# Patient Record
Sex: Female | Born: 1935
Health system: Southern US, Community
[De-identification: ages and names within clinical notes are randomized; demographics above are authoritative.]

## PROBLEM LIST (undated history)

## (undated) DIAGNOSIS — E876 Hypokalemia: Secondary | ICD-10-CM

## (undated) DIAGNOSIS — I2510 Atherosclerotic heart disease of native coronary artery without angina pectoris: Secondary | ICD-10-CM

## (undated) DIAGNOSIS — S2249XA Multiple fractures of ribs, unspecified side, initial encounter for closed fracture: Secondary | ICD-10-CM

## (undated) DIAGNOSIS — B029 Zoster without complications: Secondary | ICD-10-CM

## (undated) DIAGNOSIS — I259 Chronic ischemic heart disease, unspecified: Secondary | ICD-10-CM

## (undated) DIAGNOSIS — I071 Rheumatic tricuspid insufficiency: Secondary | ICD-10-CM

## (undated) DIAGNOSIS — E785 Hyperlipidemia, unspecified: Secondary | ICD-10-CM

## (undated) DIAGNOSIS — I6529 Occlusion and stenosis of unspecified carotid artery: Secondary | ICD-10-CM

## (undated) DIAGNOSIS — J439 Emphysema, unspecified: Secondary | ICD-10-CM

## (undated) DIAGNOSIS — F32A Depression, unspecified: Secondary | ICD-10-CM

## (undated) DIAGNOSIS — I4891 Unspecified atrial fibrillation: Secondary | ICD-10-CM

## (undated) DIAGNOSIS — R609 Edema, unspecified: Secondary | ICD-10-CM

## (undated) DIAGNOSIS — M2550 Pain in unspecified joint: Secondary | ICD-10-CM

## (undated) DIAGNOSIS — F419 Anxiety disorder, unspecified: Secondary | ICD-10-CM

## (undated) DIAGNOSIS — I872 Venous insufficiency (chronic) (peripheral): Secondary | ICD-10-CM

## (undated) DIAGNOSIS — D3910 Neoplasm of uncertain behavior of unspecified ovary: Secondary | ICD-10-CM

## (undated) DIAGNOSIS — R002 Palpitations: Secondary | ICD-10-CM

## (undated) DIAGNOSIS — I509 Heart failure, unspecified: Secondary | ICD-10-CM

## (undated) DIAGNOSIS — K8020 Calculus of gallbladder without cholecystitis without obstruction: Secondary | ICD-10-CM

## (undated) DIAGNOSIS — I998 Other disorder of circulatory system: Secondary | ICD-10-CM

## (undated) DIAGNOSIS — S2220XA Unspecified fracture of sternum, initial encounter for closed fracture: Secondary | ICD-10-CM

## (undated) DIAGNOSIS — M48061 Spinal stenosis, lumbar region without neurogenic claudication: Secondary | ICD-10-CM

## (undated) DIAGNOSIS — N183 Chronic kidney disease, stage 3 (moderate): Secondary | ICD-10-CM

## (undated) DIAGNOSIS — I4510 Unspecified right bundle-branch block: Secondary | ICD-10-CM

## (undated) DIAGNOSIS — K210 Gastro-esophageal reflux disease with esophagitis: Secondary | ICD-10-CM

## (undated) DIAGNOSIS — I4892 Unspecified atrial flutter: Secondary | ICD-10-CM

## (undated) DIAGNOSIS — Z85038 Personal history of other malignant neoplasm of large intestine: Secondary | ICD-10-CM

## (undated) DIAGNOSIS — IMO0002 Reserved for concepts with insufficient information to code with codable children: Secondary | ICD-10-CM

## (undated) DIAGNOSIS — F329 Major depressive disorder, single episode, unspecified: Secondary | ICD-10-CM

## (undated) DIAGNOSIS — I10 Essential (primary) hypertension: Secondary | ICD-10-CM

## (undated) DIAGNOSIS — H43813 Vitreous degeneration, bilateral: Secondary | ICD-10-CM

## (undated) DIAGNOSIS — Z9889 Other specified postprocedural states: Secondary | ICD-10-CM

## (undated) DIAGNOSIS — R0600 Dyspnea, unspecified: Secondary | ICD-10-CM

## (undated) DIAGNOSIS — J189 Pneumonia, unspecified organism: Secondary | ICD-10-CM

## (undated) DIAGNOSIS — K5900 Constipation, unspecified: Secondary | ICD-10-CM

## (undated) DIAGNOSIS — D6869 Other thrombophilia: Secondary | ICD-10-CM

## (undated) DIAGNOSIS — I5042 Chronic combined systolic (congestive) and diastolic (congestive) heart failure: Secondary | ICD-10-CM

## (undated) DIAGNOSIS — E039 Hypothyroidism, unspecified: Secondary | ICD-10-CM

## (undated) DIAGNOSIS — D509 Iron deficiency anemia, unspecified: Secondary | ICD-10-CM

## (undated) DIAGNOSIS — I209 Angina pectoris, unspecified: Secondary | ICD-10-CM

## (undated) DIAGNOSIS — M810 Age-related osteoporosis without current pathological fracture: Secondary | ICD-10-CM

## (undated) DIAGNOSIS — R269 Unspecified abnormalities of gait and mobility: Secondary | ICD-10-CM

## (undated) DIAGNOSIS — C50919 Malignant neoplasm of unspecified site of unspecified female breast: Secondary | ICD-10-CM

## (undated) DIAGNOSIS — Z8679 Personal history of other diseases of the circulatory system: Secondary | ICD-10-CM

## (undated) DIAGNOSIS — M1990 Unspecified osteoarthritis, unspecified site: Secondary | ICD-10-CM

## (undated) DIAGNOSIS — K559 Vascular disorder of intestine, unspecified: Secondary | ICD-10-CM

## (undated) DIAGNOSIS — J209 Acute bronchitis, unspecified: Secondary | ICD-10-CM

## (undated) DIAGNOSIS — I700 Atherosclerosis of aorta: Secondary | ICD-10-CM

## (undated) DIAGNOSIS — R5383 Other fatigue: Secondary | ICD-10-CM

## (undated) DIAGNOSIS — R739 Hyperglycemia, unspecified: Secondary | ICD-10-CM

## (undated) DIAGNOSIS — M1710 Unilateral primary osteoarthritis, unspecified knee: Secondary | ICD-10-CM

## (undated) HISTORY — DX: Other disorder of circulatory system: I99.8

## (undated) HISTORY — DX: Unilateral primary osteoarthritis, unspecified knee: M17.10

## (undated) HISTORY — DX: Spinal stenosis, lumbar region without neurogenic claudication: M48.061

## (undated) HISTORY — PX: COLECTOMY: SHX59

## (undated) HISTORY — DX: Personal history of other diseases of the circulatory system: Z86.79

## (undated) HISTORY — DX: Neoplasm of uncertain behavior of unspecified ovary: D39.10

## (undated) HISTORY — DX: Malignant neoplasm of unspecified site of unspecified female breast: C50.919

## (undated) HISTORY — DX: Personal history of other malignant neoplasm of large intestine: Z85.038

## (undated) HISTORY — DX: Chronic combined systolic (congestive) and diastolic (congestive) heart failure: I50.42

## (undated) HISTORY — DX: Depression, unspecified: F32.A

## (undated) HISTORY — DX: Other fatigue: R53.83

## (undated) HISTORY — DX: Anxiety disorder, unspecified: F41.9

## (undated) HISTORY — DX: Vitreous degeneration, bilateral: H43.813

## (undated) HISTORY — DX: Unspecified abnormalities of gait and mobility: R26.9

## (undated) HISTORY — DX: Essential (primary) hypertension: I10

## (undated) HISTORY — DX: Major depressive disorder, single episode, unspecified: F32.9

## (undated) HISTORY — DX: Venous insufficiency (chronic) (peripheral): I87.2

## (undated) HISTORY — DX: Unspecified right bundle-branch block: I45.10

## (undated) HISTORY — PX: BLADDER SURGERY: SHX569

## (undated) HISTORY — DX: Hyperglycemia, unspecified: R73.9

## (undated) HISTORY — DX: Heart failure, unspecified: I50.9

## (undated) HISTORY — DX: Atherosclerotic heart disease of native coronary artery without angina pectoris: I25.10

## (undated) HISTORY — DX: Pneumonia, unspecified organism: J18.9

## (undated) HISTORY — DX: Edema, unspecified: R60.9

## (undated) HISTORY — DX: Age-related osteoporosis without current pathological fracture: M81.0

## (undated) HISTORY — DX: Rheumatic tricuspid insufficiency: I07.1

## (undated) HISTORY — DX: Unspecified atrial fibrillation: I48.91

## (undated) HISTORY — PX: APPENDECTOMY: SHX54

## (undated) HISTORY — DX: Atherosclerosis of aorta: I70.0

## (undated) HISTORY — DX: Angina pectoris, unspecified: I20.9

## (undated) HISTORY — DX: Unspecified osteoarthritis, unspecified site: M19.90

## (undated) HISTORY — DX: Other specified postprocedural states: Z98.890

## (undated) HISTORY — DX: Other thrombophilia: D68.69

## (undated) HISTORY — DX: Dyspnea, unspecified: R06.00

## (undated) HISTORY — DX: Hypokalemia: E87.6

## (undated) HISTORY — DX: Unspecified fracture of sternum, initial encounter for closed fracture: S22.20XA

## (undated) HISTORY — DX: Hyperlipidemia, unspecified: E78.5

## (undated) HISTORY — DX: Emphysema, unspecified: J43.9

## (undated) HISTORY — DX: Zoster without complications: B02.9

## (undated) HISTORY — PX: TONSILLECTOMY: SUR1361

## (undated) HISTORY — PX: BREAST SURGERY: SHX581

## (undated) HISTORY — DX: Multiple fractures of ribs, unspecified side, initial encounter for closed fracture: S22.49XA

## (undated) HISTORY — DX: Hypothyroidism, unspecified: E03.9

## (undated) HISTORY — DX: Palpitations: R00.2

## (undated) HISTORY — DX: Iron deficiency anemia, unspecified: D50.9

## (undated) HISTORY — DX: Vascular disorder of intestine, unspecified: K55.9

## (undated) HISTORY — DX: Gastro-esophageal reflux disease with esophagitis: K21.0

## (undated) HISTORY — DX: Unspecified atrial flutter: I48.92

## (undated) HISTORY — DX: Occlusion and stenosis of unspecified carotid artery: I65.29

## (undated) HISTORY — DX: Reserved for concepts with insufficient information to code with codable children: IMO0002

## (undated) HISTORY — DX: Chronic ischemic heart disease, unspecified: I25.9

## (undated) HISTORY — DX: Acute bronchitis, unspecified: J20.9

## (undated) HISTORY — DX: Chronic kidney disease, stage 3 (moderate): N18.3

## (undated) HISTORY — DX: Calculus of gallbladder without cholecystitis without obstruction: K80.20

## (undated) HISTORY — DX: Constipation, unspecified: K59.00

## (undated) HISTORY — DX: Pain in unspecified joint: M25.50

---

## 1990-10-29 DIAGNOSIS — Z85038 Personal history of other malignant neoplasm of large intestine: Secondary | ICD-10-CM

## 1990-10-29 HISTORY — DX: Personal history of other malignant neoplasm of large intestine: Z85.038

## 1996-10-29 DIAGNOSIS — B029 Zoster without complications: Secondary | ICD-10-CM

## 1996-10-29 HISTORY — DX: Zoster without complications: B02.9

## 1998-07-12 ENCOUNTER — Other Ambulatory Visit: Admission: RE | Admit: 1998-07-12 | Discharge: 1998-07-12 | Payer: Self-pay | Admitting: Obstetrics and Gynecology

## 1998-08-16 ENCOUNTER — Ambulatory Visit (HOSPITAL_COMMUNITY): Admission: RE | Admit: 1998-08-16 | Discharge: 1998-08-16 | Payer: Self-pay | Admitting: Internal Medicine

## 1998-12-03 ENCOUNTER — Encounter: Payer: Self-pay | Admitting: Emergency Medicine

## 1998-12-04 ENCOUNTER — Inpatient Hospital Stay (HOSPITAL_COMMUNITY): Admission: EM | Admit: 1998-12-04 | Discharge: 1998-12-09 | Payer: Self-pay | Admitting: Emergency Medicine

## 1998-12-04 ENCOUNTER — Encounter: Payer: Self-pay | Admitting: Surgery

## 1998-12-04 ENCOUNTER — Encounter: Payer: Self-pay | Admitting: Urology

## 1998-12-07 ENCOUNTER — Encounter: Payer: Self-pay | Admitting: Surgery

## 1998-12-09 ENCOUNTER — Inpatient Hospital Stay (HOSPITAL_COMMUNITY)
Admission: RE | Admit: 1998-12-09 | Discharge: 1998-12-23 | Payer: Self-pay | Admitting: Physical Medicine and Rehabilitation

## 1998-12-11 ENCOUNTER — Encounter: Payer: Self-pay | Admitting: Physical Medicine and Rehabilitation

## 1998-12-14 ENCOUNTER — Encounter: Payer: Self-pay | Admitting: Physical Medicine and Rehabilitation

## 1999-02-27 ENCOUNTER — Encounter
Admission: RE | Admit: 1999-02-27 | Discharge: 1999-05-29 | Payer: Self-pay | Admitting: Physical Medicine and Rehabilitation

## 1999-04-26 ENCOUNTER — Encounter: Admission: RE | Admit: 1999-04-26 | Discharge: 1999-05-29 | Payer: Self-pay | Admitting: Orthopedic Surgery

## 1999-05-31 ENCOUNTER — Encounter: Admission: RE | Admit: 1999-05-31 | Discharge: 1999-06-16 | Payer: Self-pay | Admitting: Orthopedic Surgery

## 1999-07-14 ENCOUNTER — Other Ambulatory Visit: Admission: RE | Admit: 1999-07-14 | Discharge: 1999-07-14 | Payer: Self-pay | Admitting: Obstetrics and Gynecology

## 1999-12-26 ENCOUNTER — Ambulatory Visit (HOSPITAL_COMMUNITY): Admission: RE | Admit: 1999-12-26 | Discharge: 1999-12-26 | Payer: Self-pay | Admitting: *Deleted

## 2000-03-22 ENCOUNTER — Ambulatory Visit (HOSPITAL_COMMUNITY): Admission: RE | Admit: 2000-03-22 | Discharge: 2000-03-22 | Payer: Self-pay | Admitting: Gastroenterology

## 2000-10-10 ENCOUNTER — Other Ambulatory Visit: Admission: RE | Admit: 2000-10-10 | Discharge: 2000-10-10 | Payer: Self-pay | Admitting: Internal Medicine

## 2001-08-18 ENCOUNTER — Other Ambulatory Visit: Admission: RE | Admit: 2001-08-18 | Discharge: 2001-08-18 | Payer: Self-pay | Admitting: Obstetrics and Gynecology

## 2002-10-14 ENCOUNTER — Other Ambulatory Visit: Admission: RE | Admit: 2002-10-14 | Discharge: 2002-10-14 | Payer: Self-pay | Admitting: Gynecology

## 2002-10-29 DIAGNOSIS — C50919 Malignant neoplasm of unspecified site of unspecified female breast: Secondary | ICD-10-CM

## 2002-10-29 HISTORY — DX: Malignant neoplasm of unspecified site of unspecified female breast: C50.919

## 2002-11-26 ENCOUNTER — Encounter (INDEPENDENT_AMBULATORY_CARE_PROVIDER_SITE_OTHER): Payer: Self-pay | Admitting: Specialist

## 2002-11-26 ENCOUNTER — Encounter: Payer: Self-pay | Admitting: Gynecology

## 2002-11-26 ENCOUNTER — Encounter: Admission: RE | Admit: 2002-11-26 | Discharge: 2002-11-26 | Payer: Self-pay | Admitting: Gynecology

## 2002-12-01 ENCOUNTER — Encounter: Payer: Self-pay | Admitting: Surgery

## 2002-12-01 ENCOUNTER — Ambulatory Visit (HOSPITAL_COMMUNITY): Admission: RE | Admit: 2002-12-01 | Discharge: 2002-12-01 | Payer: Self-pay | Admitting: Surgery

## 2002-12-04 ENCOUNTER — Ambulatory Visit (HOSPITAL_COMMUNITY): Admission: RE | Admit: 2002-12-04 | Discharge: 2002-12-04 | Payer: Self-pay | Admitting: Surgery

## 2002-12-04 ENCOUNTER — Encounter: Payer: Self-pay | Admitting: Surgery

## 2002-12-07 ENCOUNTER — Encounter: Payer: Self-pay | Admitting: Surgery

## 2002-12-07 ENCOUNTER — Encounter: Admission: RE | Admit: 2002-12-07 | Discharge: 2002-12-07 | Payer: Self-pay | Admitting: Surgery

## 2002-12-07 ENCOUNTER — Ambulatory Visit (HOSPITAL_BASED_OUTPATIENT_CLINIC_OR_DEPARTMENT_OTHER): Admission: RE | Admit: 2002-12-07 | Discharge: 2002-12-08 | Payer: Self-pay | Admitting: Surgery

## 2002-12-07 ENCOUNTER — Encounter (INDEPENDENT_AMBULATORY_CARE_PROVIDER_SITE_OTHER): Payer: Self-pay | Admitting: *Deleted

## 2002-12-15 ENCOUNTER — Ambulatory Visit: Admission: RE | Admit: 2002-12-15 | Discharge: 2003-03-15 | Payer: Self-pay | Admitting: Radiation Oncology

## 2003-01-18 ENCOUNTER — Encounter: Admission: RE | Admit: 2003-01-18 | Discharge: 2003-01-18 | Payer: Self-pay | Admitting: Radiation Oncology

## 2003-06-15 ENCOUNTER — Ambulatory Visit (HOSPITAL_COMMUNITY): Admission: RE | Admit: 2003-06-15 | Discharge: 2003-06-15 | Payer: Self-pay | Admitting: Otolaryngology

## 2003-07-28 ENCOUNTER — Encounter: Payer: Self-pay | Admitting: Surgery

## 2003-07-28 ENCOUNTER — Encounter: Admission: RE | Admit: 2003-07-28 | Discharge: 2003-07-28 | Payer: Self-pay | Admitting: Surgery

## 2003-11-29 ENCOUNTER — Encounter: Admission: RE | Admit: 2003-11-29 | Discharge: 2003-11-29 | Payer: Self-pay | Admitting: Oncology

## 2004-08-17 ENCOUNTER — Encounter: Admission: RE | Admit: 2004-08-17 | Discharge: 2004-08-17 | Payer: Self-pay | Admitting: Surgery

## 2004-08-21 ENCOUNTER — Ambulatory Visit (HOSPITAL_BASED_OUTPATIENT_CLINIC_OR_DEPARTMENT_OTHER): Admission: RE | Admit: 2004-08-21 | Discharge: 2004-08-21 | Payer: Self-pay | Admitting: Surgery

## 2004-08-21 ENCOUNTER — Ambulatory Visit (HOSPITAL_COMMUNITY): Admission: RE | Admit: 2004-08-21 | Discharge: 2004-08-21 | Payer: Self-pay | Admitting: Surgery

## 2004-08-21 ENCOUNTER — Encounter (INDEPENDENT_AMBULATORY_CARE_PROVIDER_SITE_OTHER): Payer: Self-pay | Admitting: *Deleted

## 2004-08-30 ENCOUNTER — Inpatient Hospital Stay (HOSPITAL_COMMUNITY): Admission: AD | Admit: 2004-08-30 | Discharge: 2004-09-03 | Payer: Self-pay | Admitting: Cardiology

## 2004-08-31 ENCOUNTER — Encounter: Payer: Self-pay | Admitting: Cardiology

## 2004-10-26 ENCOUNTER — Other Ambulatory Visit: Admission: RE | Admit: 2004-10-26 | Discharge: 2004-10-26 | Payer: Self-pay | Admitting: Gynecology

## 2004-11-30 ENCOUNTER — Encounter: Admission: RE | Admit: 2004-11-30 | Discharge: 2004-11-30 | Payer: Self-pay | Admitting: Oncology

## 2005-01-17 ENCOUNTER — Ambulatory Visit: Payer: Self-pay | Admitting: Oncology

## 2005-03-20 ENCOUNTER — Ambulatory Visit (HOSPITAL_COMMUNITY): Admission: RE | Admit: 2005-03-20 | Discharge: 2005-03-20 | Payer: Self-pay | Admitting: Cardiology

## 2005-07-16 ENCOUNTER — Ambulatory Visit: Payer: Self-pay | Admitting: Oncology

## 2005-10-05 ENCOUNTER — Encounter: Admission: RE | Admit: 2005-10-05 | Discharge: 2006-01-03 | Payer: Self-pay | Admitting: Psychology

## 2005-10-05 ENCOUNTER — Ambulatory Visit: Payer: Self-pay | Admitting: Psychology

## 2005-12-03 ENCOUNTER — Encounter: Admission: RE | Admit: 2005-12-03 | Discharge: 2005-12-03 | Payer: Self-pay | Admitting: Oncology

## 2005-12-07 ENCOUNTER — Ambulatory Visit: Payer: Self-pay | Admitting: Oncology

## 2006-03-13 ENCOUNTER — Ambulatory Visit: Payer: Self-pay | Admitting: Psychology

## 2006-03-14 ENCOUNTER — Encounter: Admission: RE | Admit: 2006-03-14 | Discharge: 2006-06-12 | Payer: Self-pay | Admitting: Psychology

## 2006-03-14 ENCOUNTER — Encounter: Admission: RE | Admit: 2006-03-14 | Discharge: 2006-03-14 | Payer: Self-pay | Admitting: Cardiology

## 2006-03-27 ENCOUNTER — Ambulatory Visit (HOSPITAL_COMMUNITY): Admission: RE | Admit: 2006-03-27 | Discharge: 2006-03-27 | Payer: Self-pay | Admitting: Cardiology

## 2006-07-03 ENCOUNTER — Ambulatory Visit (HOSPITAL_COMMUNITY): Admission: RE | Admit: 2006-07-03 | Discharge: 2006-07-03 | Payer: Self-pay | Admitting: Cardiology

## 2006-07-31 ENCOUNTER — Encounter: Admission: RE | Admit: 2006-07-31 | Discharge: 2006-10-29 | Payer: Self-pay | Admitting: Psychology

## 2006-07-31 ENCOUNTER — Ambulatory Visit: Payer: Self-pay | Admitting: Psychology

## 2006-08-07 ENCOUNTER — Ambulatory Visit: Payer: Self-pay | Admitting: Internal Medicine

## 2006-08-08 ENCOUNTER — Ambulatory Visit: Payer: Self-pay | Admitting: Internal Medicine

## 2006-08-08 ENCOUNTER — Inpatient Hospital Stay (HOSPITAL_COMMUNITY): Admission: RE | Admit: 2006-08-08 | Discharge: 2006-08-15 | Payer: Self-pay | Admitting: Internal Medicine

## 2006-08-09 ENCOUNTER — Encounter (INDEPENDENT_AMBULATORY_CARE_PROVIDER_SITE_OTHER): Payer: Self-pay | Admitting: *Deleted

## 2006-08-27 ENCOUNTER — Ambulatory Visit: Payer: Self-pay | Admitting: Internal Medicine

## 2006-09-24 ENCOUNTER — Ambulatory Visit: Payer: Self-pay | Admitting: Internal Medicine

## 2006-10-29 DIAGNOSIS — M48061 Spinal stenosis, lumbar region without neurogenic claudication: Secondary | ICD-10-CM

## 2006-10-29 DIAGNOSIS — M1990 Unspecified osteoarthritis, unspecified site: Secondary | ICD-10-CM

## 2006-10-29 DIAGNOSIS — F32A Depression, unspecified: Secondary | ICD-10-CM

## 2006-10-29 DIAGNOSIS — E039 Hypothyroidism, unspecified: Secondary | ICD-10-CM

## 2006-10-29 HISTORY — DX: Spinal stenosis, lumbar region without neurogenic claudication: M48.061

## 2006-10-29 HISTORY — DX: Depression, unspecified: F32.A

## 2006-10-29 HISTORY — DX: Unspecified osteoarthritis, unspecified site: M19.90

## 2006-10-29 HISTORY — PX: FEMORAL ARTERY REPAIR: SHX1582

## 2006-10-29 HISTORY — DX: Hypothyroidism, unspecified: E03.9

## 2006-11-12 ENCOUNTER — Other Ambulatory Visit: Admission: RE | Admit: 2006-11-12 | Discharge: 2006-11-12 | Payer: Self-pay | Admitting: Gynecology

## 2006-12-05 ENCOUNTER — Ambulatory Visit: Payer: Self-pay | Admitting: Oncology

## 2006-12-05 ENCOUNTER — Encounter: Admission: RE | Admit: 2006-12-05 | Discharge: 2006-12-05 | Payer: Self-pay | Admitting: Oncology

## 2006-12-10 LAB — CBC WITH DIFFERENTIAL/PLATELET
Basophils Absolute: 0.1 10*3/uL (ref 0.0–0.1)
EOS%: 4.9 % (ref 0.0–7.0)
Eosinophils Absolute: 0.4 10*3/uL (ref 0.0–0.5)
HCT: 36.5 % (ref 34.8–46.6)
HGB: 13 g/dL (ref 11.6–15.9)
MCH: 32.9 pg (ref 26.0–34.0)
MCV: 92.2 fL (ref 81.0–101.0)
MONO%: 7.5 % (ref 0.0–13.0)
NEUT#: 4.9 10*3/uL (ref 1.5–6.5)
NEUT%: 64.8 % (ref 39.6–76.8)
lymph#: 1.6 10*3/uL (ref 0.9–3.3)

## 2006-12-10 LAB — COMPREHENSIVE METABOLIC PANEL
AST: 15 U/L (ref 0–37)
Albumin: 4.2 g/dL (ref 3.5–5.2)
BUN: 23 mg/dL (ref 6–23)
Calcium: 10.2 mg/dL (ref 8.4–10.5)
Chloride: 102 mEq/L (ref 96–112)
Creatinine, Ser: 1.08 mg/dL (ref 0.40–1.20)
Glucose, Bld: 89 mg/dL (ref 70–99)

## 2006-12-10 LAB — CEA: CEA: 1.1 ng/mL (ref 0.0–5.0)

## 2007-03-18 ENCOUNTER — Ambulatory Visit: Payer: Self-pay | Admitting: Psychology

## 2007-05-16 ENCOUNTER — Ambulatory Visit: Payer: Self-pay | Admitting: Vascular Surgery

## 2007-08-21 ENCOUNTER — Encounter: Admission: RE | Admit: 2007-08-21 | Discharge: 2007-08-21 | Payer: Self-pay | Admitting: Sports Medicine

## 2007-12-05 ENCOUNTER — Ambulatory Visit: Payer: Self-pay | Admitting: Oncology

## 2007-12-09 LAB — COMPREHENSIVE METABOLIC PANEL
ALT: 19 U/L (ref 0–35)
AST: 18 U/L (ref 0–37)
BUN: 33 mg/dL — ABNORMAL HIGH (ref 6–23)
Creatinine, Ser: 1.08 mg/dL (ref 0.40–1.20)
Total Bilirubin: 0.8 mg/dL (ref 0.3–1.2)

## 2007-12-09 LAB — CBC WITH DIFFERENTIAL/PLATELET
BASO%: 0.2 % (ref 0.0–2.0)
EOS%: 1.7 % (ref 0.0–7.0)
HCT: 35.1 % (ref 34.8–46.6)
LYMPH%: 11.6 % — ABNORMAL LOW (ref 14.0–48.0)
MCH: 33.8 pg (ref 26.0–34.0)
MCHC: 34.8 g/dL (ref 32.0–36.0)
MCV: 97.1 fL (ref 81.0–101.0)
MONO%: 4.4 % (ref 0.0–13.0)
NEUT%: 82.1 % — ABNORMAL HIGH (ref 39.6–76.8)
Platelets: 220 10*3/uL (ref 145–400)

## 2007-12-10 ENCOUNTER — Encounter: Admission: RE | Admit: 2007-12-10 | Discharge: 2007-12-10 | Payer: Self-pay | Admitting: Oncology

## 2007-12-16 ENCOUNTER — Encounter: Payer: Self-pay | Admitting: Cardiology

## 2007-12-16 ENCOUNTER — Ambulatory Visit: Payer: Self-pay | Admitting: *Deleted

## 2008-01-12 LAB — CBC WITH DIFFERENTIAL/PLATELET
BASO%: 1.1 % (ref 0.0–2.0)
LYMPH%: 15.5 % (ref 14.0–48.0)
MCHC: 34.3 g/dL (ref 32.0–36.0)
MONO#: 0.4 10*3/uL (ref 0.1–0.9)
Platelets: 263 10*3/uL (ref 145–400)
RBC: 3.68 10*6/uL — ABNORMAL LOW (ref 3.70–5.32)
WBC: 7.8 10*3/uL (ref 3.9–10.0)
lymph#: 1.2 10*3/uL (ref 0.9–3.3)

## 2008-01-12 LAB — MORPHOLOGY: PLT EST: ADEQUATE

## 2008-01-14 LAB — FERRITIN: Ferritin: 42 ng/mL (ref 10–291)

## 2008-01-14 LAB — IRON AND TIBC: TIBC: 354 ug/dL (ref 250–470)

## 2008-01-14 LAB — IMMUNOFIXATION ELECTROPHORESIS
IgG (Immunoglobin G), Serum: 514 mg/dL — ABNORMAL LOW (ref 694–1618)
Total Protein, Serum Electrophoresis: 6.4 g/dL (ref 6.0–8.3)

## 2008-02-27 ENCOUNTER — Encounter: Admission: RE | Admit: 2008-02-27 | Discharge: 2008-02-27 | Payer: Self-pay | Admitting: Neurology

## 2008-06-16 ENCOUNTER — Encounter: Admission: RE | Admit: 2008-06-16 | Discharge: 2008-09-14 | Payer: Self-pay | Admitting: Psychology

## 2008-06-16 ENCOUNTER — Ambulatory Visit: Payer: Self-pay | Admitting: Psychology

## 2008-07-27 ENCOUNTER — Ambulatory Visit: Payer: Self-pay | Admitting: Psychology

## 2008-09-15 ENCOUNTER — Ambulatory Visit: Payer: Self-pay | Admitting: Sports Medicine

## 2008-09-15 DIAGNOSIS — G8929 Other chronic pain: Secondary | ICD-10-CM | POA: Insufficient documentation

## 2008-09-15 DIAGNOSIS — M25569 Pain in unspecified knee: Secondary | ICD-10-CM

## 2008-10-06 ENCOUNTER — Ambulatory Visit: Payer: Self-pay | Admitting: Sports Medicine

## 2008-10-13 ENCOUNTER — Ambulatory Visit: Payer: Self-pay | Admitting: Psychology

## 2008-10-27 ENCOUNTER — Encounter: Payer: Self-pay | Admitting: Sports Medicine

## 2008-10-29 DIAGNOSIS — R609 Edema, unspecified: Secondary | ICD-10-CM

## 2008-10-29 DIAGNOSIS — I872 Venous insufficiency (chronic) (peripheral): Secondary | ICD-10-CM

## 2008-10-29 DIAGNOSIS — I6529 Occlusion and stenosis of unspecified carotid artery: Secondary | ICD-10-CM

## 2008-10-29 DIAGNOSIS — I4510 Unspecified right bundle-branch block: Secondary | ICD-10-CM

## 2008-10-29 HISTORY — DX: Venous insufficiency (chronic) (peripheral): I87.2

## 2008-10-29 HISTORY — DX: Occlusion and stenosis of unspecified carotid artery: I65.29

## 2008-10-29 HISTORY — DX: Unspecified right bundle-branch block: I45.10

## 2008-10-29 HISTORY — PX: CARDIAC CATHETERIZATION: SHX172

## 2008-10-29 HISTORY — DX: Edema, unspecified: R60.9

## 2008-11-18 ENCOUNTER — Telehealth: Payer: Self-pay | Admitting: Sports Medicine

## 2008-11-18 ENCOUNTER — Encounter: Admission: RE | Admit: 2008-11-18 | Discharge: 2008-11-18 | Payer: Self-pay | Admitting: Cardiology

## 2008-12-01 ENCOUNTER — Encounter: Admission: RE | Admit: 2008-12-01 | Discharge: 2009-02-23 | Payer: Self-pay | Admitting: Sports Medicine

## 2008-12-03 ENCOUNTER — Ambulatory Visit: Payer: Self-pay | Admitting: Oncology

## 2008-12-07 LAB — COMPREHENSIVE METABOLIC PANEL
Albumin: 3.7 g/dL (ref 3.5–5.2)
BUN: 18 mg/dL (ref 6–23)
CO2: 28 mEq/L (ref 19–32)
Calcium: 9.7 mg/dL (ref 8.4–10.5)
Chloride: 103 mEq/L (ref 96–112)
Glucose, Bld: 120 mg/dL — ABNORMAL HIGH (ref 70–99)
Potassium: 3.8 mEq/L (ref 3.5–5.3)

## 2008-12-07 LAB — CEA: CEA: 1.1 ng/mL (ref 0.0–5.0)

## 2008-12-07 LAB — CBC WITH DIFFERENTIAL/PLATELET
Basophils Absolute: 0 10*3/uL (ref 0.0–0.1)
Eosinophils Absolute: 0.3 10*3/uL (ref 0.0–0.5)
HGB: 12.2 g/dL (ref 11.6–15.9)
NEUT#: 3.9 10*3/uL (ref 1.5–6.5)
RDW: 14.2 % (ref 11.3–14.5)
lymph#: 1.4 10*3/uL (ref 0.9–3.3)

## 2008-12-10 ENCOUNTER — Encounter: Admission: RE | Admit: 2008-12-10 | Discharge: 2008-12-10 | Payer: Self-pay | Admitting: Oncology

## 2008-12-15 ENCOUNTER — Ambulatory Visit: Payer: Self-pay | Admitting: Sports Medicine

## 2008-12-15 DIAGNOSIS — R269 Unspecified abnormalities of gait and mobility: Secondary | ICD-10-CM

## 2008-12-15 DIAGNOSIS — M1710 Unilateral primary osteoarthritis, unspecified knee: Secondary | ICD-10-CM

## 2008-12-15 DIAGNOSIS — M2140 Flat foot [pes planus] (acquired), unspecified foot: Secondary | ICD-10-CM | POA: Insufficient documentation

## 2009-02-02 ENCOUNTER — Encounter: Payer: Self-pay | Admitting: Sports Medicine

## 2009-02-09 ENCOUNTER — Encounter: Payer: Self-pay | Admitting: Sports Medicine

## 2009-04-14 ENCOUNTER — Encounter: Payer: Self-pay | Admitting: Sports Medicine

## 2009-04-17 ENCOUNTER — Ambulatory Visit: Payer: Self-pay | Admitting: *Deleted

## 2009-04-17 ENCOUNTER — Inpatient Hospital Stay (HOSPITAL_COMMUNITY): Admission: EM | Admit: 2009-04-17 | Discharge: 2009-04-20 | Payer: Self-pay | Admitting: Emergency Medicine

## 2009-06-22 ENCOUNTER — Ambulatory Visit: Payer: Self-pay | Admitting: Sports Medicine

## 2009-07-13 ENCOUNTER — Encounter: Payer: Self-pay | Admitting: Sports Medicine

## 2009-08-15 ENCOUNTER — Ambulatory Visit: Payer: Self-pay | Admitting: Sports Medicine

## 2009-08-15 DIAGNOSIS — M461 Sacroiliitis, not elsewhere classified: Secondary | ICD-10-CM | POA: Insufficient documentation

## 2009-08-17 ENCOUNTER — Encounter: Payer: Self-pay | Admitting: Sports Medicine

## 2009-10-29 DIAGNOSIS — F419 Anxiety disorder, unspecified: Secondary | ICD-10-CM

## 2009-10-29 HISTORY — DX: Anxiety disorder, unspecified: F41.9

## 2009-12-05 ENCOUNTER — Encounter: Payer: Self-pay | Admitting: Sports Medicine

## 2009-12-05 ENCOUNTER — Ambulatory Visit: Payer: Self-pay | Admitting: Sports Medicine

## 2009-12-05 DIAGNOSIS — M5430 Sciatica, unspecified side: Secondary | ICD-10-CM | POA: Insufficient documentation

## 2009-12-08 ENCOUNTER — Ambulatory Visit: Payer: Self-pay | Admitting: Oncology

## 2009-12-12 ENCOUNTER — Encounter: Admission: RE | Admit: 2009-12-12 | Discharge: 2009-12-12 | Payer: Self-pay | Admitting: Oncology

## 2009-12-12 LAB — CBC WITH DIFFERENTIAL/PLATELET
BASO%: 0.4 % (ref 0.0–2.0)
Basophils Absolute: 0 10*3/uL (ref 0.0–0.1)
EOS%: 2.3 % (ref 0.0–7.0)
HCT: 41.4 % (ref 34.8–46.6)
HGB: 13.8 g/dL (ref 11.6–15.9)
MCH: 31.9 pg (ref 25.1–34.0)
MCHC: 33.3 g/dL (ref 31.5–36.0)
MCV: 95.8 fL (ref 79.5–101.0)
MONO%: 7.3 % (ref 0.0–14.0)
NEUT%: 73.9 % (ref 38.4–76.8)
lymph#: 1.7 10*3/uL (ref 0.9–3.3)

## 2009-12-12 LAB — COMPREHENSIVE METABOLIC PANEL
ALT: 20 U/L (ref 0–35)
AST: 17 U/L (ref 0–37)
Alkaline Phosphatase: 55 U/L (ref 39–117)
BUN: 23 mg/dL (ref 6–23)
Creatinine, Ser: 1.17 mg/dL (ref 0.40–1.20)
Total Bilirubin: 1.1 mg/dL (ref 0.3–1.2)

## 2010-03-23 ENCOUNTER — Ambulatory Visit: Payer: Self-pay | Admitting: Sports Medicine

## 2010-06-29 ENCOUNTER — Ambulatory Visit: Payer: Self-pay | Admitting: Sports Medicine

## 2010-06-29 DIAGNOSIS — M76899 Other specified enthesopathies of unspecified lower limb, excluding foot: Secondary | ICD-10-CM | POA: Insufficient documentation

## 2010-06-29 DIAGNOSIS — M25559 Pain in unspecified hip: Secondary | ICD-10-CM | POA: Insufficient documentation

## 2010-08-11 ENCOUNTER — Emergency Department (HOSPITAL_COMMUNITY): Admission: EM | Admit: 2010-08-11 | Discharge: 2010-08-11 | Payer: Self-pay | Admitting: Family Medicine

## 2010-09-05 ENCOUNTER — Ambulatory Visit: Payer: Self-pay | Admitting: Cardiology

## 2010-11-18 ENCOUNTER — Other Ambulatory Visit: Payer: Self-pay | Admitting: Oncology

## 2010-11-18 DIAGNOSIS — Z1231 Encounter for screening mammogram for malignant neoplasm of breast: Secondary | ICD-10-CM

## 2010-11-28 NOTE — Assessment & Plan Note (Signed)
Summary: FU KNEE AND HIP PAIN   Vital Signs:  Patient profile:   77 year old female BP sitting:   90 / 56  CC:  f/u R knee and L hip pain.  History of Present Illness: 77 y/o female with chronic bilateral OA (right with more pain than left), chronic LE edema, genu valgum, medial arch collapse presents for follow up of R knee pain and left hip pain. She has completed 2 terms of PT, and "has graduated from balance school." She now uses a single point cane most of the time, still uses her walker for long distances. received steroid injection to R knee at last visit and noted great improvement. pain and swelling just recently returned and worsened.   has h/o lumbar stenosis and sciatica. previously received lumbar injections at [facility]. On lyrica and vicodin as needed for OA pain.  Current Medications (verified): 1)  Synthroid 75 Mcg Tabs (Levothyroxine Sodium) 2)  Wellbutrin Xl 300 Mg Xr24h-Tab (Bupropion Hcl) 3)  Lasix 20 Mg Tabs (Furosemide) 4)  Lanoxin 0.25 Mg  Tabs (Digoxin) .Marland Kitchen.. 1 By Mouth Daily 5)  Alprazolam 0.25 Mg Tbdp (Alprazolam) 6)  Hydrocodone-Acetaminophen 10-660 Mg Tabs (Hydrocodone-Acetaminophen) .... 1/2 To 1 Full Tab By Mouth Q6-8 Hours As Needed For Pain. 7)  Coreg 12.5 Mg Tabs (Carvedilol) 8)  Lyrica 50 Mg Caps (Pregabalin) 9)  Fosamax 70 Mg Tabs (Alendronate Sodium)  Allergies (verified): No Known Drug Allergies  Physical Exam  General:  Well-developed, well-nourished,i n no acute distress; alert, appropriate and cooperative throughout examination. Vital signs reviewed. Msk:  Hips - pain on palpation of left SI joint.  + FABER on left.  + trendelenburg with gait. Tender to palp over piriformis and over upper hs  Knees - + Genu valgus with med compt collapse on RT > LT.  significant effusion noted on RT Ankles chronic swelling but stable to standing.      Impression & Recommendations:  Problem # 1:  KNEE PAIN, RIGHT (ICD-719.46)  Her  updated medication list for this problem includes:    Hydrocodone-acetaminophen 10-660 Mg Tabs (Hydrocodone-acetaminophen) .Marland Kitchen... 1/2 to 1 full tab by mouth q6-8 hours as needed for pain.  This has recurred got 4 mos relief with injection  Orders: Joint Aspirate / Injection, Large (20610) Kenalog 10 mg inj (K7425)  Problem # 2:  DEGENERATIVE JOINT DISEASE, KNEES, BILATERAL (ICD-715.96)  Her updated medication list for this problem includes:    Hydrocodone-acetaminophen 10-660 Mg Tabs (Hydrocodone-acetaminophen) .Marland Kitchen... 1/2 to 1 full tab by mouth q6-8 hours as needed for pain.  RT knee  cleanse with alcohol Topical analgesic spray : Ethyl choride Joint RT knee  Approached in typical fashion with US guidance which shows a large suprapatellar pouch effusion;  the injection guided into pouch and Completed without difficulty Meds:kenalog 40 1 cc and 4 ccs xylocaine Needle:25 gauge 1.5 inch Aftercare instructions and Red flags advised  Orders: Joint Aspirate / Injection, Large (20610) Kenalog 10 mg inj (J3301)  Problem # 3:  SCIATICA, LEFT (ICD-724.3)  Her updated medication list for this problem includes:    Hydrocodone-acetaminophen 10-660 Mg Tabs (Hydrocodone-acetaminophen) .Marland Kitchen... 1/2 to 1 full tab by mouth q6-8 hours as needed for pain.   cleanse with alcohol Topical analgesic spray : Ethyl choride Injection to sciatic notch area Approached in typical fashion with:post palpation Completed without difficulty Meds: 1cc kenalog 40 + 4 ccs xlylocaine Needle:25 g and 1.5 in Aftercare instructions and Red flags advised  If this  strategy works for pain relief complete Q 3 os or so.  Otherwise may have to return to transforaminal injections at PMR  Orders: Trigger Point Injection (1 or 2 muscles) (29562) Kenalog 10 mg inj (J3301)  Complete Medication List: 1)  Synthroid 75 Mcg Tabs (Levothyroxine sodium) 2)  Wellbutrin Xl 300 Mg Xr24h-tab (Bupropion hcl) 3)  Lasix 20 Mg Tabs  (Furosemide) 4)  Lanoxin 0.25 Mg Tabs (Digoxin) .Marland Kitchen.. 1 by mouth daily 5)  Alprazolam 0.25 Mg Tbdp (Alprazolam) 6)  Hydrocodone-acetaminophen 10-660 Mg Tabs (Hydrocodone-acetaminophen) .... 1/2 to 1 full tab by mouth q6-8 hours as needed for pain. 7)  Coreg 12.5 Mg Tabs (Carvedilol) 8)  Lyrica 50 Mg Caps (Pregabalin) 9)  Fosamax 70 Mg Tabs (Alendronate sodium) Prescriptions: HYDROCODONE-ACETAMINOPHEN 10-660 MG TABS (HYDROCODONE-ACETAMINOPHEN) 1/2 to 1 full tab by mouth q6-8 hours as needed for pain.  #90 x 1   Entered and Authorized by:   Lequita Asal  MD   Signed by:   Lequita Asal  MD on 12/05/2009   Method used:   Telephoned to ...       [facility]* (retail)       [address]       [place], [place]  [id number]       Ph: [phone]       Fax: [phone]   RxID:   [id number]

## 2010-11-28 NOTE — Assessment & Plan Note (Signed)
Summary: F/U [facility]   History of Present Illness: Pt presents for right knee pain and effusion which started on 03/23/2010. She was walking more than usual and felt as if her right knee tried to give out on her yesterday. She has had multiple steroid injections in the past and is requesting another one for pain control. She typically takes 1/2 tablet of hydrocodone when she is having severe pain. She has noticed that her right knee has become much more swollen than usual. She usually has to wear above the knee compression stockings for edema.   Allergies: No Known Drug Allergies  Physical Exam  General:  alert and well-developed.   Head:  normocephalic and atraumatic.   Mouth:  MMM Neck:  supple.   Lungs:  normal respiratory effort.   Msk:  Right Knee: Edema of lower extremitiy but also has a moderate knee effusion No bruising or bony abnormalities Flexion to 100 degrees but painful + flexion pinch Lacks 5 degrees of flexion Normal ACL, PCL, MCL and LCL with special testing + McMurray's, + extended leg bounce test Mild pain with patellar compression Neg patellar aprehension test  Left Knee: Lower extremity edema Mild effusion, no bony abnormalities or bruising Full flexion and extension without pain Normal MCL, LCL, ACL, and PCL Neg McMurray's, Neg extended leg bounce test Mild pain with patellar compression Neg patellar aprehension test Additional Exam:  U/S of her right knee shows a suprapatellar effusion. The ultrasound was used to aspirate 10 cc of synovial fluid without any crystals or blood. Kenalog and lidocaine was also injected successfully under U/S guidance. Images saved for documenation.    Impression & Recommendations:  Problem # 1:  KNEE PAIN, RIGHT (ICD-719.46)  1. Injected the patient's right knee under ultrasound guidance  Consent obtained and verified. Sterile betadine prep. Furthur cleansed with alcohol. Topical analgesic spray: Ethyl chloride. Joint:  Right knee Approached in typical fashion with: ultrasound guidance, lateral anterior approach. Removed 10 cc of synovial fluid initially and then injected medication.  Completed without difficulty Meds: 40mg  of kenalog, 6 cc of 1% lidocaine Needle: 18 gauge Aftercare instructions and Red flags advised.  2. Continue compression stockings and hydrocodone as needed 3. Continue cane use 4. Return in 1 month as needed or return sooner if problems   Her updated medication list for this problem includes:    Hydrocodone-acetaminophen 10-660 Mg Tabs (Hydrocodone-acetaminophen) .Marland Kitchen... 1/2 to 1 full tab by mouth q6-8 hours as needed for pain.  Orders: [place] LIMITED (14782) Joint Aspirate / Injection, Large (95621) Kenalog 10 mg inj (H0865)  Problem # 2:  UNSTEADY GAIT (ICD-781.2) Continue cane Consider PT for knee pain if not improving  Complete Medication List: 1)  Synthroid 75 Mcg Tabs (Levothyroxine sodium) 2)  Wellbutrin Xl 300 Mg Xr24h-tab (Bupropion hcl) 3)  Lasix 20 Mg Tabs (Furosemide) 4)  Lanoxin 0.25 Mg Tabs (Digoxin) .Marland Kitchen.. 1 by mouth daily 5)  Alprazolam 0.25 Mg Tbdp (Alprazolam) 6)  Hydrocodone-acetaminophen 10-660 Mg Tabs (Hydrocodone-acetaminophen) .... 1/2 to 1 full tab by mouth q6-8 hours as needed for pain. 7)  Coreg 12.5 Mg Tabs (Carvedilol) 8)  Lyrica 50 Mg Caps (Pregabalin) 9)  Fosamax 70 Mg Tabs (Alendronate sodium)

## 2010-11-28 NOTE — Assessment & Plan Note (Signed)
Summary: HIP AND KNEE PAIN/[facility]JD   Vital Signs:  Patient profile:   77 year old female BP sitting:   111 / 57  Vitals Entered By: Lillia Pauls CMA (June 29, 2010 10:07 AM)  History of Present Illness: 77yo female to ofice for f/u of R knee & L hip pain. R knee injected 03/23/10 & got 60% improvement in symptoms.  Mult. steroid injections in past - thinks about 3 in the last year. Knee overall improved & has minimal pain as long as taking hydrocodone - using 1/2 tab q 6hrs. (+)Swelling of R knee.  Wears support hose which helps with swelling. Using cane to help with ambulation.  Also c/o L hip pain.  Has hx of lumbar stenosis & sciatica.  Has undergone steroid injections of hip which sound to be intraarticular.  Does not remember when her last injection was.  Today pain is mainly on lateral aspect of the hip.  Denies groin pain.  Denies any injury or trauma to the hip. Denies any numbness/tingling.   Allergies: No Known Drug Allergies  Review of Systems      See HPI  Physical Exam  General:  Well-developed,well-nourished, in no acute distress; alert,appropriate and cooperative throughout examination Msk:  KNEES:  (+) Genu valgus with med compartment collapse on RT > LT.  - Right Knee: Moderate amount of knee swelling, with possible small effusion.  Also has +1 edema of RLE. No bruising or bony abnormalities ROM 0-120 with 2+ crepitation & pain at terminal flexion. (+)TTP along medial & lateral joint lines.  Mild TTP along patella.  Mild pain with patellar compression Normal ACL, PCL, MCL and LCL with special testing (+) McMurray's  - Left Knee: No significant swelling or effusion of knee.  LLE with 1+ edema. No bony abnormalities or bruising Full ROM without pain, but does have 2+ crepitation.  Mild medial & lateral joint line tenderness.  Mild pain with patellar compression Normal MCL, LCL, ACL, and PCL Neg McMurray's  HIPS: full ROM b/l without pain.  (+)TTP over left  greater trochanter.  No inguinal pain, no pain along ITB.  Neg FABER, no SI-joint pain, no piriformis tenderness.  neg log roll b/l.  Neurovascularly intact distally.   Neurologic:  sensation intact to light touch.     Impression & Recommendations:  Problem # 1:  KNEE PAIN, RIGHT (ICD-719.46)  - Secondary to long-standing DJD - Elected to undergo steroid injection today: PROCEDURE NOTE Consent obtained and verified. Area cleansed with alcohol. Topical analgesic spray: Ethyl chloride. Joint: R knee Approached in typical fashion with: Superior-lateral approach Completed without difficulty Meds: Kenalog 40mg  1 cc + 1% Lidocaine 4cc (total vol = 5cc) Needle: 25g 1.5 in Tolerated injection well & felt improvement in office before leaving. Aftercare instructions and Red flags advised.  - Cont. using Hydrocodone as directed.  Should cont. Miralax to prevent constipation. - Cont. to wear compression stockings to help with swelling.  Try to keep feet elevated when at rest. - Cont. to use cane to assist with ambulation. - Cont. water exercises - f/u 2-3 months as needed, encouraged to call with any questions or concerns.    Her updated medication list for this problem includes:    Hydrocodone-acetaminophen 10-660 Mg Tabs (Hydrocodone-acetaminophen) .Marland Kitchen... 1/2 to 1 full tab by mouth q6-8 hours as needed for pain.  Orders: Joint Aspirate / Injection, Large (20610) Kenalog 10 mg inj ([id number])  Problem # 2:  DEGENERATIVE JOINT DISEASE, KNEES, BILATERAL (ICD-715.96)  -  see above  Problem # 3:  HIP PAIN, LEFT (ICD-719.45)  - From Greater Troch. bursitis - Pt underwent steroid injection in office today: PROCEDURE NOTE Consent obtained and verified. Area cleansed with alcohol. Topical analgesic spray: Ethyl chloride. Joint: Lt greater trochanteric bursa Approached in typical fashion with: posterior lateral approach Completed without difficulty Meds: Kenalog 40mg  1cc + 1% Lidocaine  4 cc (Total vol = 5cc) Needle: 25g 1.5 inch. Tolerated injection well & felt improvement in office before leaving. Aftercare instructions and Red flags advised. - f/u 2-3 months as needed   Her updated medication list for this problem includes:    Hydrocodone-acetaminophen 10-660 Mg Tabs (Hydrocodone-acetaminophen) .Marland Kitchen... 1/2 to 1 full tab by mouth q6-8 hours as needed for pain.  Orders: Joint Aspirate / Injection, Large (20610) Kenalog 10 mg inj (E4540)  Orders: Joint Aspirate / Injection, Large (20610) Kenalog 10 mg inj (J3301)  Problem # 4:  GREATER TROCHANTERIC BURSITIS (ICD-726.5) - Steroid injection given today.  See above  Complete Medication List: 1)  Synthroid 75 Mcg Tabs (Levothyroxine sodium) 2)  Wellbutrin Xl 300 Mg Xr24h-tab (Bupropion hcl) 3)  Lasix 20 Mg Tabs (Furosemide) 4)  Lanoxin 0.25 Mg Tabs (Digoxin) .Marland Kitchen.. 1 by mouth daily 5)  Alprazolam 0.25 Mg Tbdp (Alprazolam) 6)  Hydrocodone-acetaminophen 10-660 Mg Tabs (Hydrocodone-acetaminophen) .... 1/2 to 1 full tab by mouth q6-8 hours as needed for pain. 7)  Coreg 12.5 Mg Tabs (Carvedilol) 8)  Lyrica 50 Mg Caps (Pregabalin) 9)  Fosamax 70 Mg Tabs (Alendronate sodium)

## 2010-11-28 NOTE — Consult Note (Signed)
Summary: [facility]  [facility]   Imported By: Marily Memos 12/05/2009 10:48:49  _____________________________________________________________________  External Attachment:    Type:   Image     Comment:   External Document

## 2010-12-13 ENCOUNTER — Ambulatory Visit
Admission: RE | Admit: 2010-12-13 | Discharge: 2010-12-13 | Disposition: A | Discharge status: Home / Self Care | Payer: Medicare Other | Source: Ambulatory Visit | Attending: Oncology | Admitting: Oncology

## 2010-12-13 DIAGNOSIS — Z1231 Encounter for screening mammogram for malignant neoplasm of breast: Secondary | ICD-10-CM

## 2010-12-14 ENCOUNTER — Other Ambulatory Visit: Payer: Self-pay | Admitting: Gynecology

## 2010-12-19 ENCOUNTER — Encounter (HOSPITAL_BASED_OUTPATIENT_CLINIC_OR_DEPARTMENT_OTHER): Payer: Medicare Other | Admitting: Oncology

## 2010-12-19 DIAGNOSIS — C187 Malignant neoplasm of sigmoid colon: Secondary | ICD-10-CM

## 2010-12-19 DIAGNOSIS — C50919 Malignant neoplasm of unspecified site of unspecified female breast: Secondary | ICD-10-CM

## 2010-12-20 ENCOUNTER — Ambulatory Visit (INDEPENDENT_AMBULATORY_CARE_PROVIDER_SITE_OTHER): Payer: Medicare Other | Admitting: Cardiology

## 2010-12-20 DIAGNOSIS — I509 Heart failure, unspecified: Secondary | ICD-10-CM

## 2011-02-05 LAB — DIFFERENTIAL
Basophils Absolute: 0 10*3/uL (ref 0.0–0.1)
Basophils Relative: 0 % (ref 0–1)
Eosinophils Absolute: 0 K/uL (ref 0.0–0.7)
Eosinophils Relative: 0 % (ref 0–5)
Lymphocytes Relative: 11 % — ABNORMAL LOW (ref 12–46)
Lymphs Abs: 1.1 K/uL (ref 0.7–4.0)
Monocytes Absolute: 0.6 K/uL (ref 0.1–1.0)
Monocytes Relative: 6 % (ref 3–12)
Neutro Abs: 7.9 10*3/uL — ABNORMAL HIGH (ref 1.7–7.7)
Neutrophils Relative %: 83 % — ABNORMAL HIGH (ref 43–77)

## 2011-02-05 LAB — BASIC METABOLIC PANEL
BUN: 22 mg/dL (ref 6–23)
BUN: 28 mg/dL — ABNORMAL HIGH (ref 6–23)
CO2: 29 mEq/L (ref 19–32)
CO2: 29 mEq/L (ref 19–32)
Calcium: 10.1 mg/dL (ref 8.4–10.5)
Chloride: 100 mEq/L (ref 96–112)
Creatinine, Ser: 0.84 mg/dL (ref 0.4–1.2)
GFR calc non Af Amer: 49 mL/min — ABNORMAL LOW (ref >60)
Glucose, Bld: 111 mg/dL — ABNORMAL HIGH (ref 70–99)
Glucose, Bld: 144 mg/dL — ABNORMAL HIGH (ref 70–99)
Potassium: 3.6 mEq/L (ref 3.5–5.1)
Sodium: 136 mEq/L (ref 135–145)
Sodium: 140 mEq/L (ref 135–145)

## 2011-02-05 LAB — CK TOTAL AND CKMB (NOT AT ARMC)
CK, MB: 2.1 ng/mL (ref 0.3–4.0)
Total CK: 52 U/L (ref 7–177)

## 2011-02-05 LAB — APTT: aPTT: 26 seconds (ref 24–37)

## 2011-02-05 LAB — CBC
HCT: 42.8 % (ref 36.0–46.0)
Hemoglobin: 13.9 g/dL (ref 12.0–15.0)
Hemoglobin: 14.5 g/dL (ref 12.0–15.0)
MCHC: 32.9 g/dL (ref 30.0–36.0)
MCHC: 33.9 g/dL (ref 30.0–36.0)
MCV: 94.5 fL (ref 78.0–100.0)
Platelets: 226 10*3/uL (ref 150–400)
Platelets: 246 10*3/uL (ref 150–400)
RBC: 4.53 MIL/uL (ref 3.87–5.11)
RDW: 15.1 % (ref 11.5–15.5)
RDW: 15.1 % (ref 11.5–15.5)
WBC: 9.6 K/uL (ref 4.0–10.5)

## 2011-02-05 LAB — LIPID PANEL
LDL Cholesterol: 139 mg/dL — ABNORMAL HIGH (ref 0–99)
VLDL: 19 mg/dL (ref 0–40)

## 2011-02-05 LAB — BASIC METABOLIC PANEL WITH GFR
Calcium: 10 mg/dL (ref 8.4–10.5)
Creatinine, Ser: 1.07 mg/dL (ref 0.4–1.2)
GFR calc Af Amer: 59 mL/min — ABNORMAL LOW (ref >60)

## 2011-02-05 LAB — CARDIAC PANEL(CRET KIN+CKTOT+MB+TROPI)
CK, MB: 2 ng/mL (ref 0.3–4.0)
Relative Index: INVALID (ref 0.0–2.5)
Total CK: 22 U/L (ref 7–177)
Total CK: 39 U/L (ref 7–177)
Troponin I: 0.02 ng/mL (ref 0.00–0.06)
Troponin I: 0.03 ng/mL (ref 0.00–0.06)

## 2011-02-05 LAB — PROTIME-INR
INR: 1 (ref 0.00–1.49)
Prothrombin Time: 13 seconds (ref 11.6–15.2)

## 2011-02-05 LAB — TROPONIN I: Troponin I: 0.02 ng/mL (ref 0.00–0.06)

## 2011-02-05 LAB — DIGOXIN LEVEL: Digoxin Level: 0.6 ng/mL — ABNORMAL LOW (ref 0.8–2.0)

## 2011-02-27 DIAGNOSIS — D3910 Neoplasm of uncertain behavior of unspecified ovary: Secondary | ICD-10-CM

## 2011-02-27 HISTORY — DX: Neoplasm of uncertain behavior of unspecified ovary: D39.10

## 2011-03-13 NOTE — Assessment & Plan Note (Signed)
OFFICE VISIT   Debra Brooks, Debra Brooks  DOB:  07/04/1925                                       05/16/2007  JYNWG#:[id number]   Debra Brooks presents today for evaluation of lower extremity venous  pathology. She is well known to me from her prior emergent repair of a  large hematoma following cardiac catheterization in October 2007. She  reports today with concern regarding increasing difficulty with her  veins. 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 She does not  have any history of deep venous thrombosis or bleeding. She does report  a generalized aching sensation and some swelling bilaterally. She has  been extremely compliant with her graduated compression stockings over  the course of the years wearing thigh high when she first gets up in the  morning. Her past history is unchanged. She does have an irregular  heartbeat.   PHYSICAL EXAMINATION:  VITAL SIGNS:  Blood pressure 120/70, pulse 76,  respirations 16.  EXTREMITIES:  She does have marked telangiectasia bilaterally. 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 She underwent a handheld venous duplex by me in the office and  this reveals competence in her right great saphenous vein. 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   We discussed options for Ms. Hamada. Actually, this certainly is not  life threatening or limb threatening and its really more of a nuisance.  I explained that she could have treatment for her telangiectasia, but  would recommend this only for cosmetic concerns and she is comfortable  with the spider veins that she has. I have recommended against any  treatment of her reflux and her left great saphenous vein since this  does not appear to be causing any symptoms currently. She is  comfortable with this discussion and will continue with her compression  garments. She will see [place] on a p.r.n. basis.   Larina Earthly, M.D.  Electronically Signed   TFE/MEDQ  D:  05/16/2007  T:  05/17/2007  Job:  [id number]   cc:   Gretta Cool, M.D.  Colleen Can. Deborah Chalk, M.D.  Bryan Lemma. Manus Gunning, M.D.

## 2011-03-13 NOTE — H&P (Signed)
Debra Brooks, Debra [name]:  [id number]   MEDICAL RECORD NO.:  [id number]          PATIENT TYPE:  EMS   LOCATION:  [facility]                         FACILITY:  [facility]   PHYSICIAN:  Unice Cobble, MD     DATE OF BIRTH:  05/25/1925   DATE OF ADMISSION:  04/17/2009  DATE OF DISCHARGE:                              HISTORY & PHYSICAL   Cardiologist, Dr. Deborah Chalk from [facility]   CHIEF COMPLAINT:  Chest pain.   HISTORY OF PRESENT ILLNESS:  This is an 77 year old white female with a  history of atrial flutter status post ablation, hypertension,  dyslipidemia who presents with chest pain.  The patient had onset of  sudden chest tightness while walking to her car after lunch at 1:15 p.m.  The pain was associated with shortness of breath but no diaphoresis or  presyncope.  No palpitations, although she did not experience  palpitations with her atrial flutter.  She rested for a little bit and  it got a little better but continued to persist, so she came to the  emergency department.  In the emergency department she was given  sublingual nitroglycerin with a little bit of more relief.  She does not  complain of orthopnea or PND.  She has stable lower extremity edema.  She has never felt chest tightness like this before.   PAST MEDICAL HISTORY:  1. Atrial flutter status post ablation.  She is amiodarone intolerant.      Last echo showed an ejection fraction of 55-60% with moderate TR.  2. She has had extracranial cerebrovascular arterial occlusive      disease.  3. Hypertension.  4. Chronic lower extremity edema.  5. Depression.  6. Dyslipidemia.  7. Hypothyroidism.  8. Status post appendectomy.  9. Status post tonsillectomy.  10.Status post bladder tack.  11.Status post partial colectomy.   ALLERGIES:  VALIUM and CONTRAST MEDIA which causes hives.   MEDICATIONS:  1. Bupropion 300 mg daily.  2. Lyrica 50 mg b.i.d.  3. Lanoxin 250 mcg daily.  4. Synthroid  75 mcg daily.  5. Protonix 40 mg daily.  6. Carvedilol 12.5 mg b.i.d.  7. Lasix 40 mg daily.  8. Aspirin 81 mg daily.   SOCIAL HISTORY:  She lives in [place] with her husband.  No smoking,  alcohol or drugs.   FAMILY HISTORY:  Her father had an MI and died of it at the age of 58.   REVIEW OF SYSTEMS:  Complete review of systems was done and found to be  otherwise negative except as stated in the HPI.   PHYSICAL EXAM:  VITAL SIGNS:  Temperature is 96.4 with a pulse of 67,  respiratory rate 12, blood pressure 153/63, O2 sats 96 on 2 liters.  GENERAL: She is thin, in no acute distress.  HEENT: PERRLA, EOMI, MMM, oropharynx without erythema or exudates.  NECK: Supple without lymphadenopathy, thyromegaly, bruits or jugular  venous distention.  HEART:  Regular rate and rhythm with a normal S1 and S2.  No murmurs,  gallops or rubs.  Pulses 2+ and equal bilaterally without  bruits.  She  has frequent PVCs.  LUNGS:  Clear to auscultation bilaterally.  ABDOMEN: Soft, nontender with normal bowel sounds.  No rebound or  guarding.  EXTREMITIES:  No cyanosis or clubbing.  She has 2+ nonpitting edema  bilaterally.  MUSCULOSKELETAL:  No joint deformity or effusions or spine  or CVA tenderness.  NEUROLOGICAL:  She is alert and oriented x3 with cranial nerves II-XII  grossly intact.  Strength 5/5 in all extremities and axial groups with  normal sensation throughout.   LABORATORY REVIEW:  Chest x-ray shows no acute cardiopulmonary disease.  EKG shows a rate of 64 and normal sinus rhythm with PVCs.  She has a Q-  wave in AVL and V2.  She has right bundle branch block and left  ventricular hypertrophy.  Labs show a white count of 9.6 with a  hemoglobin of 14.5.  Creatinine is 1.1.  Troponin and CK-MB are  undetectable at this time.  Digoxin level 0.6.   ASSESSMENT/PLAN:  This is an 77 year old white female with a history of  hypertension, hyperlipidemia, atrial flutter status post ablation  who  presents with chest pain.  Her chest pain is concerning for new onset  angina.  Will admit for rule out overnight.  Her beta blocker and  aspirin will be continued.  Statin will be added and a lipid panel will  be checked in the morning.  She will be heparinized if her troponin  becomes positive.  She will receive a stress versus a catheterization in  the morning.  If she does go to catheterization she will need IV dye  prophylaxis.      Unice Cobble, MD  Electronically Signed     ACJ/MEDQ  D:  04/17/2009  T:  04/17/2009  Job:  [id number]

## 2011-03-13 NOTE — Cardiovascular Report (Signed)
Debra Brooks, Debra Brooks                ACCOUNT NO.:  [id number]   MEDICAL RECORD NO.:  [id number]           PATIENT TYPE:   LOCATION:                                 FACILITY:   PHYSICIAN:  Colleen Can. Deborah Chalk, M.D.DATE OF BIRTH:  February 13, 1925   DATE OF PROCEDURE:  04/19/2009  DATE OF DISCHARGE:                            CARDIAC CATHETERIZATION   PROCEDURE:  Left heart catheterization with selective coronary  angiography, left ventricular angiography.   TYPE AND SITE OF ENTRY:  Percutaneous right femoral artery with Angio-  Seal.   CATHETERS:  1. A 6-French 4 curved Judkins right and left coronary catheter.  2. A 6-French pigtail ventriculographic catheter.   CONTRAST MATERIAL:  Omnipaque.   MEDICATIONS GIVEN PRIOR TO PROCEDURE:  Benadryl, Pepcid, prednisone.   MEDICATIONS GIVEN DURING THE PROCEDURE:  Versed.   COMMENTS:  The patient tolerated the procedure well.   HEMODYNAMIC DATA:  The aortic pressure was 189/74, LV was 190/1-16.  There is no aortic valve gradient noted on pullback.   ANGIOGRAPHIC DATA:  1. The left main coronary artery was normal.  2. Left circumflex:  The left circumflex had mild irregularities but      was essentially normal otherwise.  It was a small vessel.  3. Left anterior descending:  Left anterior descending had 2 diagonals      with the second diagonal essentially being a bifurcation of the      left anterior descending.  There were mild irregularities on that      left anterior descending with perhaps 30-50% narrowing in the      midportion of the left anterior descending but no significant      obstructive disease.  4. Right coronary artery.  The right coronary artery was a large-      dominant vessel.  It had a 50-60% ostial stenosis.  There is      calcification in the proximal portion of the vessel.  There were 3      large posterior descending and posterior lateral branches without      significant obstructive disease.   The left  ventricular angiogram was performed in the RAO position.  Overall, cardiac size and silhouette were normal.  The global ejection  fraction estimated to be 50-55%.   Abdominal aortogram showed excellent flow with only mild narrowing in  the ostium of the right renal artery.  This will be less than 20-30%  narrowing.  There is no significant obstructive disease noted otherwise  and only mild irregularities in the distal aorta.   OVERALL IMPRESSION:  1. Normal left ventricular function.  2. Moderate 60% stenosis in the ostium of the large dominant right      coronary artery with mild irregularities otherwise.  3. Mild right renal artery stenosis with no significant focal      obstructive disease.   DISCUSSION:  It is felt that Ms. Pacey can be managed medically.  Angio-  Seal was performed.  Labetalol was given for management of hypertension  with satisfactory control in the postcatheterization phase.      Colleen Can.  Deborah Chalk, M.D.  Electronically Signed     SNT/MEDQ  D:  04/19/2009  T:  04/20/2009  Job:  [id number]

## 2011-03-13 NOTE — Discharge Summary (Signed)
[name], [name] NO.:  [id number]   MEDICAL RECORD NO.:  [id number]          PATIENT TYPE:  INP   LOCATION:  2034                         FACILITY:  [facility]   PHYSICIAN:  Colleen Can. Deborah Chalk, M.D.DATE OF BIRTH:  04/03/1925   DATE OF ADMISSION:  04/17/2009  DATE OF DISCHARGE:  04/20/2009                               DISCHARGE SUMMARY   DISCHARGE DIAGNOSES:  1. Chest pain, negative cardiac enzymes, with subsequent cardiac      catheterization showing a 60% ostial right coronary lesion, she      will she will be managed medically.  2. Hypertension, Cozaar has been added.  3. History of atrial flutter ablation, she is amiodarone-intolerant.  4. Moderate tricuspid regurgitation with an ejection fraction of 55-      60%.  5. Extracranial cerebrovascular arterial occlusive disease.  6. Chronic lower extremity edema.  7. Anxiety and depression.  8. Dyslipidemia, she has been started back on statin therapy.  9. Hypothyroidism, on replacement.   HISTORY OF PRESENT ILLNESS:  Debra Brooks is a very pleasant 77 year old white  female who has several medical problems.  She presents to the hospital  with complaints of chest discomfort.  She notes that her blood pressure  was elevated as well.  She did not have any palpitations.  She was able  to rest for a little bit and her symptoms improved but then seemed to  return.  She presented to the emergency room department, where she was  given nitroglycerin with some relief.  She was subsequently admitted for  further evaluation.   Please see the dictated history and physical for further patient  presentation and profile.   LABORATORY DATA:  On admission, cardiac enzymes were negative.  CBC  showed a hemoglobin of 14, white count was 9.6.  Chemistry showed a BUN  of 28, creatinine 1.1, glucose was 111.  Digoxin level was 0.6.  Chest x-  ray showed no acute pulmonary disease.  Her EKG showed sinus rhythm with  PVCs with a right  bundle branch block and LVH.   HOSPITAL COURSE:  The patient was admitted electively.  She ruled out  negative for myocardial infarction.  She did have a brief episode of  discomfort the first night of her admission.  She has had a negative  Cardiolite study 6 months, but in light of recurrent symptoms she was  felt to need cardiac catheterization.  That procedure was performed on  the afternoon of June 22 and was tolerated without any known  complications.  LV function was normal.  The right coronary artery is  dominant and has a 60% ostial narrowing, the left main was normal, the  LAD had irregularities, and the left circumflex was normal.  Her blood  pressure was elevated during the case and she required IV labetalol.  She was subsequently transferred back to the floor and today on April 20, 2009, she is doing well without complaints.  Overall physical exam is  unremarkable, her groin is unremarkable, and she is felt to be a  satisfactory candidate for discharge today.  DISCHARGE CONDITION:  Stable.   DISCHARGE DIET:  Heart-healthy.   WOUND CARE:  She is to use an ice pack if needed to the groin.  She is  to keep the groin clean and may wash with soap and water here and use a  Band-Aid over the puncture site for the next 1-2 days.  She is not to do  any heavy lifting for 1 week.  Her activity is to be increased as  tolerated.   DISCHARGE MEDICINES:  1. Bupropion 300 mg a day.  2. Lyrica 50 mg b.i.d.  3. Lanoxin 0.25 daily.  4. Synthroid 75 mcg a day.  5. Protonix 40 mg a day.  6. Carvedilol 12.5 b.i.d.  7. Lasix 40 mg a day.  8. Baby aspirin daily.  9. Hydrocodone 1 tablet q.8 h. p.r.n. pain.  10.MiraLax 17 grams p.o. p.r.n. constipation.  11.Xanax 0.25 t.i.d. p.r.n. anxiety.  12.We will be adding Zocor 40 mg a day as well as Cozaar 50 mg a day.   We will plan on seeing her back in the office in approximately 2 weeks,  certainly sooner if problems arise in the interim.  She will be spending  the next few days in rehab at [facility] if a bed is available.   Greater than 30 minutes spent for discharge.      Sharlee Blew, N.P.      Colleen Can. Deborah Chalk, M.D.  Electronically Signed    LC/MEDQ  D:  04/20/2009  T:  04/20/2009  Job:  [id number]

## 2011-03-13 NOTE — Procedures (Signed)
DUPLEX DEEP VENOUS EXAM - LOWER EXTREMITY   INDICATION:  Bilateral lower extremity edema.   HISTORY:  Edema:  Chronic bilateral lower extremity edema.  Trauma/Surgery:  Patient had a surgical repair of a right femoral  artery.  Pain:  Bilaterally.  PE:  No.  Previous DVT:  No.  Anticoagulants:  Patient was on Coumadin for atrial fibrillation prior  to ablation and cardioversion.  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  Patient currently uses compression  stockings in her lower extremities bilaterally.   DUPLEX EXAM:                CFV   SFV   PopV  PTV    GSV                R  L  R  L  R  L  R   L  R  L  Thrombosis    O  o  o  o  o  o  o   o  o  o  Spontaneous   +  +  +  +  +  +  +   +  +  +  Phasic        +  +  +  +  +  +  +   +  +  +  Augmentation  +  +  +  +  +  +  +   +  +  +  Compressible  +  +  +  +  +  +  +   +  +  +  Competent     0  0  0  +  +  +  +   +  0  0   Legend:  + - yes  o - no  p - partial  D - decreased   IMPRESSION:  1. No evidence of deep or superficial vein thrombosis in the lower      extremities bilaterally.  2. No evidence of baker's cysts bilaterally.  3. Greater saphenous veins are incompetent from the saphenofemoral      junctions distally bilaterally.  4. Right common femoral vein and right superficial femoral vein are      incompetent.  5. Left common femoral vein is incompetent.    _____________________________  Larina Earthly, M.D.   MC/MEDQ  D:  12/16/2007  T:  12/17/2007  Job:  [id number]

## 2011-03-14 ENCOUNTER — Telehealth: Payer: Self-pay | Admitting: Cardiology

## 2011-03-14 NOTE — Telephone Encounter (Signed)
Pt called She said that she thinks she has appointment with Dr Deborah Chalk. Please call her back

## 2011-03-14 NOTE — Telephone Encounter (Signed)
Pt called requesting an appointment with Dr. Deborah Chalk.  RN scheduled pt for 03/30/11 at 2 pm.  Pt notified of appointment.

## 2011-03-16 NOTE — Discharge Summary (Signed)
Debra Brooks, [name]:  [id number]   MEDICAL RECORD NO.:  [id number]          PATIENT TYPE:  INP   LOCATION:  2040                         FACILITY:  [facility]   PHYSICIAN:  Maple Mirza, PA   DATE OF BIRTH:  1924/11/04   DATE OF ADMISSION:  08/08/2006  DATE OF DISCHARGE:  08/15/2006                                 DISCHARGE SUMMARY   She has allergies to VALIUM and CONTRAST MEDIA.   PRINCIPAL DIAGNOSES:  1. Admitted with symptomatic atrial flutter.      a.     Fatigue, dyspnea on exertion, palpitation.      b.     Amiodarone intolerance secondary to washed-out feeling and flu-       like symptoms.      c.     Direct current cardioversion x2 (2005, September 2007).  2. Discharging day #7, status post electrophysiology/right flank catheter      ablation of atrial flutter.  3. Discharging day #6, status post repair of right common femoral artery      tear/evacuation of hematoma.  4. Acute blood loss anemia.      a.     Two units of packed red blood cells administered October 13 for       a hemoglobin of 7.9.      b.     Rebound to hemoglobin 10.4 by October 17.      c.     Iron replenishment here at this hospital.  5. Abdominal ultrasound.  The aorta measures 2.5 cm in the AP and      transverse diameters.  There is no aneurysm seen.  There is diffuse      atheromatous plaque throughout the lumen of the abdominal aorta.   SECONDARY DIAGNOSES:  1. Hypertension.  2. Echocardiogram May 2007, ejection fraction 55-60%, moderate tricuspid      regurgitation, mild pulmonary hypertension.  3. History of breast cancer, on Arimidex.  4. Chronic lower extremity edema exacerbated this admission from      inactivity and lack of compression hose.  5. Dyslipidemia.  6. Treated hypothyroidism.  7. Status post appendectomy, tonsillectomy, bladder tacking, partial      colectomy.   PROCEDURES:  1. July 29, 2006, electrophysiology study and radiofrequency ablation  of      a cavotricuspid isthmus-dependent atrial flutter and radiofrequency      catheter ablation of atrioventricular nodal reentry tachycardia with      slow P wave modification.  Note that the patient had an antecedent      history of paroxysmal atrial tachycardia some years ago and likely      atrioventricular nodal dependent.  2. August 09, 2006, exploration of right groin with evacuation of      hematoma and repair of right common femoral artery tear by Debra Brooks, M.D.  The patient had a blood loss from a hemoglobin of 12.9 to      7.9.  The right thigh is ecchymotic, indurated, swollen and tender.      The tenderness persists  after evacuation of hematoma as well as the      ecchymosis.  The patient's drain was removed on postprocedure drain #3      of the repair of the common femoral artery, and drainage from the drain      site has subsided greatly in the last 48 hours.   BRIEF HISTORY:  Debra Brooks is a 77 year old female with a 5-6 year history  of symptomatic atrial tachyarrhythmias.  She gets supreme fatigue, dyspnea  on exertion, palpitation.  She has been taken off amiodarone in the past  secondary to a general feeling of being washed out with malaise and flu-like  symptoms.   She has had cardioversion x2.  The first was in 2005, the second in  September 2007.  The latter cardioversion lasted about 10 days before return  of dysrhythmia and symptoms.  She now presents for electrophysiology study  and radiofrequency catheter ablation of an atrial flutter.   HOSPITAL COURSE:  The patient presented electively for electrophysiology  study and radiofrequency catheter ablation.  The study showed cavotricuspid  isthmus-dependent atrial flutter, which was successfully terminated with  creation of a bidirectional block.  She also demonstrated AV nodal reentry  tachycardia, which was terminated with slow P wave modification.  No  accessory pathways were illustrated during  the electrophysiology study.  In  the 6-8 hour period after the procedure, there was noted to be some minor  induration and swelling in the right groin.  This had become quite  pronounced by postprocedure day #1, October 12.  Dr. Graciela Husbands immediately  called a consult with the cardiovascular and thoracic surgeons and she was  seen by Dr. Gretta Began.  He diagnosed a large, expanding hematoma on the  right groin most probably secondary to arterial injury.  The patient was  taken to the operating room the same day, October 12.  A large right groin  hematoma was evacuated and the right common femoral artery, which had a  tear, was repaired.  The patient did experience acute blood loss anemia with  a nadir hemoglobin of 7.9, down from 12.9 on admission.  She was transfused  on October with two units of packed red blood cells with rebound to 9.7.  She did have some minor decline in her hemoglobin the day after transfusion,  but this in the last 48 hours here at [facility] has rebounded  nicely.  The patient has been put on iron and her hemoglobin went from 9.0  to 9.4 on October 16 to 10.4 on October 17.  Another complete blood count  will be taken prior to discharge on October 18.  The patient had some  constipation, which was remediated with suppository and stool softeners.  During physical examination, Dr. Graciela Husbands felt palpitations in the abdominal  area.  The patient did go for an abdominal ultrasound.  The study showed  that the abdominal aorta was 2.4 cm in both AP and transverse dimension.  There was no aneurysm found, but there was diffuse atheromatous plaque in  the lumen.  The patient throughout the hospitalization after radiofrequency  catheter ablation demonstrated sinus rhythm without significant ectopy.  The  patient had been maintained on Coumadin dosing beginning with October 13,  and INR became therapeutic on the day of discharge, October 18, allowing the patient to discharge  home to [place].  It is felt that the blood  transfusions have in some way affected the rapidity of the patient's INR to  reestablish itself in the postoperative period here.   Of note, the patient's lower extremities even prior to admission do  experience dependent chronic edema.  This seems to have been somewhat  exacerbated from inactivity and also due to the fact that the patient has  not been wearing her customary compression hose.  She will be given extra IV  Lasix the evening prior to her discharge with potassium supplementation.  In  addition, the patient's entry potassium was 3.4.  She is on Demadex 20 mg  daily.  Her potassium was replenished throughout this hospitalization, and  it was thought that a supplementation of 20 mEq daily against her Demadex  diuresis would be acceptable at discharge.   DISCHARGE MEDICATIONS:  1. Levothyroxine 75 mcg daily.  2. Coumadin 5 mg alternating with 2.5 mg daily.  3. Ambien as needed.  4. Fosamax dosed weekly.  5. Xanax 0.5 mg as needed.  6. Wellbutrin 300 mg daily.  7. Arimidex 1 mg daily.  8. Lipitor 10 mg daily at bedtime.  9. Demadex 20 mg daily.  10.Potassium chloride 20 mEq daily.  This is a new medication.  11.Lanoxin 0.5 mg tablets one-half tablet daily.  12.Coreg 12.5 mg b.i.d.  13.Multivitamin daily.  14.New medication, ferrous sulfate 325 mg daily.  15.Senokot S two tablets daily at bedtime.  16.The patient will also be given a medication for pain control at the      time of discharge.   LABORATORY STUDIES PRIOR TO DISCHARGE:  The complete blood count on October  17:  White cells 12.1, hemoglobin 10.4, hematocrit 30.5, platelets 294.  The  serum electrolytes on October 17:  Sodium 141, potassium 3.9, chloride 106,  carbonate 28, BUN is 13, creatinine is 0.9, glucose is 103.  The PT on  October 17 is 21.6, INR 1.8.   A sheet of the Coumadin dosing will be faxed to [facility].  The  patient will follow up  with [facility] for Coumadin clinic.  She  will also see Dr. Graciela Husbands in 4-6 weeks in follow-up.  The patient is able to  shower and will put a 4 x 4 gauze dressing at the drain site just to protect  the patient's clothing at discharge.  We will get a urinalysis as well for  her elevated white cell count of 12.1.           ______________________________  Maple Mirza, PA     GM/MEDQ  D:  08/14/2006  T:  08/15/2006  Job:  [id number]   cc:   Colleen Can. Deborah Chalk, M.D.  Bryan Lemma. Manus Gunning, M.D.  [facility]

## 2011-03-16 NOTE — Op Note (Signed)
Debra Brooks, Debra Brooks                ACCOUNT NO.:  [id number]   MEDICAL RECORD NO.:  [id number]          PATIENT TYPE:  AMB   LOCATION:  DSC                          FACILITY:  [facility]   PHYSICIAN:  Thornton Park. Daphine Deutscher, MD  DATE OF BIRTH:  [birth year]   DATE OF PROCEDURE:  08/21/2004  DATE OF DISCHARGE:                                 OPERATIVE REPORT   PREOPERATIVE DIAGNOSIS:  Left axillary mass.   POSTOPERATIVE DIAGNOSIS:  Left axillary lipoma.   OPERATION PERFORMED:  Excision of mass.   SURGEON:  Thornton Park. Daphine Deutscher, M.D.   ANESTHESIA:  MAC.   INDICATIONS FOR PROCEDURE:  [name] is a 77 year old lady with history  of breast cancer who had this palpable sore area in the left axillary  posterior axillary fold.  She has had it for some time.  We discussed  management options including observation.  Because it continued to be a  worry, she wanted to go ahead and have this area excised.   DESCRIPTION OF PROCEDURE:  She was taken back to room 8 of [facility] on August 21, 2004 and placed with her arm above her head  where she and I both could lateralize the lumpy area along the posterior  axillary line, really on the latissimus dorsi muscle.  I marked an incision  along the skin wrinkle there and then prepped with Betadine and injected  with 1% lidocaine.  I made an incision and carried it down and excised the  fat overlying this and found a little encapsulated angiolipomatous mass.  I  removed that along with the surrounding fat and continued to palpate the  area and found nothing that resembled any kind of cluster of lymph nodes or  any kind of evidence of metastatic disease.  I took this down to the  latissimus dorsi and just examined the anterior portion completely but saw  no evidence of any other masses.  I then used electrocautery to tidy up and  there was no bleeding noted.  The wound was closed with 4-0 Vicryl  subcutaneously with benzoin and  Steri-Strips.  The patient seemed to  tolerate the procedure well.  She will be given Vicodin for pain and be  followed up in the office in three to four weeks.      Matt  MBM/MEDQ  D:  08/21/2004  T:  08/21/2004  Job:  [id number]   cc:   Genene Churn. Cyndie Chime, M.D.  [address] [facility]  [place]  [place] [address]  Fax: [phone]

## 2011-03-16 NOTE — H&P (Signed)
[name], [name]:  [id number]   MEDICAL RECORD NO.:  [id number]          PATIENT TYPE:  INP   LOCATION:  2807                         FACILITY:  [facility]   PHYSICIAN:  Duke Salvia, MD, FACCDATE OF BIRTH:  [birth year]   DATE OF ADMISSION:  08/08/2006  DATE OF DISCHARGE:                                HISTORY & PHYSICAL   ALLERGIES:  VALIUM, WHICH CAUSES CRYING, AND TO CONTRAST MEDIA.   CARDIOLOGIST:  Colleen Can. Deborah Chalk, M.D.   PRIMARY PHYSICIANS:  1. Bryan Lemma. Manus Gunning, M.D.  2. Duke Salvia, electrophysiology.   PRESENTING CIRCUMSTANCE:  I am here to get fixed.   HISTORY OF PRESENT ILLNESS:  Debra Brooks is a 77 year old female with a  history of five to six years of symptomatic atrial tachyarrhythmia.  She  gets extreme fatigue, dyspnea on exertion, palpitations and general malaise.  She has been taken off amiodarone in the past secondary to a general  washout, myelitis, flu-like symptoms.   She has had DC cardioversion x2, the first was in 2005, the second in  September 2007.  The latter cardioversion lasted about ten days before  return of dysrhythmia and her symptoms.  She presents for electrophysiology  study, radiofrequency catheter ablation of atrial flutter.  INR this morning  is 3.1.  Patient has never had other antiarrhythmics except for the  amiodarone trial.   PAST MEDICAL HISTORY:  1. Atrial flutter, amiodarone intolerance.  2. Echocardiogram August of 2007, ejection fraction 55% to 60%, moderate      tricuspid regurgitation, mild pulmonary hypertension.  3. History of breast cancer on Arimidex.  4. Extracranial cerebrovascular arterial occlusive disease.  5. Hypertension.  6. Chronic lower extremity edema.  7. Depression.  8. Dyslipidemia.  9. Treated hypothyroidism.   PAST SURGICAL HISTORY:  1. Status post appendectomy.  2. Tonsillectomy.  3. Bladder tach.  4. Partial colectomy.   SOCIAL HISTORY:  The patient is married.   Does not smoke.  Does not partake  of alcoholic beverages.   FAMILY HISTORY:  Not investigated.   LABORATORY STUDIES:  PT is 34.1, INR 3.1.  Complete blood count:  White  cells 7.5, hemoglobin 12.9, hematocrit 38.3 and platelets 248.  Serum  electrolytes:  Sodium 140, potassium 3.4, chloride 104, carbonate 29, BUN is  18, creatinine 1, glucose 101.   MEDICATIONS:  1. Coumadin 5 mg alternate with 2.5 mg on a daily basis.  2. Synthroid 100 mcg daily.  3. Ambien as needed.  4. Fosamax weekly dose 70 mg.  5. Xanax 0.5 mg as needed.  6. Wellbutrin XL 300 mg daily.  7. Arimidex 1 mg daily.  8. Lipitor 10 mg daily at bedtime.  9. Demodex 20 mg daily.  10.Lanoxin 0.25 mg tablets 1/2 tablet daily.  11.Coreg 6.25 mg twice daily.   IMPRESSION:  Recurrent symptomatic atrial flutter.   PLAN:  Electrophysiology study, radiofrequency catheter ablation today,  October 11.   PHYSICAL EXAMINATION:  VITAL SIGNS:  Are not with me at the present time.  GENERAL:  Patient is alert and oriented x3 in no  acute distress.  HEENT:  Eyes:  Pupils equal, round and reactive to light.  Extraocular  movements are intact.  Sclerae are anicteric.  Nares without discharge, and  patent.  NECK:  Supple.  No carotid bruits auscultated.  No jugular venous  distension.  No cervical lymphadenopathy.  LUNGS:  Clear to auscultation and percussion bilaterally.  HEART:  Irregular rate and rhythm without murmur.  ABDOMEN:  Soft, nondistended.  Bowel sounds are present.  Nontender.  No  rebound or guarding.  No hepatosplenomegaly.  The abdominal aorta is  nonpulsatile and nonexpansile.  LOWER EXTREMITIES:  Show evidence of 3/4 edema bilaterally.  There are  spider telangiectasias.  Dorsalis pedis pulses are 4/4 bilaterally, radial  pulses 4/4 bilaterally.  NEUROLOGIC EXAM:  No deficits noted.   Once again, the plan is for electrophysiology study, radiofrequency catheter  ablation of atrial flutter today by Dr.  Hurman Horn.  Patient had been  seen in the office on October 10 by Dr. Graciela Husbands and the plan of action  described by him at that time.     ______________________________  Maple Mirza, PA    ______________________________  Duke Salvia, MD, [facility]   GM/MEDQ  D:  08/08/2006  T:  08/08/2006  Job:  [id number]

## 2011-03-16 NOTE — H&P (Signed)
[name], [name]                ACCOUNT NO.:  [id number]   MEDICAL RECORD NO.:  [id number]          PATIENT TYPE:  INP   LOCATION:  4739                         FACILITY:  [facility]   PHYSICIAN:  Colleen Can. Deborah Chalk, M.D.DATE OF BIRTH:  September 19, 1925   DATE OF ADMISSION:  08/30/2004  DATE OF DISCHARGE:                                HISTORY & PHYSICAL   CHIEF COMPLAINT:  Tachycardia.   HISTORY OF PRESENT ILLNESS:  Debra Brooks is a very pleasant 77 year old white  female who has a known history of cardiomyopathy with a past history of  atrial fibrillation.  She has basically done well for quite sometime.  She  has remained in sinus rhythm.  Her and her husband have recently moved to  [facility].  She has been quite involved with that  transition.  She has basically become exhausted from moving.  This morning  at approximately 6 a.m., she awoke with tachycardia.  She checked her blood  pressure with an automatic cuff and had a heart rate of approximately 130.  The nurse at [facility] checked her later on that morning and noted a heart  rate of 120 that was regular.  Her medicines were given to her.  She called  the office a couple of hours later and was instructed to take an extra dose  of Coreg.  She called back with an update around 3 o'clock and was noted to  still have tachycardia.  She was brought into the office where she was noted  to have atrial flutter (2 to 1).  She does have an awareness of palpitations  but no other associated symptoms.  She denies light headedness, dizziness or  syncope.  She has had no chest pain, no shortness of breath.  She is quite  fatigued, however.  She was subsequently seen in the office and admitted for  further evaluation.   PAST MEDICAL HISTORY:  1.  Paroxysmal atrial fibrillation.  2.  History of cardiomyopathy, her last 2D echocardiogram was in December      2001 which  was essentially normal.  3.  History of breast cancer.  4.  Carotid artery disease.  5.  Hypertension.  6.  Chronic lower extremity edema.  7.  History of depression.  8.  Remote history of holiday heart syndrome approximately 30-40 years ago      that was associated with alcohol use.   PAST SURGICAL HISTORY:  Appendectomy at age 13, tonsillectomy, bladder  tacking in 1950, and partial colectomy in 1992.   ALLERGIES:  Valium and x-ray dye.   CURRENT MEDICATIONS:  Multi-vitamin daily, vitamin C, B6, B12, and A daily,  Gingko daily, Ambien 10 mg daily, Fosamax weekly, Xanax p.r.n., Wellbutrin  XL 300 mg daily, Arimidex 1 mg daily, Lipitor 10 daily, Demadex 20 daily,  Lanoxin 0.25 daily, and Coreg 6.25 1/2 tablet b.i.d.   SOCIAL HISTORY:  She is married.  She has no current tobacco or alcohol use.   REVIEW OF SYMPTOMS:  As noted above, otherwise, unremarkable.   PHYSICAL EXAMINATION:  GENERAL:  Very pleasant  elderly white female  currently in no acute distress, she is quite anxious.  VITAL SIGNS:  Weight 149 pounds, blood pressure 150/90 sitting, 110/80  standing, heart rate 124, respirations 20, she is afebrile.  SKIN: Warm and dry, color is unremarkable.  LUNGS:  Basically clear.  HEART:  Tachycardic rhythm.  ABDOMEN:  Soft, positive bowel sounds, nontender.  EXTREMITIES:  Somewhat full with no significant edema.   LABORATORY DATA:  Pertinent labs are pending.  12 lead electrocardiogram  performed in the office shows atrial flutter 2 to 1 at a rate of 125.   IMPRESSION:  1.  Recurrent atrial flutter with rapid ventricular response.  2.  History of cardiomyopathy.  3.  Past history of atrial fibrillation.  4.  History of breast cancer.  5.  Hypertension.  6.  Coronary artery disease.   PLAN:  She is admitted to the hospital, we will place her on IV heparin,  will also start Amiodarone 400 mg b.i.d.  Hopefully, she will convert  spontaneously back to sinus rhythm.  Her other home medicines will be  continued.  Labs will be  checked, as well.       LC/MEDQ  D:  08/30/2004  T:  08/30/2004  Job:  [id number]

## 2011-03-16 NOTE — Op Note (Signed)
[name], [name]                ACCOUNT NO.:  [id number]   MEDICAL RECORD NO.:  [id number]          PATIENT TYPE:  INP   LOCATION:  2917                         FACILITY:  [facility]   PHYSICIAN:  Larina Earthly, M.D.    DATE OF BIRTH:  1924-12-11   DATE OF PROCEDURE:  08/09/2006  DATE OF DISCHARGE:                                 OPERATIVE REPORT   PREOPERATIVE DIAGNOSIS:  Large expanding hematoma, right groin.   POSTOPERATIVE DIAGNOSIS:  Large expanding hematoma, right groin.   PROCEDURES:  Evacuation of large hematoma; repair of right femoral artery;  drain placement.   SURGEON:  Larina Earthly, M.D.   ASSISTANT:  Coral Ceo, P.A.-C.   ANESTHESIA:  General endotracheal.   COMPLICATIONS:  None.   DISPOSITION:  To recovery room, stable.   PROCEDURE IN DETAIL:  The patient was taken to the operating room and placed  in supine position.  The areas of the right groin and thigh were prepped and  draped in the usual sterile fashion.  An incision was made over the hematoma  just below the puncture site in the right femoral artery.  The hematoma was  entered, and there was a very large, extensive hematoma extending down into  the medial thigh, and even posteriorly.  This was all evacuated.  There was  some bright red blood from the proximal portion of the wound, and this was  explored and there was bleeding from the puncture site in the common femoral  artery.  This was a single-wall puncture site centrally in the common  femoral artery, with no evidence of other arterial injury.  This was  controlled with a single 6-0 figure-of-eight suture.  The wound was again  irrigated, hemostasis obtained with electrocautery, and the remaining  hematoma was evacuated.  A 19 round Blake drain was brought through a  separate stab incision in the thigh and was positioned through the wound up  to the level of the femoral artery.  A 2-0 Vicryl suture was used for  closure of the subcutaneous tissue  in several layers, and the skin was  closed with a 3-0 subcuticular Vicryl stitch.  A sterile dressing was  applied and the patient was taken to the recovery room in stable condition.      Larina Earthly, M.D.  Electronically Signed     TFE/MEDQ  D:  08/09/2006  T:  08/09/2006  Job:  [id number]   cc:   Duke Salvia, MD, [facility]

## 2011-03-16 NOTE — Discharge Summary (Signed)
Debra Brooks, Debra Brooks                ACCOUNT NO.:  [id number]   MEDICAL RECORD NO.:  [id number]          PATIENT TYPE:  INP   LOCATION:  4739                         FACILITY:  [facility]   PHYSICIAN:  Colleen Can. Deborah Chalk, M.D.DATE OF BIRTH:  [birth year]   DATE OF ADMISSION:  08/30/2004  DATE OF DISCHARGE:  09/03/2004                                 DISCHARGE SUMMARY   PRIMARY DISCHARGE DIAGNOSES:  Tachycardia/atrial flutter with 2:1 block with  successful cardioversion as well as initiation of amiodarone.   SECONDARY DISCHARGE DIAGNOSES:  1.  Previous history of cardiomyopathy.  2.  History of paroxysmal atrial fibrillation.  3.  Situational stress.  4.  History of breast cancer.  5.  Known carotid artery disease.  6.  Hypertension.  7.  History of depression.  8.  Remote history of holiday heart syndrome approximately 30 to 40 years      ago associated with heavy alcohol use.   HISTORY OF PRESENT ILLNESS:  [name] is a very  pleasant 77 year old  female who has known history of cardiomyopathy as well as a history of  paroxysmal atrial fibrillation.  She and her husband just moved to  [place].  She has been considerably busy with moving boxes and unpacking  and has had considerable stress associated with that move as well as leaving  her home.  She was noted to have tachycardia on the day of admission at  approximately 6 a.m.  Her heart rate was approximately 130.  She was given  an extra dose of Coreg with really no response.  She was seen in our office  and was noted to have 2:1 atrial flutter with a rapid ventricular response.  She was subsequently brought to the hospital for further medical management.   Please see the dictated History and Physical for patient presentation and  profile.   LABORATORY DATA:  On admission, CBC was normal.  Chemistries were normal  except for a glucose of 163.  Digoxin level was less than 0.2.  CK and  troponin were negative.  TSH was  normal.   Chest x-ray showed no active disease.   HOSPITAL COURSE:  The patient was admitted. She was started on amiodarone as  well as IV heparin.  Unfortunately, she did not covert and subsequently  proceeded on with TEE cardioversion.  That was performed by Dr. Kristeen Miss on September 01, 2004.  This was successful after being cardioverted  with 20 joules.  Postprocedure she was transferred back to telemetry.  She  has been started on Coumadin.  Her INR is now therapeutic at 2.0, and she is  felt to be a stable candidate for discharge today on September 03, 2004.   CONDITION ON DISCHARGE:  Stable.   DISCHARGE MEDICATIONS:  She will resume all of her previous home medications  which include:  1.  Multivitamins daily.  2.  Vitamin C, B6, B12, and A daily.  3.  Gingko daily.  4.  Ambien 10 mg a day p.r.n.  5.  Fosamax weekly.  6.  Xanax p.r.n.  7.  Wellbutrin  300 a day.  8.  Amodex 1 mg daily.  9.  Lipitor 10 daily.  10. Demadex 20 daily.  11. Lanoxin 0.25 daily.  12. Coreg 3.125 b.i.d.  13. We will be adding amiodarone 400 mg b.i.d. x 1 week and then will cut      that to 200 mg b.i.d.  14. Coumadin will be at 5 mg a day.   ACTIVITY:  Her activity is to be light.   FOLLOW UP:  1.  She will have followup pro time in our office on Tuesday, November 8.  2.  We will see her in the office this coming Friday, November 11, at 10      a.m., certainly sooner if problems arise in the interim.       LC/MEDQ  D:  09/03/2004  T:  09/03/2004  Job:  [id number]

## 2011-03-16 NOTE — Letter (Signed)
August 07, 2006     Debra Can. Debra Brooks, M.D.  [address]  [place], [place] [address]   RE:  Debra Brooks, Debra Brooks  MRN:  [id number]  /  DOB:  1925/05/10   Dear Weyman Croon:   It was a pleasure to see Debra Brooks today regarding her atrial flutter.  As  you know, she is a 77 year old recently remarried mother of five,  grandmother of not enough, and great grandmother of not yet, who has a  history of arrhythmias that dates back 30 years.  At that point, she had  recurrent episodes of what she was told was PAT, they lasted about a year or  two, and then seemed to resolve.  A couple of years ago, [place] office for routine evaluation and was  found to be in tachycardia, this was called atrial fibrillation.  She was  hospitalized up in [place] for ten days, told she had congestive heart  failure.  Apparently, she saw you afterwards and you were not so sure that  she had congestive heart failure.  In any case, she converted spontaneously.  At some point, she got put on Amiodarone for control of her arrhythmias.  This was stopped in the spring of 2007 because of a number of complaints  that improved, thereafter.  She was recently cardioverted.  She held sinus  rhythm for just a week or two, and then reverted to atrial flutter.  She is  clearly quite symptomatic, with this she describes having less energy, more  shortness of breath, occasional chest pain associated with the arrhythmia.   Her cardiac evaluation has included an echocardiogram and a nuclear stress  test, both of which have been unrevealing and particularly worthy of note is  that the ejection fraction is normal, not withstanding the rapid 2 to 1  flutter that has been repeatedly documented.   Thromboembolic risk factors are notable for age, hypertension, question  heart failure, and whatever contribution there is with her gender.   Her past medical history is notable for:  1. Colon cancer.  2. Breast cancer.  3. Depression and anxiety.  4. Fatigue.  5. Orthostatic intolerance which has been getting worse.  6. Significant peripheral edema for which she wears thigh high panty hose.  7. Scarlet fever.   Her past surgical history is notable for her breast surgery, colon cancer,  tonsillectomy, and appendectomy.   Social history is mostly as noted above.  She does not use cigarettes,  alcohol, or recreational drugs.  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  She is here with one of her four step children.   On examination, she is an elderly Caucasian female appearing her stated age  of 54.  Her blood pressure is 152/90, her pulse is 100 and irregular.  HEENT:  Exam demonstrated no xanthoma.  The neck veins were 7-8 cm.  The  carotids were brisk and full bilaterally without bruits.  BACK:  Without kyphosis or scoliosis.  LUNGS:  Clear.  HEART:  Irregular and rapid.  No murmurs were appreciated.  ABDOMEN:  Soft with active bowel sounds and broad abdominal pulsation.  EXTREMITIES:  Femora pulses were 2+, distal pulses were intact, there is 2+  peripheral edema.  NEUROLOGICAL: Grossly normal.  SKIN:  Warm and dry.   Electrocardiogram from your office dated October 4 demonstrated atrial  flutter with 2 to  1 AV conduction.  There is right bundle branch block and  left axis deviation.  Electrocardiogram dated today here demonstrated atrial  flutter with variable antegrade conduction, otherwise, it was quite similar.   IMPRESSION:  1. Atrial flutter - typical with 2 to 1 and 3 to 1 block (rapid      ventricular response).  2. Exercise intolerance, fatigue, and shortness of breath related to      number 1.  3. Failure of cardioversion and intolerance of Amiodarone.  4. Thromboembolic risk factors notable for:      a.     Hypertension.      b.     Age.      c.     Question heart failure.      d.     Question contribution of  gender.  5. Abdominal mass - pulsating.   Weyman Croon, Debra Brooks has typical atrial flutter electrocardiographically.  This  has been quite symptomatic and has persisted despite repeat cardioversion  and she has not tolerated Amiodarone.  There is a remote history of atrial  fibrillation which may have some bearing on long term decisions regarding  anti-coagulation, but as the family and I discussed treatment options, the  question was would she be a good candidate and potentially benefit from  radiofrequency catheter ablation of her atrial flutter circuit.  I think the  answer to that is yes from a symptomatic point of view, and maybe also with  a risk reduction related to stroke and/or its medications.  We discussed the  potential benefits as well as the potential risks of the procedure including  but not limited to death, perforation, and vascular injury.  She understands  these risks and she would like to proceed.   Given the serendipity, we had a cancellation in the schedule for tomorrow  morning and so, she would like to proceed forthwith. She is scheduled to  come in tomorrow.  We will try and arrange to have an abdominal ultrasound  done while she is in the hospital, otherwise, I will defer that to your care  after she leaves.  We did check with your office and her INRs have all been  therapeutic.   Weyman Croon, thanks very much for asking [place] to see her.    Sincerely,     ______________________________  Duke Salvia, MD, [facility]    SCK/MedQ  /  Job #:  [id number]  DD:  08/07/2006 / DT:  08/08/2006   CC:    Bryan Lemma. Manus Gunning, M.D.

## 2011-03-16 NOTE — Op Note (Signed)
Debra Brooks, TIEDEMAN NO.:  [id number]   MEDICAL RECORD NO.:  [id number]          PATIENT TYPE:  INP   LOCATION:  2917                         FACILITY:  [facility]   PHYSICIAN:  Duke Salvia, MD, FACCDATE OF BIRTH:  [birth year]   DATE OF PROCEDURE:  08/08/2006  DATE OF DISCHARGE:                                 OPERATIVE REPORT   ELECTROPHYSIOLOGIST:  Duke Salvia, MD, [facility].   PREOPERATIVE DIAGNOSES:  Atrial flutter and a previous history of paroxysmal  atrial tachycardia (PAT).   POSTOPERATIVE DIAGNOSES:  Cavotricuspid isthmus-dependent atrial flutter and  AV nodal reentry of a slow-fast and slow-intermediate variant.   PROCEDURES:  Invasive electrophysiological study, arrhythmia mapping times  two, isoproterenol infusion, and radiofrequency catheter ablation times two.   DESCRIPTION OF PROCEDURES:  Following the obtaining of informed consent, the  patient was brought to the electrophysiology laboratory and placed on the  fluoroscopic table in the supine position. After routine prep and drape,  cardiac catheterization was performed under local anesthesia and conscious  sedation.  Noninvasive blood pressure monitoring and transcutaneous oxygen  saturation monitoring were performed continuously throughout the procedure.  Following the procedure, the catheters were removed, hemostasis was  obtained, and the patient was transferred to the floor in stable condition.   CATHETERS:  A 5-French quadripolar catheter was inserted via the left  femoral vein to the AV junction.   A 6-French octapolar catheter was inserted via the right femoral vein to the  coronary sinus.   A 7-French duodecapolar catheter was inserted via the left femoral vein to  the tricuspid annulus and the right atrium.   An 8-French x 8-mm deflectable-tip ablation catheter was inserted via the  right femoral vein to mapping sites in the posterior septal space, and then  switched out for a  4-mm deflectable-tip catheter for mapping sites in the  area of the slow pathway.   Surface leads 1, aVF and v1 were monitored continuously throughout the  procedure.  Following insertion of the catheters, the stimulation protocol  included incremental atrial pacing, incremental ventricular pacing, single  atrial  extrastimuli to a paced cycle length of 600 msec.   RESULTS:  Surface electrocardiogram of basic intervals:  Initial: rhythm: atrial flutter; R interval 532 msec; AA interval 246  msec;  PR interval n/a; QRS duration about 140 msec; QT interval 350  msec; P wave  duration: n/a; AH interval n/a; AH interval 47 msec; bundle branch block  present, right; preexcitation absent.   Final: rhythm sinus:  RR interval 841 msec; PR interval 190 msec; QRS  duration about 140 msec; QT interval 394 msec; P wave duration 134 msec;  preexcitation absent; bundle branch block present;  AH interval 114 msec; HP  interval 39 msec.   AV nodal function:  AV Wenckebach 600 msec (+/-).  VA Wenckebach was 600  msec.   The AV interval was hyperrefractory. At a paced cycle length of 600 msec, it  was 410 msec. And  AV nodal conduction was discontinuous with echo beats.   Accessory pathway function:  No evidence of  an accessory pathway was  identified.   Arrhythmias, mapped and induced:  The patient presented to the lab in atrial flutter.  Electrogram mapping  using a catheter in the cavotricuspid isthmus demonstrated the  counterclockwise rotation around the tricuspid annulus.  Radiofrequency  energy terminated this tachycardia, confirming it s cavotricuspid isthmus  dependence.   A slow tachycardia with a cycle length of about 690 msec was reproducibly  induced with programmed atrial stimulation dependent on significant AH  prolongation.  This tachycardia would last minutes.  Atrial activation was  distinct from sinus.  VA linking did not occur.  We did not have it  sustained long enough to do  ventricular pacing during tachycardia.  As the  patient had an antecedent history of PAT, it was my impression that this  represented a slow-fast and a slow-intermediate variant of AV nodal reentry  tachycardia.   Radiofrequency energy was applied for a total of 5 minutes 46 seconds at 2  targets.  Initially, it was targeted at the atrial flutter.  The flutter was  terminated on the wound application of RF, as we drew the  catheter across  the cavotricuspid isthmus.  There was persistent of cavotricuspid isthmus  conduction that was initially eliminated very close to the tricuspid  annulus, and then, with a subsequent episode of recurrence, there was  elimination in a small pocket just posterior to that same region.  These  areas were clearly distinct, based on the impedances associated with RF  energy applied at them.   Following these applications of RF energy, further cavotricuspid isthmus  conduction could not be demonstrated.   Because of the patient's antecedent history of PAT some years ago, and the  recurrently inducible, albeit relatively slow arrhythmia that appeared to be  slow pathway dependent, RF energy was applied at regions P1 in the Ames  scale.  Junctional rhythm ensued.  Because she also had significant  prolonged prolongation in the sinus rhythm at variable and unpredictable  times, it was elected not to apply further RF energy here.   Fluoroscopy time: A total of 6 minutes and 46 seconds of fluoroscopy time  was utilized at 7 1/2 frames per second.  Unfortunately, the time is  occluded by the sticker.   IMPRESSION:  1. Normal sinus function.  2. Abnormal atrial function manifested by sustained atrial flutter.  This      was cavotricuspid isthmus dependent, and radiofrequency energy      successfully terminated flutter and eliminated the substrate of this      arrhythmia. 3. Dual antegrade AV nodal physiology, with inducible, probable slow      variants of  slow-fast and slow-intermediate.  AV nodal dependent      tachyarrhythmias.  Slow pathway modification was pursued with only mild      vigor.  4. Normal His-Purkinje system function; however, the patient had a      bifascicular block with right bundle branch block, left axis deviation.  5. No accessory pathway.  6. Normal ventricular response to programmed stimulation.   SUMMARY:  The results of the electrophysiological testing confirm  cavotricuspid isthmus-dependent flutter as the patient's primary arrhythmia  mechanism.  The patient had an antecedent history of PAT some years ago, and  this is likely AV nodal dependent, based on what we found.  As we were able  to induce these unusual variants of AV nodal tachycardia, a slow pathway  modification was undertaken with, as described above, only moderate  vigor.   We will plan to watch the patient overnight, continue on her Coumadin.  This  will potentially be discontinued by Dr. Deborah Chalk down the road.           ______________________________  Duke Salvia, MD, [facility]     SCK/MEDQ  D:  08/08/2006  T:  08/08/2006  Job:  [id number]   cc:   Colleen Can. Deborah Chalk, M.D.  Electrophysiology Laboratory

## 2011-03-16 NOTE — Letter (Signed)
August 27, 2006    Larina Earthly, M.D.  [address]  [place], [place] [address]   RE:  SAREEN, RANDON  MRN:  [id number]  /  DOB:  15-Oct-1925   Dear Tawanna Cooler,   Thanks again for your help with Ms. Caslin.  She comes in today, having seen  J.D. Hart Rochester and Fabienne Bruns in the last week or so.  There was some  aspiration of some serosanguineous fluid a week ago, and then she has  continued fluid pocket accumulation in her right medial proximal thigh.  She  is supposed to see you on Monday, and I thought at that point we would just  wait and see what your thoughts were.  She remains on Coumadin with an  adequate INR.  This may be important, as she has a hematoma and swelling in  the thigh.  I want to make sure she does not develop a DVT.   I will plan to also stop her Lanoxin today.  I will leave her Coreg  discontinuation to when she follows up with Dr. Deborah Chalk.   We will plan to see her again in about four weeks to make sure this thing  has fully resolved.   Todd, thanks again for your help with her.    Sincerely,     ______________________________  Duke Salvia, MD, [facility]    SCK/MedQ  DD: 08/27/2006  DT: 08/27/2006  Job #: [id number]

## 2011-03-16 NOTE — Letter (Signed)
September 24, 2006    Colleen Can. Deborah Chalk, M.D.  [address], [address]  [place]  [place] [address]   RE:  Debra Brooks, Debra Brooks  MRN:  [id number]  /  DOB:  [birth year]   Dear Weyman Croon,   [name] comes in today following her atrial flutter ablation that was  complicated by right femoral venous hematoma that required surgical closure.  She has finally fully recovered from this.  She is now back in independent  living and has been released from rehab.   She has had no recurrent tachy-palpitations.  As you recall, she has a  history of atrial flutter as well as remote history of SVT and, at the  ablation, we undertook cavotricuspid isthmus ablation as well as slow  pathway modification.   The question I think to address is whether the tachycardia that initiated  this new set of symptoms about 10 years ago at [facility] are representing  atrial fibrillation or atrial flutter.  In the event that it is the latter,  I think it would be reasonable at this juncture to think about  discontinuing the Coumadin.  In the event that it is the former with her  thromboembolic risk factors, I think it would be reasonable to maintain the  Coumadin.   If I can be of any further assistance in her care, please do not hesitate to  contact me.    Sincerely,      Duke Salvia, MD, [facility]  Electronically Signed    SCK/MedQ  DD: 09/24/2006  DT: 09/24/2006  Job #: [id number]   CC:    Fax without doctor's review

## 2011-03-16 NOTE — Procedures (Signed)
[place]  Patient:    Debra Brooks, Debra Brooks                       MRN: [id number] Proc. Date: 03/22/00 Adm. Date:  [id number] Disc. Date: [id number] Attending:  Rich Brave CC:         Titus Dubin. Alwyn Ren, M.D. [facility]             Dolan Amen, M.D.                           Procedure Report  PROCEDURE:  Colonoscopy.  INDICATION:  A 77 year old female now almost 10 years status post low anterior resection of a C1 rectal cancer, treated with surgery and chemotherapy, with her most recent colonoscopy, about five years ago, having been negative.  FINDINGS:  Normal exam to the cecum, status post low anterior resection.  DESCRIPTION OF PROCEDURE:  The nature, purpose, and risks of the procedure were familiar to the patient, who provided written consent.  Sedation was fentanyl 50 mcg and Versed 5 mg IV without arrhythmias or desaturation.  The Olympus PCF-160L adjustable-tension pediatric video colonoscope was easily advanced to the proximal colon, whereupon I had to get around a sharp angulation to advance the remainder of the way to the cecum, which was identified by transillumination of the right lower quadrant, visualization of the appendiceal orifice, and visualization of the ileocecal valve.  Pullback was then performed.  The quality of the prep was very good, and it was felt that all areas were adequately seen.  I did not see any polyps, cancer, diverticular disease, vascular malformations, or evidence of colitis during this exam.  Retroflexion of the rectum was unremarkable.  The colorectal anastomosis, located at about 8 cm from the external anal opening, was widely patent and without evidence of local recurrence of cancer or any inflammatory changes.  No biopsies were obtained during this exam.  The patient tolerated it well, and there were no apparent complications.  IMPRESSION:  Essentially normal colonoscopy.  A few diverticula  may have been present.  PLAN:  Consider follow-up examination in five years. DD:  03/22/00 TD:  03/26/00 Job: [id number]

## 2011-03-16 NOTE — Op Note (Signed)
NAME:  Debra Brooks, Debra Brooks                          ACCOUNT NO.:  [id number]   MEDICAL RECORD NO.:  [id number]                   PATIENT TYPE:  AMB   LOCATION:  DSC                                  FACILITY:  [facility]   PHYSICIAN:  Thornton Park. Daphine Deutscher, M.D.             DATE OF BIRTH:  August 06, 1925   DATE OF PROCEDURE:  12/07/2002  DATE OF DISCHARGE:                                 OPERATIVE REPORT   CCS# [id number]   PREOPERATIVE DIAGNOSIS:  Left breast cancer by percutaneous biopsy.   POSTOPERATIVE DIAGNOSIS:  Left breast cancer, pathology pending.   OPERATION PERFORMED:  Injection of blue dye, left axillary mapping and left  sentinel node biopsy, needle localized left breast lumpectomy.   SURGEON:  Thornton Park. Daphine Deutscher, M.D.   ANESTHESIA:  General by LMA.   INDICATIONS FOR PROCEDURE:  The patient is a 77 year old lady who recently  underwent an ultrasound guided biopsy of the left breast which was  demonstrated to be invasive carcinoma.  She was seen in the office and  informed consent was obtained regarding needle localization, left breast  biopsy and sentinel lymph node biopsy.   DESCRIPTION OF PROCEDURE:  The patient was taken to room 2 at [facility] and given general by LMA and the left breast was injected  with 2 cc of blue dye around the nipple and around the biopsy site which was  marked with an X overlying with a laterally placed wire.  Her left chest was  then prepped with Betadine widely and draped sterilely.  I then mapped the  axilla and then marked it and there was one spot with counts in the 100  range with background counts of 0.  In that area I made a curvilinear  incision from posterior to the pectoralis muscle margin and then entered the  axilla and used gentle dissection using hemostat and the Neoprobe to track  down the hot area.  Deep to the pectoralis major muscle I found a blue and  hot nodal cluster and excised that from the surrounding tissue  placing clips  on the lymphatics going into this and also onto the blood vessels.  I was  able to excise this in toto and it had highest counts in the 300 to 400  range again with background counts in the axilla now of 0.  This was sent as  a sentinel node for Touch Preps although it actually appeared to have at  least two nodes in this cluster.  Touch Preps were done which subsequently  came back by Dr. Clelia Croft as showing no evidence of tumor.  In the meantime, I  made a transverse incision overlying the area that had been marked.  I  raised superior and inferior flaps and then went medially and laterally wide  to this as well as inferiorly and superiorly and came down to the chest wall  and removed it from the chest wall.  I did this dissection with the Bovie.  As soon as I did this and had the wire coming out as well and seemed to get  the whole mass in toto.  I put markers to mark all aspects of the tumor and  wire and mass.  These were sent for specimen mammography which confirmed  that the specimen looked good and appeared to have good margins  radiographically.   I had not encountered anything in the dissection to indicate that I had  transected tumor.  Bleeding was controlled with the electrocautery.  I  irrigated with saline and studied this carefully and no bleeding was noted.  I did create somewhat of a defect there as I expected to do.  The area was  injected with some Marcaine and some skin was closed with 4-0 Vicryl  subcutaneously, subcuticular, with benzoin and Steri-Strips on the skin.  The axilla was closed in a similar fashion, again with 4-0 Vicryl previously  and also with Steri-Strips and benzoin.  Sterile dressing was applied.  The  patient seemed to tolerate the procedure well and was taken to the recovery  room in satisfactory condition.  She will be kept for overnight observation  here at [facility].  Final pathology report is pending.                                                 Thornton Park Daphine Deutscher, M.D.    MBM/MEDQ  D:  12/07/2002  T:  12/07/2002  Job:  [id number]

## 2011-03-16 NOTE — Cardiovascular Report (Signed)
NAMEYANETT, CONKRIGHT NO.:  [id number]   MEDICAL RECORD NO.:  [id number]          PATIENT TYPE:  OIB   LOCATION:  2899                         FACILITY:  [facility]   PHYSICIAN:  Colleen Can. Deborah Chalk, M.D.DATE OF BIRTH:  September 12, 1925   DATE OF PROCEDURE:  07/03/2006  DATE OF DISCHARGE:  07/03/2006                              CARDIAC CATHETERIZATION   PROCEDURE:  Cardioversion.   ANESTHESIA:  250 mg of IV Pentothal by Dr. Sharee Holster.   PROCEDURE:  Using biphasic energy, 150 joules with anterior and posterior  pads, a single shock converted Ms. Renville to normal sinus rhythm.  She  tolerated the procedure well.      Colleen Can. Deborah Chalk, M.D.  Electronically Signed     SNT/MEDQ  D:  07/03/2006  T:  07/03/2006  Job:  [id number]

## 2011-03-16 NOTE — H&P (Signed)
[name], [name]                ACCOUNT NO.:  [id number]   MEDICAL RECORD NO.:  [id number]           PATIENT TYPE:   LOCATION:                               FACILITY:  [facility]   PHYSICIAN:  Colleen Can. Deborah Chalk, M.D.DATE OF BIRTH:  [birth year]   DATE OF ADMISSION:  07/03/2006  DATE OF DISCHARGE:                                HISTORY & PHYSICAL   CHIEF COMPLAINT:  Fatigue and palpitations.   HISTORY OF PRESENT ILLNESS:  Debra Brooks is a very pleasant 77 year old white  female who has had recurrence of atrial flutter/fibrillation.  She is felt  to be intolerant to amiodarone.  She has had a history of past cardioversion  that dates back to 2005 and had been maintained on amiodarone up until May  of this year.  She had had a multitude of somatic complaints which improved  with the discontinuation of amiodarone.  Unfortunately, shortly after  discontinuation of the antiarrhythmic, she had recurrence of atrial flutter  within approximately 10 days.  She has been placed back on Coumadin  anticoagulation.  She has maintained therapeutic INRs over the course of the  past month and now presents for elective cardioversion.   ALLERGIES:  1. VALIUM.  2. X-RAY DYE.   CURRENT MEDICATIONS:  1. Coumadin currently taking 5 mg four days a week, 2.5 mg x3.  2. Synthroid 100 mcg daily.  3. Ambien p.r.n.  4. Fosamax weekly.  5. Xanax p.r.n.  6. Wellbutrin XL 300 mg a day.  7. Arimidex 1 mg a day.  8. Lipitor 10 daily.  9. Demadex 20 a day.  10.Lanoxin 0.25 half tablet daily.  11.Coreg 6.25 b.i.d.   PAST MEDICAL HISTORY:  1. History of paroxysmal atrial fibrillation/flutter.  She had successful      cardioversion in November 2005.  2. History of antiarrhythmic therapy with amiodarone, subsequently      discontinued in May 2007.  3. History of cardiomyopathy.  Her most recent 2-D echocardiogram      performed in May 2007, shows an ejection fraction of 55-60% with      borderline LVH and normal  LV systolic function.  There is moderate      tricuspid regurgitation with mild pulmonary hypertension and mild      mitral regurgitation.  4. History of breast cancer.  5. Carotid artery disease.  6. Hypertension.  7. Chronic lower extremity edema.  8. History of depression.  9. History of remote holiday heart syndrome approximately 30-40 years      ago that was associated with alcohol use.   PAST SURGICAL HISTORY:  1. Appendectomy.  2. Tonsillectomy.  3. Bladder tacking.  4. Partial colectomy.   FAMILY HISTORY:  Noncontributory.   SOCIAL HISTORY:  She is married.  She has no current alcohol or tobacco use.  She lives at [facility].   REVIEW OF SYSTEMS:  Basically as noted above.  She has been under a  significant amount of stress and has been very teary-eyed in regards to her  husband's current illness.  She has had some complaints of palpitations.  She has been maintained on Coumadin without problems.   PHYSICAL EXAMINATION:  GENERAL APPEARANCE:  She is a very pleasant, elderly  white female who appears younger than her stated age.  VITAL SIGNS:  Weight is 147 pounds, blood pressure 126/70 sitting, 120/70  standing, heart rate is 88 and irregular, respirations 18, she is afebrile.  SKIN:  Warm and dry.  Color is unremarkable.  LUNGS:  Basically clear.  CARDIOVASCULAR:  Irregular irregular rhythm.  ABDOMEN:  Soft, positive bowel sounds, nontender.  EXTREMITIES:  Without edema.  NEUROLOGIC:  No gross focal deficit.   PERTINENT LABORATORY DATA:  Pending.   OVERALL IMPRESSION:  1. Recurrent atrial fibrillation.  2. Past history of cardioversion.  3. History of cardiomyopathy.  4. Anxiety and depression.  5. History of breast cancer.  6. Hyperlipidemia.  7. History of hypertension.  8. Intolerance to amiodarone.   PLAN:  Will proceed on with attempts at outpatient cardioversion.  Procedure  has been reviewed in full detail and she is willing to proceed on  Wednesday,  July 03, 2006.  The case number is [id number].      Debra Brooks, N.P.      Colleen Can. Deborah Chalk, M.D.  Electronically Signed    LC/MEDQ  D:  07/02/2006  T:  07/02/2006  Job:  [id number]

## 2011-03-30 ENCOUNTER — Encounter: Payer: Self-pay | Admitting: Cardiology

## 2011-03-30 ENCOUNTER — Ambulatory Visit (INDEPENDENT_AMBULATORY_CARE_PROVIDER_SITE_OTHER): Payer: Medicare Other | Admitting: Cardiology

## 2011-03-30 DIAGNOSIS — E785 Hyperlipidemia, unspecified: Secondary | ICD-10-CM

## 2011-03-30 DIAGNOSIS — I10 Essential (primary) hypertension: Secondary | ICD-10-CM

## 2011-03-30 DIAGNOSIS — Z8679 Personal history of other diseases of the circulatory system: Secondary | ICD-10-CM

## 2011-03-30 DIAGNOSIS — I2510 Atherosclerotic heart disease of native coronary artery without angina pectoris: Secondary | ICD-10-CM

## 2011-03-30 DIAGNOSIS — Z9889 Other specified postprocedural states: Secondary | ICD-10-CM

## 2011-03-30 HISTORY — DX: Hyperlipidemia, unspecified: E78.5

## 2011-03-30 HISTORY — DX: Other specified postprocedural states: Z98.890

## 2011-03-30 HISTORY — DX: Atherosclerotic heart disease of native coronary artery without angina pectoris: I25.10

## 2011-03-30 HISTORY — DX: Personal history of other diseases of the circulatory system: Z86.79

## 2011-03-30 NOTE — Assessment & Plan Note (Signed)
Overall, I think she's recently stable. She is doing good job risk factor modification. Her last stress test was in may of 2011 showed normal myocardial perfusion but with frequent PVCs. She's not had any recurrence of her atrial flutter. She's not had any recurrence of her symptomatic heart failure that was felt to be more related to her atrial flutter. Overall, she's doing reasonably well. I will have her see Lawson Fiscal in 2 months and thereafter will have follow with Dr. Marca Ancona

## 2011-03-30 NOTE — Progress Notes (Signed)
Subjective:   Debra Brooks seen today for followup visit.  Her husband died on Christmas Day and in general she's doing reasonably well he was relatively peaceful death.  In general, she's been doing reasonably well but she's had some vague left arm discomfort. She's had some indigestion but is clear most of her symptoms are GI in origin. She's not having any diaphoresis or significant shortness of breath. She has chronic lower extremity edema treated with support hose.  She had a history of atrial flutter with ablation in 2007. She is amiodarone intolerance. She has known moderate tricuspid regurgitation with overall global LV ejection fraction of 55-60%. She had cardiac catheterization which showed 60% ostial right coronary artery lesion.  She has cerebrovascular disease, lower extremity edema, anxiety and depression, dyslipidemia, and hypothyroidism. She's had a history of breast cancer as well as partial colectomy.  Current Outpatient Prescriptions  Medication Sig Dispense Refill  . ALPRAZolam (XANAX) 0.5 MG tablet Take 0.25 mg by mouth 2 (two) times daily.       Marland Kitchen aspirin 81 MG tablet Take 81 mg by mouth daily.        Marland Kitchen atorvastatin (LIPITOR) 10 MG tablet Take 5 mg by mouth daily.       Marland Kitchen buPROPion (WELLBUTRIN XL) 300 MG 24 hr tablet Take 300 mg by mouth daily.        . Calcium Carbonate-Vitamin D (CALCIUM + D PO) Take 2,000 Units by mouth daily.        . carvedilol (COREG) 12.5 MG tablet Take 12.5 mg by mouth 2 (two) times daily with a meal.        . Cholecalciferol (VITAMIN D3) 1000 UNITS CAPS Take by mouth daily.        . digoxin (LANOXIN) 0.25 MG tablet Take 250 mcg by mouth daily. 1/2 daily       . fish oil-omega-3 fatty acids 1000 MG capsule Take by mouth daily.        . furosemide (LASIX) 40 MG tablet Take 40 mg by mouth 2 (two) times daily.        Marland Kitchen HYDROcodone-acetaminophen (LORCET) 10-650 MG per tablet Take 1 tablet by mouth 2 (two) times daily. 1/2 bid       . levothyroxine (SYNTHROID,  LEVOTHROID) 75 MCG tablet Take 75 mcg by mouth daily.        Marland Kitchen losartan (COZAAR) 50 MG tablet Take 50 mg by mouth daily.        . Multiple Vitamin (MULTIVITAMIN) tablet Take 1 tablet by mouth daily.        . nitroGLYCERIN (NITROSTAT) 0.4 MG SL tablet Place 0.4 mg under the tongue every 5 (five) minutes as needed.        . Polyethylene Glycol 3350 (MIRALAX PO) Take by mouth daily.        . pregabalin (LYRICA) 50 MG capsule Take 50 mg by mouth 2 (two) times daily.          Allergies  Allergen Reactions  . Iodinated Diagnostic Agents     Patient Active Problem List  Diagnoses  . DEGENERATIVE JOINT DISEASE, KNEES, BILATERAL  . HIP PAIN, LEFT  . KNEE PAIN, RIGHT  . SACROILIITIS, LEFT  . SCIATICA, LEFT  . GREATER TROCHANTERIC BURSITIS  . PES PLANUS  . UNSTEADY GAIT    History  Smoking status  . Former Smoker  . Quit date: 10/29/1985  Smokeless tobacco  . Not on file    History  Alcohol Use No  Family History  Problem Relation Age of Onset  . Cancer Mother   . Heart attack Father     Review of Systems:   The patient denies any heat or cold intolerance.  No weight gain or weight loss.  The patient denies headaches or blurry vision.  There is no cough or sputum production.  The patient denies dizziness.  There is no hematuria or hematochezia.  The patient denies any muscle aches or arthritis.  The patient denies any rash.  The patient denies frequent falling or instability.  There is no history of depression or anxiety.  All other systems were reviewed and are negative.   Physical Exam:   Vital signs are reviewed. Her weights 159. Blood pressure is 112/70. Heart rate 56 with frequent ectopics but essentially a regular rhythm.The head is normocephalic and atraumatic.  Pupils are equally round and reactive to light.  Sclerae nonicteric.  Conjunctiva is clear.  Oropharynx is unremarkable.  There's adequate oral airway.  Neck is supple there are no masses.  Thyroid is not  enlarged.  There is no lymphadenopathy.  Lungs are clear.  Chest is symmetric.  Heart shows a regular rate and rhythm.  S1 and S2 are normal.  There is no murmur click or gallop.  Abdomen is soft normal bowel sounds.  There is no organomegaly.  Genital and rectal deferred.  Extremities are without edema.support stockings are in place. Lower extremities are somewhat fleshy.  Peripheral pulses are adequate.  Neurologically intact.  Full range of motion.  The patient is not depressed.  Skin is warm and dry.  Assessment / Plan:

## 2011-04-30 ENCOUNTER — Other Ambulatory Visit: Payer: Self-pay | Admitting: Gynecology

## 2011-04-30 DIAGNOSIS — R1900 Intra-abdominal and pelvic swelling, mass and lump, unspecified site: Secondary | ICD-10-CM

## 2011-05-03 ENCOUNTER — Other Ambulatory Visit: Payer: Self-pay | Admitting: Gynecology

## 2011-05-03 DIAGNOSIS — N9489 Other specified conditions associated with female genital organs and menstrual cycle: Secondary | ICD-10-CM

## 2011-05-15 ENCOUNTER — Ambulatory Visit
Admission: RE | Admit: 2011-05-15 | Discharge: 2011-05-15 | Disposition: A | Discharge status: Home / Self Care | Payer: Medicare Other | Source: Ambulatory Visit | Attending: Gynecology | Admitting: Gynecology

## 2011-05-15 ENCOUNTER — Other Ambulatory Visit: Payer: Medicare Other

## 2011-05-15 DIAGNOSIS — N9489 Other specified conditions associated with female genital organs and menstrual cycle: Secondary | ICD-10-CM

## 2011-05-15 MED ORDER — GADOBENATE DIMEGLUMINE 529 MG/ML IV SOLN: 14.0000 mL | Freq: Once | INTRAVENOUS | Status: AC | PRN

## 2011-05-28 ENCOUNTER — Telehealth: Payer: Self-pay | Admitting: Nurse Practitioner

## 2011-05-28 NOTE — Telephone Encounter (Signed)
Pt would like a call back regarding having possible surgery, pt's son is in town from [place] to assist with appt later this afternoon at 3pm with another doctor about surgery and would like [place] to call back today

## 2011-05-30 ENCOUNTER — Encounter: Payer: Self-pay | Admitting: Nurse Practitioner

## 2011-05-30 ENCOUNTER — Ambulatory Visit (INDEPENDENT_AMBULATORY_CARE_PROVIDER_SITE_OTHER): Payer: Medicare Other | Admitting: Nurse Practitioner

## 2011-05-30 VITALS — BP 112/68 | HR 62 | Ht 64.5 in | Wt 158.0 lb

## 2011-05-30 DIAGNOSIS — E785 Hyperlipidemia, unspecified: Secondary | ICD-10-CM

## 2011-05-30 DIAGNOSIS — N838 Other noninflammatory disorders of ovary, fallopian tube and broad ligament: Secondary | ICD-10-CM

## 2011-05-30 DIAGNOSIS — N839 Noninflammatory disorder of ovary, fallopian tube and broad ligament, unspecified: Secondary | ICD-10-CM

## 2011-05-30 DIAGNOSIS — I2510 Atherosclerotic heart disease of native coronary artery without angina pectoris: Secondary | ICD-10-CM

## 2011-05-30 NOTE — Assessment & Plan Note (Signed)
She will check with Claudette at [facility] about recent labs.

## 2011-05-30 NOTE — Progress Notes (Signed)
Debra Brooks Date of Birth: [birth year]   History of Present Illness: Debra Brooks is seen today for a 2 month check. She is seen for Dr. Shirlee Latch. She is a former patient of Dr. Ronnald Nian. Her 3rd husband died on Christmas day. She has had 2 husbands die on Christmas Day. She seems to be doing ok. She is here with her son Debra Brooks. She is not having chest pain. She is not short of breath. She is not having palpitations. She is not sure if she has had recent labs but sees Claudette at [facility] regularly. She has recently been noted to have an ovarian mass, possibly malignant. Her Ca 125 marker was slightly elevated. She has opted to not have any surgery. She has had prior breast and colon surgery. She is not interested in chemo at all. She says she is happy with her life and at 86 years she is not interested in further treatments.   Current Outpatient Prescriptions on File Prior to Visit  Medication Sig Dispense Refill  . ALPRAZolam (XANAX) 0.5 MG tablet Take 0.25 mg by mouth 2 (two) times daily.       Marland Kitchen aspirin 81 MG tablet Take 81 mg by mouth daily.        Marland Kitchen atorvastatin (LIPITOR) 10 MG tablet Take 5 mg by mouth daily.       Marland Kitchen buPROPion (WELLBUTRIN XL) 300 MG 24 hr tablet Take 300 mg by mouth daily.        . Calcium Carbonate-Vitamin D (CALCIUM + D PO) Take 2,000 Units by mouth daily.        . carvedilol (COREG) 12.5 MG tablet Take 12.5 mg by mouth 2 (two) times daily with a meal.        . Cholecalciferol (VITAMIN D3) 1000 UNITS CAPS Take by mouth daily.        . digoxin (LANOXIN) 0.25 MG tablet Take 250 mcg by mouth daily. 1/2 daily       . fish oil-omega-3 fatty acids 1000 MG capsule Take by mouth daily.        . furosemide (LASIX) 40 MG tablet Take 40 mg by mouth 2 (two) times daily.        Marland Kitchen HYDROcodone-acetaminophen (LORCET) 10-650 MG per tablet Take 1 tablet by mouth 2 (two) times daily. 1/2 bid       . levothyroxine (SYNTHROID, LEVOTHROID) 75 MCG tablet Take 75 mcg by mouth daily.        Marland Kitchen  losartan (COZAAR) 50 MG tablet Take 50 mg by mouth daily.        . Multiple Vitamin (MULTIVITAMIN) tablet Take 1 tablet by mouth daily.        . nitroGLYCERIN (NITROSTAT) 0.4 MG SL tablet Place 0.4 mg under the tongue every 5 (five) minutes as needed.        . Polyethylene Glycol 3350 (MIRALAX PO) Take by mouth daily.        . pregabalin (LYRICA) 50 MG capsule Take 50 mg by mouth 2 (two) times daily.          Allergies  Allergen Reactions  . Iodinated Diagnostic Agents     Past Medical History  Diagnosis Date  . Hyperlipidemia   . Hypertension   . IHD (ischemic heart disease)     Cath in 2010 showed 60% ostial RCA lesion. She is managed medically  . Fatigue   . Palpitations   . Joint pain   . Anxiety and depression   .  Breast cancer   . History of colon cancer   . Tricuspid regurgitation     ef 55-60%  . Atrial flutter     s/p ablation in 2007; She is intolerant to amiodarone  . Chronic edema     Past Surgical History  Procedure Date  . Cardiac catheterization 2010    60% ostial RCA lesion. Managed medically  . Tonsillectomy   . Appendectomy   . Colectomy   . Breast surgery   . Bladder tacking     History  Smoking status  . Former Smoker  . Quit date: 10/29/1985  Smokeless tobacco  . Not on file    History  Alcohol Use No    Family History  Problem Relation Age of Onset  . Cancer Mother   . Heart attack Father     Review of Systems: The review of systems is positive for chronic anxiety. She seems to be sleeping ok. No chest pain.  All other systems were reviewed and are negative.  Physical Exam: BP 112/68  Pulse 62  Ht 5' 4.5" (1.638 m)  Wt 158 lb (71.668 kg)  BMI 26.70 kg/m2 Patient is very pleasant and in no acute distress. Skin is warm and dry. Color is normal.  HEENT is unremarkable. Normocephalic/atraumatic. PERRL. Sclera are nonicteric. Neck is supple. No masses. No JVD. Lungs are clear. Cardiac exam shows a regular rate and rhythm. Abdomen  is soft. Extremities are full with chronic edema. Support stockings are in place. Gait and ROM are intact. No gross neurologic deficits noted.  LABORATORY DATA:   Assessment / Plan:

## 2011-05-30 NOTE — Assessment & Plan Note (Signed)
This is suspicious for neoplasm. She is not interested in surgical intervention. She is at peace with her decision.

## 2011-05-30 NOTE — Patient Instructions (Addendum)
Stay on your current medicines. Check with Claudette about recent labs I will have you see Dr. Marca Ancona in 4 months Call for any problems

## 2011-05-30 NOTE — Assessment & Plan Note (Signed)
She has had cath back in 2010. She has a 60% ostial RCA lesion. She is managed medically. She is not having chest pain. We will keep her on her current medicines. We will have her see Dr. Shirlee Latch in about 4 months.

## 2011-06-04 NOTE — Progress Notes (Signed)
Agree with note.  Everlee Quakenbush  

## 2011-06-19 ENCOUNTER — Other Ambulatory Visit: Payer: Self-pay | Admitting: *Deleted

## 2011-06-19 MED ORDER — LOSARTAN POTASSIUM 50 MG PO TABS: 50.0000 mg | ORAL_TABLET | Freq: Every day | ORAL | Status: DC

## 2011-06-19 NOTE — Telephone Encounter (Signed)
Fax received from pharmacy. Refill completed. Jodette Tamra Koos RN  

## 2011-06-20 ENCOUNTER — Other Ambulatory Visit: Payer: Self-pay | Admitting: *Deleted

## 2011-09-18 ENCOUNTER — Encounter: Payer: Self-pay | Admitting: Cardiology

## 2011-10-01 ENCOUNTER — Ambulatory Visit (INDEPENDENT_AMBULATORY_CARE_PROVIDER_SITE_OTHER): Payer: Medicare Other | Admitting: Cardiology

## 2011-10-01 ENCOUNTER — Encounter: Payer: Self-pay | Admitting: Cardiology

## 2011-10-01 VITALS — BP 126/60 | HR 53 | Ht 66.0 in | Wt 155.0 lb

## 2011-10-01 DIAGNOSIS — I2510 Atherosclerotic heart disease of native coronary artery without angina pectoris: Secondary | ICD-10-CM

## 2011-10-01 DIAGNOSIS — Z8679 Personal history of other diseases of the circulatory system: Secondary | ICD-10-CM

## 2011-10-01 DIAGNOSIS — E785 Hyperlipidemia, unspecified: Secondary | ICD-10-CM

## 2011-10-01 DIAGNOSIS — Z9889 Other specified postprocedural states: Secondary | ICD-10-CM

## 2011-10-01 DIAGNOSIS — R609 Edema, unspecified: Secondary | ICD-10-CM | POA: Insufficient documentation

## 2011-10-01 DIAGNOSIS — R0989 Other specified symptoms and signs involving the circulatory and respiratory systems: Secondary | ICD-10-CM

## 2011-10-01 NOTE — Patient Instructions (Addendum)
Stop digoxin(lanoxin).  You have the prescription for compression stockings--waist  high 20-36mmHg  Your physician has requested that you have a carotid duplex. This test is an ultrasound of the carotid arteries in your neck. It looks at blood flow through these arteries that supply the brain with blood. Allow one hour for this exam. There are no restrictions or special instructions.  Your physician wants you to follow-up in: 6 months with Dr Shirlee Latch.(June 2013). You will receive a reminder letter in the mail two months in advance. If you don't receive a letter, please call our office to schedule the follow-up appointment.

## 2011-10-01 NOTE — Assessment & Plan Note (Signed)
LDL at goal when checked in 11/12 (< 70 given CAD).

## 2011-10-01 NOTE — Progress Notes (Signed)
PCP: Dr. Murray Brooks  77 yo with history of atrial flutter s/p ablation and CAD presents for cardiology followup.  She has seen Dr. Deborah Brooks in the past and is seeing me for the first time today.  She has been doing fine from a cardiopulmonary standpoint.  She does yoga 3 times a week.  She walks with a cane but denies exertional dyspnea.  No chest pain.  No tachypalpitations.  She is planning to go to [place] to visit her daughter for Christmas.  Unfortunately, she had a step-son pass away recently.  Her husband died last year at Christmas.    She had an ovarian mass noted this year.  CA-125 was mildly elevated.  She has opted for conservative management of this (no surgery, no chemotherapy).  She has had no abdominal pain.   ECG: NSR, LAFB, RBBB  Labs (11/12): K 4.1, creatinine 0.95, LDL 37, HDL 43, digoxin 0.6  PMH: 1. Atrial flutter: s/p ablation in 2007.  Intolerant to amiodarone.   2. CAD: LHC (2010) with 60% ostial RCA stenosis.  Myoview in 5/11 with normal perfusion.    3. Depression 4. Anxiety 5. Hyperlipidemia 6. Hypothyroidism 7. Chronic lower extremity edema.  8. Breast cancer.  9. Partial colectomy 10. Diastolic CHF in the setting of atrial flutter. Echo with EF 55-60%, moderate TR. 11. Ovarian mass of uncertain etiology.   SH: Lives at [facility].  Widowed.  5 children.  Quit smoking in 1987, no ETOH.   FH: Father with MI  ROS: All systems reviewed and negative except as per HPI.   Current Outpatient Prescriptions  Medication Sig Dispense Refill  . ALPRAZolam (XANAX) 0.5 MG tablet Take 0.25 mg by mouth 2 (two) times daily.       Marland Kitchen aspirin 81 MG tablet Take 81 mg by mouth daily.        Marland Kitchen atorvastatin (LIPITOR) 10 MG tablet Take 10 mg by mouth daily.       Marland Kitchen buPROPion (WELLBUTRIN XL) 300 MG 24 hr tablet Take 300 mg by mouth daily.        . Calcium Carbonate-Vitamin D (CALCIUM + D PO) Take 2,000 Units by mouth daily.        . carvedilol (COREG) 12.5 MG tablet Take  12.5 mg by mouth 2 (two) times daily with a meal.        . Cholecalciferol (VITAMIN D3) 1000 UNITS CAPS Take by mouth daily.        . fish oil-omega-3 fatty acids 1000 MG capsule Take by mouth daily.        . furosemide (LASIX) 40 MG tablet Take 40 mg by mouth 2 (two) times daily.        Marland Kitchen HYDROcodone-acetaminophen (LORCET) 10-650 MG per tablet Take 1 tablet by mouth 2 (two) times daily. 1/2 bid       . levothyroxine (SYNTHROID, LEVOTHROID) 75 MCG tablet Take 75 mcg by mouth daily.        Marland Kitchen losartan (COZAAR) 50 MG tablet Take 1 tablet (50 mg total) by mouth daily.  30 tablet  5  . Multiple Vitamin (MULTIVITAMIN) tablet Take 1 tablet by mouth daily.        . nitroGLYCERIN (NITROSTAT) 0.4 MG SL tablet Place 0.4 mg under the tongue every 5 (five) minutes as needed.        . Polyethylene Glycol 3350 (MIRALAX PO) Take by mouth daily.        . pregabalin (LYRICA) 50 MG capsule Take 50  mg by mouth 2 (two) times daily.          BP 126/60  Pulse 53  Ht 5\' 6"  (1.676 m)  Wt 70.308 kg (155 lb)  BMI 25.02 kg/m2 General: NAD Neck: No JVD, no thyromegaly or thyroid nodule.  Lungs: Clear to auscultation bilaterally with normal respiratory effort. CV: Nondisplaced PMI.  Heart regular S1/S2, no S3/S4, no murmur.  Bilateral lower leg nonpitting edema.  Righ carotid bruit.  Normal pedal pulses.  Abdomen: Soft, nontender, no hepatosplenomegaly, no distention.  Neurologic: Alert and oriented x 3.  Psych: Normal affect. Extremities: No clubbing or cyanosis.

## 2011-10-01 NOTE — Assessment & Plan Note (Signed)
Stable with no ischemic symptoms.  Myoview in 5/11 with no ischemia or infarction.  Continue ASA 81, atorvastatin, Coreg, ARB.

## 2011-10-01 NOTE — Assessment & Plan Note (Signed)
Bilateral non-pitting lower extremity edema seems like lymphedema.  Agree with compression stockings.  JVP is not elevated.  I do not think that she needs Lasix.

## 2011-10-01 NOTE — Assessment & Plan Note (Signed)
Patient is sinus rhythm today.  No tachypalpitations.  I do not think that she needs digoxin (normal EF, no recent documented atrial arrhythmia).  She can stop digoxin.

## 2011-10-02 DIAGNOSIS — R0989 Other specified symptoms and signs involving the circulatory and respiratory systems: Secondary | ICD-10-CM | POA: Insufficient documentation

## 2011-10-02 NOTE — Assessment & Plan Note (Signed)
Right carotid bruit.  Will assess with carotid dopplers.

## 2011-10-05 ENCOUNTER — Telehealth: Payer: Self-pay | Admitting: Cardiology

## 2011-10-05 NOTE — Telephone Encounter (Signed)
I spoke with Debra Brooks at [facility]. Pt says that she wears the 30-68mmHg compression hose. Pt has hose already and knows it will be Monday before they can be ordered. Will await call back on Monday from [facility] about compression Debra Red RN

## 2011-10-05 NOTE — Telephone Encounter (Signed)
New message:  The prescription is wrong on the compression level of stockings.  Please fax the 30-40 to [phone].

## 2011-10-08 NOTE — Telephone Encounter (Signed)
I will fax to [facility].

## 2011-10-08 NOTE — Telephone Encounter (Signed)
I talked with Debra Brooks at [facility] has been getting compression hose 30-40 mmHg. I will forward to Dr Shirlee Latch for [facility] for compression hose 30-75mmHg. I will fax order to  [facility] - [phone]- after getting order  from Dr Shirlee Latch.

## 2011-10-08 NOTE — Telephone Encounter (Signed)
She can have the correct stockings.  Will rewrite prescription.

## 2011-10-29 ENCOUNTER — Encounter: Payer: Medicare Other | Admitting: *Deleted

## 2011-10-30 DIAGNOSIS — K5900 Constipation, unspecified: Secondary | ICD-10-CM

## 2011-10-30 HISTORY — DX: Constipation, unspecified: K59.00

## 2011-11-02 ENCOUNTER — Encounter: Payer: Medicare Other | Admitting: *Deleted

## 2011-11-02 DIAGNOSIS — R1909 Other intra-abdominal and pelvic swelling, mass and lump: Secondary | ICD-10-CM | POA: Diagnosis not present

## 2011-11-06 ENCOUNTER — Other Ambulatory Visit: Payer: Self-pay | Admitting: Cardiology

## 2011-11-07 DIAGNOSIS — K591 Functional diarrhea: Secondary | ICD-10-CM | POA: Diagnosis not present

## 2011-11-07 DIAGNOSIS — R198 Other specified symptoms and signs involving the digestive system and abdomen: Secondary | ICD-10-CM | POA: Diagnosis not present

## 2011-11-07 DIAGNOSIS — R159 Full incontinence of feces: Secondary | ICD-10-CM | POA: Diagnosis not present

## 2011-11-07 NOTE — Telephone Encounter (Signed)
Debra Brooks, Can you look into this? Thanks.

## 2011-11-13 ENCOUNTER — Encounter: Payer: Medicare Other | Admitting: *Deleted

## 2011-11-13 ENCOUNTER — Encounter (INDEPENDENT_AMBULATORY_CARE_PROVIDER_SITE_OTHER): Payer: Medicare Other | Admitting: *Deleted

## 2011-11-13 DIAGNOSIS — R0989 Other specified symptoms and signs involving the circulatory and respiratory systems: Secondary | ICD-10-CM | POA: Diagnosis not present

## 2011-11-20 ENCOUNTER — Telehealth: Payer: Self-pay | Admitting: Cardiology

## 2011-11-30 ENCOUNTER — Other Ambulatory Visit: Payer: Self-pay | Admitting: Oncology

## 2011-11-30 DIAGNOSIS — Z1231 Encounter for screening mammogram for malignant neoplasm of breast: Secondary | ICD-10-CM

## 2011-12-01 ENCOUNTER — Telehealth: Payer: Self-pay | Admitting: Oncology

## 2011-12-01 NOTE — Telephone Encounter (Signed)
S/w pt re appts for 3/29 and mammo for 2/19.

## 2011-12-06 ENCOUNTER — Other Ambulatory Visit: Payer: Self-pay | Admitting: Cardiology

## 2011-12-18 ENCOUNTER — Ambulatory Visit
Admission: RE | Admit: 2011-12-18 | Discharge: 2011-12-18 | Disposition: A | Discharge status: Home / Self Care | Payer: Medicare Other | Source: Ambulatory Visit | Attending: Oncology | Admitting: Oncology

## 2011-12-18 DIAGNOSIS — Z1231 Encounter for screening mammogram for malignant neoplasm of breast: Secondary | ICD-10-CM | POA: Diagnosis not present

## 2011-12-21 ENCOUNTER — Telehealth: Payer: Self-pay | Admitting: Oncology

## 2011-12-21 NOTE — Telephone Encounter (Signed)
Talked to pt, gave her appt for march 2013 lab and MD

## 2012-01-07 ENCOUNTER — Other Ambulatory Visit: Payer: Self-pay | Admitting: *Deleted

## 2012-01-07 MED ORDER — LOSARTAN POTASSIUM 50 MG PO TABS: 50.0000 mg | ORAL_TABLET | Freq: Every day | ORAL | Status: DC

## 2012-01-09 DIAGNOSIS — K591 Functional diarrhea: Secondary | ICD-10-CM | POA: Diagnosis not present

## 2012-01-16 ENCOUNTER — Telehealth: Payer: Self-pay | Admitting: Oncology

## 2012-01-16 NOTE — Telephone Encounter (Signed)
Pt called today to r/s appt for 3/29. Pt given new appt for 4/12 @ 3pm. Pt will keep lb appt for 3/21.

## 2012-01-17 ENCOUNTER — Other Ambulatory Visit (HOSPITAL_BASED_OUTPATIENT_CLINIC_OR_DEPARTMENT_OTHER): Payer: Medicare Other

## 2012-01-17 DIAGNOSIS — C50919 Malignant neoplasm of unspecified site of unspecified female breast: Secondary | ICD-10-CM

## 2012-01-17 LAB — CBC WITH DIFFERENTIAL/PLATELET
Basophils Absolute: 0.1 10*3/uL (ref 0.0–0.1)
Eosinophils Absolute: 0.4 10*3/uL (ref 0.0–0.5)
HGB: 12.5 g/dL (ref 11.6–15.9)
LYMPH%: 16.2 % (ref 14.0–49.7)
MCV: 94.8 fL (ref 79.5–101.0)
MONO%: 4.9 % (ref 0.0–14.0)
NEUT#: 6.5 10*3/uL (ref 1.5–6.5)
Platelets: 219 10*3/uL (ref 145–400)
RDW: 14.4 % (ref 11.2–14.5)

## 2012-01-17 LAB — COMPREHENSIVE METABOLIC PANEL
Albumin: 3.9 g/dL (ref 3.5–5.2)
Alkaline Phosphatase: 64 U/L (ref 39–117)
BUN: 21 mg/dL (ref 6–23)
Glucose, Bld: 115 mg/dL — ABNORMAL HIGH (ref 70–99)
Potassium: 3.7 mEq/L (ref 3.5–5.3)

## 2012-01-17 LAB — LACTATE DEHYDROGENASE: LDH: 160 U/L (ref 94–250)

## 2012-01-23 DIAGNOSIS — R141 Gas pain: Secondary | ICD-10-CM | POA: Diagnosis not present

## 2012-01-23 DIAGNOSIS — I4891 Unspecified atrial fibrillation: Secondary | ICD-10-CM | POA: Diagnosis not present

## 2012-01-23 DIAGNOSIS — F329 Major depressive disorder, single episode, unspecified: Secondary | ICD-10-CM | POA: Diagnosis not present

## 2012-01-23 DIAGNOSIS — I10 Essential (primary) hypertension: Secondary | ICD-10-CM | POA: Diagnosis not present

## 2012-01-23 DIAGNOSIS — R269 Unspecified abnormalities of gait and mobility: Secondary | ICD-10-CM | POA: Diagnosis not present

## 2012-01-25 ENCOUNTER — Ambulatory Visit: Payer: Medicare Other | Admitting: Oncology

## 2012-01-25 ENCOUNTER — Other Ambulatory Visit: Payer: Medicare Other | Admitting: Lab

## 2012-01-28 DIAGNOSIS — S2249XA Multiple fractures of ribs, unspecified side, initial encounter for closed fracture: Secondary | ICD-10-CM

## 2012-01-28 DIAGNOSIS — S2220XA Unspecified fracture of sternum, initial encounter for closed fracture: Secondary | ICD-10-CM

## 2012-01-28 HISTORY — DX: Unspecified fracture of sternum, initial encounter for closed fracture: S22.20XA

## 2012-01-28 HISTORY — DX: Multiple fractures of ribs, unspecified side, initial encounter for closed fracture: S22.49XA

## 2012-02-02 ENCOUNTER — Emergency Department (HOSPITAL_COMMUNITY): Payer: Medicare Other

## 2012-02-02 ENCOUNTER — Encounter (HOSPITAL_COMMUNITY): Payer: Self-pay | Admitting: Emergency Medicine

## 2012-02-02 ENCOUNTER — Inpatient Hospital Stay (HOSPITAL_COMMUNITY)
Admission: EM | Admit: 2012-02-02 | Discharge: 2012-02-06 | DRG: 183 | Disposition: A | Discharge status: Skilled Nursing Facility | Payer: Medicare Other | Attending: Surgery | Admitting: Surgery

## 2012-02-02 DIAGNOSIS — D62 Acute posthemorrhagic anemia: Secondary | ICD-10-CM | POA: Diagnosis present

## 2012-02-02 DIAGNOSIS — I10 Essential (primary) hypertension: Secondary | ICD-10-CM | POA: Diagnosis present

## 2012-02-02 DIAGNOSIS — S0990XA Unspecified injury of head, initial encounter: Secondary | ICD-10-CM | POA: Diagnosis not present

## 2012-02-02 DIAGNOSIS — F411 Generalized anxiety disorder: Secondary | ICD-10-CM | POA: Diagnosis present

## 2012-02-02 DIAGNOSIS — E039 Hypothyroidism, unspecified: Secondary | ICD-10-CM | POA: Diagnosis present

## 2012-02-02 DIAGNOSIS — R918 Other nonspecific abnormal finding of lung field: Secondary | ICD-10-CM | POA: Diagnosis not present

## 2012-02-02 DIAGNOSIS — S2239XA Fracture of one rib, unspecified side, initial encounter for closed fracture: Secondary | ICD-10-CM | POA: Diagnosis not present

## 2012-02-02 DIAGNOSIS — I509 Heart failure, unspecified: Secondary | ICD-10-CM | POA: Diagnosis present

## 2012-02-02 DIAGNOSIS — S2249XA Multiple fractures of ribs, unspecified side, initial encounter for closed fracture: Principal | ICD-10-CM | POA: Diagnosis present

## 2012-02-02 DIAGNOSIS — S0993XA Unspecified injury of face, initial encounter: Secondary | ICD-10-CM | POA: Diagnosis not present

## 2012-02-02 DIAGNOSIS — IMO0002 Reserved for concepts with insufficient information to code with codable children: Secondary | ICD-10-CM | POA: Diagnosis not present

## 2012-02-02 DIAGNOSIS — Y998 Other external cause status: Secondary | ICD-10-CM

## 2012-02-02 DIAGNOSIS — E785 Hyperlipidemia, unspecified: Secondary | ICD-10-CM | POA: Diagnosis present

## 2012-02-02 DIAGNOSIS — S2220XA Unspecified fracture of sternum, initial encounter for closed fracture: Secondary | ICD-10-CM | POA: Diagnosis present

## 2012-02-02 DIAGNOSIS — I4892 Unspecified atrial flutter: Secondary | ICD-10-CM | POA: Diagnosis present

## 2012-02-02 DIAGNOSIS — N9489 Other specified conditions associated with female genital organs and menstrual cycle: Secondary | ICD-10-CM | POA: Diagnosis not present

## 2012-02-02 DIAGNOSIS — S199XXA Unspecified injury of neck, initial encounter: Secondary | ICD-10-CM | POA: Diagnosis not present

## 2012-02-02 DIAGNOSIS — J690 Pneumonitis due to inhalation of food and vomit: Secondary | ICD-10-CM | POA: Diagnosis present

## 2012-02-02 DIAGNOSIS — I700 Atherosclerosis of aorta: Secondary | ICD-10-CM | POA: Diagnosis not present

## 2012-02-02 DIAGNOSIS — Z85038 Personal history of other malignant neoplasm of large intestine: Secondary | ICD-10-CM

## 2012-02-02 DIAGNOSIS — Z853 Personal history of malignant neoplasm of breast: Secondary | ICD-10-CM

## 2012-02-02 LAB — COMPREHENSIVE METABOLIC PANEL
AST: 31 U/L (ref 0–37)
Albumin: 3.7 g/dL (ref 3.5–5.2)
BUN: 26 mg/dL — ABNORMAL HIGH (ref 6–23)
Calcium: 9.8 mg/dL (ref 8.4–10.5)
Creatinine, Ser: 1.09 mg/dL (ref 0.50–1.10)
Total Bilirubin: 0.4 mg/dL (ref 0.3–1.2)
Total Protein: 6.6 g/dL (ref 6.0–8.3)

## 2012-02-02 LAB — POCT I-STAT, CHEM 8
Chloride: 102 mEq/L (ref 96–112)
Creatinine, Ser: 1.1 mg/dL (ref 0.50–1.10)
Glucose, Bld: 107 mg/dL — ABNORMAL HIGH (ref 70–99)
Potassium: 3.6 mEq/L (ref 3.5–5.1)

## 2012-02-02 LAB — LACTIC ACID, PLASMA: Lactic Acid, Venous: 3 mmol/L — ABNORMAL HIGH (ref 0.5–2.2)

## 2012-02-02 LAB — CBC
HCT: 40.9 % (ref 36.0–46.0)
Hemoglobin: 13.5 g/dL (ref 12.0–15.0)
MCV: 95.1 fL (ref 78.0–100.0)
Platelets: 239 10*3/uL (ref 150–400)
RBC: 4.3 MIL/uL (ref 3.87–5.11)
WBC: 8.8 10*3/uL (ref 4.0–10.5)

## 2012-02-02 LAB — PROTIME-INR: INR: 1.03 (ref 0.00–1.49)

## 2012-02-02 MED ORDER — IOHEXOL 300 MG/ML  SOLN
100.0000 mL | Freq: Once | INTRAMUSCULAR | Status: AC | PRN
  Administered 2012-02-02: 100 mL via INTRAVENOUS

## 2012-02-02 MED ORDER — ONDANSETRON HCL 4 MG/2ML IJ SOLN
4.0000 mg | Freq: Once | INTRAMUSCULAR | Status: AC
  Administered 2012-02-02: 4 mg via INTRAVENOUS

## 2012-02-02 MED ORDER — DIPHENHYDRAMINE HCL 50 MG/ML IJ SOLN
50.0000 mg | Freq: Once | INTRAMUSCULAR | Status: AC
  Administered 2012-02-02: 50 mg via INTRAVENOUS
  Filled 2012-02-02: qty 1

## 2012-02-02 MED ORDER — MORPHINE SULFATE 2 MG/ML IJ SOLN
INTRAMUSCULAR | Status: AC
  Filled 2012-02-02: qty 1

## 2012-02-02 MED ORDER — METHYLPREDNISOLONE SODIUM SUCC 125 MG IJ SOLR
125.0000 mg | Freq: Once | INTRAMUSCULAR | Status: AC
  Administered 2012-02-02: 125 mg via INTRAVENOUS
  Filled 2012-02-02: qty 2

## 2012-02-02 MED ORDER — MORPHINE SULFATE 2 MG/ML IJ SOLN
2.0000 mg | Freq: Once | INTRAMUSCULAR | Status: AC
  Administered 2012-02-02: 2 mg via INTRAVENOUS

## 2012-02-02 MED ORDER — ONDANSETRON HCL 4 MG/2ML IJ SOLN
INTRAMUSCULAR | Status: AC
  Filled 2012-02-02: qty 2

## 2012-02-02 MED ORDER — MORPHINE SULFATE 4 MG/ML IJ SOLN
4.0000 mg | Freq: Once | INTRAMUSCULAR | Status: AC
  Administered 2012-02-02: 4 mg via INTRAVENOUS

## 2012-02-02 MED ORDER — MORPHINE SULFATE 4 MG/ML IJ SOLN
INTRAMUSCULAR | Status: AC
  Filled 2012-02-02: qty 1

## 2012-02-02 NOTE — ED Notes (Signed)
Patient complaining of 10/10 pain.  MD notified.  New orders per Dr Preston Fleeting.
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
## 2012-02-02 NOTE — ED Notes (Signed)
CT notified of patient ready for CT.

## 2012-02-03 ENCOUNTER — Encounter (HOSPITAL_COMMUNITY): Payer: Self-pay | Admitting: *Deleted

## 2012-02-03 DIAGNOSIS — S0993XA Unspecified injury of face, initial encounter: Secondary | ICD-10-CM | POA: Diagnosis not present

## 2012-02-03 DIAGNOSIS — J690 Pneumonitis due to inhalation of food and vomit: Secondary | ICD-10-CM | POA: Diagnosis not present

## 2012-02-03 DIAGNOSIS — I509 Heart failure, unspecified: Secondary | ICD-10-CM | POA: Diagnosis not present

## 2012-02-03 DIAGNOSIS — E039 Hypothyroidism, unspecified: Secondary | ICD-10-CM | POA: Diagnosis present

## 2012-02-03 DIAGNOSIS — IMO0002 Reserved for concepts with insufficient information to code with codable children: Secondary | ICD-10-CM | POA: Diagnosis not present

## 2012-02-03 DIAGNOSIS — Z4789 Encounter for other orthopedic aftercare: Secondary | ICD-10-CM | POA: Diagnosis not present

## 2012-02-03 DIAGNOSIS — J984 Other disorders of lung: Secondary | ICD-10-CM | POA: Diagnosis not present

## 2012-02-03 DIAGNOSIS — R609 Edema, unspecified: Secondary | ICD-10-CM | POA: Diagnosis not present

## 2012-02-03 DIAGNOSIS — R5381 Other malaise: Secondary | ICD-10-CM | POA: Diagnosis not present

## 2012-02-03 DIAGNOSIS — I10 Essential (primary) hypertension: Secondary | ICD-10-CM | POA: Diagnosis not present

## 2012-02-03 DIAGNOSIS — Z853 Personal history of malignant neoplasm of breast: Secondary | ICD-10-CM | POA: Diagnosis not present

## 2012-02-03 DIAGNOSIS — I700 Atherosclerosis of aorta: Secondary | ICD-10-CM | POA: Diagnosis not present

## 2012-02-03 DIAGNOSIS — I369 Nonrheumatic tricuspid valve disorder, unspecified: Secondary | ICD-10-CM | POA: Diagnosis not present

## 2012-02-03 DIAGNOSIS — R0902 Hypoxemia: Secondary | ICD-10-CM | POA: Diagnosis not present

## 2012-02-03 DIAGNOSIS — S0990XA Unspecified injury of head, initial encounter: Secondary | ICD-10-CM | POA: Diagnosis not present

## 2012-02-03 DIAGNOSIS — D62 Acute posthemorrhagic anemia: Secondary | ICD-10-CM | POA: Diagnosis not present

## 2012-02-03 DIAGNOSIS — J90 Pleural effusion, not elsewhere classified: Secondary | ICD-10-CM | POA: Diagnosis not present

## 2012-02-03 DIAGNOSIS — I4892 Unspecified atrial flutter: Secondary | ICD-10-CM | POA: Diagnosis not present

## 2012-02-03 DIAGNOSIS — S2220XA Unspecified fracture of sternum, initial encounter for closed fracture: Secondary | ICD-10-CM | POA: Diagnosis not present

## 2012-02-03 DIAGNOSIS — R918 Other nonspecific abnormal finding of lung field: Secondary | ICD-10-CM | POA: Diagnosis not present

## 2012-02-03 DIAGNOSIS — S2239XA Fracture of one rib, unspecified side, initial encounter for closed fracture: Secondary | ICD-10-CM | POA: Diagnosis not present

## 2012-02-03 DIAGNOSIS — S2249XA Multiple fractures of ribs, unspecified side, initial encounter for closed fracture: Secondary | ICD-10-CM | POA: Diagnosis not present

## 2012-02-03 DIAGNOSIS — N9489 Other specified conditions associated with female genital organs and menstrual cycle: Secondary | ICD-10-CM | POA: Diagnosis not present

## 2012-02-03 DIAGNOSIS — Z85038 Personal history of other malignant neoplasm of large intestine: Secondary | ICD-10-CM | POA: Diagnosis not present

## 2012-02-03 DIAGNOSIS — F411 Generalized anxiety disorder: Secondary | ICD-10-CM | POA: Diagnosis present

## 2012-02-03 DIAGNOSIS — K5900 Constipation, unspecified: Secondary | ICD-10-CM | POA: Diagnosis not present

## 2012-02-03 DIAGNOSIS — E785 Hyperlipidemia, unspecified: Secondary | ICD-10-CM | POA: Diagnosis present

## 2012-02-03 LAB — CBC
HCT: 42.3 % (ref 36.0–46.0)
Hemoglobin: 13.7 g/dL (ref 12.0–15.0)
MCHC: 32.4 g/dL (ref 30.0–36.0)
RBC: 4.4 MIL/uL (ref 3.87–5.11)
WBC: 13.8 10*3/uL — ABNORMAL HIGH (ref 4.0–10.5)

## 2012-02-03 LAB — URINALYSIS, MICROSCOPIC ONLY
Bilirubin Urine: NEGATIVE
Glucose, UA: NEGATIVE mg/dL
Nitrite: NEGATIVE
Specific Gravity, Urine: 1.027 (ref 1.005–1.030)
pH: 7 (ref 5.0–8.0)

## 2012-02-03 LAB — MRSA PCR SCREENING: MRSA by PCR: NEGATIVE

## 2012-02-03 LAB — CREATININE, SERUM
Creatinine, Ser: 0.96 mg/dL (ref 0.50–1.10)
GFR calc Af Amer: 61 mL/min — ABNORMAL LOW (ref >90)
GFR calc non Af Amer: 53 mL/min — ABNORMAL LOW (ref >90)

## 2012-02-03 MED ORDER — ONDANSETRON HCL 4 MG/2ML IJ SOLN: 4.0000 mg | Freq: Four times a day (QID) | INTRAMUSCULAR | Status: DC | PRN

## 2012-02-03 MED ORDER — ONDANSETRON HCL 4 MG PO TABS: 4.0000 mg | ORAL_TABLET | Freq: Four times a day (QID) | ORAL | Status: DC | PRN

## 2012-02-03 MED ORDER — DIGOXIN 125 MCG PO TABS
125.0000 ug | ORAL_TABLET | Freq: Every day | ORAL | Status: DC
  Administered 2012-02-03 – 2012-02-06 (×4): 125 ug via ORAL
  Filled 2012-02-03 (×4): qty 1

## 2012-02-03 MED ORDER — MORPHINE SULFATE (PF) 1 MG/ML IV SOLN
INTRAVENOUS | Status: AC
  Administered 2012-02-03: 06:00:00
  Filled 2012-02-03: qty 25

## 2012-02-03 MED ORDER — MORPHINE SULFATE 2 MG/ML IJ SOLN: 1.0000 mg | INTRAMUSCULAR | Status: DC | PRN

## 2012-02-03 MED ORDER — PREGABALIN 25 MG PO CAPS
50.0000 mg | ORAL_CAPSULE | Freq: Two times a day (BID) | ORAL | Status: DC
  Administered 2012-02-03 – 2012-02-06 (×7): 50 mg via ORAL
  Filled 2012-02-03 (×4): qty 1
  Filled 2012-02-03: qty 2
  Filled 2012-02-03: qty 1
  Filled 2012-02-03: qty 2

## 2012-02-03 MED ORDER — HYDROCODONE-ACETAMINOPHEN 10-325 MG PO TABS
1.0000 | ORAL_TABLET | ORAL | Status: DC | PRN
  Administered 2012-02-03: 1 via ORAL
  Filled 2012-02-03: qty 1

## 2012-02-03 MED ORDER — HYDROCODONE-ACETAMINOPHEN 10-325 MG PO TABS
1.0000 | ORAL_TABLET | ORAL | Status: AC
  Administered 2012-02-03: 1 via ORAL
  Filled 2012-02-03: qty 1

## 2012-02-03 MED ORDER — LOSARTAN POTASSIUM 50 MG PO TABS
50.0000 mg | ORAL_TABLET | Freq: Every day | ORAL | Status: DC
  Administered 2012-02-03 – 2012-02-04 (×2): 50 mg via ORAL
  Filled 2012-02-03 (×2): qty 1

## 2012-02-03 MED ORDER — BISACODYL 10 MG RE SUPP: 10.0000 mg | Freq: Every day | RECTAL | Status: DC | PRN

## 2012-02-03 MED ORDER — ALBUTEROL SULFATE (5 MG/ML) 0.5% IN NEBU: 2.5000 mg | INHALATION_SOLUTION | RESPIRATORY_TRACT | Status: DC | PRN

## 2012-02-03 MED ORDER — ALPRAZOLAM 0.25 MG PO TABS
0.2500 mg | ORAL_TABLET | Freq: Two times a day (BID) | ORAL | Status: DC
  Administered 2012-02-03 – 2012-02-06 (×7): 0.25 mg via ORAL
  Filled 2012-02-03 (×8): qty 1

## 2012-02-03 MED ORDER — ATORVASTATIN CALCIUM 10 MG PO TABS
10.0000 mg | ORAL_TABLET | Freq: Every day | ORAL | Status: DC
  Administered 2012-02-03 – 2012-02-05 (×3): 10 mg via ORAL
  Filled 2012-02-03 (×4): qty 1

## 2012-02-03 MED ORDER — MORPHINE SULFATE (PF) 1 MG/ML IV SOLN
INTRAVENOUS | Status: AC
  Administered 2012-02-03: 11:00:00
  Filled 2012-02-03: qty 25

## 2012-02-03 MED ORDER — NITROGLYCERIN 0.4 MG SL SUBL: 0.4000 mg | SUBLINGUAL_TABLET | SUBLINGUAL | Status: DC | PRN

## 2012-02-03 MED ORDER — FUROSEMIDE 40 MG PO TABS
40.0000 mg | ORAL_TABLET | ORAL | Status: DC
  Administered 2012-02-04 – 2012-02-06 (×5): 40 mg via ORAL
  Filled 2012-02-03 (×7): qty 1

## 2012-02-03 MED ORDER — KETOROLAC TROMETHAMINE 15 MG/ML IJ SOLN
15.0000 mg | Freq: Once | INTRAMUSCULAR | Status: AC
  Administered 2012-02-03: 15 mg via INTRAVENOUS
  Filled 2012-02-03: qty 1

## 2012-02-03 MED ORDER — PANTOPRAZOLE SODIUM 40 MG PO TBEC
40.0000 mg | DELAYED_RELEASE_TABLET | Freq: Every day | ORAL | Status: DC
  Administered 2012-02-03: 40 mg via ORAL
  Filled 2012-02-03: qty 11

## 2012-02-03 MED ORDER — DOCUSATE SODIUM 100 MG PO CAPS
100.0000 mg | ORAL_CAPSULE | Freq: Two times a day (BID) | ORAL | Status: DC
  Administered 2012-02-03 – 2012-02-06 (×7): 100 mg via ORAL
  Filled 2012-02-03 (×9): qty 1

## 2012-02-03 MED ORDER — FUROSEMIDE 40 MG PO TABS
40.0000 mg | ORAL_TABLET | Freq: Once | ORAL | Status: AC
  Administered 2012-02-03: 40 mg via ORAL
  Filled 2012-02-03: qty 1

## 2012-02-03 MED ORDER — CARVEDILOL 12.5 MG PO TABS
12.5000 mg | ORAL_TABLET | Freq: Two times a day (BID) | ORAL | Status: DC
  Administered 2012-02-03 – 2012-02-06 (×6): 12.5 mg via ORAL
  Filled 2012-02-03 (×9): qty 1

## 2012-02-03 MED ORDER — FUROSEMIDE 40 MG PO TABS
40.0000 mg | ORAL_TABLET | Freq: Two times a day (BID) | ORAL | Status: DC
  Administered 2012-02-03: 40 mg via ORAL
  Filled 2012-02-03 (×2): qty 1

## 2012-02-03 MED ORDER — MORPHINE SULFATE 2 MG/ML IJ SOLN
2.0000 mg | Freq: Once | INTRAMUSCULAR | Status: AC
  Administered 2012-02-03: 2 mg via INTRAVENOUS
  Filled 2012-02-03: qty 1

## 2012-02-03 MED ORDER — NALOXONE HCL 0.4 MG/ML IJ SOLN: 0.4000 mg | INTRAMUSCULAR | Status: DC | PRN

## 2012-02-03 MED ORDER — MORPHINE SULFATE (PF) 1 MG/ML IV SOLN
INTRAVENOUS | Status: DC
  Administered 2012-02-03: 21 mg via INTRAVENOUS
  Administered 2012-02-03: 13.5 mg via INTRAVENOUS
  Administered 2012-02-03: 3 mg via INTRAVENOUS
  Administered 2012-02-03: 12.88 mg via INTRAVENOUS

## 2012-02-03 MED ORDER — LEVOTHYROXINE SODIUM 75 MCG PO TABS
75.0000 ug | ORAL_TABLET | Freq: Every day | ORAL | Status: DC
  Administered 2012-02-03 – 2012-02-06 (×4): 75 ug via ORAL
  Filled 2012-02-03 (×5): qty 1

## 2012-02-03 MED ORDER — KCL IN DEXTROSE-NACL 20-5-0.45 MEQ/L-%-% IV SOLN
INTRAVENOUS | Status: DC
  Administered 2012-02-03 – 2012-02-04 (×2): via INTRAVENOUS
  Filled 2012-02-03 (×3): qty 1000

## 2012-02-03 MED ORDER — DIPHENHYDRAMINE HCL 12.5 MG/5ML PO ELIX
12.5000 mg | ORAL_SOLUTION | Freq: Four times a day (QID) | ORAL | Status: DC | PRN
  Filled 2012-02-03 (×3): qty 5

## 2012-02-03 MED ORDER — ALPRAZOLAM 0.25 MG PO TABS
0.2500 mg | ORAL_TABLET | Freq: Once | ORAL | Status: AC
  Administered 2012-02-03: 0.25 mg via ORAL

## 2012-02-03 MED ORDER — DIPHENHYDRAMINE HCL 50 MG/ML IJ SOLN
12.5000 mg | Freq: Four times a day (QID) | INTRAMUSCULAR | Status: DC | PRN
  Administered 2012-02-03: 12.5 mg via INTRAVENOUS
  Filled 2012-02-03 (×2): qty 1

## 2012-02-03 MED ORDER — SODIUM CHLORIDE 0.9 % IJ SOLN: 9.0000 mL | INTRAMUSCULAR | Status: DC | PRN

## 2012-02-03 MED ORDER — BUPROPION HCL ER (XL) 300 MG PO TB24
300.0000 mg | ORAL_TABLET | Freq: Every day | ORAL | Status: DC
  Administered 2012-02-03 – 2012-02-06 (×4): 300 mg via ORAL
  Filled 2012-02-03 (×4): qty 1

## 2012-02-03 MED ORDER — CYCLOBENZAPRINE HCL 10 MG PO TABS
10.0000 mg | ORAL_TABLET | Freq: Three times a day (TID) | ORAL | Status: DC
  Administered 2012-02-03 – 2012-02-04 (×2): 10 mg via ORAL
  Filled 2012-02-03 (×4): qty 1

## 2012-02-03 MED ORDER — MORPHINE SULFATE (PF) 1 MG/ML IV SOLN
INTRAVENOUS | Status: AC
  Administered 2012-02-03: 16:00:00
  Filled 2012-02-03: qty 25

## 2012-02-03 MED ORDER — PANTOPRAZOLE SODIUM 40 MG IV SOLR
40.0000 mg | Freq: Every day | INTRAVENOUS | Status: DC
  Filled 2012-02-03 (×2): qty 40

## 2012-02-03 MED ORDER — ENOXAPARIN SODIUM 40 MG/0.4ML ~~LOC~~ SOLN
40.0000 mg | SUBCUTANEOUS | Status: DC
  Administered 2012-02-03 – 2012-02-06 (×4): 40 mg via SUBCUTANEOUS
  Filled 2012-02-03 (×4): qty 0.4
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
## 2012-02-03 NOTE — ED Notes (Signed)
Pt daughter's phone number Zenia Resides [phone] for updates.

## 2012-02-03 NOTE — Evaluation (Signed)
Physical Therapy Evaluation Patient Details Name: Debra Brooks MRN: [id number] DOB: 1924-12-25 Today's Date: 02/03/2012  Problem List:  Patient Active Problem List  Diagnoses  . DEGENERATIVE JOINT DISEASE, KNEES, BILATERAL  . HIP PAIN, LEFT  . KNEE PAIN, RIGHT  . SACROILIITIS, LEFT  . SCIATICA, LEFT  . GREATER TROCHANTERIC BURSITIS  . PES PLANUS  . UNSTEADY GAIT  . CAD (coronary artery disease)  . S/P ablation of atrial flutter  . Hypertension  . Hyperlipemia  . Ovarian mass  . Edema  . Carotid bruit  . Sternal fracture  . Multiple rib fractures    Past Medical History:  Past Medical History  Diagnosis Date  . Hyperlipidemia   . Hypertension   . IHD (ischemic heart disease)     Cath in 2010 showed 60% ostial RCA lesion. She is managed medically  . Fatigue   . Palpitations   . Joint pain   . Anxiety and depression   . Breast cancer   . History of colon cancer   . Tricuspid regurgitation     ef 55-60%  . Atrial flutter     s/p ablation in 2007; She is intolerant to amiodarone  . Chronic edema    Past Surgical History:  Past Surgical History  Procedure Date  . Cardiac catheterization 2010    60% ostial RCA lesion. Managed medically  . Tonsillectomy   . Appendectomy   . Colectomy   . Breast surgery   . Bladder tacking     PT Assessment/Plan/Recommendation PT Assessment Clinical Impression Statement: Pt presents with a medical diagnosis of sternal and multiple rib fractures. Pt limited by pain with all movements, pain eased with pillow and pressure. Pt will benefit from skilled PT in the acute care setting in order to maximize functional mobility for a safe d/c home. PT Recommendation/Assessment: Patient will need skilled PT in the acute care venue PT Problem List: Decreased activity tolerance;Decreased mobility;Decreased knowledge of use of DME;Decreased safety awareness;Decreased knowledge of precautions;Pain PT Therapy Diagnosis : Difficulty walking;Acute  pain PT Plan PT Frequency: Min 5X/week PT Treatment/Interventions: DME instruction;Gait training;Functional mobility training;Therapeutic activities;Therapeutic exercise;Patient/family education PT Recommendation Follow Up Recommendations: Home health PT;Supervision - Intermittent Equipment Recommended: None recommended by PT PT Goals  Acute Rehab PT Goals PT Goal Formulation: With patient Time For Goal Achievement: 2 weeks Pt will go Supine/Side to Sit: with modified independence PT Goal: Supine/Side to Sit - Progress: Goal set today Pt will go Sit to Supine/Side: with modified independence PT Goal: Sit to Supine/Side - Progress: Goal set today Pt will go Sit to Stand: with modified independence PT Goal: Sit to Stand - Progress: Goal set today Pt will go Stand to Sit: with modified independence PT Goal: Stand to Sit - Progress: Goal set today Pt will Transfer Bed to Chair/Chair to Bed: with supervision PT Transfer Goal: Bed to Chair/Chair to Bed - Progress: Goal set today Pt will Ambulate: 51 - 150 feet;with supervision;with least restrictive assistive device PT Goal: Ambulate - Progress: Goal set today Pt will Perform Home Exercise Program: Independently PT Goal: Perform Home Exercise Program - Progress: Goal set today  PT Evaluation Precautions/Restrictions  Precautions Precautions: Sternal;Fall Required Braces or Orthoses: No Prior Functioning  Home Living Lives With: Alone Receives Help From: Other (Comment) ([facility]) Type of Home: Apartment Home Layout: Other (Comment) (2nd) Home Access: Elevator Bathroom Shower/Tub: Door;Walk-in Stage manager: Handicapped height Bathroom Accessibility: Yes How Accessible: Accessible via walker Home Adaptive Equipment: Walker - rolling;Straight cane;Grab  bars around toilet;Grab bars in shower;Shower chair with back;Reacher Prior Function Level of Independence: Independent with basic ADLs;Independent with gait;Independent  with transfers (CNA for safety 2hrs/night) Able to Take Stairs?: Yes Driving: Yes Vocation: Retired Leisure: Hobbies-yes (Comment) Comments: yoga, scrabble Cognition Cognition Arousal/Alertness: Awake/alert Overall Cognitive Status: Appears within functional limits for tasks assessed Orientation Level: Oriented X4 Sensation/Coordination Sensation Light Touch: Appears Intact Coordination Gross Motor Movements are Fluid and Coordinated: Yes Extremity Assessment RLE Assessment RLE Assessment: Within Functional Limits LLE Assessment LLE Assessment: Within Functional Limits Mobility (including Balance) Bed Mobility Bed Mobility: Yes Rolling Right: 4: Min assist Rolling Right Details (indicate cue type and reason): VC for sequencing to maintain sternal precuations. Assist through shoulders Right Sidelying to Sit: 4: Min assist;HOB elevated (comment degrees) Right Sidelying to Sit Details (indicate cue type and reason): VC for sequencing. Min assist for stability secondary to pt hesitant to bear any weight through arms  Sitting - Scoot to Edge of Bed: 5: Supervision Sitting - Scoot to Edge of Bed Details (indicate cue type and reason): VC for weight shifting Transfers Transfers: Yes Sit to Stand: 4: Min assist;From bed Sit to Stand Details (indicate cue type and reason): Min assist for support as pt will not push hands from bed to stand. Stand to Sit: 4: Min assist;With upper extremity assist;To chair/3-in-1 Stand to Sit Details: VC for sequencing and hand placement. Pt able to control descent with min assist for comfort Stand Pivot Transfers: 3: Mod assist Stand Pivot Transfer Details (indicate cue type and reason): Mod assist for stability. Pt taking very small steps and is hesitant throughout transfer. VC for sequencing and hand placement throughout Ambulation/Gait Ambulation/Gait: No    Exercise    End of Session PT - End of Session Equipment Utilized During Treatment: Gait  belt Activity Tolerance: Patient limited by pain Patient left: in chair;with call bell in reach Nurse Communication: Mobility status for transfers General Behavior During Session: [facility] for tasks performed Cognition: [facility] for tasks performed  Milana Kidney 02/03/2012, 11:57 AM  02/03/2012 Milana Kidney DPT PAGER: [phone] OFFICE: [phone]
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
## 2012-02-03 NOTE — ED Notes (Signed)
Dr Donell Beers in to see patient.

## 2012-02-04 ENCOUNTER — Inpatient Hospital Stay (HOSPITAL_COMMUNITY): Payer: Medicare Other

## 2012-02-04 LAB — CBC
HCT: 34.3 % — ABNORMAL LOW (ref 36.0–46.0)
Hemoglobin: 11 g/dL — ABNORMAL LOW (ref 12.0–15.0)
MCH: 31 pg (ref 26.0–34.0)
MCHC: 32.1 g/dL (ref 30.0–36.0)
MCV: 96.6 fL (ref 78.0–100.0)
Platelets: 207 K/uL (ref 150–400)
RBC: 3.55 MIL/uL — ABNORMAL LOW (ref 3.87–5.11)
RDW: 14.4 % (ref 11.5–15.5)
WBC: 16.2 K/uL — ABNORMAL HIGH (ref 4.0–10.5)

## 2012-02-04 LAB — BASIC METABOLIC PANEL WITH GFR
BUN: 22 mg/dL (ref 6–23)
CO2: 27 meq/L (ref 19–32)
Calcium: 8.6 mg/dL (ref 8.4–10.5)
Chloride: 105 meq/L (ref 96–112)
Creatinine, Ser: 0.93 mg/dL (ref 0.50–1.10)
GFR calc Af Amer: 64 mL/min — ABNORMAL LOW
GFR calc non Af Amer: 55 mL/min — ABNORMAL LOW
Glucose, Bld: 109 mg/dL — ABNORMAL HIGH (ref 70–99)
Potassium: 4 meq/L (ref 3.5–5.1)
Sodium: 139 meq/L (ref 135–145)

## 2012-02-04 LAB — PRO B NATRIURETIC PEPTIDE: Pro B Natriuretic peptide (BNP): 2907 pg/mL — ABNORMAL HIGH (ref 0–450)

## 2012-02-04 MED ORDER — POLYETHYLENE GLYCOL 3350 17 G PO PACK
17.0000 g | PACK | Freq: Every day | ORAL | Status: DC
  Administered 2012-02-04 – 2012-02-06 (×3): 17 g via ORAL
  Filled 2012-02-04 (×3): qty 1

## 2012-02-04 MED ORDER — TRAMADOL HCL 50 MG PO TABS
100.0000 mg | ORAL_TABLET | Freq: Four times a day (QID) | ORAL | Status: DC
  Administered 2012-02-04 – 2012-02-06 (×8): 100 mg via ORAL
  Filled 2012-02-04 (×13): qty 2

## 2012-02-04 MED ORDER — METHOCARBAMOL 500 MG PO TABS
500.0000 mg | ORAL_TABLET | Freq: Four times a day (QID) | ORAL | Status: DC | PRN
  Filled 2012-02-04: qty 2

## 2012-02-04 MED ORDER — LOSARTAN POTASSIUM 50 MG PO TABS
50.0000 mg | ORAL_TABLET | Freq: Every day | ORAL | Status: DC
  Administered 2012-02-05 – 2012-02-06 (×2): 50 mg via ORAL
  Filled 2012-02-04 (×2): qty 1

## 2012-02-04 MED ORDER — MORPHINE SULFATE 2 MG/ML IJ SOLN
2.0000 mg | INTRAMUSCULAR | Status: DC | PRN
  Administered 2012-02-05: 2 mg via INTRAVENOUS
  Filled 2012-02-04: qty 1

## 2012-02-04 MED ORDER — HYDROCODONE-ACETAMINOPHEN 10-325 MG PO TABS
1.0000 | ORAL_TABLET | ORAL | Status: DC | PRN
  Administered 2012-02-04 (×3): 1 via ORAL
  Filled 2012-02-04: qty 2
  Filled 2012-02-04: qty 1

## 2012-02-04 NOTE — Progress Notes (Signed)
Agree with above. Stepdown status. Significantly improved over last 30 hours.
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
## 2012-02-04 NOTE — Clinical Documentation Improvement (Signed)
CHF DOCUMENTATION CLARIFICATION QUERY  THIS DOCUMENT IS NOT A PERMANENT PART OF THE MEDICAL RECORD  TO RESPOND TO THE THIS QUERY, FOLLOW THE INSTRUCTIONS BELOW:  1. If needed, update documentation for the patient's encounter via the notes activity.  2. Access this query again and click edit on the In [employer].  3. After updating, or not, click F2 to complete all highlighted (required) fields concerning your review. Select "additional documentation in the medical record" OR "no additional documentation provided".  4. Click Sign note button.  5. The deficiency will fall out of your In Basket *Please let us know if you are not able to complete this workflow by phone or e-mail (listed below).  Please update your documentation within the medical record to reflect your response to this query.                                                                                    02/04/12  Dear Dr.Byerly / Associates,  In a better effort to capture your patient's severity of illness, reflect appropriate length of stay and utilization of resources, a review of the patient medical record has revealed the following indicators the diagnosis of Heart Failure.   Based on your clinical judgment, please clarify and document in a progress note and/or discharge summary the clinical condition associated with the following supporting information: In responding to this query please exercise your independent judgment.  The fact that a query is asked, does not imply that any particular answer is desired or expected.   "CHF - on Carvedilol and lasix" was documented in the H&P. Please consider providing greater specificity for this patient if known, document in the progress note(s) and discharge summary. THANK YOU!  [facility]: The acuity and type of CHF should be documented when known [acute, chronic, acute on chronic, systolic, diastolic, systolic and diastolic].  Possible Clinical Conditions?  - Chronic  Systolic Congestive Heart Failure  - Chronic Diastolic Congestive Heart Failure  - Other condition (please document in the progress notes and/or discharge summary)  - Cannot Clinically determine at this time    Supporting Information:  - "Diastolic CHF in the setting of atrial flutter. Echo with EF 55-60%, moderate TR" office visit note Dr.McClean 10/01/11    No additional documentation in chart upon review. SM    Thank You,  Saul Fordyce  Clinical Documentation Specialist: [phone] Pager  Health Information Management [place]

## 2012-02-04 NOTE — Progress Notes (Signed)
UR completed 

## 2012-02-05 ENCOUNTER — Inpatient Hospital Stay (HOSPITAL_COMMUNITY): Payer: Medicare Other

## 2012-02-05 DIAGNOSIS — I369 Nonrheumatic tricuspid valve disorder, unspecified: Secondary | ICD-10-CM

## 2012-02-05 LAB — CBC
MCHC: 31.6 g/dL (ref 30.0–36.0)
MCV: 99 fL (ref 78.0–100.0)
Platelets: 191 10*3/uL (ref 150–400)
RDW: 14.5 % (ref 11.5–15.5)
WBC: 12.7 10*3/uL — ABNORMAL HIGH (ref 4.0–10.5)

## 2012-02-05 LAB — BASIC METABOLIC PANEL
BUN: 21 mg/dL (ref 6–23)
Chloride: 102 mEq/L (ref 96–112)
Creatinine, Ser: 0.88 mg/dL (ref 0.50–1.10)
GFR calc Af Amer: 68 mL/min — ABNORMAL LOW (ref >90)
GFR calc non Af Amer: 59 mL/min — ABNORMAL LOW (ref >90)
Potassium: 4.1 mEq/L (ref 3.5–5.1)

## 2012-02-05 MED ORDER — HYDROCODONE-ACETAMINOPHEN 5-325 MG PO TABS
0.5000 | ORAL_TABLET | ORAL | Status: DC | PRN
  Administered 2012-02-05: 1 via ORAL
  Filled 2012-02-05: qty 1

## 2012-02-05 NOTE — Progress Notes (Signed)
Pt TX to [id number], VSS, called report.

## 2012-02-05 NOTE — Progress Notes (Signed)
Patient seen and examined.  Agree with PA's note.   Issues discussed with family.

## 2012-02-05 NOTE — Progress Notes (Signed)
OT Cancellation Note  Treatment cancelled today due to patient's refusal to participate.  Pt stated she is exhausted. Daughter present. Explained role of OT to pt and daughter.    Alba Cory 02/05/2012, 2:05 PM

## 2012-02-05 NOTE — Progress Notes (Signed)
Physical Therapy Treatment Patient Details Name: Debra Brooks MRN: [id number] DOB: 17-Jul-1925 Today's Date: 02/05/2012  PT Assessment/Plan  PT - Assessment/Plan Comments on Treatment Session: Pt tolerated ambulating 40 feet today.  During ambulation patent demonstrated posterior lean and several incidences of lost balance.  Pt using pillow to brace left side, hugging pillow with both hands and unwilling to use either UE to assist with transfers. PT Plan: Discharge plan remains appropriate;Frequency remains appropriate PT Frequency: Min 5X/week Follow Up Recommendations: Skilled nursing facility (at [facility]) Equipment Recommended: Defer to next venue PT Goals  Acute Rehab PT Goals PT Goal Formulation: With patient Time For Goal Achievement: 2 weeks Pt will go Sit to Stand: with modified independence PT Goal: Sit to Stand - Progress: Progressing toward goal Pt will go Stand to Sit: with modified independence PT Goal: Stand to Sit - Progress: Progressing toward goal Pt will Ambulate: 51 - 150 feet;with supervision;with least restrictive assistive device PT Goal: Ambulate - Progress: Progressing toward goal  PT Treatment Precautions/Restrictions  Precautions Precautions: Sternal;Fall Required Braces or Orthoses: No Restrictions Weight Bearing Restrictions: No Mobility (including Balance) Bed Mobility Bed Mobility: No Transfers Transfers: Yes Sit to Stand: 3: Mod assist;From chair/3-in-1 Sit to Stand Details (indicate cue type and reason): Mod assist as patient will not push with arms to aid with transfer.  Pt demonstrates posterior lean upon standing. Stand to Sit: 1: +2 Total assist;Patient percentage (comment);To chair/3-in-1 (Pt = 50%) Stand to Sit Details: Pt required assist to control descent as she refuses to use her arms to assist with descent control. Ambulation/Gait Ambulation/Gait: Yes Ambulation/Gait Assistance: 1: +2 Total assist;Patient percentage (comment) (pt =  50%) Ambulation/Gait Assistance Details (indicate cue type and reason): Pt ambulated demonstrating posterior lean and several incidences of lost balance. Ambulation Distance (Feet): 40 Feet Assistive device: 1 person hand held assist Gait Pattern: Step-to pattern;Decreased stride length;Shuffle Gait velocity: Decreased Stairs: No Wheelchair Mobility Wheelchair Mobility: No  Posture/Postural Control Posture/Postural Control: No significant limitations Balance Balance Assessed: Yes Static Standing Balance Static Standing - Balance Support: No upper extremity supported Static Standing - Level of Assistance: 4: Min assist Static Standing - Comment/# of Minutes: Pt demonstrates posterior lean with static standing. Exercise    End of Session PT - End of Session Activity Tolerance: Patient limited by pain Patient left: in chair;with call bell in reach;with family/visitor present General Behavior During Session: Agitated Cognition: Impaired  Ezzard Standing SPT 02/05/2012, 11:55 AM
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
## 2012-02-05 NOTE — Progress Notes (Signed)
Pt having unifocal and bigemny PVCs on the monitor, Dr. Corliss Skains notified, no new orders given, pt asymptomatic, no distress voiced or noted
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
## 2012-02-05 NOTE — Progress Notes (Signed)
*  PRELIMINARY RESULTS* Echocardiogram 2D Echocardiogram has been performed.  Glean Salen [facility] 02/05/2012, 4:04 PM
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
## 2012-02-05 NOTE — Progress Notes (Signed)
Agree with student PT treatment note. Rinaldo Macqueen, PT DPT [phone]  

## 2012-02-06 DIAGNOSIS — K5900 Constipation, unspecified: Secondary | ICD-10-CM | POA: Diagnosis not present

## 2012-02-06 DIAGNOSIS — R5381 Other malaise: Secondary | ICD-10-CM | POA: Diagnosis not present

## 2012-02-06 DIAGNOSIS — R0902 Hypoxemia: Secondary | ICD-10-CM | POA: Diagnosis not present

## 2012-02-06 DIAGNOSIS — I509 Heart failure, unspecified: Secondary | ICD-10-CM | POA: Diagnosis not present

## 2012-02-06 DIAGNOSIS — S2249XA Multiple fractures of ribs, unspecified side, initial encounter for closed fracture: Secondary | ICD-10-CM | POA: Diagnosis not present

## 2012-02-06 DIAGNOSIS — S2220XA Unspecified fracture of sternum, initial encounter for closed fracture: Secondary | ICD-10-CM | POA: Diagnosis not present

## 2012-02-06 MED ORDER — HYDROCODONE-ACETAMINOPHEN 10-650 MG PO TABS: 0.5000 | ORAL_TABLET | ORAL | Status: DC | PRN

## 2012-02-06 MED ORDER — POTASSIUM CHLORIDE 20 MEQ/15ML (10%) PO LIQD
10.0000 meq | Freq: Every day | ORAL | Status: DC
  Filled 2012-02-06: qty 7.5

## 2012-02-06 MED ORDER — POTASSIUM CHLORIDE CRYS ER 10 MEQ PO TBCR
10.0000 meq | EXTENDED_RELEASE_TABLET | Freq: Every day | ORAL | Status: DC
  Administered 2012-02-06: 10 meq via ORAL
  Filled 2012-02-06: qty 1

## 2012-02-06 MED ORDER — TRAMADOL HCL 50 MG PO TABS: 100.0000 mg | ORAL_TABLET | Freq: Four times a day (QID) | ORAL | Status: DC

## 2012-02-06 NOTE — Discharge Summary (Signed)
Okay to go home.  This patient has been seen and I agree with the findings and treatment plan.  Tyshia Fenter O. Shivam Mestas, III, MD, FACS [phone] (pager) [phone] (direct pager) Trauma Surgeon 

## 2012-02-06 NOTE — Progress Notes (Signed)
OT Cancellation Note  Treatment cancelled today due to pt discharging to [facility] SNF.  Will defer OT eval to next venue. Thanks!   02/06/2012 Cipriano Mile OTR/L Pager [phone] Office [phone]
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
## 2012-02-06 NOTE — Progress Notes (Signed)
DC IV, DC Tele, DC to SNF. Discharge instructions and paper worked handled by Child psychotherapist. Patient's family denied any questions or concerns at this time. Patient leaving unit via wheelchair and appears in no acute distress.

## 2012-02-06 NOTE — Progress Notes (Signed)
Okay to go home.  This patient has been seen and I agree with the findings and treatment plan.  Shelah Heatley O. Araf Clugston, III, MD, FACS [phone] (pager) [phone] (direct pager) Trauma Surgeon 

## 2012-02-06 NOTE — Progress Notes (Signed)
Physical Therapy Treatment Patient Details Name: Debra Brooks MRN: [id number] DOB: [birth year] Today's Date: 02/06/2012  PT Assessment/Plan  PT - Assessment/Plan Comments on Treatment Session: Pt continues to improve with therapy.  PT Plan: Discharge plan remains appropriate;Frequency remains appropriate PT Frequency: Min 5X/week Follow Up Recommendations: Skilled nursing facility Equipment Recommended: Defer to next venue PT Goals  Acute Rehab PT Goals PT Goal Formulation: With patient Time For Goal Achievement: 2 weeks Pt will go Supine/Side to Sit: with modified independence PT Goal: Supine/Side to Sit - Progress: Progressing toward goal Pt will go Sit to Supine/Side: with modified independence PT Goal: Sit to Supine/Side - Progress: Not met Pt will go Sit to Stand: with modified independence PT Goal: Sit to Stand - Progress: Progressing toward goal Pt will go Stand to Sit: with modified independence PT Goal: Stand to Sit - Progress: Progressing toward goal Pt will Transfer Bed to Chair/Chair to Bed: with supervision PT Transfer Goal: Bed to Chair/Chair to Bed - Progress: Not met Pt will Ambulate: 51 - 150 feet;with supervision;with least restrictive assistive device PT Goal: Ambulate - Progress: Progressing toward goal Pt will Perform Home Exercise Program: Independently PT Goal: Perform Home Exercise Program - Progress: Not met  PT Treatment Precautions/Restrictions  Precautions Precautions: Sternal;Fall Required Braces or Orthoses: No Restrictions Weight Bearing Restrictions: No Mobility (including Balance) Bed Mobility Bed Mobility: Yes Rolling Right: 5: Supervision Rolling Right Details (indicate cue type and reason): cues for technique Rolling Left: Not tested (comment) Right Sidelying to Sit: 4: Min assist;HOB elevated (comment degrees) (pt 85%) Right Sidelying to Sit Details (indicate cue type and reason): cues for technique, min assist to raise shoulders due to  sternal pain.  Left Sidelying to Sit: Not tested (comment) Sitting - Scoot to Edge of Bed: 6: Modified independent (Device/Increase time) Sit to Supine: Not Tested (comment) Transfers Transfers: Yes Sit to Stand: 4: Min assist;From bed;With upper extremity assist Sit to Stand Details (indicate cue type and reason): Min assist to stabilize pt.  Pt able to push from bed with right UE.   Stand to Sit: 4: Min assist;To chair/3-in-1 Stand to Sit Details: Cues for hand placement and assist for controlled descent to chair.   Stand Pivot Transfers: Not tested (comment) Ambulation/Gait Ambulation/Gait: Yes Ambulation/Gait Assistance: 3: Mod assist;4: Min assist Ambulation/Gait Assistance Details (indicate cue type and reason): Assist to stabilize pt.  Initially attempted with RW but pt unable to use Left UE. Then attempted with cane, pt had difficulty advancing LEs due to fear of falling. HHA supporting pt under her right armpit was effective in allowing pt to take full steps without fear.  Pt also ambulated with 1 hand on rail in hallway and manual facilitation at pt's bilateral hips to promote stability.   Ambulation Distance (Feet): 40 Feet Assistive device: 1 person hand held assist Gait Pattern: Step-through pattern;Decreased stride length;Shuffle Gait velocity: Decreased Stairs: No Wheelchair Mobility Wheelchair Mobility: No  Posture/Postural Control Posture/Postural Control: No significant limitations Balance Balance Assessed: No Exercise    End of Session PT - End of Session Equipment Utilized During Treatment: Other (comment) (No gait belt due to rib fractures. ) Activity Tolerance: Patient tolerated treatment well Patient left: in chair;with call bell in reach;with family/visitor present Nurse Communication: Mobility status for transfers;Mobility status for ambulation General Behavior During Session: [facility] for tasks performed Cognition: [facility] for tasks  performed  Johnathan Tortorelli 02/06/2012, 4:03 PM Theoplis Garciagarcia L. Susannah Carbin DPT [phone]
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
## 2012-02-06 NOTE — Progress Notes (Signed)
Patient is having Bigeminy and is asymptomatic. No signs or symptoms of discomfort. MD notified. No new orders given. Will continue to monitor patient for any further changes in condition.
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
## 2012-02-06 NOTE — Progress Notes (Signed)
Clinical Social Worker following patient for discharge to [facility].  CSW contacted facilitated and arranged patient discharge plans.  CSW arranged patient discharge to [facility] this afternoon through [place] transportation.  CSW spoke with patient and patient daughter at bedside, who are agreeable with discharge plan and transportation.  Clinical Social Worker will sign off for now as social work intervention is no longer needed. Please consult [place] again if new need arises.  [address] [place], [place] [phone]

## 2012-02-07 ENCOUNTER — Telehealth: Payer: Self-pay | Admitting: Cardiology

## 2012-02-07 ENCOUNTER — Telehealth: Payer: Self-pay | Admitting: Oncology

## 2012-02-07 DIAGNOSIS — M2550 Pain in unspecified joint: Secondary | ICD-10-CM | POA: Diagnosis not present

## 2012-02-07 DIAGNOSIS — Z4789 Encounter for other orthopedic aftercare: Secondary | ICD-10-CM | POA: Diagnosis not present

## 2012-02-07 DIAGNOSIS — S2239XA Fracture of one rib, unspecified side, initial encounter for closed fracture: Secondary | ICD-10-CM | POA: Diagnosis not present

## 2012-02-07 DIAGNOSIS — S2249XA Multiple fractures of ribs, unspecified side, initial encounter for closed fracture: Secondary | ICD-10-CM | POA: Diagnosis not present

## 2012-02-07 DIAGNOSIS — M6281 Muscle weakness (generalized): Secondary | ICD-10-CM | POA: Diagnosis not present

## 2012-02-07 DIAGNOSIS — R269 Unspecified abnormalities of gait and mobility: Secondary | ICD-10-CM | POA: Diagnosis not present

## 2012-02-07 DIAGNOSIS — R279 Unspecified lack of coordination: Secondary | ICD-10-CM | POA: Diagnosis not present
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
## 2012-02-07 NOTE — Telephone Encounter (Signed)
Please call her back and see if she can come for appointment.

## 2012-02-08 ENCOUNTER — Ambulatory Visit: Payer: Medicare Other | Admitting: Oncology

## 2012-02-08 ENCOUNTER — Ambulatory Visit: Payer: Medicare Other | Admitting: Cardiology

## 2012-02-08 DIAGNOSIS — S2249XA Multiple fractures of ribs, unspecified side, initial encounter for closed fracture: Secondary | ICD-10-CM | POA: Diagnosis not present

## 2012-02-08 DIAGNOSIS — M6281 Muscle weakness (generalized): Secondary | ICD-10-CM | POA: Diagnosis not present

## 2012-02-08 DIAGNOSIS — Z4789 Encounter for other orthopedic aftercare: Secondary | ICD-10-CM | POA: Diagnosis not present

## 2012-02-08 DIAGNOSIS — R269 Unspecified abnormalities of gait and mobility: Secondary | ICD-10-CM | POA: Diagnosis not present

## 2012-02-08 DIAGNOSIS — S2239XA Fracture of one rib, unspecified side, initial encounter for closed fracture: Secondary | ICD-10-CM | POA: Diagnosis not present

## 2012-02-08 DIAGNOSIS — R279 Unspecified lack of coordination: Secondary | ICD-10-CM | POA: Diagnosis not present

## 2012-02-08 NOTE — Telephone Encounter (Signed)
Caryn Bee talked with pt.

## 2012-02-08 NOTE — Telephone Encounter (Signed)
Caryn Bee spoke with pt. Pt given appt with Dr Shirlee Latch 02/12/12

## 2012-02-11 DIAGNOSIS — M6281 Muscle weakness (generalized): Secondary | ICD-10-CM | POA: Diagnosis not present

## 2012-02-11 DIAGNOSIS — R269 Unspecified abnormalities of gait and mobility: Secondary | ICD-10-CM | POA: Diagnosis not present

## 2012-02-11 DIAGNOSIS — S2249XA Multiple fractures of ribs, unspecified side, initial encounter for closed fracture: Secondary | ICD-10-CM | POA: Diagnosis not present

## 2012-02-11 DIAGNOSIS — S2239XA Fracture of one rib, unspecified side, initial encounter for closed fracture: Secondary | ICD-10-CM | POA: Diagnosis not present

## 2012-02-11 DIAGNOSIS — Z4789 Encounter for other orthopedic aftercare: Secondary | ICD-10-CM | POA: Diagnosis not present

## 2012-02-11 DIAGNOSIS — R279 Unspecified lack of coordination: Secondary | ICD-10-CM | POA: Diagnosis not present

## 2012-02-12 ENCOUNTER — Ambulatory Visit (INDEPENDENT_AMBULATORY_CARE_PROVIDER_SITE_OTHER): Payer: Medicare Other | Admitting: Cardiology

## 2012-02-12 ENCOUNTER — Encounter: Payer: Self-pay | Admitting: Cardiology

## 2012-02-12 VITALS — BP 120/70 | HR 60 | Ht 66.0 in | Wt 141.8 lb

## 2012-02-12 DIAGNOSIS — R269 Unspecified abnormalities of gait and mobility: Secondary | ICD-10-CM | POA: Diagnosis not present

## 2012-02-12 DIAGNOSIS — R279 Unspecified lack of coordination: Secondary | ICD-10-CM | POA: Diagnosis not present

## 2012-02-12 DIAGNOSIS — R0989 Other specified symptoms and signs involving the circulatory and respiratory systems: Secondary | ICD-10-CM

## 2012-02-12 DIAGNOSIS — I2510 Atherosclerotic heart disease of native coronary artery without angina pectoris: Secondary | ICD-10-CM | POA: Diagnosis not present

## 2012-02-12 DIAGNOSIS — S2239XA Fracture of one rib, unspecified side, initial encounter for closed fracture: Secondary | ICD-10-CM | POA: Diagnosis not present

## 2012-02-12 DIAGNOSIS — E785 Hyperlipidemia, unspecified: Secondary | ICD-10-CM | POA: Diagnosis not present

## 2012-02-12 DIAGNOSIS — Z4789 Encounter for other orthopedic aftercare: Secondary | ICD-10-CM | POA: Diagnosis not present

## 2012-02-12 DIAGNOSIS — S2249XA Multiple fractures of ribs, unspecified side, initial encounter for closed fracture: Secondary | ICD-10-CM | POA: Diagnosis not present

## 2012-02-12 DIAGNOSIS — I509 Heart failure, unspecified: Secondary | ICD-10-CM

## 2012-02-12 DIAGNOSIS — M6281 Muscle weakness (generalized): Secondary | ICD-10-CM | POA: Diagnosis not present
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
## 2012-02-12 NOTE — Assessment & Plan Note (Signed)
LDL at goal when checked in 11/12 (< 70 given CAD).  

## 2012-02-12 NOTE — Patient Instructions (Signed)
Your physician recommends that you have  lab work in: 1 week. You have the order. Please fax the results to Dr Shirlee Latch [phone].  Your physician recommends that you schedule a follow-up appointment in: 1 month with Dr Shirlee Latch.

## 2012-02-12 NOTE — Assessment & Plan Note (Signed)
Only minimal disease on recent carotid dopplers.  

## 2012-02-12 NOTE — Assessment & Plan Note (Addendum)
Chronic primarily diastolic CHF with EF 45-50%.  She diuresed well recently in the hospital with 14 lb weight loss since I last her last.  Continue current po Lasix dosing.  I will check  BMET and BNP.  When I last saw her, I told her she could stop the digoxin but she kept taking it.  With mildly decreased EF, will continue digoxin for now.  She can stop oxygen at rest as saturation was 96%.  She will need to have her ambulatory oxygen saturation checked at some point at [facility].
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
## 2012-02-13 DIAGNOSIS — R279 Unspecified lack of coordination: Secondary | ICD-10-CM | POA: Diagnosis not present

## 2012-02-13 DIAGNOSIS — S2249XA Multiple fractures of ribs, unspecified side, initial encounter for closed fracture: Secondary | ICD-10-CM | POA: Diagnosis not present

## 2012-02-13 DIAGNOSIS — S2239XA Fracture of one rib, unspecified side, initial encounter for closed fracture: Secondary | ICD-10-CM | POA: Diagnosis not present

## 2012-02-13 DIAGNOSIS — R269 Unspecified abnormalities of gait and mobility: Secondary | ICD-10-CM | POA: Diagnosis not present

## 2012-02-13 DIAGNOSIS — M6281 Muscle weakness (generalized): Secondary | ICD-10-CM | POA: Diagnosis not present

## 2012-02-13 DIAGNOSIS — Z4789 Encounter for other orthopedic aftercare: Secondary | ICD-10-CM | POA: Diagnosis not present

## 2012-02-14 DIAGNOSIS — Z4789 Encounter for other orthopedic aftercare: Secondary | ICD-10-CM | POA: Diagnosis not present

## 2012-02-14 DIAGNOSIS — M6281 Muscle weakness (generalized): Secondary | ICD-10-CM | POA: Diagnosis not present

## 2012-02-14 DIAGNOSIS — R269 Unspecified abnormalities of gait and mobility: Secondary | ICD-10-CM | POA: Diagnosis not present

## 2012-02-14 DIAGNOSIS — S2249XA Multiple fractures of ribs, unspecified side, initial encounter for closed fracture: Secondary | ICD-10-CM | POA: Diagnosis not present

## 2012-02-14 DIAGNOSIS — S2239XA Fracture of one rib, unspecified side, initial encounter for closed fracture: Secondary | ICD-10-CM | POA: Diagnosis not present

## 2012-02-14 DIAGNOSIS — R279 Unspecified lack of coordination: Secondary | ICD-10-CM | POA: Diagnosis not present

## 2012-02-15 DIAGNOSIS — Z4789 Encounter for other orthopedic aftercare: Secondary | ICD-10-CM | POA: Diagnosis not present

## 2012-02-15 DIAGNOSIS — S2220XA Unspecified fracture of sternum, initial encounter for closed fracture: Secondary | ICD-10-CM | POA: Diagnosis not present

## 2012-02-15 DIAGNOSIS — M6281 Muscle weakness (generalized): Secondary | ICD-10-CM | POA: Diagnosis not present

## 2012-02-15 DIAGNOSIS — R5381 Other malaise: Secondary | ICD-10-CM | POA: Diagnosis not present

## 2012-02-15 DIAGNOSIS — R269 Unspecified abnormalities of gait and mobility: Secondary | ICD-10-CM | POA: Diagnosis not present

## 2012-02-15 DIAGNOSIS — I509 Heart failure, unspecified: Secondary | ICD-10-CM | POA: Diagnosis not present

## 2012-02-15 DIAGNOSIS — R279 Unspecified lack of coordination: Secondary | ICD-10-CM | POA: Diagnosis not present

## 2012-02-15 DIAGNOSIS — S2249XA Multiple fractures of ribs, unspecified side, initial encounter for closed fracture: Secondary | ICD-10-CM | POA: Diagnosis not present

## 2012-02-15 DIAGNOSIS — R0902 Hypoxemia: Secondary | ICD-10-CM | POA: Diagnosis not present

## 2012-02-15 DIAGNOSIS — S2239XA Fracture of one rib, unspecified side, initial encounter for closed fracture: Secondary | ICD-10-CM | POA: Diagnosis not present

## 2012-02-18 DIAGNOSIS — R279 Unspecified lack of coordination: Secondary | ICD-10-CM | POA: Diagnosis not present

## 2012-02-18 DIAGNOSIS — M6281 Muscle weakness (generalized): Secondary | ICD-10-CM | POA: Diagnosis not present

## 2012-02-18 DIAGNOSIS — R269 Unspecified abnormalities of gait and mobility: Secondary | ICD-10-CM | POA: Diagnosis not present

## 2012-02-18 DIAGNOSIS — S2249XA Multiple fractures of ribs, unspecified side, initial encounter for closed fracture: Secondary | ICD-10-CM | POA: Diagnosis not present

## 2012-02-18 DIAGNOSIS — Z4789 Encounter for other orthopedic aftercare: Secondary | ICD-10-CM | POA: Diagnosis not present

## 2012-02-18 DIAGNOSIS — S2239XA Fracture of one rib, unspecified side, initial encounter for closed fracture: Secondary | ICD-10-CM | POA: Diagnosis not present

## 2012-02-19 ENCOUNTER — Encounter: Payer: Self-pay | Admitting: Cardiology

## 2012-02-19 DIAGNOSIS — S2239XA Fracture of one rib, unspecified side, initial encounter for closed fracture: Secondary | ICD-10-CM | POA: Diagnosis not present

## 2012-02-19 DIAGNOSIS — I2510 Atherosclerotic heart disease of native coronary artery without angina pectoris: Secondary | ICD-10-CM | POA: Diagnosis not present

## 2012-02-19 DIAGNOSIS — E785 Hyperlipidemia, unspecified: Secondary | ICD-10-CM | POA: Diagnosis not present

## 2012-02-19 DIAGNOSIS — R279 Unspecified lack of coordination: Secondary | ICD-10-CM | POA: Diagnosis not present

## 2012-02-19 DIAGNOSIS — H3530 Unspecified macular degeneration: Secondary | ICD-10-CM | POA: Insufficient documentation

## 2012-02-19 DIAGNOSIS — Z4789 Encounter for other orthopedic aftercare: Secondary | ICD-10-CM | POA: Diagnosis not present

## 2012-02-19 DIAGNOSIS — H353190 Nonexudative age-related macular degeneration, unspecified eye, stage unspecified: Secondary | ICD-10-CM | POA: Diagnosis not present

## 2012-02-19 DIAGNOSIS — I509 Heart failure, unspecified: Secondary | ICD-10-CM | POA: Diagnosis not present

## 2012-02-19 DIAGNOSIS — S2249XA Multiple fractures of ribs, unspecified side, initial encounter for closed fracture: Secondary | ICD-10-CM | POA: Diagnosis not present

## 2012-02-19 DIAGNOSIS — R269 Unspecified abnormalities of gait and mobility: Secondary | ICD-10-CM | POA: Diagnosis not present

## 2012-02-19 DIAGNOSIS — M6281 Muscle weakness (generalized): Secondary | ICD-10-CM | POA: Diagnosis not present

## 2012-02-19 DIAGNOSIS — H35369 Drusen (degenerative) of macula, unspecified eye: Secondary | ICD-10-CM | POA: Insufficient documentation

## 2012-02-19 DIAGNOSIS — I10 Essential (primary) hypertension: Secondary | ICD-10-CM | POA: Diagnosis not present

## 2012-02-19 DIAGNOSIS — I209 Angina pectoris, unspecified: Secondary | ICD-10-CM | POA: Diagnosis not present

## 2012-02-20 DIAGNOSIS — S2249XA Multiple fractures of ribs, unspecified side, initial encounter for closed fracture: Secondary | ICD-10-CM | POA: Diagnosis not present

## 2012-02-20 DIAGNOSIS — S2239XA Fracture of one rib, unspecified side, initial encounter for closed fracture: Secondary | ICD-10-CM | POA: Diagnosis not present

## 2012-02-20 DIAGNOSIS — R269 Unspecified abnormalities of gait and mobility: Secondary | ICD-10-CM | POA: Diagnosis not present

## 2012-02-20 DIAGNOSIS — Z4789 Encounter for other orthopedic aftercare: Secondary | ICD-10-CM | POA: Diagnosis not present

## 2012-02-20 DIAGNOSIS — R279 Unspecified lack of coordination: Secondary | ICD-10-CM | POA: Diagnosis not present

## 2012-02-20 DIAGNOSIS — M6281 Muscle weakness (generalized): Secondary | ICD-10-CM | POA: Diagnosis not present

## 2012-02-21 DIAGNOSIS — S2249XA Multiple fractures of ribs, unspecified side, initial encounter for closed fracture: Secondary | ICD-10-CM | POA: Diagnosis not present

## 2012-02-21 DIAGNOSIS — M6281 Muscle weakness (generalized): Secondary | ICD-10-CM | POA: Diagnosis not present

## 2012-02-21 DIAGNOSIS — R269 Unspecified abnormalities of gait and mobility: Secondary | ICD-10-CM | POA: Diagnosis not present

## 2012-02-21 DIAGNOSIS — Z4789 Encounter for other orthopedic aftercare: Secondary | ICD-10-CM | POA: Diagnosis not present

## 2012-02-21 DIAGNOSIS — R279 Unspecified lack of coordination: Secondary | ICD-10-CM | POA: Diagnosis not present

## 2012-02-21 DIAGNOSIS — S2239XA Fracture of one rib, unspecified side, initial encounter for closed fracture: Secondary | ICD-10-CM | POA: Diagnosis not present

## 2012-02-22 DIAGNOSIS — R5381 Other malaise: Secondary | ICD-10-CM | POA: Diagnosis not present

## 2012-02-22 DIAGNOSIS — Z4789 Encounter for other orthopedic aftercare: Secondary | ICD-10-CM | POA: Diagnosis not present

## 2012-02-22 DIAGNOSIS — R269 Unspecified abnormalities of gait and mobility: Secondary | ICD-10-CM | POA: Diagnosis not present

## 2012-02-22 DIAGNOSIS — S2249XA Multiple fractures of ribs, unspecified side, initial encounter for closed fracture: Secondary | ICD-10-CM | POA: Diagnosis not present

## 2012-02-22 DIAGNOSIS — F329 Major depressive disorder, single episode, unspecified: Secondary | ICD-10-CM | POA: Diagnosis not present

## 2012-02-22 DIAGNOSIS — I509 Heart failure, unspecified: Secondary | ICD-10-CM | POA: Diagnosis not present

## 2012-02-22 DIAGNOSIS — S2239XA Fracture of one rib, unspecified side, initial encounter for closed fracture: Secondary | ICD-10-CM | POA: Diagnosis not present

## 2012-02-22 DIAGNOSIS — M6281 Muscle weakness (generalized): Secondary | ICD-10-CM | POA: Diagnosis not present

## 2012-02-22 DIAGNOSIS — S2220XA Unspecified fracture of sternum, initial encounter for closed fracture: Secondary | ICD-10-CM | POA: Diagnosis not present

## 2012-02-22 DIAGNOSIS — R279 Unspecified lack of coordination: Secondary | ICD-10-CM | POA: Diagnosis not present

## 2012-02-24 DIAGNOSIS — R279 Unspecified lack of coordination: Secondary | ICD-10-CM | POA: Diagnosis not present

## 2012-02-24 DIAGNOSIS — M6281 Muscle weakness (generalized): Secondary | ICD-10-CM | POA: Diagnosis not present

## 2012-02-24 DIAGNOSIS — R269 Unspecified abnormalities of gait and mobility: Secondary | ICD-10-CM | POA: Diagnosis not present

## 2012-02-24 DIAGNOSIS — Z4789 Encounter for other orthopedic aftercare: Secondary | ICD-10-CM | POA: Diagnosis not present

## 2012-02-24 DIAGNOSIS — S2249XA Multiple fractures of ribs, unspecified side, initial encounter for closed fracture: Secondary | ICD-10-CM | POA: Diagnosis not present

## 2012-02-24 DIAGNOSIS — S2239XA Fracture of one rib, unspecified side, initial encounter for closed fracture: Secondary | ICD-10-CM | POA: Diagnosis not present

## 2012-02-25 DIAGNOSIS — R279 Unspecified lack of coordination: Secondary | ICD-10-CM | POA: Diagnosis not present

## 2012-02-25 DIAGNOSIS — S2239XA Fracture of one rib, unspecified side, initial encounter for closed fracture: Secondary | ICD-10-CM | POA: Diagnosis not present

## 2012-02-25 DIAGNOSIS — R269 Unspecified abnormalities of gait and mobility: Secondary | ICD-10-CM | POA: Diagnosis not present

## 2012-02-25 DIAGNOSIS — S2249XA Multiple fractures of ribs, unspecified side, initial encounter for closed fracture: Secondary | ICD-10-CM | POA: Diagnosis not present

## 2012-02-25 DIAGNOSIS — M6281 Muscle weakness (generalized): Secondary | ICD-10-CM | POA: Diagnosis not present

## 2012-02-25 DIAGNOSIS — Z4789 Encounter for other orthopedic aftercare: Secondary | ICD-10-CM | POA: Diagnosis not present

## 2012-02-26 DIAGNOSIS — S2239XA Fracture of one rib, unspecified side, initial encounter for closed fracture: Secondary | ICD-10-CM | POA: Diagnosis not present

## 2012-02-26 DIAGNOSIS — R269 Unspecified abnormalities of gait and mobility: Secondary | ICD-10-CM | POA: Diagnosis not present

## 2012-02-26 DIAGNOSIS — Z4789 Encounter for other orthopedic aftercare: Secondary | ICD-10-CM | POA: Diagnosis not present

## 2012-02-26 DIAGNOSIS — M6281 Muscle weakness (generalized): Secondary | ICD-10-CM | POA: Diagnosis not present

## 2012-02-26 DIAGNOSIS — S2249XA Multiple fractures of ribs, unspecified side, initial encounter for closed fracture: Secondary | ICD-10-CM | POA: Diagnosis not present

## 2012-02-26 DIAGNOSIS — R279 Unspecified lack of coordination: Secondary | ICD-10-CM | POA: Diagnosis not present

## 2012-02-27 DIAGNOSIS — M6281 Muscle weakness (generalized): Secondary | ICD-10-CM | POA: Diagnosis not present

## 2012-02-27 DIAGNOSIS — M2550 Pain in unspecified joint: Secondary | ICD-10-CM | POA: Diagnosis not present

## 2012-02-27 DIAGNOSIS — S2239XA Fracture of one rib, unspecified side, initial encounter for closed fracture: Secondary | ICD-10-CM | POA: Diagnosis not present

## 2012-02-27 DIAGNOSIS — R279 Unspecified lack of coordination: Secondary | ICD-10-CM | POA: Diagnosis not present

## 2012-02-27 DIAGNOSIS — R269 Unspecified abnormalities of gait and mobility: Secondary | ICD-10-CM | POA: Diagnosis not present

## 2012-02-27 DIAGNOSIS — S2249XA Multiple fractures of ribs, unspecified side, initial encounter for closed fracture: Secondary | ICD-10-CM | POA: Diagnosis not present

## 2012-02-27 DIAGNOSIS — Z4789 Encounter for other orthopedic aftercare: Secondary | ICD-10-CM | POA: Diagnosis not present

## 2012-02-28 DIAGNOSIS — R0902 Hypoxemia: Secondary | ICD-10-CM | POA: Diagnosis not present

## 2012-02-28 DIAGNOSIS — S2220XA Unspecified fracture of sternum, initial encounter for closed fracture: Secondary | ICD-10-CM | POA: Diagnosis not present

## 2012-02-28 DIAGNOSIS — R5381 Other malaise: Secondary | ICD-10-CM | POA: Diagnosis not present

## 2012-02-28 DIAGNOSIS — I509 Heart failure, unspecified: Secondary | ICD-10-CM | POA: Diagnosis not present

## 2012-02-28 DIAGNOSIS — S2249XA Multiple fractures of ribs, unspecified side, initial encounter for closed fracture: Secondary | ICD-10-CM | POA: Diagnosis not present

## 2012-03-03 ENCOUNTER — Telehealth: Payer: Self-pay | Admitting: Cardiology

## 2012-03-03 NOTE — Telephone Encounter (Signed)
New msg Pt has appt on 0516 at 0845 and wanted to know if it could be changed to another day but not so early in morning.

## 2012-03-03 NOTE — Telephone Encounter (Signed)
Spoke with pt. Appt rescheduled to 03/17/12 11:10am with Scott.

## 2012-03-05 DIAGNOSIS — R109 Unspecified abdominal pain: Secondary | ICD-10-CM | POA: Diagnosis not present

## 2012-03-13 ENCOUNTER — Ambulatory Visit: Payer: Medicare Other | Admitting: Cardiology

## 2012-03-17 ENCOUNTER — Encounter: Payer: Self-pay | Admitting: Physician Assistant

## 2012-03-17 ENCOUNTER — Ambulatory Visit (INDEPENDENT_AMBULATORY_CARE_PROVIDER_SITE_OTHER): Payer: Medicare Other | Admitting: Physician Assistant

## 2012-03-17 ENCOUNTER — Telehealth: Payer: Self-pay | Admitting: Physician Assistant

## 2012-03-17 VITALS — BP 126/58 | HR 62 | Ht 66.0 in | Wt 148.0 lb

## 2012-03-17 DIAGNOSIS — I2510 Atherosclerotic heart disease of native coronary artery without angina pectoris: Secondary | ICD-10-CM

## 2012-03-17 DIAGNOSIS — R079 Chest pain, unspecified: Secondary | ICD-10-CM

## 2012-03-17 DIAGNOSIS — I509 Heart failure, unspecified: Secondary | ICD-10-CM | POA: Diagnosis not present

## 2012-03-17 MED ORDER — FAMOTIDINE 20 MG PO TABS: 20.0000 mg | ORAL_TABLET | Freq: Two times a day (BID) | ORAL | Status: DC

## 2012-03-17 NOTE — Telephone Encounter (Signed)
Patient called to report OTC med she was taking in calcium carbonate/tums.   Please return call to patient to Debra Brooks which medication she should continue with, she can be reached at [phone]
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
## 2012-03-17 NOTE — Patient Instructions (Signed)
PLEASE KEEP APPOINTMENT WITH DR. Shirlee Latch ON 04/22/12 @ 11:30 AM  START PEPCID 20 MG 1 TABLET TWICE DAILY  PLEASE CALL [phone] TO LET us KNOW WHAT OTC MEDICATION YOU HAVE BEEN TAKING FOR THE HEARTBURN, REFLUX

## 2012-03-17 NOTE — Telephone Encounter (Signed)
Hi Scott,  Pt called back with OTC med for heartburn was TUMS, see phone message.   Charlynn Grimes 03/17/2012 2:10 PM Signed  Patient called to report OTC med she was taking in calcium carbonate/tums. Please return call to patient to Francee Piccolo which medication she should continue with, she can be reached at [phone]

## 2012-03-17 NOTE — Telephone Encounter (Signed)
Ok She can continue to take this as needed. Tereso Newcomer, PA-C  5:06 PM 03/17/2012

## 2012-03-18 NOTE — Telephone Encounter (Signed)
S/w nursing assistant and told her that it was ok for the pt to still take the tums if she needs to.

## 2012-03-29 DIAGNOSIS — K2100 Gastro-esophageal reflux disease with esophagitis, without bleeding: Secondary | ICD-10-CM

## 2012-03-29 HISTORY — DX: Gastro-esophageal reflux disease with esophagitis, without bleeding: K21.00

## 2012-03-31 DIAGNOSIS — M2550 Pain in unspecified joint: Secondary | ICD-10-CM | POA: Diagnosis not present

## 2012-03-31 DIAGNOSIS — Z4789 Encounter for other orthopedic aftercare: Secondary | ICD-10-CM | POA: Diagnosis not present

## 2012-03-31 DIAGNOSIS — R279 Unspecified lack of coordination: Secondary | ICD-10-CM | POA: Diagnosis not present

## 2012-03-31 DIAGNOSIS — M6281 Muscle weakness (generalized): Secondary | ICD-10-CM | POA: Diagnosis not present

## 2012-03-31 DIAGNOSIS — S2249XA Multiple fractures of ribs, unspecified side, initial encounter for closed fracture: Secondary | ICD-10-CM | POA: Diagnosis not present

## 2012-03-31 DIAGNOSIS — R269 Unspecified abnormalities of gait and mobility: Secondary | ICD-10-CM | POA: Diagnosis not present

## 2012-04-02 DIAGNOSIS — F411 Generalized anxiety disorder: Secondary | ICD-10-CM | POA: Diagnosis not present

## 2012-04-02 DIAGNOSIS — F329 Major depressive disorder, single episode, unspecified: Secondary | ICD-10-CM | POA: Diagnosis not present

## 2012-04-04 DIAGNOSIS — Z4789 Encounter for other orthopedic aftercare: Secondary | ICD-10-CM | POA: Diagnosis not present

## 2012-04-04 DIAGNOSIS — M2550 Pain in unspecified joint: Secondary | ICD-10-CM | POA: Diagnosis not present

## 2012-04-04 DIAGNOSIS — R279 Unspecified lack of coordination: Secondary | ICD-10-CM | POA: Diagnosis not present

## 2012-04-04 DIAGNOSIS — S2249XA Multiple fractures of ribs, unspecified side, initial encounter for closed fracture: Secondary | ICD-10-CM | POA: Diagnosis not present

## 2012-04-04 DIAGNOSIS — M6281 Muscle weakness (generalized): Secondary | ICD-10-CM | POA: Diagnosis not present

## 2012-04-04 DIAGNOSIS — R269 Unspecified abnormalities of gait and mobility: Secondary | ICD-10-CM | POA: Diagnosis not present

## 2012-04-22 ENCOUNTER — Encounter: Payer: Self-pay | Admitting: Cardiology

## 2012-04-22 ENCOUNTER — Ambulatory Visit (INDEPENDENT_AMBULATORY_CARE_PROVIDER_SITE_OTHER): Payer: Medicare Other | Admitting: Cardiology

## 2012-04-22 VITALS — BP 114/78 | HR 60 | Ht 66.0 in | Wt 147.0 lb

## 2012-04-22 DIAGNOSIS — R0602 Shortness of breath: Secondary | ICD-10-CM | POA: Diagnosis not present

## 2012-04-22 DIAGNOSIS — I509 Heart failure, unspecified: Secondary | ICD-10-CM

## 2012-04-22 DIAGNOSIS — E7800 Pure hypercholesterolemia, unspecified: Secondary | ICD-10-CM

## 2012-04-22 DIAGNOSIS — I2510 Atherosclerotic heart disease of native coronary artery without angina pectoris: Secondary | ICD-10-CM

## 2012-04-22 DIAGNOSIS — I4891 Unspecified atrial fibrillation: Secondary | ICD-10-CM

## 2012-04-22 DIAGNOSIS — E785 Hyperlipidemia, unspecified: Secondary | ICD-10-CM

## 2012-04-22 NOTE — Patient Instructions (Addendum)
Please return tomorrow for fasting lab work (lipid, Digoxin, BMP and BNP)  The current medical regimen is effective;  continue present plan and medications.  Your physician has requested that you have an echocardiogram in 4 months. Echocardiography is a painless test that uses sound waves to create images of your heart. It provides your doctor with information about the size and shape of your heart and how well your heart's chambers and valves are working. This procedure takes approximately one hour. There are no restrictions for this procedure.  Follow up in 4 months with Dr Shirlee Latch.  You will receive a letter in the mail 2 months before you are due.  Please call us when you receive this letter to schedule your follow up appointment.

## 2012-04-22 NOTE — Assessment & Plan Note (Signed)
Given lower EF and inferior basal wall motion abnormality, I have been concerned that the stenosis in her RCA could have progressed.  No recent chest pain.  I will continue on current plan for medical management with ASA 81, statin, Coreg, losartan.

## 2012-04-22 NOTE — Assessment & Plan Note (Signed)
Check lipids/LFTs with goal LDL < 70.  
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
## 2012-04-22 NOTE — Assessment & Plan Note (Signed)
EF mildly decreased on last echo. NYHA class II symptoms.  She is on digoxin, I will check a level.  Check BMET/BNP also.

## 2012-04-23 ENCOUNTER — Other Ambulatory Visit (INDEPENDENT_AMBULATORY_CARE_PROVIDER_SITE_OTHER): Payer: Medicare Other

## 2012-04-23 DIAGNOSIS — R0602 Shortness of breath: Secondary | ICD-10-CM

## 2012-04-23 DIAGNOSIS — I4891 Unspecified atrial fibrillation: Secondary | ICD-10-CM

## 2012-04-23 DIAGNOSIS — E7800 Pure hypercholesterolemia, unspecified: Secondary | ICD-10-CM

## 2012-04-23 LAB — LIPID PANEL
HDL: 52.8 mg/dL (ref >39.00)
Total CHOL/HDL Ratio: 2

## 2012-04-23 LAB — BASIC METABOLIC PANEL
CO2: 34 mEq/L — ABNORMAL HIGH (ref 19–32)
Calcium: 10 mg/dL (ref 8.4–10.5)
Chloride: 104 mEq/L (ref 96–112)
Glucose, Bld: 86 mg/dL (ref 70–99)
Sodium: 144 mEq/L (ref 135–145)

## 2012-04-24 LAB — DIGOXIN LEVEL: Digoxin Level: <0.1 ng/mL — ABNORMAL LOW (ref 0.8–2.0)

## 2012-06-17 DIAGNOSIS — I10 Essential (primary) hypertension: Secondary | ICD-10-CM | POA: Diagnosis not present

## 2012-06-17 DIAGNOSIS — E039 Hypothyroidism, unspecified: Secondary | ICD-10-CM | POA: Diagnosis not present

## 2012-06-17 DIAGNOSIS — E785 Hyperlipidemia, unspecified: Secondary | ICD-10-CM | POA: Diagnosis not present

## 2012-06-17 DIAGNOSIS — I509 Heart failure, unspecified: Secondary | ICD-10-CM | POA: Diagnosis not present

## 2012-06-25 DIAGNOSIS — E785 Hyperlipidemia, unspecified: Secondary | ICD-10-CM | POA: Diagnosis not present

## 2012-06-25 DIAGNOSIS — E039 Hypothyroidism, unspecified: Secondary | ICD-10-CM | POA: Diagnosis not present

## 2012-06-25 DIAGNOSIS — F411 Generalized anxiety disorder: Secondary | ICD-10-CM | POA: Diagnosis not present

## 2012-06-25 DIAGNOSIS — F329 Major depressive disorder, single episode, unspecified: Secondary | ICD-10-CM | POA: Diagnosis not present

## 2012-07-03 ENCOUNTER — Other Ambulatory Visit: Payer: Self-pay | Admitting: Cardiology

## 2012-08-12 DIAGNOSIS — Z23 Encounter for immunization: Secondary | ICD-10-CM | POA: Diagnosis not present

## 2012-08-16 ENCOUNTER — Other Ambulatory Visit: Payer: Self-pay | Admitting: Cardiology

## 2012-08-19 DIAGNOSIS — H35369 Drusen (degenerative) of macula, unspecified eye: Secondary | ICD-10-CM | POA: Diagnosis not present

## 2012-08-19 DIAGNOSIS — H353190 Nonexudative age-related macular degeneration, unspecified eye, stage unspecified: Secondary | ICD-10-CM | POA: Diagnosis not present

## 2012-08-29 DIAGNOSIS — J209 Acute bronchitis, unspecified: Secondary | ICD-10-CM

## 2012-08-29 DIAGNOSIS — N183 Chronic kidney disease, stage 3 unspecified: Secondary | ICD-10-CM

## 2012-08-29 HISTORY — DX: Acute bronchitis, unspecified: J20.9

## 2012-08-29 HISTORY — DX: Chronic kidney disease, stage 3 unspecified: N18.30

## 2012-09-02 DIAGNOSIS — R1900 Intra-abdominal and pelvic swelling, mass and lump, unspecified site: Secondary | ICD-10-CM | POA: Diagnosis not present

## 2012-09-08 DIAGNOSIS — J209 Acute bronchitis, unspecified: Secondary | ICD-10-CM | POA: Diagnosis not present

## 2012-09-16 DIAGNOSIS — I10 Essential (primary) hypertension: Secondary | ICD-10-CM | POA: Diagnosis not present

## 2012-09-16 DIAGNOSIS — E785 Hyperlipidemia, unspecified: Secondary | ICD-10-CM | POA: Diagnosis not present

## 2012-09-24 DIAGNOSIS — E785 Hyperlipidemia, unspecified: Secondary | ICD-10-CM | POA: Diagnosis not present

## 2012-09-24 DIAGNOSIS — M48061 Spinal stenosis, lumbar region without neurogenic claudication: Secondary | ICD-10-CM | POA: Diagnosis not present

## 2012-09-24 DIAGNOSIS — J209 Acute bronchitis, unspecified: Secondary | ICD-10-CM | POA: Diagnosis not present

## 2012-10-29 DIAGNOSIS — J189 Pneumonia, unspecified organism: Secondary | ICD-10-CM

## 2012-10-29 HISTORY — DX: Pneumonia, unspecified organism: J18.9

## 2012-11-01 ENCOUNTER — Other Ambulatory Visit: Payer: Self-pay | Admitting: Cardiology

## 2012-11-05 DIAGNOSIS — L0231 Cutaneous abscess of buttock: Secondary | ICD-10-CM | POA: Diagnosis not present

## 2012-11-05 DIAGNOSIS — R1033 Periumbilical pain: Secondary | ICD-10-CM | POA: Diagnosis not present

## 2012-11-05 DIAGNOSIS — L03317 Cellulitis of buttock: Secondary | ICD-10-CM | POA: Diagnosis not present

## 2012-11-05 DIAGNOSIS — R195 Other fecal abnormalities: Secondary | ICD-10-CM | POA: Diagnosis not present

## 2012-11-14 ENCOUNTER — Other Ambulatory Visit: Payer: Self-pay | Admitting: Physician Assistant

## 2012-11-17 DIAGNOSIS — F329 Major depressive disorder, single episode, unspecified: Secondary | ICD-10-CM | POA: Diagnosis not present

## 2012-11-17 DIAGNOSIS — J209 Acute bronchitis, unspecified: Secondary | ICD-10-CM | POA: Diagnosis not present

## 2012-11-17 DIAGNOSIS — L0591 Pilonidal cyst without abscess: Secondary | ICD-10-CM | POA: Diagnosis not present

## 2012-11-18 DIAGNOSIS — L0591 Pilonidal cyst without abscess: Secondary | ICD-10-CM | POA: Diagnosis not present

## 2012-11-18 DIAGNOSIS — R918 Other nonspecific abnormal finding of lung field: Secondary | ICD-10-CM | POA: Diagnosis not present

## 2012-11-18 DIAGNOSIS — J209 Acute bronchitis, unspecified: Secondary | ICD-10-CM | POA: Diagnosis not present

## 2012-11-18 DIAGNOSIS — J189 Pneumonia, unspecified organism: Secondary | ICD-10-CM | POA: Diagnosis not present

## 2012-11-20 DIAGNOSIS — R279 Unspecified lack of coordination: Secondary | ICD-10-CM | POA: Diagnosis not present

## 2012-11-20 DIAGNOSIS — R5381 Other malaise: Secondary | ICD-10-CM | POA: Diagnosis not present

## 2012-11-20 DIAGNOSIS — R269 Unspecified abnormalities of gait and mobility: Secondary | ICD-10-CM | POA: Diagnosis not present

## 2012-11-20 DIAGNOSIS — J189 Pneumonia, unspecified organism: Secondary | ICD-10-CM | POA: Diagnosis not present

## 2012-11-20 DIAGNOSIS — R5383 Other fatigue: Secondary | ICD-10-CM | POA: Diagnosis not present

## 2012-11-20 DIAGNOSIS — M6281 Muscle weakness (generalized): Secondary | ICD-10-CM | POA: Diagnosis not present

## 2012-11-21 DIAGNOSIS — R269 Unspecified abnormalities of gait and mobility: Secondary | ICD-10-CM | POA: Diagnosis not present

## 2012-11-21 DIAGNOSIS — J189 Pneumonia, unspecified organism: Secondary | ICD-10-CM | POA: Diagnosis not present

## 2012-11-21 DIAGNOSIS — M6281 Muscle weakness (generalized): Secondary | ICD-10-CM | POA: Diagnosis not present

## 2012-11-21 DIAGNOSIS — R279 Unspecified lack of coordination: Secondary | ICD-10-CM | POA: Diagnosis not present

## 2012-11-21 DIAGNOSIS — R5383 Other fatigue: Secondary | ICD-10-CM | POA: Diagnosis not present

## 2012-11-22 DIAGNOSIS — J189 Pneumonia, unspecified organism: Secondary | ICD-10-CM | POA: Diagnosis not present

## 2012-11-22 DIAGNOSIS — R5383 Other fatigue: Secondary | ICD-10-CM | POA: Diagnosis not present

## 2012-11-22 DIAGNOSIS — M6281 Muscle weakness (generalized): Secondary | ICD-10-CM | POA: Diagnosis not present

## 2012-11-22 DIAGNOSIS — R279 Unspecified lack of coordination: Secondary | ICD-10-CM | POA: Diagnosis not present

## 2012-11-22 DIAGNOSIS — R269 Unspecified abnormalities of gait and mobility: Secondary | ICD-10-CM | POA: Diagnosis not present

## 2012-11-24 DIAGNOSIS — R5383 Other fatigue: Secondary | ICD-10-CM | POA: Diagnosis not present

## 2012-11-24 DIAGNOSIS — R279 Unspecified lack of coordination: Secondary | ICD-10-CM | POA: Diagnosis not present

## 2012-11-24 DIAGNOSIS — R269 Unspecified abnormalities of gait and mobility: Secondary | ICD-10-CM | POA: Diagnosis not present

## 2012-11-24 DIAGNOSIS — J189 Pneumonia, unspecified organism: Secondary | ICD-10-CM | POA: Diagnosis not present

## 2012-11-24 DIAGNOSIS — M6281 Muscle weakness (generalized): Secondary | ICD-10-CM | POA: Diagnosis not present

## 2012-11-25 DIAGNOSIS — M6281 Muscle weakness (generalized): Secondary | ICD-10-CM | POA: Diagnosis not present

## 2012-11-25 DIAGNOSIS — R279 Unspecified lack of coordination: Secondary | ICD-10-CM | POA: Diagnosis not present

## 2012-11-25 DIAGNOSIS — J209 Acute bronchitis, unspecified: Secondary | ICD-10-CM | POA: Diagnosis not present

## 2012-11-25 DIAGNOSIS — R269 Unspecified abnormalities of gait and mobility: Secondary | ICD-10-CM | POA: Diagnosis not present

## 2012-11-25 DIAGNOSIS — R5381 Other malaise: Secondary | ICD-10-CM | POA: Diagnosis not present

## 2012-11-25 DIAGNOSIS — J189 Pneumonia, unspecified organism: Secondary | ICD-10-CM | POA: Diagnosis not present

## 2012-11-26 DIAGNOSIS — M6281 Muscle weakness (generalized): Secondary | ICD-10-CM | POA: Diagnosis not present

## 2012-11-26 DIAGNOSIS — J189 Pneumonia, unspecified organism: Secondary | ICD-10-CM | POA: Diagnosis not present

## 2012-11-26 DIAGNOSIS — R269 Unspecified abnormalities of gait and mobility: Secondary | ICD-10-CM | POA: Diagnosis not present

## 2012-11-26 DIAGNOSIS — R279 Unspecified lack of coordination: Secondary | ICD-10-CM | POA: Diagnosis not present

## 2012-11-26 DIAGNOSIS — R5381 Other malaise: Secondary | ICD-10-CM | POA: Diagnosis not present

## 2012-11-27 DIAGNOSIS — R269 Unspecified abnormalities of gait and mobility: Secondary | ICD-10-CM | POA: Diagnosis not present

## 2012-11-27 DIAGNOSIS — R279 Unspecified lack of coordination: Secondary | ICD-10-CM | POA: Diagnosis not present

## 2012-11-27 DIAGNOSIS — M6281 Muscle weakness (generalized): Secondary | ICD-10-CM | POA: Diagnosis not present

## 2012-11-27 DIAGNOSIS — J189 Pneumonia, unspecified organism: Secondary | ICD-10-CM | POA: Diagnosis not present

## 2012-11-27 DIAGNOSIS — R5383 Other fatigue: Secondary | ICD-10-CM | POA: Diagnosis not present

## 2012-11-28 DIAGNOSIS — R5383 Other fatigue: Secondary | ICD-10-CM | POA: Diagnosis not present

## 2012-11-28 DIAGNOSIS — M6281 Muscle weakness (generalized): Secondary | ICD-10-CM | POA: Diagnosis not present

## 2012-11-28 DIAGNOSIS — R279 Unspecified lack of coordination: Secondary | ICD-10-CM | POA: Diagnosis not present

## 2012-11-28 DIAGNOSIS — J189 Pneumonia, unspecified organism: Secondary | ICD-10-CM | POA: Diagnosis not present

## 2012-11-28 DIAGNOSIS — R269 Unspecified abnormalities of gait and mobility: Secondary | ICD-10-CM | POA: Diagnosis not present

## 2012-12-01 DIAGNOSIS — R279 Unspecified lack of coordination: Secondary | ICD-10-CM | POA: Diagnosis not present

## 2012-12-01 DIAGNOSIS — J189 Pneumonia, unspecified organism: Secondary | ICD-10-CM | POA: Diagnosis not present

## 2012-12-01 DIAGNOSIS — M6281 Muscle weakness (generalized): Secondary | ICD-10-CM | POA: Diagnosis not present

## 2012-12-01 DIAGNOSIS — R5381 Other malaise: Secondary | ICD-10-CM | POA: Diagnosis not present

## 2012-12-01 DIAGNOSIS — R269 Unspecified abnormalities of gait and mobility: Secondary | ICD-10-CM | POA: Diagnosis not present

## 2012-12-02 DIAGNOSIS — J209 Acute bronchitis, unspecified: Secondary | ICD-10-CM | POA: Diagnosis not present

## 2012-12-02 DIAGNOSIS — J189 Pneumonia, unspecified organism: Secondary | ICD-10-CM | POA: Diagnosis not present

## 2012-12-06 DIAGNOSIS — R197 Diarrhea, unspecified: Secondary | ICD-10-CM | POA: Diagnosis not present

## 2012-12-08 DIAGNOSIS — J189 Pneumonia, unspecified organism: Secondary | ICD-10-CM | POA: Diagnosis not present

## 2012-12-08 DIAGNOSIS — F411 Generalized anxiety disorder: Secondary | ICD-10-CM | POA: Diagnosis not present

## 2012-12-09 DIAGNOSIS — E039 Hypothyroidism, unspecified: Secondary | ICD-10-CM | POA: Diagnosis not present

## 2012-12-09 DIAGNOSIS — I4891 Unspecified atrial fibrillation: Secondary | ICD-10-CM | POA: Diagnosis not present

## 2012-12-09 DIAGNOSIS — Z79899 Other long term (current) drug therapy: Secondary | ICD-10-CM | POA: Diagnosis not present

## 2012-12-09 DIAGNOSIS — I10 Essential (primary) hypertension: Secondary | ICD-10-CM | POA: Diagnosis not present

## 2012-12-12 DIAGNOSIS — J189 Pneumonia, unspecified organism: Secondary | ICD-10-CM | POA: Diagnosis not present

## 2012-12-12 DIAGNOSIS — F411 Generalized anxiety disorder: Secondary | ICD-10-CM | POA: Diagnosis not present

## 2012-12-12 DIAGNOSIS — R197 Diarrhea, unspecified: Secondary | ICD-10-CM | POA: Diagnosis not present

## 2012-12-12 DIAGNOSIS — R5381 Other malaise: Secondary | ICD-10-CM | POA: Diagnosis not present

## 2012-12-12 DIAGNOSIS — R109 Unspecified abdominal pain: Secondary | ICD-10-CM | POA: Diagnosis not present

## 2012-12-16 DIAGNOSIS — F411 Generalized anxiety disorder: Secondary | ICD-10-CM | POA: Diagnosis not present

## 2012-12-16 DIAGNOSIS — R1013 Epigastric pain: Secondary | ICD-10-CM | POA: Diagnosis not present

## 2012-12-17 DIAGNOSIS — R0989 Other specified symptoms and signs involving the circulatory and respiratory systems: Secondary | ICD-10-CM | POA: Diagnosis not present

## 2012-12-18 ENCOUNTER — Other Ambulatory Visit: Payer: Self-pay | Admitting: Gastroenterology

## 2012-12-18 DIAGNOSIS — R1013 Epigastric pain: Secondary | ICD-10-CM | POA: Diagnosis not present

## 2012-12-18 DIAGNOSIS — R195 Other fecal abnormalities: Secondary | ICD-10-CM | POA: Diagnosis not present

## 2012-12-19 ENCOUNTER — Encounter (INDEPENDENT_AMBULATORY_CARE_PROVIDER_SITE_OTHER): Payer: Self-pay | Admitting: Surgery

## 2012-12-22 ENCOUNTER — Ambulatory Visit
Admission: RE | Admit: 2012-12-22 | Discharge: 2012-12-22 | Disposition: A | Discharge status: Home / Self Care | Payer: Medicare Other | Source: Ambulatory Visit | Attending: Gastroenterology | Admitting: Gastroenterology

## 2012-12-22 DIAGNOSIS — K8020 Calculus of gallbladder without cholecystitis without obstruction: Secondary | ICD-10-CM | POA: Diagnosis not present

## 2012-12-22 DIAGNOSIS — R1013 Epigastric pain: Secondary | ICD-10-CM

## 2012-12-23 ENCOUNTER — Ambulatory Visit (INDEPENDENT_AMBULATORY_CARE_PROVIDER_SITE_OTHER): Payer: Medicare Other | Admitting: Surgery

## 2012-12-24 ENCOUNTER — Other Ambulatory Visit: Payer: Self-pay | Admitting: Gastroenterology

## 2012-12-24 DIAGNOSIS — D1330 Benign neoplasm of unspecified part of small intestine: Secondary | ICD-10-CM | POA: Diagnosis not present

## 2012-12-24 DIAGNOSIS — K259 Gastric ulcer, unspecified as acute or chronic, without hemorrhage or perforation: Secondary | ICD-10-CM | POA: Diagnosis not present

## 2012-12-24 DIAGNOSIS — K319 Disease of stomach and duodenum, unspecified: Secondary | ICD-10-CM | POA: Diagnosis not present

## 2012-12-24 DIAGNOSIS — D131 Benign neoplasm of stomach: Secondary | ICD-10-CM | POA: Diagnosis not present

## 2012-12-24 DIAGNOSIS — R1013 Epigastric pain: Secondary | ICD-10-CM | POA: Diagnosis not present

## 2012-12-26 ENCOUNTER — Ambulatory Visit
Admission: RE | Admit: 2012-12-26 | Discharge: 2012-12-26 | Disposition: A | Discharge status: Home / Self Care | Payer: Medicare Other | Source: Ambulatory Visit | Attending: Gastroenterology | Admitting: Gastroenterology

## 2012-12-26 DIAGNOSIS — R1013 Epigastric pain: Secondary | ICD-10-CM | POA: Diagnosis not present

## 2012-12-26 DIAGNOSIS — N949 Unspecified condition associated with female genital organs and menstrual cycle: Secondary | ICD-10-CM | POA: Diagnosis not present

## 2012-12-26 DIAGNOSIS — K8020 Calculus of gallbladder without cholecystitis without obstruction: Secondary | ICD-10-CM | POA: Diagnosis not present

## 2012-12-26 DIAGNOSIS — R933 Abnormal findings on diagnostic imaging of other parts of digestive tract: Secondary | ICD-10-CM | POA: Diagnosis not present

## 2012-12-26 DIAGNOSIS — K551 Chronic vascular disorders of intestine: Secondary | ICD-10-CM | POA: Diagnosis not present

## 2012-12-26 MED ORDER — IOHEXOL 350 MG/ML SOLN
80.0000 mL | Freq: Once | INTRAVENOUS | Status: AC | PRN
  Administered 2012-12-26: 80 mL via INTRAVENOUS

## 2012-12-27 DIAGNOSIS — K8020 Calculus of gallbladder without cholecystitis without obstruction: Secondary | ICD-10-CM

## 2012-12-27 DIAGNOSIS — K559 Vascular disorder of intestine, unspecified: Secondary | ICD-10-CM

## 2012-12-27 HISTORY — DX: Vascular disorder of intestine, unspecified: K55.9

## 2012-12-27 HISTORY — DX: Calculus of gallbladder without cholecystitis without obstruction: K80.20

## 2012-12-29 ENCOUNTER — Other Ambulatory Visit: Payer: Self-pay | Admitting: Gastroenterology

## 2012-12-29 DIAGNOSIS — M6281 Muscle weakness (generalized): Secondary | ICD-10-CM | POA: Diagnosis not present

## 2012-12-29 DIAGNOSIS — R279 Unspecified lack of coordination: Secondary | ICD-10-CM | POA: Diagnosis not present

## 2012-12-29 DIAGNOSIS — R5381 Other malaise: Secondary | ICD-10-CM | POA: Diagnosis not present

## 2012-12-29 DIAGNOSIS — R269 Unspecified abnormalities of gait and mobility: Secondary | ICD-10-CM | POA: Diagnosis not present

## 2012-12-29 DIAGNOSIS — J189 Pneumonia, unspecified organism: Secondary | ICD-10-CM | POA: Diagnosis not present

## 2012-12-31 ENCOUNTER — Encounter (INDEPENDENT_AMBULATORY_CARE_PROVIDER_SITE_OTHER): Payer: Self-pay | Admitting: General Surgery

## 2012-12-31 ENCOUNTER — Ambulatory Visit
Admission: RE | Admit: 2012-12-31 | Discharge: 2012-12-31 | Disposition: A | Discharge status: Home / Self Care | Payer: Medicare Other | Source: Ambulatory Visit | Attending: Gastroenterology | Admitting: Gastroenterology

## 2012-12-31 ENCOUNTER — Ambulatory Visit (INDEPENDENT_AMBULATORY_CARE_PROVIDER_SITE_OTHER): Payer: Medicare Other | Admitting: General Surgery

## 2012-12-31 VITALS — BP 110/68 | HR 63 | Temp 97.4°F | Resp 18 | Ht 66.0 in | Wt 128.6 lb

## 2012-12-31 DIAGNOSIS — K551 Chronic vascular disorders of intestine: Secondary | ICD-10-CM

## 2012-12-31 DIAGNOSIS — K55059 Acute (reversible) ischemia of intestine, part and extent unspecified: Secondary | ICD-10-CM | POA: Diagnosis not present

## 2012-12-31 DIAGNOSIS — I748 Embolism and thrombosis of other arteries: Secondary | ICD-10-CM | POA: Diagnosis not present

## 2012-12-31 DIAGNOSIS — K8020 Calculus of gallbladder without cholecystitis without obstruction: Secondary | ICD-10-CM | POA: Diagnosis not present

## 2012-12-31 NOTE — Progress Notes (Signed)
Patient ID: Debra Brooks, female   DOB: Mar 03, 1925, 79 y.o.   MRN: [id number]  Chief Complaint  Patient presents with  . New Evaluation    eval abd pain    HPI Debra Brooks is a 79 y.o. female.  This patient was referred by Dr. Matthias Hughs for evaluation of six-week history of abdominal pain and weight loss. Prior to 6 weeks ago she was feeling well and living in an active lifestyle but over that time she has developed pain associated with hearing. She says that she has been able to keep food down without any significant vomiting but does complain of frequent nausea and upper abdominal and epigastric pain which spreads across her upper abdomen and lasts about 45 minutes to one hour. She says that this occurs while eating and shortly thereafter but will then reoccur and is also somewhat relieved by eating again. To try to compensate, she has been eating small and more frequent meals and tried to stay on a bland diet but this is a lady in any significant improvement for her. She recently had an EGD which was negative and she denies any melena in her stools. She does have a history of colon cancer and had surgery for this about 22 years ago but she had a normal colonoscopy 3 years ago per the patient report.  She also says that she has been on Prilosec for the last 2 weeks and Zantac prior to that without any improvement in her symptoms. She does have a history of atrial fibrillation which was treated with ablation and she is followed by Dr. Graciela Husbands for this. She also has a history of breast cancer which was treated by lumpectomy in 2001 and she states that she is due for a mammogram. She did have an ultrasound of the abdomen which demonstrated cholelithiasis and sludge. She also had a CT scan of the abdomen which demonstrated significant blockages in her celiac axis and she actually saw the interventional radiologist or vascular surgeon this morning and he recommended interventional treatment for  this. HPI  Past Medical History  Diagnosis Date  . Hyperlipidemia   . Hypertension   . IHD (ischemic heart disease)     Cath in 2010 showed 60% ostial RCA lesion. She is managed medically  . Fatigue   . Palpitations   . Joint pain   . Anxiety and depression   . Breast cancer   . History of colon cancer   . Tricuspid regurgitation     ef 55-60%  . Atrial flutter     s/p ablation in 2007; She is intolerant to amiodarone  . Chronic edema     Past Surgical History  Procedure Laterality Date  . Cardiac catheterization  2010    60% ostial RCA lesion. Managed medically  . Tonsillectomy    . Appendectomy    . Colectomy    . Breast surgery    . Bladder tacking      Family History  Problem Relation Age of Onset  . Cancer Mother   . Heart attack Father     Social History History  Substance Use Topics  . Smoking status: Former Smoker    Quit date: 10/29/1985  . Smokeless tobacco: Not on file  . Alcohol Use: No    Allergies  Allergen Reactions  . Iodinated Diagnostic Agents     Current Outpatient Prescriptions  Medication Sig Dispense Refill  . ALPRAZolam (XANAX) 0.5 MG tablet Take 0.25 mg by mouth 2 (  two) times daily. Morning and bedtime      . aspirin 81 MG tablet Take 81 mg by mouth daily.        Marland Kitchen atorvastatin (LIPITOR) 10 MG tablet Take 10 mg by mouth daily.       Marland Kitchen buPROPion (WELLBUTRIN XL) 300 MG 24 hr tablet Take 300 mg by mouth daily.        . Calcium Carbonate-Vitamin D (CALCIUM + D PO) Take 2,000 Units by mouth daily.        . carvedilol (COREG) 12.5 MG tablet TAKE 1 TABLET TWICE DAILY WITH A MEAL.  60 tablet  5  . Cholecalciferol (VITAMIN D3) 1000 UNITS CAPS Take by mouth daily.        Marland Kitchen COZAAR 50 MG tablet TAKE 1 TABLET EACH DAY.  30 tablet  6  . digoxin (LANOXIN) 0.25 MG tablet Take 125 mcg by mouth daily.      . famotidine (PEPCID) 20 MG tablet TAKE 1 TABLET TWICE DAILY.  60 tablet  2  . fish oil-omega-3 fatty acids 1000 MG capsule Take by mouth  daily.        . furosemide (LASIX) 40 MG tablet Take 40 mg by mouth 2 (two) times daily. Breakfast and lunch      . HYDROcodone-acetaminophen (LORCET) 10-650 MG per tablet Take 0.5 tablets by mouth every 4 (four) hours as needed for pain.  9 tablet  0  . ipratropium-albuterol (DUONEB) 0.5-2.5 (3) MG/3ML SOLN       . KLOR-CON M20 20 MEQ tablet       . levothyroxine (SYNTHROID, LEVOTHROID) 75 MCG tablet Take 75 mcg by mouth daily.        . Multiple Vitamins-Minerals (ICAPS) TABS Take 1 tablet by mouth 2 (two) times daily. Morning and bedtime      . nitroGLYCERIN (NITROSTAT) 0.4 MG SL tablet Place 0.4 mg under the tongue every 5 (five) minutes as needed.        Marland Kitchen omeprazole (PRILOSEC) 20 MG capsule       . Polyethylene Glycol 3350 (MIRALAX PO) Take 17 g by mouth at bedtime.       . pregabalin (LYRICA) 50 MG capsule Take 50 mg by mouth 2 (two) times daily.        . ursodiol (ACTIGALL) 300 MG capsule        No current facility-administered medications for this visit.    Review of Systems Review of Systems All other review of systems negative or noncontributory except as stated in the HPI  Blood pressure 110/68, pulse 63, temperature 97.4 F (36.3 C), temperature source Temporal, resp. rate 18, height 5\' 6"  (1.676 m), weight 128 lb 9.6 oz (58.333 kg).  Physical Exam Physical Exam Physical Exam  Nursing note and vitals reviewed. Constitutional: She is oriented to person, place, and time. She appears well-developed and well-nourished. No distress.  HENT:  Head: Normocephalic and atraumatic.  Mouth/Throat: No oropharyngeal exudate.  Eyes: Conjunctivae and EOM are normal. Pupils are equal, round, and reactive to light. Right eye exhibits no discharge. Left eye exhibits no discharge. No scleral icterus.  Neck: Normal range of motion. Neck supple. No tracheal deviation present.  Cardiovascular: Normal rate, regular rhythm, normal heart sounds and intact distal pulses.   Pulmonary/Chest: Effort  normal and breath sounds normal. No stridor. No respiratory distress. She has no wheezes.  Abdominal: Soft. Bowel sounds are normal. She exhibits no distension and no mass. There is mild upper abdominal tenderness. There is  no rebound and no guarding.  Musculoskeletal: Normal range of motion. She exhibits no edema and no tenderness.  Neurological: She is alert and oriented to person, place, and time.  Skin: Skin is warm and dry. No rash noted. She is not diaphoretic. No erythema. No pallor.  Psychiatric: She has a normal mood and affect. Her behavior is normal. Judgment and thought content normal.    Data Reviewed Outside records, CT, [place], path reports  Assessment    Abdominal pain and weight loss She has had a significant weight loss over the last 6 weeks associated with abdominal pain and eating. She does have cholelithiasis on ultrasound and this certainly could be due to her gallstones however she also has significant mesenteric vascular disease as well. Given her age and her symptoms think it is very reasonable to she undergo treatment for the more life-threatening issue first which would be treatment of her mesenteric vascular disease. This may or may not improve her symptoms and if her symptoms persist after treatment of her mesenteric vessels then we would certainly be happy to offer her cholecystectomy for possible relief of her symptoms. This is a somewhat difficult decision because either one of these problems could be causing her symptoms but I think that it would be reasonable to proceed with the more life-threatening problem first which is the vascular disease prior to elective cholecystectomy     Plan    She is already scheduled intervention for her vascular disease which I think is appropriate. She will call back after she has this treatment and we will reevaluate her at that time. If she remains symptomatic, and we will go ahead and proceed with cholecystectomy.        Lodema Pilot DAVID 12/31/2012, 2:21 PM

## 2013-01-02 NOTE — Progress Notes (Signed)
Patient ID: Debra Brooks, female   DOB: 1925/09/26, 79 y.o.   MRN: [id number]  NEW PATIENT OFFICE VISIT  December 31, 2012  Bernette Redbird, MD [facility] [address] [place], [place] [address]  Re: Debra Brooks (DOB: 05/06/1925)  Dear Nadine Counts:  Thank you for sending Mrs. Debra Brooks for evaluation regarding possible treatment options for symptomatic mesenteric arterial occlusive disease. The patient is an 79 year old female who has had approximately a two month course of significant epigastric abdominal pain which is predominantly post prandial in nature but also occurs at night and affects sleep. She has associated food aversion with poor appetite and has lost approximately 15 pounds in weight since January. Her symptoms seem to have been exacerbated by pneumonia which was treated on an outpatient basis in January. The patient has had some periodic diarrhea. She denies melena or hematochezia. Recent EGD on 12/24/12 demonstrated edema and erythema in the prepyloric region of the stomach and shallow linear gastric ulcers along the greater curvature. Gastric biopsy showed intravascular microthrombi with reactive/regenerative change suggestive of ischemic changes.  Past Medical History: 1. History of atrial flutter and status post cardiac ablation in 2007 by Dr. Graciela Husbands. The patient also has a history of coronary artery disease and is followed by Dr. Marca Ancona. She has not required percutaneous coronary intervention. 2. History of rectal carcinoma in approximately 1991, treated with low anterior resection and chemotherapy. There has been no evidence of tumor recurrence. 3. Cholelithiasis with ultrasound on 12/22/12 demonstrating multiple gallstones. The patient is being referred to Dr. Sherilyn Cooter. 4. History of hypertension. The patient's primary care physician is Dr. Frederik Pear. 5. History of hypothyroidism. 6. History of chronic lower extremity edema without  DVT. 7. History of anxiety and depression. 8. History of osteoarthritis. 9. Remote appendectomy. 10. History of ovarian mass which has not been treated. History of left breast carcinoma with lumpectomy performed by Dr. Wenda Low in 2004.  Medications: Digoxin 0.25 mg half a tablet daily, Pepcid 20 mg daily, MiraLax prn, Lasix 40 mg daily, Lyrica 50 mg daily, Vicodin, Florastor 250 mg daily, Wellbutrin XL 300 mg daily, Synthroid 75 mcg daily, nitroglycerin 0.4 mg sublingual as needed, Losartin, potassium 50 mEq daily, aspirin 81 mg daily, Coreg 12.5 mg twice daily, ursodiol 300 mg twice daily. Page 2 Re: Debra Brooks (DOB: 05/17/1925)  Allergies: Iodinated contrast material. The patient has had no contrast reactions with appropriate premedication.  Social History: The patient is widowed. She has five children. She lives at [facility] nursing home. She denies tobacco or alcohol use.  Family History: Family history of heart disease in multiple family members.  Review of Systems: Height 5'6", weight 136 pounds. Ears, nose, throat: Some chronic hearing loss. Genitourinary: No urinary symptoms or hematuria. Heart: No chest pain or palpitations. Musculoskeletal: No joint or muscle pain. Neurologic: Periodic confusion and mild memory loss. Psychiatric: History of anxiety and depression. Respiratory: No cough, mild shortness of breath.  Physical Exam: Vital signs: Blood pressure 154/60, pulse 62, respirations 14, temperature not recorded. General: No acute distress. Chest: Clear to auscultation bilaterally. Heart: Regular rate and rhythm. Abdomen: Soft. Nondistended. Mild epigastric tenderness to palpation. Extremities: 2+ bilateral lower extremity edema.  Labs: BUN 21, creatinine 1.0 and estimated GFR 56 ml/minute. Hemoglobin 12.3, hematocrit 38.9, platelet count 191.  Imaging: CTA on 12/26/12 was reviewed demonstrating evidence of significant focal stenosis of the celiac axis  origin of approximately 70-80% with heavily calcified plaque present. Significant stenosis of the  proximal superior mesenteric artery also noted with near subtotal occlusive appearance by CTA. The inferior mesenteric artery supplies collaterals to bowel. Cystic structure measuring 5 cm in maximal diameter is present in the right pelvis.  Assessment: I met with Debra Brooks and her daughter, Debra Brooks. We reviewed CTA findings that demonstrate significant occlusive disease involving the celiac axis and superior mesenteric artery origins. This certainly could be the cause of mesenteric arterial insufficiency to bowel with associated intestinal angina, weight loss and EGD findings suggestive of gastric ischemia.  Transcatheter treatment options were reviewed, including arteriography to confirm significant occlusive disease followed by possible angioplasty and/or stenting to treat the proximal celiac axis and superior mesenteric artery. The patient's symptoms are fairly convincing for mesenteric arterial insufficiency. Despite her age, the patient is quite functional and has been significantly impaired by pain and associated food aversion. I told the patient that I do believe that she could potentially have significant improvement in symptoms should we be able to successfully treat one or both significant occlusions. Complications were also reviewed with the patient, including bleeding, atheroembolic event or vessel occlusion. Based on severity of disease, it may also not be possible to successfully advance a guidewire across the origins of one or both vessels.  After detailed discussion, the patient would like to proceed with elective arteriography and possible transcatheter intervention at that time. I told her to remain on aspirin currently and not stop it for any reason prior to the procedure. We will begin the scheduling process. Based on current schedule and time off,  I anticipate that a procedure will be able to be Page 3 Re: Debra Brooks (DOB: 09/22/1925)  performed on Monday, March 24. The procedure would be performed at [facility]. She would go home the same day after an uncomplicated procedure.  Thank you again for referring Mrs. Westergaard for evaluation. I will keep you updated with plans for arteriography with possible intervention at that time.  Sincerely,  Irish Lack, MD

## 2013-01-05 DIAGNOSIS — K551 Chronic vascular disorders of intestine: Secondary | ICD-10-CM | POA: Diagnosis not present

## 2013-01-05 DIAGNOSIS — K8020 Calculus of gallbladder without cholecystitis without obstruction: Secondary | ICD-10-CM | POA: Diagnosis not present

## 2013-01-06 ENCOUNTER — Other Ambulatory Visit (HOSPITAL_COMMUNITY): Payer: Self-pay | Admitting: Interventional Radiology

## 2013-01-06 DIAGNOSIS — R109 Unspecified abdominal pain: Secondary | ICD-10-CM

## 2013-01-06 DIAGNOSIS — R634 Abnormal weight loss: Secondary | ICD-10-CM

## 2013-01-06 DIAGNOSIS — I749 Embolism and thrombosis of unspecified artery: Secondary | ICD-10-CM

## 2013-01-13 ENCOUNTER — Encounter (HOSPITAL_COMMUNITY): Payer: Self-pay | Admitting: Pharmacy Technician

## 2013-01-15 ENCOUNTER — Other Ambulatory Visit: Payer: Self-pay | Admitting: Radiology

## 2013-01-16 ENCOUNTER — Encounter: Payer: Self-pay | Admitting: Geriatric Medicine

## 2013-01-16 ENCOUNTER — Non-Acute Institutional Stay (SKILLED_NURSING_FACILITY): Payer: Medicare Other | Admitting: Geriatric Medicine

## 2013-01-16 DIAGNOSIS — F419 Anxiety disorder, unspecified: Secondary | ICD-10-CM | POA: Insufficient documentation

## 2013-01-16 DIAGNOSIS — E876 Hypokalemia: Secondary | ICD-10-CM | POA: Diagnosis not present

## 2013-01-16 DIAGNOSIS — K8020 Calculus of gallbladder without cholecystitis without obstruction: Secondary | ICD-10-CM | POA: Insufficient documentation

## 2013-01-16 DIAGNOSIS — D72829 Elevated white blood cell count, unspecified: Secondary | ICD-10-CM | POA: Diagnosis not present

## 2013-01-16 DIAGNOSIS — K559 Vascular disorder of intestine, unspecified: Secondary | ICD-10-CM | POA: Insufficient documentation

## 2013-01-16 DIAGNOSIS — R197 Diarrhea, unspecified: Secondary | ICD-10-CM | POA: Diagnosis not present

## 2013-01-16 DIAGNOSIS — R5382 Chronic fatigue, unspecified: Secondary | ICD-10-CM | POA: Diagnosis not present

## 2013-01-16 DIAGNOSIS — Z66 Do not resuscitate: Secondary | ICD-10-CM

## 2013-01-16 NOTE — Assessment & Plan Note (Addendum)
This patient was evaluated by Dr. Fredia Sorrow. Transcatheter procedure that will include mesenteric arteriography and possible angioplasty and/or stenting is planned for Monday, 01/19/2013. Patient continues to be symptomatic with epigastric pain, nausea, diarrhea. She has had very limited food/fluid intake, has lost weight, appears dehydrated. Patient called the clinic nurse today due to over overwhelming weakness. She has been admitted to the Rehabilitation section at [facility] for IVF and general supportive treatment.
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
## 2013-01-19 ENCOUNTER — Encounter (HOSPITAL_COMMUNITY): Payer: Self-pay

## 2013-01-19 ENCOUNTER — Other Ambulatory Visit (HOSPITAL_COMMUNITY): Payer: Self-pay | Admitting: Interventional Radiology

## 2013-01-19 ENCOUNTER — Ambulatory Visit (HOSPITAL_COMMUNITY)
Admission: RE | Admit: 2013-01-19 | Discharge: 2013-01-19 | Disposition: A | Discharge status: Home / Self Care | Payer: Medicare Other | Source: Ambulatory Visit | Attending: Interventional Radiology | Admitting: Interventional Radiology

## 2013-01-19 VITALS — BP 112/45 | HR 77 | Temp 96.0°F | Resp 18 | Ht 66.0 in | Wt 122.0 lb

## 2013-01-19 DIAGNOSIS — I774 Celiac artery compression syndrome: Secondary | ICD-10-CM | POA: Insufficient documentation

## 2013-01-19 DIAGNOSIS — R634 Abnormal weight loss: Secondary | ICD-10-CM

## 2013-01-19 DIAGNOSIS — I749 Embolism and thrombosis of unspecified artery: Secondary | ICD-10-CM

## 2013-01-19 DIAGNOSIS — I10 Essential (primary) hypertension: Secondary | ICD-10-CM

## 2013-01-19 DIAGNOSIS — I700 Atherosclerosis of aorta: Secondary | ICD-10-CM | POA: Diagnosis not present

## 2013-01-19 DIAGNOSIS — Z8679 Personal history of other diseases of the circulatory system: Secondary | ICD-10-CM

## 2013-01-19 DIAGNOSIS — K551 Chronic vascular disorders of intestine: Secondary | ICD-10-CM | POA: Diagnosis not present

## 2013-01-19 DIAGNOSIS — R109 Unspecified abdominal pain: Secondary | ICD-10-CM

## 2013-01-19 DIAGNOSIS — K559 Vascular disorder of intestine, unspecified: Secondary | ICD-10-CM

## 2013-01-19 DIAGNOSIS — E785 Hyperlipidemia, unspecified: Secondary | ICD-10-CM

## 2013-01-19 DIAGNOSIS — R269 Unspecified abnormalities of gait and mobility: Secondary | ICD-10-CM

## 2013-01-19 DIAGNOSIS — F419 Anxiety disorder, unspecified: Secondary | ICD-10-CM

## 2013-01-19 DIAGNOSIS — K8020 Calculus of gallbladder without cholecystitis without obstruction: Secondary | ICD-10-CM

## 2013-01-19 DIAGNOSIS — M1710 Unilateral primary osteoarthritis, unspecified knee: Secondary | ICD-10-CM

## 2013-01-19 DIAGNOSIS — R609 Edema, unspecified: Secondary | ICD-10-CM

## 2013-01-19 DIAGNOSIS — N838 Other noninflammatory disorders of ovary, fallopian tube and broad ligament: Secondary | ICD-10-CM

## 2013-01-19 DIAGNOSIS — I509 Heart failure, unspecified: Secondary | ICD-10-CM

## 2013-01-19 DIAGNOSIS — I2510 Atherosclerotic heart disease of native coronary artery without angina pectoris: Secondary | ICD-10-CM

## 2013-01-19 LAB — BASIC METABOLIC PANEL
BUN: 10 mg/dL (ref 6–23)
Chloride: 102 mEq/L (ref 96–112)
Creatinine, Ser: 0.73 mg/dL (ref 0.50–1.10)
GFR calc non Af Amer: 76 mL/min — ABNORMAL LOW (ref >90)
Glucose, Bld: 150 mg/dL — ABNORMAL HIGH (ref 70–99)
Potassium: 3.1 mEq/L — ABNORMAL LOW (ref 3.5–5.1)

## 2013-01-19 LAB — CBC
Hemoglobin: 11.5 g/dL — ABNORMAL LOW (ref 12.0–15.0)
MCHC: 33.7 g/dL (ref 30.0–36.0)
RBC: 3.9 MIL/uL (ref 3.87–5.11)
WBC: 16.5 10*3/uL — ABNORMAL HIGH (ref 4.0–10.5)

## 2013-01-19 LAB — COMPREHENSIVE METABOLIC PANEL
ALT: 18 U/L (ref 0–35)
Alkaline Phosphatase: 94 U/L (ref 39–117)
Chloride: 100 mEq/L (ref 96–112)
GFR calc Af Amer: >90 mL/min (ref >90)
Glucose, Bld: 150 mg/dL — ABNORMAL HIGH (ref 70–99)
Potassium: 2.6 mEq/L — CL (ref 3.5–5.1)
Sodium: 140 mEq/L (ref 135–145)
Total Bilirubin: 0.5 mg/dL (ref 0.3–1.2)
Total Protein: 6.2 g/dL (ref 6.0–8.3)

## 2013-01-19 LAB — APTT: aPTT: 26 seconds (ref 24–37)

## 2013-01-19 MED ORDER — POTASSIUM CHLORIDE 10 MEQ/100ML IV SOLN: 10.0000 meq | INTRAVENOUS | Status: DC

## 2013-01-19 MED ORDER — HYDROCODONE-ACETAMINOPHEN 5-325 MG PO TABS
1.0000 | ORAL_TABLET | ORAL | Status: DC | PRN
  Administered 2013-01-19: 1 via ORAL

## 2013-01-19 MED ORDER — SODIUM CHLORIDE 0.9 % IV SOLN: INTRAVENOUS | Status: DC

## 2013-01-19 MED ORDER — HEPARIN SODIUM (PORCINE) 1000 UNIT/ML IJ SOLN
INTRAMUSCULAR | Status: AC
  Administered 2013-01-19: 3000 [IU]
  Filled 2013-01-19: qty 1

## 2013-01-19 MED ORDER — IOHEXOL 300 MG/ML  SOLN
150.0000 mL | Freq: Once | INTRAMUSCULAR | Status: AC | PRN
  Administered 2013-01-19: 90 mL via INTRAVENOUS

## 2013-01-19 MED ORDER — MIDAZOLAM HCL 2 MG/2ML IJ SOLN
INTRAMUSCULAR | Status: DC | PRN
  Administered 2013-01-19: 1 mg via INTRAVENOUS

## 2013-01-19 MED ORDER — FENTANYL CITRATE 0.05 MG/ML IJ SOLN
INTRAMUSCULAR | Status: DC | PRN
  Administered 2013-01-19 (×2): 25 ug via INTRAVENOUS

## 2013-01-19 MED ORDER — CLOPIDOGREL BISULFATE 75 MG PO TABS: 75.0000 mg | ORAL_TABLET | Freq: Every day | ORAL | Status: DC

## 2013-01-19 MED ORDER — FENTANYL CITRATE 0.05 MG/ML IJ SOLN
INTRAMUSCULAR | Status: AC
  Filled 2013-01-19: qty 2

## 2013-01-19 MED ORDER — POTASSIUM CHLORIDE 10 MEQ/100ML IV SOLN
10.0000 meq | INTRAVENOUS | Status: AC
  Administered 2013-01-19 (×3): 10 meq via INTRAVENOUS
  Filled 2013-01-19 (×4): qty 100

## 2013-01-19 MED ORDER — MIDAZOLAM HCL 2 MG/2ML IJ SOLN
INTRAMUSCULAR | Status: AC
  Filled 2013-01-19: qty 2

## 2013-01-19 MED ORDER — HYDROCODONE-ACETAMINOPHEN 5-325 MG PO TABS
ORAL_TABLET | ORAL | Status: AC
  Filled 2013-01-19: qty 1
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
## 2013-01-19 NOTE — Procedures (Signed)
Mesenteric arteriogram Stent-assisted angioplasty SMA and Celiac axis No complication No blood loss. See complete dictation in [facility].

## 2013-01-20 ENCOUNTER — Other Ambulatory Visit: Payer: Self-pay | Admitting: Geriatric Medicine

## 2013-01-20 ENCOUNTER — Inpatient Hospital Stay
Admission: RE | Admit: 2013-01-20 | Discharge: 2013-01-20 | Disposition: A | Discharge status: Home / Self Care | Payer: Medicare Other | Source: Ambulatory Visit | Attending: Gastroenterology | Admitting: Gastroenterology

## 2013-01-20 ENCOUNTER — Encounter: Payer: Self-pay | Admitting: Geriatric Medicine

## 2013-01-20 ENCOUNTER — Non-Acute Institutional Stay (SKILLED_NURSING_FACILITY): Payer: Medicare Other | Admitting: Geriatric Medicine

## 2013-01-20 DIAGNOSIS — Z66 Do not resuscitate: Secondary | ICD-10-CM | POA: Diagnosis not present

## 2013-01-20 DIAGNOSIS — D72829 Elevated white blood cell count, unspecified: Secondary | ICD-10-CM | POA: Insufficient documentation

## 2013-01-20 DIAGNOSIS — E876 Hypokalemia: Secondary | ICD-10-CM | POA: Diagnosis not present

## 2013-01-20 DIAGNOSIS — K559 Vascular disorder of intestine, unspecified: Secondary | ICD-10-CM

## 2013-01-20 MED ORDER — PREGABALIN 50 MG PO CAPS: 50.0000 mg | ORAL_CAPSULE | Freq: Every day | ORAL | Status: DC

## 2013-01-20 MED ORDER — DIGOXIN 125 MCG PO TABS: 125.0000 ug | ORAL_TABLET | Freq: Every day | ORAL | Status: DC
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
## 2013-01-20 NOTE — Patient Instructions (Addendum)
  Medication List       These changes are accurate as of: 01/20/2013 11:49 AM. If you have any questions, ask your nurse or doctor.          STOP taking these medications       BANOPHEN 50 MG capsule  Generic drug:  diphenhydrAMINE     HYDROmorphone 2 MG tablet  Commonly known as:  DILAUDID     predniSONE 50 MG tablet  Commonly known as:  DELTASONE      TAKE these medications       ALPRAZolam 0.5 MG tablet  Commonly known as:  XANAX  Take 0.25 mg by mouth 2 (two) times daily. Morning and bedtime     aspirin 81 MG tablet  Take 81 mg by mouth daily.     buPROPion 300 MG 24 hr tablet  Commonly known as:  WELLBUTRIN XL  Take 300 mg by mouth daily.     carvedilol 12.5 MG tablet  Commonly known as:  COREG  Take 12.5 mg by mouth 2 (two) times daily with a meal.     digoxin 0.25 MG tablet  Commonly known as:  LANOXIN  Take 125 mcg by mouth daily.     furosemide 40 MG tablet  Commonly known as:  LASIX  Take 20 mg by mouth daily.     HYDROcodone-acetaminophen 10-650 MG per tablet  Commonly known as:  LORCET  Take 0.5 tablets by mouth 2 (two) times daily. For pain management     levothyroxine 75 MCG tablet  Commonly known as:  SYNTHROID, LEVOTHROID  Take 75 mcg by mouth daily.     losartan 50 MG tablet  Commonly known as:  COZAAR  Take 50 mg by mouth daily.     LYRICA 50 MG capsule  Generic drug:  pregabalin  Take 1 capsule by mouth daily. To reduce neuropathic pain     nitroGLYCERIN 0.4 MG SL tablet  Commonly known as:  NITROSTAT  Place 0.4 mg under the tongue every 5 (five) minutes as needed for chest pain.     polyethylene glycol powder powder  Commonly known as:  GLYCOLAX/MIRALAX  Take 17 g by mouth daily as needed. For constipation. Mix with 6oz. Beverage of choice     ursodiol 300 MG capsule  Commonly known as:  ACTIGALL  Take 300 mg by mouth 2 (two) times daily.

## 2013-01-20 NOTE — Assessment & Plan Note (Signed)
Elevated WBC pre-procedure yesterday. No obvious sign of infection, possibly related to stress of mesenteric ischemia? Repeat CBC today

## 2013-01-20 NOTE — Assessment & Plan Note (Addendum)
Pt underwent successful Mesenteric arteriogram and Stent-assisted angioplasty SMA and Celiac axis yesterday. Reportedly had immediate relief from symptoms; ate dinner last evening without abdominal pain, nausea or diarrhea. Rt.femoral cath site stable; no hematoma, non tender.  [place], follow d/c instructions form hospital re: femoral cath site. F/u with Dr.Buccini as scheduled later this week.

## 2013-01-20 NOTE — Assessment & Plan Note (Signed)
Hypokalemia improved post procedure. Will recheck BMP today. Reduce diuretic dose.

## 2013-01-21 ENCOUNTER — Other Ambulatory Visit: Payer: Self-pay

## 2013-01-21 ENCOUNTER — Telehealth: Payer: Self-pay

## 2013-01-21 DIAGNOSIS — E876 Hypokalemia: Secondary | ICD-10-CM

## 2013-01-21 MED ORDER — POTASSIUM CHLORIDE CRYS ER 20 MEQ PO TBCR: 20.0000 meq | EXTENDED_RELEASE_TABLET | Freq: Every day | ORAL | Status: DC

## 2013-01-21 NOTE — Telephone Encounter (Signed)
Left message for patient to call me back about lab results.

## 2013-01-22 ENCOUNTER — Telehealth: Payer: Self-pay | Admitting: Emergency Medicine

## 2013-01-22 ENCOUNTER — Telehealth: Payer: Self-pay

## 2013-01-22 DIAGNOSIS — K551 Chronic vascular disorders of intestine: Secondary | ICD-10-CM | POA: Diagnosis not present

## 2013-01-22 DIAGNOSIS — K591 Functional diarrhea: Secondary | ICD-10-CM | POA: Diagnosis not present

## 2013-01-22 DIAGNOSIS — E876 Hypokalemia: Secondary | ICD-10-CM

## 2013-01-22 DIAGNOSIS — R634 Abnormal weight loss: Secondary | ICD-10-CM | POA: Diagnosis not present

## 2013-01-22 DIAGNOSIS — K8020 Calculus of gallbladder without cholecystitis without obstruction: Secondary | ICD-10-CM | POA: Diagnosis not present

## 2013-01-22 HISTORY — DX: Hypokalemia: E87.6

## 2013-01-22 MED ORDER — POTASSIUM CHLORIDE CRYS ER 20 MEQ PO TBCR: 20.0000 meq | EXTENDED_RELEASE_TABLET | Freq: Every day | ORAL | Status: DC

## 2013-01-22 NOTE — Telephone Encounter (Signed)
Called patient per Debra Marion, NP,  today about her lab (Potassium level) 3.2. Wants her start [employer] one daily, repeat lab 01/28/12. Fax Rx to [facility]. Fax copy of lab to Dr. Matthias Hughs (has appt today).

## 2013-01-22 NOTE — Telephone Encounter (Signed)
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  Called Dr Deanne Coffer and relayed the message!

## 2013-01-27 ENCOUNTER — Other Ambulatory Visit: Payer: Self-pay | Admitting: Geriatric Medicine

## 2013-01-27 DIAGNOSIS — E876 Hypokalemia: Secondary | ICD-10-CM | POA: Diagnosis not present

## 2013-01-27 DIAGNOSIS — K219 Gastro-esophageal reflux disease without esophagitis: Secondary | ICD-10-CM

## 2013-01-27 DIAGNOSIS — R609 Edema, unspecified: Secondary | ICD-10-CM

## 2013-01-27 DIAGNOSIS — K551 Chronic vascular disorders of intestine: Secondary | ICD-10-CM

## 2013-01-27 MED ORDER — FUROSEMIDE 40 MG PO TABS: 40.0000 mg | ORAL_TABLET | Freq: Every day | ORAL | Status: DC

## 2013-01-27 MED ORDER — PANTOPRAZOLE SODIUM 40 MG PO TBEC: 40.0000 mg | DELAYED_RELEASE_TABLET | Freq: Every day | ORAL | Status: DC

## 2013-01-27 MED ORDER — CLOPIDOGREL BISULFATE 75 MG PO TABS: 75.0000 mg | ORAL_TABLET | Freq: Every day | ORAL | Status: DC

## 2013-01-28 ENCOUNTER — Encounter (INDEPENDENT_AMBULATORY_CARE_PROVIDER_SITE_OTHER): Payer: Self-pay

## 2013-02-06 ENCOUNTER — Other Ambulatory Visit: Payer: Self-pay | Admitting: *Deleted

## 2013-02-06 DIAGNOSIS — E876 Hypokalemia: Secondary | ICD-10-CM

## 2013-02-06 MED ORDER — POTASSIUM CHLORIDE CRYS ER 20 MEQ PO TBCR: EXTENDED_RELEASE_TABLET | ORAL | Status: DC

## 2013-02-09 ENCOUNTER — Non-Acute Institutional Stay: Payer: Medicare Other | Admitting: Internal Medicine

## 2013-02-09 ENCOUNTER — Encounter: Payer: Self-pay | Admitting: Internal Medicine

## 2013-02-09 VITALS — BP 118/60 | HR 92

## 2013-02-09 DIAGNOSIS — K559 Vascular disorder of intestine, unspecified: Secondary | ICD-10-CM

## 2013-02-09 DIAGNOSIS — I10 Essential (primary) hypertension: Secondary | ICD-10-CM

## 2013-02-09 DIAGNOSIS — R609 Edema, unspecified: Secondary | ICD-10-CM

## 2013-02-09 DIAGNOSIS — K219 Gastro-esophageal reflux disease without esophagitis: Secondary | ICD-10-CM

## 2013-02-10 ENCOUNTER — Encounter: Payer: Self-pay | Admitting: Internal Medicine

## 2013-02-10 NOTE — Progress Notes (Signed)
  Subjective:    Patient ID: Debra Brooks, female    DOB: 1924/12/19, 79 y.o.   MRN: [id number]  HPI Patient recently underwent a procedure to dilate the superior mesenteric artery for vascular insufficiency. Her chronic nausea and abdominal discomfort have improved dramatically since his procedure was signed. She says she began feeling better within an hour after the procedure.  Patient was seen for bronchitis 2 months ago. This has resolved.   Review of Systems    GENERAL APPEARANCE: No acute distress, appropriately groomed, normal body habitus. Alert, pleasant, conversant. HEAD: Normocephalic, atraumatic EYES: Conjunctiva/lids clear. Pupils round, reactive. EOMs intact.   EARS: External exam WNL. Hearing grossly normal. NOSE: No deformity or discharge. MOUTH/THROAT: Lips w/o lesions. Oral mucosa, tongue moist, w/o lesion. Oropharynx w/o redness or lesions.   NECK: Supple, full ROM. No thyroid tenderness, enlargement or nodule LYMPHATICS: No head, neck or supraclavicular adenopathy RESPIRATORY: Breathing is even, unlabored. Lung sounds are clear and full.   CARDIOVASCULAR: Heart RRR. No murmur or extra heart sounds                         VENOUS: Bilateral varicosities. No venous stasis skin changes             EDEMA: No LE edema. GASTROINTESTINAL: Abdomen is soft, nontender, not distended w/ normal bowel sounds.           GENITOURINARY: Bladder non tender, not distended. MUSCULOSKELETAL: Moves all extremities with full ROM, strength and tone. Back is without kyphosis, scoliosis or spinal process tenderness. Unstable gait. Using walker. NEUROLOGIC: Oriented to time, place, person. Speech clear, no tremor.  PSYCHIATRIC: Mood and affect appropriate to situation  Objective:   Physical Exam  DATA OBTAINED: from patient, medical record. GENERAL:Fells very well!Marland Kitchen No fevers.  Is hungry! SKIN: No itch, rash or open wounds EYES: No eye pain, dryness or itching. No change in vision EARS:  No earache, tinnitus, change in hearing NOSE: No congestion, drainage or bleeding MOUTH/THROAT: No mouth or tooth pain. No difficulty chewing or swallowing. RESPIRATORY: No cough, wheezing, SOB CARDIAC: No chest pain, palpitations. No edema. GI:  No abdominal pain, N/D. No heartburn or reflux.   GU: No dysuria, frequency or urgency.  No change in urine volume or character MUSCULOSKELETAL: No joint pain, swelling or stiffness. No back pain. No muscle ache, pain, weakness. Gait is unstable. No recent falls. Using walker.   NEUROLOGIC: No  fainting, headache, imbalance, numbness. No change in mental status.   PSYCHIATRIC: No anxiety. Sleeps well       Assessment & Plan:   Patient Active Problem List  Diagnosis  . DEGENERATIVE JOINT DISEASE, KNEES, BILATERAL    Contributes to gait instability   . UNSTEADY GAIT    Advised to continue using walker   . CAD (coronary artery disease)    No recent angina   . S/P ablation of atrial flutter  . Hypertension    Currently running on the low side   . CHF (congestive heart failure)    Stable. No change in medications   . Chronic kidney disease (CKD), stage III (moderate)    Continue to follow with lab. Currently stable.   . Mesenteric ischemia    Improved with recent stenting. Patient advised to continue with Plavix.

## 2013-02-10 NOTE — Patient Instructions (Signed)
Continue current medications. 

## 2013-02-19 ENCOUNTER — Other Ambulatory Visit: Payer: Self-pay | Admitting: *Deleted

## 2013-02-19 MED ORDER — PREGABALIN 50 MG PO CAPS: 50.0000 mg | ORAL_CAPSULE | Freq: Every day | ORAL | Status: DC

## 2013-02-24 ENCOUNTER — Telehealth: Payer: Self-pay | Admitting: Cardiology

## 2013-02-24 DIAGNOSIS — H3530 Unspecified macular degeneration: Secondary | ICD-10-CM | POA: Diagnosis not present

## 2013-02-24 DIAGNOSIS — H35369 Drusen (degenerative) of macula, unspecified eye: Secondary | ICD-10-CM | POA: Diagnosis not present

## 2013-02-24 NOTE — Telephone Encounter (Signed)
New problem   Pt have a question about her carotid she is needing to have because the recall stated January. Please call pt.

## 2013-02-24 NOTE — Telephone Encounter (Signed)
Spoke with patient. Pt states she is not having any problems at the present time. Pt does need to schedule follow up appt with Dr Shirlee Latch and carotid dopplers.  She would like to do these on the same day. She is aware that we will schedule first available right now but she can be placed on wait list if cancellation. She will call back if she develops symptoms and needs sooner appt. She is also willing to see Dawayne Patricia. Please call to arrange these appts for her.

## 2013-02-25 ENCOUNTER — Encounter: Payer: Self-pay | Admitting: Internal Medicine

## 2013-03-04 ENCOUNTER — Ambulatory Visit
Admission: RE | Admit: 2013-03-04 | Discharge: 2013-03-04 | Disposition: A | Discharge status: Home / Self Care | Payer: Medicare Other | Source: Ambulatory Visit | Attending: Radiology | Admitting: Radiology

## 2013-03-04 VITALS — BP 106/46 | HR 86 | Temp 97.6°F | Resp 12

## 2013-03-04 DIAGNOSIS — M1710 Unilateral primary osteoarthritis, unspecified knee: Secondary | ICD-10-CM

## 2013-03-04 DIAGNOSIS — R609 Edema, unspecified: Secondary | ICD-10-CM

## 2013-03-04 DIAGNOSIS — K559 Vascular disorder of intestine, unspecified: Secondary | ICD-10-CM

## 2013-03-04 DIAGNOSIS — N838 Other noninflammatory disorders of ovary, fallopian tube and broad ligament: Secondary | ICD-10-CM

## 2013-03-04 DIAGNOSIS — I749 Embolism and thrombosis of unspecified artery: Secondary | ICD-10-CM

## 2013-03-04 DIAGNOSIS — I509 Heart failure, unspecified: Secondary | ICD-10-CM

## 2013-03-04 DIAGNOSIS — R269 Unspecified abnormalities of gait and mobility: Secondary | ICD-10-CM

## 2013-03-04 DIAGNOSIS — R634 Abnormal weight loss: Secondary | ICD-10-CM

## 2013-03-04 DIAGNOSIS — E785 Hyperlipidemia, unspecified: Secondary | ICD-10-CM

## 2013-03-04 DIAGNOSIS — F419 Anxiety disorder, unspecified: Secondary | ICD-10-CM

## 2013-03-04 DIAGNOSIS — I10 Essential (primary) hypertension: Secondary | ICD-10-CM

## 2013-03-04 DIAGNOSIS — Z8679 Personal history of other diseases of the circulatory system: Secondary | ICD-10-CM

## 2013-03-04 DIAGNOSIS — K8020 Calculus of gallbladder without cholecystitis without obstruction: Secondary | ICD-10-CM

## 2013-03-04 DIAGNOSIS — I748 Embolism and thrombosis of other arteries: Secondary | ICD-10-CM | POA: Diagnosis not present

## 2013-03-04 DIAGNOSIS — I2510 Atherosclerotic heart disease of native coronary artery without angina pectoris: Secondary | ICD-10-CM

## 2013-03-04 DIAGNOSIS — R109 Unspecified abdominal pain: Secondary | ICD-10-CM

## 2013-03-04 DIAGNOSIS — Z09 Encounter for follow-up examination after completed treatment for conditions other than malignant neoplasm: Secondary | ICD-10-CM | POA: Diagnosis not present

## 2013-03-04 NOTE — Progress Notes (Signed)
APPETITE BETTER, NO COMPLAINTS POST  STENTING PROCEDURE.  DESCRIBES HAS BEEN GREAT POST PROCEDURE.

## 2013-03-05 ENCOUNTER — Other Ambulatory Visit: Payer: Medicare Other

## 2013-03-05 ENCOUNTER — Other Ambulatory Visit: Payer: Self-pay | Admitting: Geriatric Medicine

## 2013-03-05 MED ORDER — POTASSIUM CHLORIDE CRYS ER 20 MEQ PO TBCR: 20.0000 meq | EXTENDED_RELEASE_TABLET | Freq: Two times a day (BID) | ORAL | Status: DC

## 2013-03-10 DIAGNOSIS — L578 Other skin changes due to chronic exposure to nonionizing radiation: Secondary | ICD-10-CM | POA: Diagnosis not present

## 2013-03-10 DIAGNOSIS — L820 Inflamed seborrheic keratosis: Secondary | ICD-10-CM | POA: Diagnosis not present

## 2013-03-10 DIAGNOSIS — I509 Heart failure, unspecified: Secondary | ICD-10-CM | POA: Diagnosis not present

## 2013-03-10 DIAGNOSIS — L819 Disorder of pigmentation, unspecified: Secondary | ICD-10-CM | POA: Diagnosis not present

## 2013-03-13 ENCOUNTER — Other Ambulatory Visit: Payer: Self-pay | Admitting: *Deleted

## 2013-03-13 MED ORDER — PREGABALIN 50 MG PO CAPS: 50.0000 mg | ORAL_CAPSULE | Freq: Every day | ORAL | Status: DC

## 2013-03-17 ENCOUNTER — Other Ambulatory Visit: Payer: Self-pay | Admitting: Gastroenterology

## 2013-03-17 DIAGNOSIS — K8020 Calculus of gallbladder without cholecystitis without obstruction: Secondary | ICD-10-CM

## 2013-03-17 DIAGNOSIS — R634 Abnormal weight loss: Secondary | ICD-10-CM

## 2013-03-24 ENCOUNTER — Ambulatory Visit
Admission: RE | Admit: 2013-03-24 | Discharge: 2013-03-24 | Disposition: A | Discharge status: Home / Self Care | Payer: Medicare Other | Source: Ambulatory Visit | Attending: Gastroenterology | Admitting: Gastroenterology

## 2013-03-24 ENCOUNTER — Other Ambulatory Visit: Payer: Medicare Other

## 2013-03-24 DIAGNOSIS — R634 Abnormal weight loss: Secondary | ICD-10-CM

## 2013-03-24 DIAGNOSIS — K8020 Calculus of gallbladder without cholecystitis without obstruction: Secondary | ICD-10-CM

## 2013-03-25 ENCOUNTER — Encounter: Payer: Self-pay | Admitting: Geriatric Medicine

## 2013-03-25 ENCOUNTER — Non-Acute Institutional Stay: Payer: Medicare Other | Admitting: Geriatric Medicine

## 2013-03-25 VITALS — BP 100/58 | HR 60 | Ht 66.0 in | Wt 143.0 lb

## 2013-03-25 DIAGNOSIS — F5089 Other specified eating disorder: Secondary | ICD-10-CM | POA: Diagnosis not present

## 2013-03-25 DIAGNOSIS — E785 Hyperlipidemia, unspecified: Secondary | ICD-10-CM | POA: Diagnosis not present

## 2013-03-25 DIAGNOSIS — K559 Vascular disorder of intestine, unspecified: Secondary | ICD-10-CM | POA: Diagnosis not present

## 2013-03-25 DIAGNOSIS — R609 Edema, unspecified: Secondary | ICD-10-CM

## 2013-03-25 DIAGNOSIS — K8020 Calculus of gallbladder without cholecystitis without obstruction: Secondary | ICD-10-CM

## 2013-03-25 DIAGNOSIS — I10 Essential (primary) hypertension: Secondary | ICD-10-CM

## 2013-03-25 NOTE — Progress Notes (Signed)
Patient ID: Debra Brooks, female   DOB: 1924/11/18, 79 y.o.   MRN: [id number] [facility] [phone])  Chief Complaint  Patient presents with  . Medical Managment of Chronic Issues    blood pressure, hpokalemia, patient c/o swelling in feet and legs    [id number]:[id number] is a 79 y.o. female resident of [facility], Independent Living section evaluated today for management of ongoing medical issues.  Patient reports she has been feeling very well since her mesenteric stent placement. She has a very good appetite and has returned to her usual social activities. She has noted increased lower extremity edema however no shortness of breath. She did have some hypokalemia back a couple months ago most recent potassium was 5.0, the supplement was stopped. This patient had an abdominal ultrasound yesterday in followup of her known gallstones. Gallstones are still present however no sign of cholecystitis. Patient's only complaint is that she has noticed a craving for ice chips, she wonders if she is a little anemic.   Allergies  Allergies  Allergen Reactions  . Iodinated Diagnostic Agents    Medications  Current outpatient prescriptions:ALPRAZolam (XANAX) 0.5 MG tablet, Take 0.25 mg by mouth 2 (two) times daily. Morning and bedtime, Disp: , Rfl: ;  aspirin 81 MG tablet, Take 81 mg by mouth daily.  , Disp: , Rfl: ;  buPROPion (WELLBUTRIN XL) 300 MG 24 hr tablet, Take 300 mg by mouth daily.  , Disp: , Rfl: ;  carvedilol (COREG) 12.5 MG tablet, Take 12.5 mg by mouth 2 (two) times daily with a meal., Disp: , Rfl:  clopidogrel (PLAVIX) 75 MG tablet, Take 1 tablet (75 mg total) by mouth daily., Disp: 90 tablet, Rfl: 3;  digoxin (LANOXIN) 0.125 MG tablet, Take 1 tablet (125 mcg total) by mouth daily., Disp: 30 tablet, Rfl: 3;  furosemide (LASIX) 40 MG tablet, Take 1 tablet (40 mg total) by mouth daily., Disp: 30 tablet, Rfl: 11 HYDROcodone-acetaminophen (LORCET) 10-650 MG per tablet,  Take 0.5 tablets by mouth 2 (two) times daily. For pain management, Disp: , Rfl: ;  levothyroxine (SYNTHROID, LEVOTHROID) 75 MCG tablet, Take 75 mcg by mouth daily.  , Disp: , Rfl: ;  losartan (COZAAR) 50 MG tablet, Take 50 mg by mouth daily., Disp: , Rfl: ;  Multiple Vitamins-Minerals (ICAPS PO), Take 1 capsule by mouth. Daily, Disp: , Rfl:  nitroGLYCERIN (NITROSTAT) 0.4 MG SL tablet, Place 0.4 mg under the tongue every 5 (five) minutes as needed for chest pain. , Disp: , Rfl: ;  pantoprazole (PROTONIX) 40 MG tablet, Take 1 tablet (40 mg total) by mouth daily., Disp: 30 tablet, Rfl: 3;  polyethylene glycol powder (GLYCOLAX/MIRALAX) powder, Take 17 g by mouth daily as needed. For constipation. Mix with 6oz. Beverage of choice, Disp: , Rfl:  pregabalin (LYRICA) 50 MG capsule, Take 1 capsule (50 mg total) by mouth daily. To reduce neuropathic pain, Disp: 30 capsule, Rfl: 0;  ursodiol (ACTIGALL) 300 MG capsule, Take 300 mg by mouth 2 (two) times daily., Disp: , Rfl:   Data Reviewed    Radiology: reviewed 03/24/13 Abdominal U/S    NWG:NFAOZHY external results  06/17/2012 CBC: Wbc 8.3, Rbc 3.84, Hgb 12.0, Hct 35.2   CMP: Sodium 143, Potassium 4.0, glucose 86, BUN 24, Creatinine 1.29   Total Protein 5.8   Lipid: cholesterol 125, triglyceride 146, HDL 47, LDL 49   TSH 3.840  09/16/12 Glu 76, BUN 22, Cr. 1.23, Na 1442, K+ 3.7   TC 159, TG  101, HDL 44, LDL 95  01/16/2013: WBC 13.2, hemoglobin 10.2, hematocrit 30.8, platelets   Glucose 93, BUN 15, creatinine 0.91, sodium 135, potassium 5.0. Albumin 3.2 Hosp Lab:   01/19/13  0717  01/19/13  0834   K  2.6*  3.1*    03/10/2013:  glucose 87, BUN 20, creatinine 1.16,  sodium 139, potassium 5.0  Review of Systems  DATA OBTAINED: from patient GENERAL: Feels well No fevers, fatigue, improved appetite and weight SKIN: No itch, rash or open wounds EYES: No eye pain, dryness or itching  No change in vision EARS: No earache, tinnitus, change in hearing NOSE:  No congestion, drainage or bleeding MOUTH/THROAT: No mouth or tooth pain  No difficulty chewing or swallowing RESPIRATORY: No cough, wheezing, SOB CARDIAC: No chest pain, palpitations  LE edema returning. GI: No abdominal pain  No N/V/D or constipation  No heartburn or reflux  GU: No dysuria, frequency or urgency  No change in urine volume or character  MUSCULOSKELETAL: No joint pain, swelling or stiffness  No back pain  No muscle ache, pain, weakness  Gait is steady  No recent falls.  NEUROLOGIC: No dizziness, fainting, headache,   No change in mental status.  PSYCHIATRIC: No feelings of anxiety, depression Sleeps well.     Physical Exam Filed Vitals:   03/25/13 0947  BP: 100/58  Pulse: 60  Height: 5\' 6"  (1.676 m)  Weight: 143 lb (64.864 kg)   Body mass index is 23.09 kg/(m^2).  GENERAL APPEARANCE: No acute distress, appropriately groomed, normal body habitus. Alert, pleasant, conversant. HEAD: Normocephalic, atraumatic EYES: Conjunctiva/lids clear. Pupils round, reactive. EOMs intact.  EARS:  Hearing grossly normal. NOSE: No deformity or discharge. MOUTH/THROAT: Lips w/o lesions. Oral mucosa, tongue moist, w/o lesion. Oropharynx w/o redness or lesions.  NECK: Supple, full ROM. No thyroid tenderness, enlargement or nodule LYMPHATICS: No head, neck or supraclavicular adenopathy RESPIRATORY: Breathing is even, unlabored. Lung sounds are clear and full.  CARDIOVASCULAR: Heart RRR. No murmur or extra heart sounds  VENOUS: Bilateral LE varicosities, mild  venous stasis skin changes  EDEMA: 1+ Bilateral LE edema, non pitting GASTROINTESTINAL: Abdomen is soft, non-tender, not distended w/ normal bowel sounds.  MUSCULOSKELETAL: Moves all extremities with full ROM, strength and tone. Back is without kyphosis, scoliosis or spinal process tenderness. Gait is steady NEUROLOGIC: Oriented to time, place, person. Speech clear, no tremor.  PSYCHIATRIC: Mood and affect appropriate to  situation  ASSESSMENT/PLAN  Pica in adults New, recent craving for ice chips.  Possible sign of anemia- check lab  Mesenteric ischemia Patient is asymptomatic since stenting of her mesenteric artery. She has followed up with Dr. Jeannetta Ellis and he satisfied with the results. Patient's weight gain is related to recovery from malnutrition related to this problem.  Hypertension Blood pressure stable, continue current medications, check laboratory studies  Hyperlipemia Statin medicines were stopped several months ago, recheck lipid status.  Cholelithiasis Cholelithiasis, no cholecystitis. Continue ursodiol. Follow up with surgery should gallbladder symptoms return.  Edema Chronic lower extremity edema was much improved when this patient was very ill, not eating and had significant weight loss. She now has return of the edema, will  increase Lasix to twice a day. Check BMP next week    Follow up: 2 months  Irania Durell T.Jourdan Maldonado, NP-C 03/25/2013

## 2013-03-31 ENCOUNTER — Encounter: Payer: Self-pay | Admitting: Geriatric Medicine

## 2013-03-31 ENCOUNTER — Non-Acute Institutional Stay: Payer: Medicare Other | Admitting: Geriatric Medicine

## 2013-03-31 DIAGNOSIS — D509 Iron deficiency anemia, unspecified: Secondary | ICD-10-CM

## 2013-03-31 DIAGNOSIS — I10 Essential (primary) hypertension: Secondary | ICD-10-CM | POA: Diagnosis not present

## 2013-03-31 DIAGNOSIS — Z79899 Other long term (current) drug therapy: Secondary | ICD-10-CM | POA: Diagnosis not present

## 2013-03-31 DIAGNOSIS — N183 Chronic kidney disease, stage 3 unspecified: Secondary | ICD-10-CM | POA: Diagnosis not present

## 2013-03-31 DIAGNOSIS — E785 Hyperlipidemia, unspecified: Secondary | ICD-10-CM | POA: Diagnosis not present

## 2013-03-31 DIAGNOSIS — D582 Other hemoglobinopathies: Secondary | ICD-10-CM | POA: Diagnosis not present

## 2013-03-31 DIAGNOSIS — K559 Vascular disorder of intestine, unspecified: Secondary | ICD-10-CM | POA: Diagnosis not present

## 2013-03-31 DIAGNOSIS — I259 Chronic ischemic heart disease, unspecified: Secondary | ICD-10-CM | POA: Diagnosis not present

## 2013-03-31 HISTORY — DX: Iron deficiency anemia, unspecified: D50.9

## 2013-03-31 NOTE — Assessment & Plan Note (Signed)
Cholelithiasis, no cholecystitis. Continue ursodiol. Follow up with surgery should gallbladder symptoms return.

## 2013-03-31 NOTE — Assessment & Plan Note (Signed)
New, recent craving for ice chips.  Possible sign of anemia- check lab

## 2013-03-31 NOTE — Progress Notes (Signed)
Patient ID: Debra Brooks, female   DOB: 09/30/1925, 79 y.o.   MRN: [id number] [facility] [id number])  Chief Complaint  Patient presents with  . Anemia    HPI: This 79 year old female resident of [facility] section returns to clinic today at my request to discuss her lab results. CBC was returned this afternoon with a hemoglobin of 6.8. This is markedly decreased from her most recent level of 10.5. At last visit patient reported she had a craving for ice chips, denied any signs of obvious bleeding. Today she reports she continues to feel well, appetite is good, no ignificant fatigue, no bleeding.   Allergies  Allergies  Allergen Reactions  . Iodinated Diagnostic Agents    Medications Current outpatient prescriptions:ALPRAZolam (XANAX) 0.5 MG tablet, Take 0.25 mg by mouth 2 (two) times daily. Morning and bedtime, Disp: , Rfl: ;  aspirin 81 MG tablet, Take 81 mg by mouth daily.  , Disp: , Rfl: ;  buPROPion (WELLBUTRIN XL) 300 MG 24 hr tablet, Take 300 mg by mouth daily.  , Disp: , Rfl: ;  carvedilol (COREG) 12.5 MG tablet, Take 12.5 mg by mouth 2 (two) times daily with a meal., Disp: , Rfl:  clopidogrel (PLAVIX) 75 MG tablet, Take 1 tablet (75 mg total) by mouth daily., Disp: 90 tablet, Rfl: 3;  digoxin (LANOXIN) 0.125 MG tablet, Take 1 tablet (125 mcg total) by mouth daily., Disp: 30 tablet, Rfl: 3;  furosemide (LASIX) 40 MG tablet, Take 1 tablet (40 mg total) by mouth daily., Disp: 30 tablet, Rfl: 11 HYDROcodone-acetaminophen (LORCET) 10-650 MG per tablet, Take 0.5 tablets by mouth 2 (two) times daily. For pain management, Disp: , Rfl: ;  levothyroxine (SYNTHROID, LEVOTHROID) 75 MCG tablet, Take 75 mcg by mouth daily.  , Disp: , Rfl: ;  losartan (COZAAR) 50 MG tablet, Take 50 mg by mouth daily., Disp: , Rfl: ;  Multiple Vitamins-Minerals (ICAPS PO), Take 1 capsule by mouth. Daily, Disp: , Rfl:  nitroGLYCERIN (NITROSTAT) 0.4 MG SL tablet,  Place 0.4 mg under the tongue every 5 (five) minutes as needed for chest pain. , Disp: , Rfl: ;  pantoprazole (PROTONIX) 40 MG tablet, Take 1 tablet (40 mg total) by mouth daily., Disp: 30 tablet, Rfl: 3;  polyethylene glycol powder (GLYCOLAX/MIRALAX) powder, Take 17 g by mouth daily as needed. For constipation. Mix with 6oz. Beverage of choice, Disp: , Rfl:  pregabalin (LYRICA) 50 MG capsule, Take 1 capsule (50 mg total) by mouth daily. To reduce neuropathic pain, Disp: 30 capsule, Rfl: 0;  ursodiol (ACTIGALL) 300 MG capsule, Take 300 mg by mouth 2 (two) times daily., Disp: , Rfl:   Data Reviewed       NFA:OZHYQMV, external 06/17/2012 CBC: Wbc 8.3, Rbc 3.84, Hgb 12.0, Hct 35.2   CMP: Sodium 143, Potassium 4.0, glucose 86, BUN 24, Creatinine 1.29   Total Protein 5.8   Lipid: cholesterol 125, triglyceride 146, HDL 47, LDL 49   TSH 3.840  09/16/12 Glu 76, BUN 22, Cr. 1.23, Na 1442, K+ 3.7   TC 159, TG 101, HDL 44, LDL 95  01/16/2013: WBC 13.2, hemoglobin 10.2, hematocrit 30.8, platelets   Glucose 93, BUN 15, creatinine 0.91, sodium 135, potassium 5.0. Albumin 3.2  03/10/2013: glucose 87, BUN 20, creatinine 1.16, sodium 139, potassium 5.0  03/31/2013 WBC 5.3, RBC 2.83, hemoglobin 6.8, hematocrit 21.4, MCV 76.7, MCH 24.0 platelets 270  Glucose 87, BUN 28, creatinine 1.61, sodium 141, potassium 4.1. LFTs WNL. Albumin  3.8  Total cholesterol 162, triglyceride 159, HDL 47, LDL 83.     Review of Systems  DATA OBTAINED: from patient, GENERAL: Feels well No fevers, fatigue, change in appetite or weight SKIN: No itch, rash or open wounds EYES: No eye pain, dryness or itching  No change in vision EARS: No earache, tinnitus, change in hearing NOSE: No congestion, drainage or bleeding MOUTH/THROAT: No mouth, tooth pain or sore troat  No difficulty chewing or swallowing RESPIRATORY: No cough, wheezing, SOB CARDIAC: No chest pain, palpitations  No edema. GI: No abdominal pain  No N/V/D or constipation   No heartburn or reflux  GU: No dysuria, frequency or urgency  No change in urine volume or character  MUSCULOSKELETAL: No joint pain, swelling or stiffness  No back pain  No muscle ache, pain, weakness  Gait is steady  No recent falls.  NEUROLOGIC: No dizziness, fainting, headache, No change in mental status.  PSYCHIATRIC: No feelings of anxiety, depression Sleeps well.    Physical Exam Filed Vitals:   03/31/13 1512  BP: 100/60  Pulse: 62  Temp: 96.9 F (36.1 C)  Resp: 20  Weight: 145 lb (65.772 kg)   Body mass index is 23.41 kg/(m^2).  GENERAL APPEARANCE: No acute distress, appropriately groomed, normal body habitus, pale. Alert, pleasant, conversant. HEAD: Normocephalic, atraumatic EYES: Conjunctiva pale /lids clear. Marland Kitchen  EARS:  Hearing grossly normal. NOSE: No deformity or discharge. MOUTH/THROAT: Lips w/o lesions. Oral mucosa, tongue moist, w/o lesion. Oropharynx w/o redness or lesions.  NECK: Supple, full ROM. No thyroid tenderness, enlargement or nodule RESPIRATORY: Breathing is even, unlabored. Lung sounds are clear and full.  CARDIOVASCULAR: Heart IRRR. No murmur or extra heart sounds   EDEMA: 1+ peripheral edema, non pitting (not new) MUSCULOSKELETAL: Moves all extremities with full ROM, strength and tone. Back is without kyphosis, scoliosis or spinal process tenderness. Gait is steady NEUROLOGIC: Oriented to time, place, person. Speech clear, no tremor.  PSYCHIATRIC: Mood and affect appropriate to situation  ASSESSMENT/PLAN  Anemia, iron deficiency New microcytic, hypochromic anemia. No obvious bleeding, pt will complete stool hemocult. Anemia may be related to poor nutritional status /weight loss Feb-March this year due to mesenteric ischemia.  Patient is mildly symptomatic with low blood pressure, feels a little tired. With advanced age and history of heart disease recommend transfusion. Discussed this with patient today and she agrees. Arrangements have been made for  transfusion of 2 units packed red blood cells on Friday, June 6 at [facility] outpatient center. Will hold pt's losartan until Will recheck CBC on Tuesday, June 10    Sevyn Markham T.Tiffine Henigan, NP-C 03/31/2013

## 2013-03-31 NOTE — Assessment & Plan Note (Signed)
Chronic lower extremity edema was much improved when this patient was very ill, not eating and had significant weight loss. She now has return of the edema, will  increase Lasix to twice a day. Check BMP next week

## 2013-03-31 NOTE — Assessment & Plan Note (Signed)
Statin medicines were stopped several months ago, recheck lipid status.

## 2013-03-31 NOTE — Assessment & Plan Note (Signed)
Blood pressure stable, continue current medications, check laboratory studies

## 2013-03-31 NOTE — Assessment & Plan Note (Addendum)
New microcytic, hypochromic anemia. No obvious bleeding, pt will complete stool hemocult. Anemia may be related to poor nutritional status /weight loss Feb-March this year due to mesenteric ischemia.  Patient is mildly symptomatic with low blood pressure, feels a little tired. With advanced age and history of heart disease recommend transfusion. Discussed this with patient today and she agrees. Arrangements have been made for transfusion of 2 units packed red blood cells on Friday, June 6 at [facility] outpatient center. Will hold pt's losartan for now. Will recheck CBC on Tuesday, June 10

## 2013-03-31 NOTE — Assessment & Plan Note (Signed)
Patient is asymptomatic since stenting of her mesenteric artery. She has followed up with Dr. Jeannetta Ellis and he satisfied with the results. Patient's weight gain is related to recovery from malnutrition related to this problem.

## 2013-04-02 ENCOUNTER — Other Ambulatory Visit (HOSPITAL_COMMUNITY): Payer: Self-pay | Admitting: *Deleted

## 2013-04-02 ENCOUNTER — Other Ambulatory Visit: Payer: Self-pay | Admitting: *Deleted

## 2013-04-02 DIAGNOSIS — R609 Edema, unspecified: Secondary | ICD-10-CM

## 2013-04-02 MED ORDER — FUROSEMIDE 40 MG PO TABS: ORAL_TABLET | ORAL | Status: DC

## 2013-04-03 ENCOUNTER — Encounter (HOSPITAL_COMMUNITY)
Admission: RE | Admit: 2013-04-03 | Discharge: 2013-04-03 | Disposition: A | Discharge status: Home / Self Care | Payer: Medicare Other | Source: Ambulatory Visit | Attending: Internal Medicine | Admitting: Internal Medicine

## 2013-04-03 DIAGNOSIS — D649 Anemia, unspecified: Secondary | ICD-10-CM | POA: Insufficient documentation

## 2013-04-03 LAB — PREPARE RBC (CROSSMATCH)

## 2013-04-03 MED ORDER — FUROSEMIDE 10 MG/ML IJ SOLN
40.0000 mg | Freq: Once | INTRAMUSCULAR | Status: AC
  Administered 2013-04-03: 40 mg via INTRAVENOUS
  Filled 2013-04-03: qty 4

## 2013-04-04 LAB — TYPE AND SCREEN
ABO/RH(D): AB POS
Unit division: 0

## 2013-04-07 DIAGNOSIS — D649 Anemia, unspecified: Secondary | ICD-10-CM | POA: Diagnosis not present

## 2013-04-13 ENCOUNTER — Encounter: Payer: Self-pay | Admitting: *Deleted

## 2013-04-14 ENCOUNTER — Encounter: Payer: Self-pay | Admitting: Cardiology

## 2013-04-14 ENCOUNTER — Ambulatory Visit (INDEPENDENT_AMBULATORY_CARE_PROVIDER_SITE_OTHER): Payer: Medicare Other | Admitting: Cardiology

## 2013-04-14 ENCOUNTER — Encounter (INDEPENDENT_AMBULATORY_CARE_PROVIDER_SITE_OTHER): Payer: Medicare Other

## 2013-04-14 VITALS — BP 118/56 | HR 69 | Ht 66.0 in | Wt 136.0 lb

## 2013-04-14 DIAGNOSIS — I4891 Unspecified atrial fibrillation: Secondary | ICD-10-CM

## 2013-04-14 DIAGNOSIS — I509 Heart failure, unspecified: Secondary | ICD-10-CM

## 2013-04-14 DIAGNOSIS — I2510 Atherosclerotic heart disease of native coronary artery without angina pectoris: Secondary | ICD-10-CM

## 2013-04-14 DIAGNOSIS — I6529 Occlusion and stenosis of unspecified carotid artery: Secondary | ICD-10-CM

## 2013-04-14 DIAGNOSIS — E785 Hyperlipidemia, unspecified: Secondary | ICD-10-CM

## 2013-04-14 DIAGNOSIS — R0602 Shortness of breath: Secondary | ICD-10-CM

## 2013-04-14 DIAGNOSIS — I5032 Chronic diastolic (congestive) heart failure: Secondary | ICD-10-CM

## 2013-04-14 DIAGNOSIS — K559 Vascular disorder of intestine, unspecified: Secondary | ICD-10-CM

## 2013-04-14 LAB — BASIC METABOLIC PANEL
BUN: 24 mg/dL — ABNORMAL HIGH (ref 6–23)
Calcium: 10.4 mg/dL (ref 8.4–10.5)
Creatinine, Ser: 1.1 mg/dL (ref 0.4–1.2)
GFR: 48.03 mL/min — ABNORMAL LOW (ref >60.00)

## 2013-04-14 LAB — BRAIN NATRIURETIC PEPTIDE: Pro B Natriuretic peptide (BNP): 227 pg/mL — ABNORMAL HIGH (ref 0.0–100.0)

## 2013-04-14 MED ORDER — ATORVASTATIN CALCIUM 10 MG PO TABS: 10.0000 mg | ORAL_TABLET | Freq: Every day | ORAL | Status: DC

## 2013-04-14 NOTE — Patient Instructions (Addendum)
Start atorvastatin 10mg  daily.  Your physician recommends that you return for lab work today--BMET/BNP/ Digoxin level    Your physician recommends that you return for a FASTING lipid profile /liver profile in 2 months  . Your physician wants you to follow-up in: 6 months with Dr Shirlee Latch. (December 2014).You will receive a reminder letter in the mail two months in advance. If you don't receive a letter, please call our office to schedule the follow-up appointment.

## 2013-04-15 ENCOUNTER — Encounter: Payer: Self-pay | Admitting: Internal Medicine

## 2013-04-15 LAB — DIGOXIN LEVEL: Digoxin Level: 2.1 ng/mL — ABNORMAL HIGH (ref 0.8–2.0)
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
## 2013-04-16 ENCOUNTER — Other Ambulatory Visit: Payer: Self-pay | Admitting: *Deleted

## 2013-04-16 ENCOUNTER — Other Ambulatory Visit: Payer: Self-pay | Admitting: Geriatric Medicine

## 2013-04-16 MED ORDER — PREGABALIN 50 MG PO CAPS: 50.0000 mg | ORAL_CAPSULE | Freq: Every day | ORAL | Status: DC

## 2013-05-07 ENCOUNTER — Other Ambulatory Visit: Payer: Self-pay | Admitting: Geriatric Medicine

## 2013-05-07 DIAGNOSIS — N183 Chronic kidney disease, stage 3 unspecified: Secondary | ICD-10-CM | POA: Diagnosis not present

## 2013-05-07 DIAGNOSIS — D509 Iron deficiency anemia, unspecified: Secondary | ICD-10-CM | POA: Diagnosis not present

## 2013-05-08 ENCOUNTER — Other Ambulatory Visit: Payer: Self-pay | Admitting: Geriatric Medicine

## 2013-05-08 MED ORDER — ALPRAZOLAM 0.25 MG PO TABS: 0.2500 mg | ORAL_TABLET | Freq: Two times a day (BID) | ORAL | Status: DC

## 2013-05-11 ENCOUNTER — Other Ambulatory Visit: Payer: Self-pay | Admitting: Geriatric Medicine

## 2013-05-11 MED ORDER — DIGOXIN 125 MCG PO TABS: 0.1250 mg | ORAL_TABLET | Freq: Every day | ORAL | Status: DC

## 2013-05-27 ENCOUNTER — Non-Acute Institutional Stay: Payer: Medicare Other | Admitting: Geriatric Medicine

## 2013-05-27 ENCOUNTER — Encounter: Payer: Self-pay | Admitting: Geriatric Medicine

## 2013-05-27 VITALS — BP 120/64 | HR 60 | Ht 66.0 in | Wt 140.0 lb

## 2013-05-27 DIAGNOSIS — E785 Hyperlipidemia, unspecified: Secondary | ICD-10-CM | POA: Diagnosis not present

## 2013-05-27 DIAGNOSIS — D509 Iron deficiency anemia, unspecified: Secondary | ICD-10-CM

## 2013-05-27 DIAGNOSIS — R609 Edema, unspecified: Secondary | ICD-10-CM

## 2013-05-27 NOTE — Assessment & Plan Note (Signed)
Lower extremity edema is at her usual baseline. Tolerating b.i.d. dosing of Lasix, most recent electrolytes  satisfactory off potassium supplement. Repeat BMP in August. Patient continues to wear compression hose daily and continues yoga practice.Marland Kitchen

## 2013-05-27 NOTE — Assessment & Plan Note (Signed)
Iron supplement started 05/07/2013. Patient tolerating reasonably well, mild constipation. Increase MiraLax to help with bowels. Repeat CBC mid August.

## 2013-05-27 NOTE — Progress Notes (Signed)
Patient ID: Debra Brooks, female   DOB: Jun 30, 1925, 79 y.o.   MRN: [id number] [facility] [phone])  Code Status: DNR, MOST form  Contact Information   Name Relation Home Work Mobile   Biller,[name] Relative [phone]     Hassan Rowan   [phone]   Cocciolo,Patti Daughter [phone]        Chief Complaint  Patient presents with  . Medical Managment of Chronic Issues    edema, anemia    HPI: This 79 year old female resident of [facility] section returns to clinic today to follow anemia, edema. June 3 CBC showed hemoglobin of 6.8. This was markedly decreased from her prior level of 10.5. Received transfusion 2 u PRBC, initial CBC improved, repeat with small drop in Hgb, low MCV,MCH. Iron supplement started. Pt. Is tolerating OK, some mild constipation, no nausea.  LE edema is unchanged, continues BID Lasix. Most recent attempt to decreased dose resulted in increased edema. Continues daily compression hose and Yoga. Most recent electrolytes stable, no need for KCl supplement.   Allergies  Allergies  Allergen Reactions  . Iodinated Diagnostic Agents      Medication List       This list is accurate as of: 05/27/13 11:58 AM.  Always use your most recent med list.               ALPRAZolam 0.25 MG tablet  Commonly known as:  XANAX  Take 1 tablet (0.25 mg total) by mouth 2 (two) times daily. Morning and bedtime     aspirin 81 MG tablet  Take 81 mg by mouth daily.     atorvastatin 10 MG tablet  Commonly known as:  LIPITOR  Take 1 tablet (10 mg total) by mouth daily.     buPROPion 300 MG 24 hr tablet  Commonly known as:  WELLBUTRIN XL  Take 300 mg by mouth daily.     carvedilol 12.5 MG tablet  Commonly known as:  COREG  Take 12.5 mg by mouth 2 (two) times daily with a meal.     clopidogrel 75 MG tablet  Commonly known as:  PLAVIX  Take 1 tablet (75 mg total) by mouth daily.     digoxin 0.125 MG  tablet  Commonly known as:  DIGOX  Take 1 tablet (0.125 mg total) by mouth daily.     ferrous gluconate 325 MG tablet  Commonly known as:  FERGON  Take 325 mg by mouth daily with breakfast.     furosemide 40 MG tablet  Commonly known as:  LASIX  Take one tablet twice daily for swelling     HYDROcodone-acetaminophen 10-650 MG per tablet  Commonly known as:  LORCET  Take 0.5 tablets by mouth 2 (two) times daily. For pain management     ICAPS PO  Take 1 capsule by mouth. Daily     levothyroxine 75 MCG tablet  Commonly known as:  SYNTHROID, LEVOTHROID  Take 75 mcg by mouth daily.     losartan 50 MG tablet  Commonly known as:  COZAAR  Take 50 mg by mouth daily.     nitroGLYCERIN 0.4 MG SL tablet  Commonly known as:  NITROSTAT  Place 0.4 mg under the tongue every 5 (five) minutes as needed for chest pain.     pantoprazole 40 MG tablet  Commonly known as:  PROTONIX  Take 1 tablet (40 mg total) by mouth daily.     polyethylene glycol powder powder  Commonly known  asSharon Seller  Take 17 g by mouth daily as needed. For constipation. Mix with 6oz. Beverage of choice     pregabalin 50 MG capsule  Commonly known as:  LYRICA  Take 1 capsule (50 mg total) by mouth daily. To reduce neuropathic pain     ursodiol 300 MG capsule  Commonly known as:  ACTIGALL  Take 300 mg by mouth 2 (two) times daily.       Data Reviewed       ZOX:WRUEAVW, external 06/17/2012 CBC: Wbc 8.3, Rbc 3.84, Hgb 12.0, Hct 35.2   CMP: Sodium 143, Potassium 4.0, glucose 86, BUN 24, Creatinine 1.29   Total Protein 5.8   Lipid: cholesterol 125, triglyceride 146, HDL 47, LDL 49   TSH 3.840   01/16/2013: WBC 13.2, hemoglobin 10.2, hematocrit 30.8, platelets   Glucose 93, BUN 15, creatinine 0.91, sodium 135, potassium 5.0. Albumin 3.2  03/10/2013: glucose 87, BUN 20, creatinine 1.16, sodium 139, potassium 5.0  03/31/2013 WBC 5.3, RBC 2.83, hemoglobin 6.8, hematocrit 21.4, MCV 76.7, MCH 24.0 platelets  270  Glucose 87, BUN 28, creatinine 1.61, sodium 141, potassium 4.1. LFTs WNL. Albumin 3.8  Total cholesterol 162, triglyceride 159, HDL 47, LDL 83.  04/07/2013 and CBC 6.4, hemoglobin 9.9, hematocrit 30.5, Plt 297  05/07/2013 WBC 6.9, hemoglobin 9.0, hematocrit 28.2, MCV 78.6, MCH 25.1, platelets 283.  Glucose 69, BUN 28, creatinine 1.34, sodium 136, potassium 4.3    Review of Systems  DATA OBTAINED: from patient, GENERAL: Feels well No fevers, fatigue, change in appetite or weight SKIN: No itch, rash or open wounds EYES: No eye pain, dryness or itching  No change in vision EARS: No earache, tinnitus, change in hearing NOSE: No congestion, drainage or bleeding MOUTH/THROAT: No mouth, tooth pain or sore troat  No difficulty chewing or swallowing RESPIRATORY: No cough, wheezing, SOB CARDIAC: No chest pain, palpitations  LE edema. GI: No abdominal pain  No N/V/D. Mild constipation  No heartburn or reflux  GU: No dysuria, frequency or urgency  No change in urine volume or character  MUSCULOSKELETAL: No joint pain, swelling or stiffness  No back pain  No muscle ache, pain, weakness  Gait is steady  No recent falls.  NEUROLOGIC: No dizziness, fainting, headache, No change in mental status.  PSYCHIATRIC: No feelings of anxiety, depression Sleeps well.    Physical Exam Filed Vitals:   05/27/13 1140  BP: 120/64  Pulse: 60  Height: 5\' 6"  (1.676 m)  Weight: 140 lb (63.504 kg)   Body mass index is 22.61 kg/(m^2).  GENERAL APPEARANCE: No acute distress, appropriately groomed, normal body habitus, pale. Alert, pleasant, conversant. HEAD: Normocephalic, atraumatic EYES: Conjunctiva /lids clear. Marland Kitchen  EARS:  Hearing grossly normal. Rt.ear canal with cerumen, left clear NOSE: No deformity or discharge. MOUTH/THROAT: Lips w/o lesions. Oral mucosa, tongue moist, w/o lesion. Oropharynx w/o redness or lesions.  NECK: Supple, full ROM. No thyroid tenderness, enlargement or nodule RESPIRATORY:  Breathing is even, unlabored. Lung sounds are clear and full.  CARDIOVASCULAR: Heart IRRR. No murmur or extra heart sounds   EDEMA: 1+ peripheral edema, non pitting (not new) MUSCULOSKELETAL: Moves all extremities with full ROM, strength and tone. Back is without kyphosis, scoliosis or spinal process tenderness. Gait is steady NEUROLOGIC: Oriented to time, place, person. Speech clear, no tremor.  PSYCHIATRIC: Mood and affect appropriate to situation  ASSESSMENT/PLAN  Anemia, iron deficiency Iron supplement started 05/07/2013. Patient tolerating reasonably well, mild constipation. Increase MiraLax to help with bowels. Repeat CBC  mid August.  Edema Lower extremity edema is at her usual baseline. Tolerating b.i.d. dosing of Lasix, most recent electrolytes  satisfactory off potassium supplement. Repeat BMP in August. Patient continues to wear compression hose daily and continues yoga practice..  Hyperlipemia Low-dose atorvastatin was restarted by Dr. Shirlee Latch in June 2014. Plans to recheck lab in August 2014.    Angelina Neece T.Wolfgang Finigan, NP-C 05/27/2013

## 2013-05-27 NOTE — Assessment & Plan Note (Signed)
Low-dose atorvastatin was restarted by Dr. Shirlee Latch in June 2014. Plans to recheck lab in August 2014.

## 2013-05-28 ENCOUNTER — Other Ambulatory Visit: Payer: Self-pay | Admitting: Cardiology

## 2013-05-28 ENCOUNTER — Other Ambulatory Visit: Payer: Self-pay | Admitting: Geriatric Medicine

## 2013-05-28 ENCOUNTER — Other Ambulatory Visit: Payer: Self-pay | Admitting: Internal Medicine

## 2013-05-28 MED ORDER — HYDROCODONE-ACETAMINOPHEN 10-325 MG PO TABS: ORAL_TABLET | ORAL | Status: DC

## 2013-06-08 ENCOUNTER — Telehealth: Payer: Self-pay | Admitting: Cardiology

## 2013-06-08 NOTE — Telephone Encounter (Signed)
New prob  Pt is having lab work done at [facility] on the 14th and she wants to know if she can have the lab work that is supposed to be done here, done there.

## 2013-06-08 NOTE — Telephone Encounter (Signed)
Returned call to patient she stated she would like to have lab work scheduled 06/16/13 to be done at [facility].Stated she is scheduled to have lab done there 06/11/13.Advised ok to have fasting lipid and hepatic panels done at [facility] 06/11/13.Please fax results to Dr.McLean at fax # [phone].

## 2013-06-09 ENCOUNTER — Other Ambulatory Visit: Payer: Self-pay | Admitting: Internal Medicine

## 2013-06-11 ENCOUNTER — Encounter: Payer: Self-pay | Admitting: Cardiology

## 2013-06-11 DIAGNOSIS — I10 Essential (primary) hypertension: Secondary | ICD-10-CM | POA: Diagnosis not present

## 2013-06-11 DIAGNOSIS — D509 Iron deficiency anemia, unspecified: Secondary | ICD-10-CM | POA: Diagnosis not present

## 2013-06-11 LAB — LIPID PANEL
Cholesterol: 137 mg/dL (ref 0–200)
HDL: 55 mg/dL (ref 35–70)
Triglycerides: 126 mg/dL (ref 40–160)

## 2013-06-11 LAB — BASIC METABOLIC PANEL
Creatinine: 1.3 mg/dL — AB (ref 0.5–1.1)
Glucose: 82 mg/dL
Potassium: 4.2 mmol/L (ref 3.4–5.3)

## 2013-06-11 LAB — CBC AND DIFFERENTIAL
Hemoglobin: 10.7 g/dL — AB (ref 12.0–16.0)
Platelets: 220 10*3/uL (ref 150–399)
WBC: 5.6 10^3/mL

## 2013-06-11 LAB — HEPATIC FUNCTION PANEL: ALT: 9 U/L (ref 7–35)

## 2013-06-16 ENCOUNTER — Other Ambulatory Visit: Payer: Medicare Other

## 2013-07-13 DIAGNOSIS — H3530 Unspecified macular degeneration: Secondary | ICD-10-CM | POA: Diagnosis not present

## 2013-07-13 DIAGNOSIS — H524 Presbyopia: Secondary | ICD-10-CM | POA: Diagnosis not present

## 2013-07-13 DIAGNOSIS — H52229 Regular astigmatism, unspecified eye: Secondary | ICD-10-CM | POA: Diagnosis not present

## 2013-07-24 DIAGNOSIS — L819 Disorder of pigmentation, unspecified: Secondary | ICD-10-CM | POA: Diagnosis not present

## 2013-07-24 DIAGNOSIS — L723 Sebaceous cyst: Secondary | ICD-10-CM | POA: Diagnosis not present

## 2013-07-24 DIAGNOSIS — L909 Atrophic disorder of skin, unspecified: Secondary | ICD-10-CM | POA: Diagnosis not present

## 2013-07-24 DIAGNOSIS — L821 Other seborrheic keratosis: Secondary | ICD-10-CM | POA: Diagnosis not present

## 2013-07-24 DIAGNOSIS — L570 Actinic keratosis: Secondary | ICD-10-CM | POA: Diagnosis not present

## 2013-07-24 DIAGNOSIS — D1801 Hemangioma of skin and subcutaneous tissue: Secondary | ICD-10-CM | POA: Diagnosis not present

## 2013-07-24 DIAGNOSIS — D692 Other nonthrombocytopenic purpura: Secondary | ICD-10-CM | POA: Diagnosis not present

## 2013-07-24 DIAGNOSIS — I8390 Asymptomatic varicose veins of unspecified lower extremity: Secondary | ICD-10-CM | POA: Diagnosis not present

## 2013-08-03 ENCOUNTER — Other Ambulatory Visit: Payer: Self-pay | Admitting: Cardiology

## 2013-08-10 ENCOUNTER — Non-Acute Institutional Stay: Payer: Medicare Other | Admitting: Internal Medicine

## 2013-08-10 ENCOUNTER — Encounter: Payer: Self-pay | Admitting: Internal Medicine

## 2013-08-10 VITALS — BP 130/60 | HR 64 | Ht 66.0 in | Wt 142.0 lb

## 2013-08-10 DIAGNOSIS — D509 Iron deficiency anemia, unspecified: Secondary | ICD-10-CM

## 2013-08-10 DIAGNOSIS — I10 Essential (primary) hypertension: Secondary | ICD-10-CM | POA: Diagnosis not present

## 2013-08-10 DIAGNOSIS — E785 Hyperlipidemia, unspecified: Secondary | ICD-10-CM | POA: Diagnosis not present

## 2013-08-10 MED ORDER — ALPRAZOLAM 0.25 MG PO TABS: ORAL_TABLET | ORAL | Status: DC

## 2013-08-10 NOTE — Progress Notes (Signed)
Subjective:    Patient ID: Debra Brooks, female    DOB: [birth year], 79 y.o.   MRN: [id number]  HPI Hypertension: controlled  Chronic kidney disease (CKD), stage III (moderate): unchanged  Anemia, iron deficiency: imroved  Hyperlipemia: good control  Depression: controlled on bupropion. Less anxiety.  She wants fewer medications.    Current Outpatient Prescriptions on File Prior to Visit  Medication Sig Dispense Refill  . ALPRAZolam (XANAX) 0.25 MG tablet Take 1 tablet (0.25 mg total) by mouth 2 (two) times daily. Morning and bedtime  60 tablet  3  . aspirin 81 MG tablet Take 81 mg by mouth daily.        Marland Kitchen atorvastatin (LIPITOR) 10 MG tablet TAKE 1 TABLET ONCE DAILY.  30 tablet  1  . buPROPion (WELLBUTRIN XL) 300 MG 24 hr tablet TAKE (1) TABLET DAILY IN THE MORNING.  30 tablet  3  . carvedilol (COREG) 12.5 MG tablet TAKE 1 TABLET TWICE DAILY WITH A MEAL.  60 tablet  4  . clopidogrel (PLAVIX) 75 MG tablet Take 1 tablet (75 mg total) by mouth daily.  90 tablet  3  . digoxin (DIGOX) 0.125 MG tablet Take 1 tablet (0.125 mg total) by mouth daily.  30 tablet  3  . ferrous gluconate (FERGON) 325 MG tablet Take 325 mg by mouth daily with breakfast.      . furosemide (LASIX) 40 MG tablet Take one tablet twice daily for swelling  60 tablet  3  . HYDROcodone-acetaminophen (NORCO) 10-325 MG per tablet Take 1/2 tablet twice a day for pain management.  30 tablet  5  . losartan (COZAAR) 50 MG tablet TAKE 1 TABLET EACH DAY.  30 tablet  4  . Multiple Vitamins-Minerals (ICAPS PO) Take 1 capsule by mouth. Daily      . nitroGLYCERIN (NITROSTAT) 0.4 MG SL tablet Place 0.4 mg under the tongue every 5 (five) minutes as needed for chest pain.       . pantoprazole (PROTONIX) 40 MG tablet TAKE 1 TABLET ONCE DAILY FOR STOMACH ACID.  30 tablet  3  . polyethylene glycol powder (GLYCOLAX/MIRALAX) powder Take 17 g by mouth daily as needed. For constipation. Mix with 6oz. Beverage of choice      . pregabalin  (LYRICA) 50 MG capsule Take 1 capsule (50 mg total) by mouth daily. To reduce neuropathic pain  30 capsule  3  . SYNTHROID 75 MCG tablet TAKE 1 TABLET ONCE DAILY.  30 tablet  3  . ursodiol (ACTIGALL) 300 MG capsule Take 300 mg by mouth 2 (two) times daily.       No current facility-administered medications on file prior to visit.    Review of Systems  Constitutional: Negative for fever, chills, diaphoresis, activity change, appetite change and fatigue.  HENT: Negative.   Eyes: Negative for pain, discharge, redness and itching.  Respiratory: Negative for apnea, cough, choking and chest tightness.   Cardiovascular: Positive for leg swelling. Negative for chest pain and palpitations.  Gastrointestinal: Negative for abdominal pain, abdominal distention and anal bleeding.  Endocrine: Negative.   Genitourinary: Negative.   Skin: Negative for pallor and rash.  Allergic/Immunologic: Positive for environmental allergies.  Neurological: Negative for dizziness, tremors, seizures, syncope, facial asymmetry, speech difficulty, weakness, light-headedness and headaches.  Hematological: Negative for adenopathy. Does not bruise/bleed easily.  Psychiatric/Behavioral: Positive for sleep disturbance. Negative for suicidal ideas, hallucinations, behavioral problems, confusion, self-injury and agitation. The patient is not nervous/anxious and is not hyperactive.  Objective:BP 130/60  Pulse 64  Ht 5\' 6"  (1.676 m)  Wt 142 lb (64.411 kg)  BMI 22.93 kg/m2    Physical Exam  Constitutional: She appears distressed.  HENT:  Head: Normocephalic and atraumatic.  Left Ear: External ear normal.  Eyes: Conjunctivae and EOM are normal.  Corrective lenses.  Neck: No JVD present. No tracheal deviation present. No thyromegaly present.  Cardiovascular: Normal rate, regular rhythm, normal heart sounds and intact distal pulses.  Exam reveals no gallop and no friction rub.   No murmur heard. Pulmonary/Chest: No  respiratory distress. She has no wheezes. She has no rales.  Abdominal: She exhibits no distension and no mass. There is no tenderness. There is no rebound and no guarding.  Musculoskeletal: She exhibits no edema and no tenderness.  Lymphadenopathy:    She has no cervical adenopathy.  Neurological: She is alert. No cranial nerve deficit. Coordination normal.  Skin: No rash noted. No erythema. No pallor.  Psychiatric: She has a normal mood and affect. Her behavior is normal. Judgment and thought content normal.      LAB REVIEW 03/31/13 Hgb 6/7 06/11/13 CBC: Hgb 10.7, MCV 86.1  BMP: glu 82, B*UN 26, creat 1.27  Lipids TC 137, trig 126, HDL 55, LDL 57  Liver panel; nl    Assessment & Plan:  Hypertension: controlled  Chronic kidney disease (CKD), stage III (moderate): stable  Anemia, iron deficiency: improved  Hyperlipemia: controlled

## 2013-08-10 NOTE — Patient Instructions (Signed)
Stop Protonix.  Reduce alprazolam to bedtime only.

## 2013-08-11 ENCOUNTER — Telehealth: Payer: Self-pay | Admitting: Cardiology

## 2013-08-11 NOTE — Telephone Encounter (Signed)
Will forward to Dr McLean 

## 2013-08-11 NOTE — Telephone Encounter (Signed)
New message   pcp suggested she not take protonix--want to make sure dr Shirlee Latch says it is ok to stop rx

## 2013-08-11 NOTE — Telephone Encounter (Signed)
Would be ok to stop Protonix.

## 2013-08-11 NOTE — Telephone Encounter (Signed)
Pt.notified

## 2013-08-18 ENCOUNTER — Other Ambulatory Visit: Payer: Self-pay | Admitting: *Deleted

## 2013-08-18 DIAGNOSIS — Z23 Encounter for immunization: Secondary | ICD-10-CM | POA: Diagnosis not present

## 2013-08-18 MED ORDER — PREGABALIN 50 MG PO CAPS: 50.0000 mg | ORAL_CAPSULE | Freq: Every day | ORAL | Status: DC

## 2013-08-27 ENCOUNTER — Telehealth: Payer: Self-pay

## 2013-08-27 NOTE — Telephone Encounter (Signed)
PCS nurse called, pt wants to go back taking Alprazolam twice daily. Dr. Chilton Si decreased it on 08/10/13 to once at bedtime.  She's anxious. Fax order to [phone].

## 2013-08-31 ENCOUNTER — Other Ambulatory Visit: Payer: Self-pay | Admitting: Internal Medicine

## 2013-09-03 ENCOUNTER — Other Ambulatory Visit: Payer: Self-pay

## 2013-09-14 ENCOUNTER — Other Ambulatory Visit: Payer: Self-pay | Admitting: Internal Medicine

## 2013-09-14 MED ORDER — ALPRAZOLAM 0.25 MG PO TABS: ORAL_TABLET | ORAL | Status: DC

## 2013-09-21 DIAGNOSIS — R799 Abnormal finding of blood chemistry, unspecified: Secondary | ICD-10-CM | POA: Diagnosis not present

## 2013-09-21 DIAGNOSIS — K8020 Calculus of gallbladder without cholecystitis without obstruction: Secondary | ICD-10-CM | POA: Diagnosis not present

## 2013-09-21 DIAGNOSIS — K219 Gastro-esophageal reflux disease without esophagitis: Secondary | ICD-10-CM | POA: Diagnosis not present

## 2013-09-21 DIAGNOSIS — Z79899 Other long term (current) drug therapy: Secondary | ICD-10-CM | POA: Diagnosis not present

## 2013-09-22 DIAGNOSIS — H35369 Drusen (degenerative) of macula, unspecified eye: Secondary | ICD-10-CM | POA: Diagnosis not present

## 2013-09-22 DIAGNOSIS — H3530 Unspecified macular degeneration: Secondary | ICD-10-CM | POA: Diagnosis not present

## 2013-09-24 IMAGING — XA IR ANGIO/VISCERAL SELECTIVE EA VESSEL WO/W FLUSH
1 series · 12 of 24 positions shown · non-contrast
Comparison: none

CLINICAL DATA: Chronic mesenteric ischemia.  High grade origin
stenoses of the celiac axis and superior mesenteric artery on
recent CTA.

[Series 1: run · 12 of 100 slices shown]
[im 5/100]
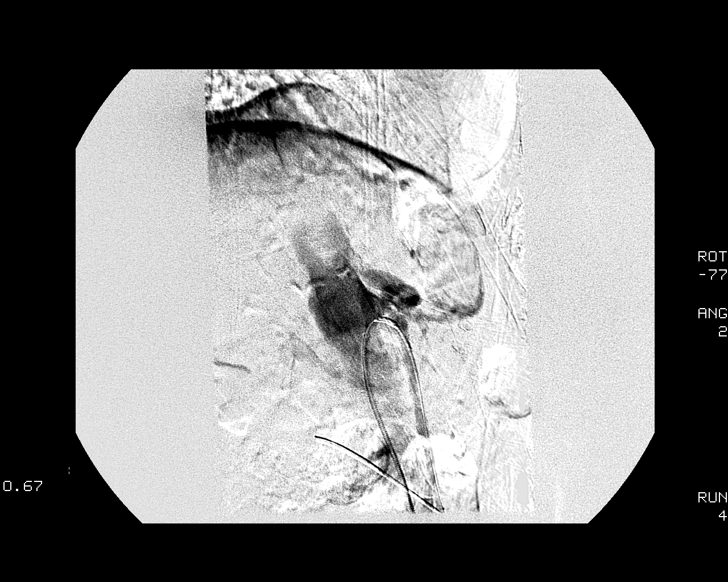
[im 13/100]
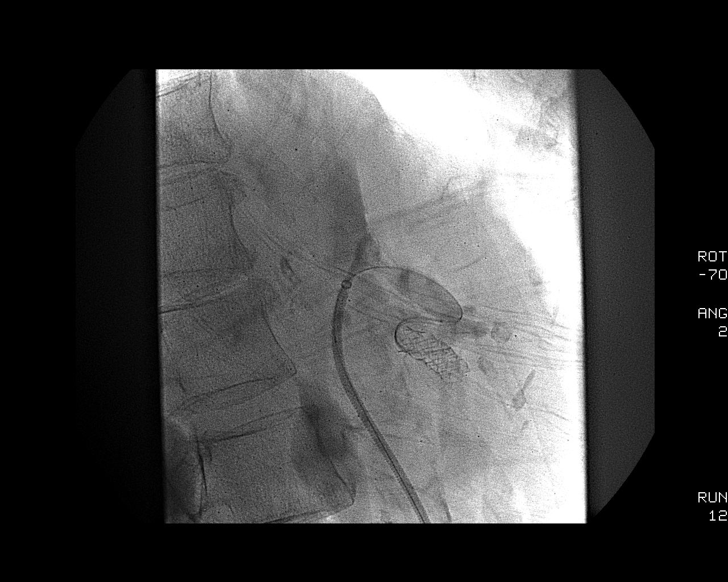
[im 22/100]
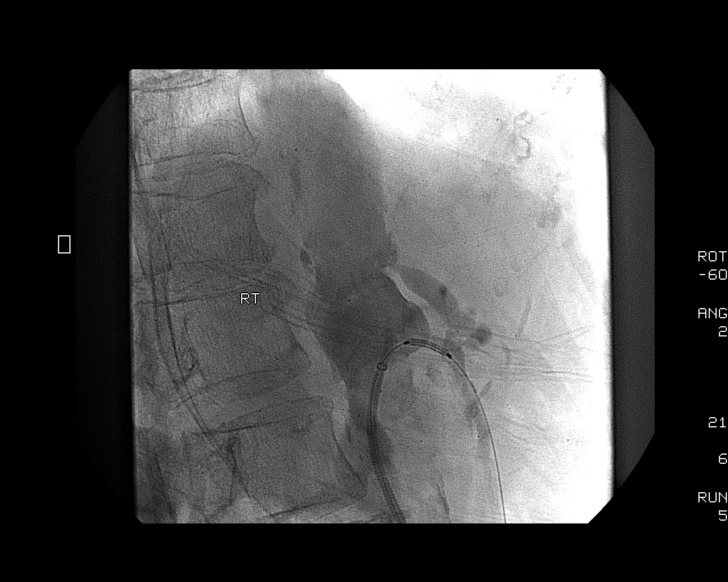
[im 31/100]
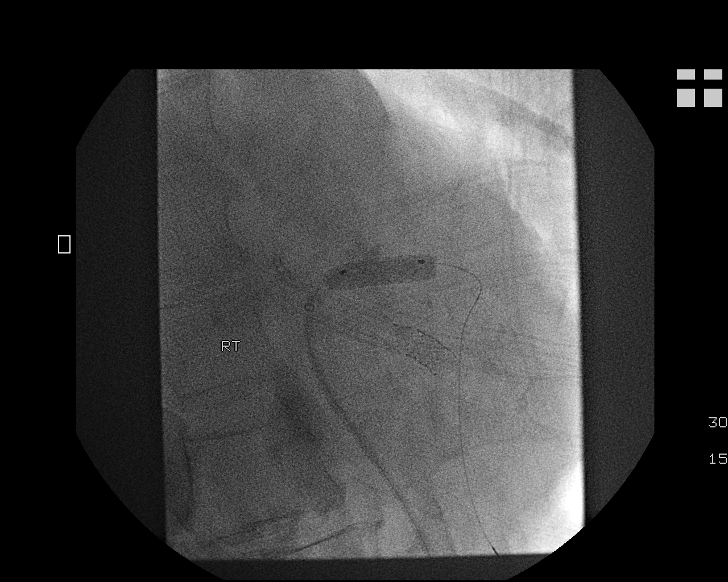
[im 39/100]
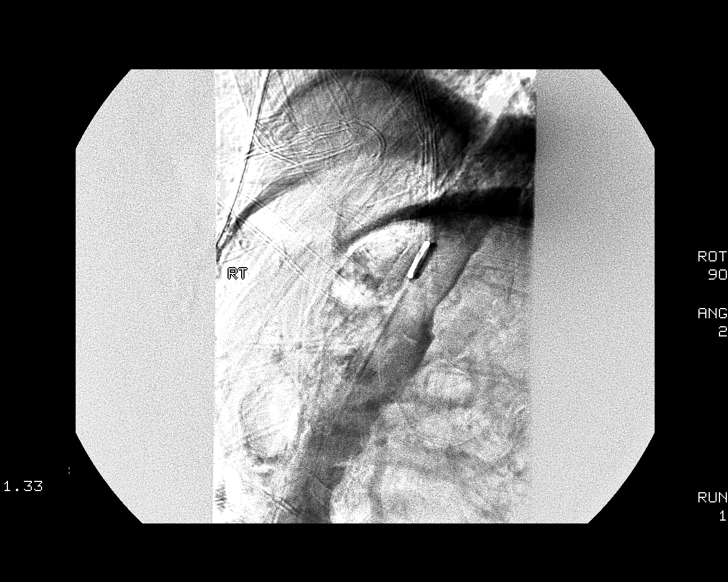
[im 48/100]
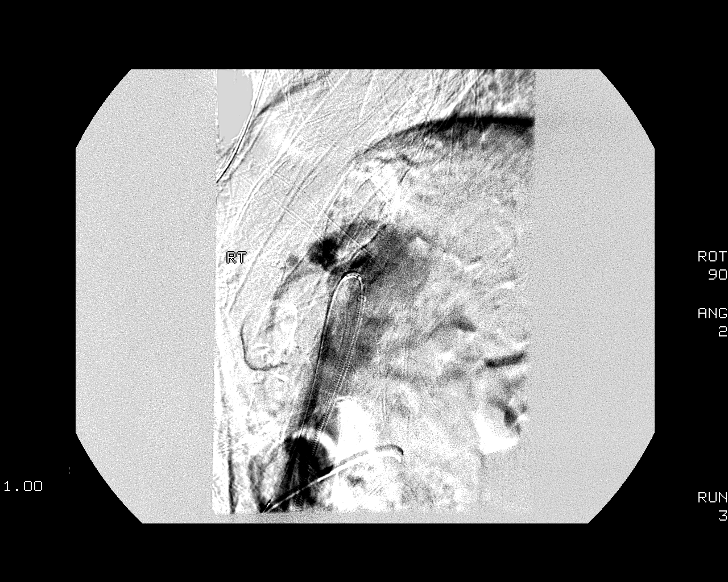
[im 56/100]
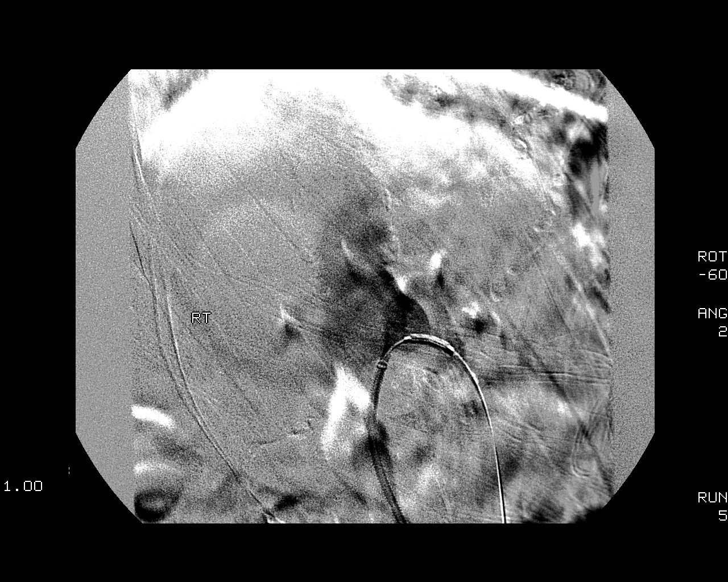
[im 65/100]
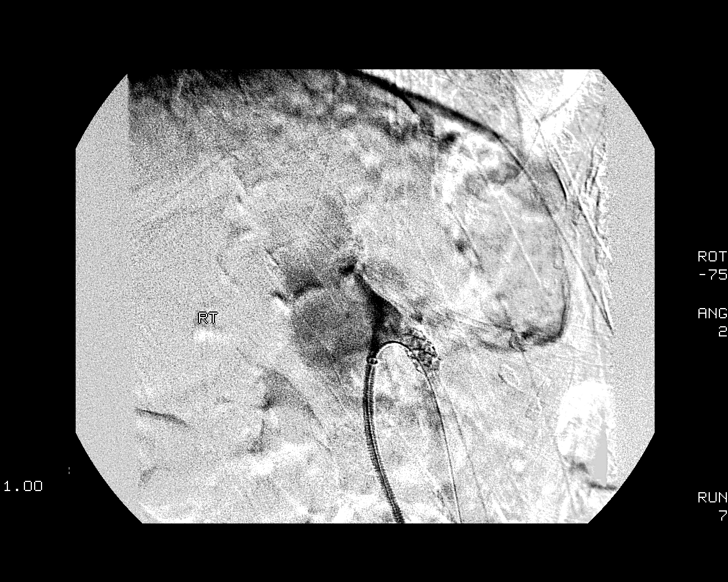
[im 74/100]
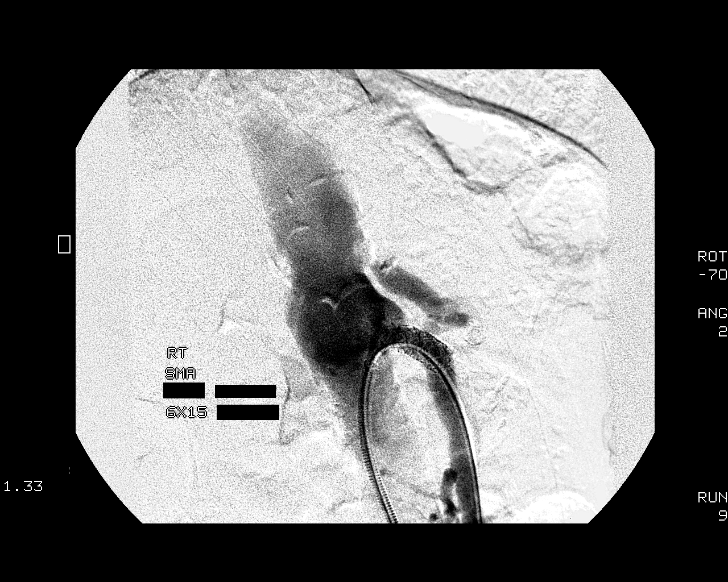
[im 82/100]
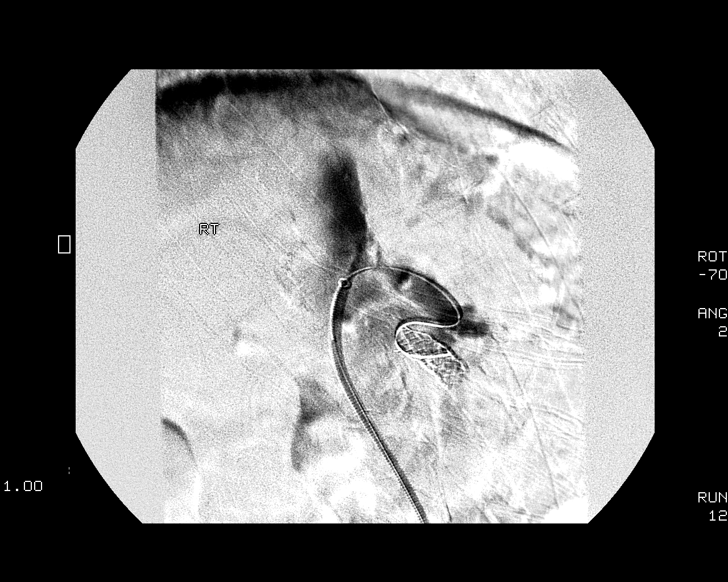
[im 91/100]
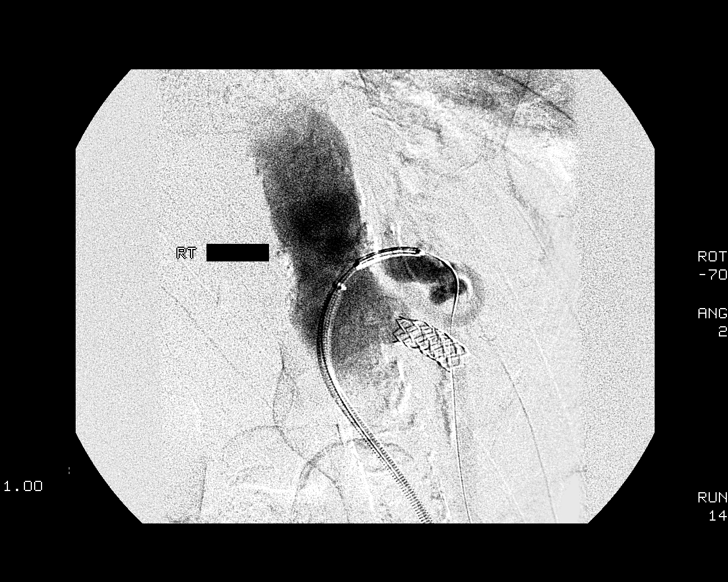
[im 100/100]
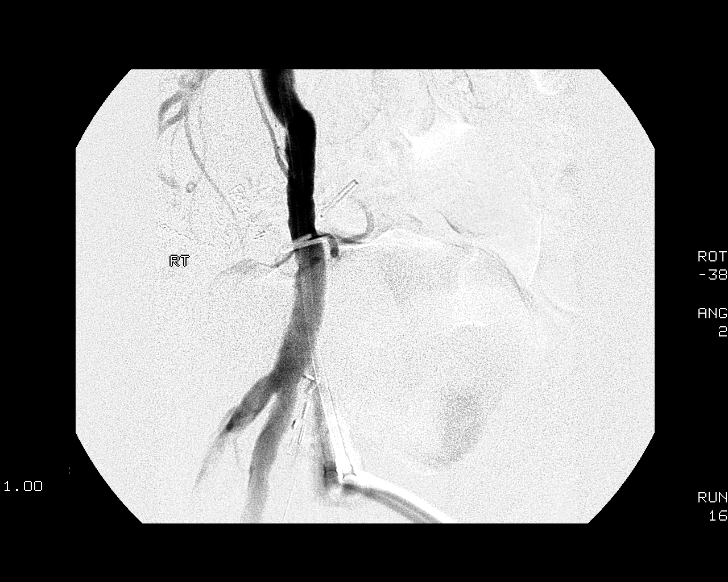

[12 of 24 positions shown; findings below may reference images not displayed]

MESENTERIC ARTERIOGRAM
STENT ASSISTED ANGIOPLASTY SUPERIOR MESENTERIC ARTERY
STENT ASSISTED ANGIOPLASTY CELIAC AXIS
ULTRASOUND GUIDANCE FOR VASCULAR ACCESS

Technique and findings: The procedure, risks (including but not
limited to bleeding, infection, organ damage), benefits, and
alternatives were explained to the patient.  Questions regarding
the procedure were encouraged and answered.  The patient
understands and consents to the procedure.The right femoral region
was prepped and draped usual sterile fashion. Maximal barrier
sterile technique was utilized including caps, mask, sterile gowns,
sterile gloves, sterile drape, hand hygiene and skin antiseptic.

Intravenous Fentanyl and Versed were administered as conscious
sedation during continuous cardiorespiratory monitoring by the
radiology RN, with a total moderate sedation time of 118 minutes.

Under real time ultrasound guidance, the right common femoral
artery was accessed with a 19 gauge percutaneous entry needle using
single-wall technique in a single pass.  Ultrasound documentation
was stored.  The needle was exchanged over a Benson wire for a 5-
French vascular sheath, through which a 5-French pigtail catheter
was advanced for flush aortography in the lateral projection.

This confirmed high grade short segment stenoses of the celiac axis
and superior mesenteric artery origins.  There is diffuse
atheromatous irregularity of the abdominal aorta.  The catheter and
sheath were exchanged for a long 6-French angled vascular sheath,
through which a 5-French Sos catheter was advanced and used to
catheterize the origin of the superior mesenteric artery. 3000u
heparin were administered intravenously.  The Sos was exchanged for
a 018 steel core guide wire.  A 6 mm x 12 mm Palmaz Blue stent was
deployed across the lesion.  After follow-up arteriography, a
second overlapping 6 x 15 mm stent was deployed extending across
the more proximal aspect of the lesion, 1-2 mm into the lumen of
the abdominal aorta.  The stents were then further dilated with 7
mm x 2 cm Domace angioplasty balloon.  Follow-up arteriography
demonstrates good stent position and apposition.  There is no
significant residual or recurrent stenosis, dissection,
extravasation, or other apparent complication.

In similar fashion, the celiac axis was catheterized using  a
reverse curve Contra catheter, exchanged over a guide wire for the
018 steel core guide wire.  Because of the difficulty passing
catheters across the lesion, the lesion was predilated with a 4 mm
x 2 cm Mustang balloon, which allowed deployment of a 6 mm x 18 mm
Palmaz Blue stent across the lesion.  The stent was further dilated
with the 7 mm Mustang balloon.  Final arteriography demonstrates
good stent positioning with no significant residual or recurrent
stenosis, dissection, extravasation, or other apparent
complication.

After confirmatory right femoral arteriogram, the sheath was
removed and hemostasis achieved with the aid of the Exoseal device.
The patient tolerated the procedure well.  No immediate
complication.
IMPRESSION: 1.  High grade short segment superior mesenteric artery origin
stenosis, successfully treated with stent assisted angioplasty as
above.
2.  High grade short segment celiac axis origin stenosis,
successfully responding to stent assisted angioplasty as above.

## 2013-09-29 ENCOUNTER — Other Ambulatory Visit: Payer: Self-pay | Admitting: Cardiology

## 2013-10-12 ENCOUNTER — Other Ambulatory Visit: Payer: Self-pay | Admitting: Cardiology

## 2013-10-12 ENCOUNTER — Other Ambulatory Visit: Payer: Self-pay | Admitting: Internal Medicine

## 2013-10-12 ENCOUNTER — Other Ambulatory Visit: Payer: Self-pay | Admitting: Nurse Practitioner

## 2013-10-14 ENCOUNTER — Non-Acute Institutional Stay: Payer: Medicare Other | Admitting: Geriatric Medicine

## 2013-10-14 ENCOUNTER — Encounter: Payer: Self-pay | Admitting: Geriatric Medicine

## 2013-10-14 VITALS — BP 124/64 | HR 52 | Temp 96.0°F | Ht 66.0 in | Wt 143.0 lb

## 2013-10-14 DIAGNOSIS — B9789 Other viral agents as the cause of diseases classified elsewhere: Secondary | ICD-10-CM

## 2013-10-14 DIAGNOSIS — B348 Other viral infections of unspecified site: Secondary | ICD-10-CM | POA: Insufficient documentation

## 2013-10-14 MED ORDER — LORATADINE 10 MG PO TABS: 10.0000 mg | ORAL_TABLET | ORAL | Status: DC

## 2013-10-14 NOTE — Progress Notes (Signed)
Patient ID: Debra Brooks, female   DOB: Nov 14, 1924, 79 y.o.   MRN: [id number] [facility] [phone])  Code Status: DNR, MOST form      Contact Information   Name Relation Home Work Mobile   Rains,[name] Relative [phone]     Hassan Rowan   [phone]   Cocciolo,Patti Daughter [phone]        Chief Complaint  Patient presents with  . Cough    runny nose, cold, since 09/25/13 off and on. Has dry cough now.     HPI: This 79 year old female resident of [facility] section returns to clinic today due to recent cold symptoms. Started with mild sore throat, then cough. Minimally productive. No fever, muscle aches or malaise. Cough persists, runny nose as well.  Is going out of town for Christmas holiday, wants to be sure she is OK to travel.    Allergies  Allergen Reactions  . Iodinated Diagnostic Agents      Medication List       This list is accurate as of: 10/14/13  1:10 PM.  Always use your most recent med list.               ALPRAZolam 0.25 MG tablet  Commonly known as:  XANAX  One at bedtime if needed for rest     aspirin 81 MG tablet  Take 81 mg by mouth daily.     atorvastatin 10 MG tablet  Commonly known as:  LIPITOR  TAKE 1 TABLET ONCE DAILY.     buPROPion 300 MG 24 hr tablet  Commonly known as:  WELLBUTRIN XL  TAKE (1) TABLET DAILY IN THE MORNING.     carvedilol 12.5 MG tablet  Commonly known as:  COREG  TAKE 1 TABLET TWICE DAILY WITH A MEAL.     DIGOX 0.125 MG tablet  Generic drug:  digoxin  TAKE 1 TABLET ONCE DAILY.     ferrous gluconate 325 MG tablet  Commonly known as:  FERGON  Take 325 mg by mouth daily with breakfast.     furosemide 40 MG tablet  Commonly known as:  LASIX  TAKE ONE TABLET TWICE DAILY FOR SWELLING.     HYDROcodone-acetaminophen 10-325 MG per tablet  Commonly known as:  NORCO  Take 1/2 tablet twice a day for pain management.     ICAPS PO    Take 1 capsule by mouth. Daily     losartan 50 MG tablet  Commonly known as:  COZAAR  TAKE (1) TABLET DAILY FOR HIGH BLOOD PRESSURE.     nitroGLYCERIN 0.4 MG SL tablet  Commonly known as:  NITROSTAT  Place 0.4 mg under the tongue every 5 (five) minutes as needed for chest pain.     polyethylene glycol powder powder  Commonly known as:  GLYCOLAX/MIRALAX  Take 17 g by mouth daily as needed. For constipation. Mix with 6oz. Beverage of choice     pregabalin 50 MG capsule  Commonly known as:  LYRICA  Take 1 capsule (50 mg total) by mouth daily. To reduce neuropathic pain     SYNTHROID 75 MCG tablet  Generic drug:  levothyroxine  TAKE 1 TABLET ONCE DAILY.     ursodiol 300 MG capsule  Commonly known as:  ACTIGALL  Take 300 mg by mouth 2 (two) times daily.       Data Reviewed       UVO:[id number], external 06/17/2012   TSH 3.840   01/16/2013: WBC  13.2, hemoglobin 10.2, hematocrit 30.8, platelets   Glucose 93, BUN 15, creatinine 0.91, sodium 135, potassium 5.0. Albumin 3.2  03/10/2013: glucose 87, BUN 20, creatinine 1.16, sodium 139, potassium 5.0  03/31/2013 WBC 5.3, RBC 2.83, hemoglobin 6.8, hematocrit 21.4, MCV 76.7, MCH 24.0 platelets 270  Glucose 87, BUN 28, creatinine 1.61, sodium 141, potassium 4.1. LFTs WNL. Albumin 3.8  Total cholesterol 162, triglyceride 159, HDL 47, LDL 83.  04/07/2013 WBC 6.4, hemoglobin 9.9, hematocrit 30.5, Plt 297  05/07/2013 WBC 6.9, hemoglobin 9.0, hematocrit 28.2, MCV 78.6, MCH 25.1, platelets 283.  Glucose 69, BUN 28, creatinine 1.34, sodium 136, potassium 4.3  Lab Results- [facility] 06/11/2013  Component Value   WBC 5.6   HGB 10.7*   HCT 33*   MCV 87   MCH 29   PLT 220       CHOL 137   TRIG 126   HDL 55   LDLCALC 57       ALT 9   AST 13   NA 141   K 4.2   Glucose 82   CREATININE 1.3*   BUN 26*       Review of Systems  DATA OBTAINED: from patient, GENERAL: Feels "OK" See HPI SKIN: No itch, rash or open wounds EYES: No  eye pain, dryness or itching  No change in vision EARS: No earache, tinnitus, change in hearing. Mild ear congestion when blowing her nose NOSE: No congestion, drainage or bleeding MOUTH/THROAT: No mouth, tooth pain or sore throat  No difficulty chewing or swallowing RESPIRATORY: Mild cough, No wheezing, SOB CARDIAC: No chest pain, palpitations  LE edema. GI: No abdominal pain  No N/V/D. Mild constipation  No heartburn or reflux  GU: No dysuria, frequency or urgency  No change in urine volume or character  MUSCULOSKELETAL: No joint pain, swelling or stiffness  No back pain  No muscle ache, pain, weakness  Gait is steady  No recent falls.  NEUROLOGIC: No dizziness, fainting, headache, No change in mental status.  PSYCHIATRIC: No feelings of anxiety, depression Sleeps well.    Physical Exam Filed Vitals:   10/14/13 1554  BP: 124/64  Pulse: 52  Temp: 96 F (35.6 C)  TempSrc: Oral  Height: 5\' 6"  (1.676 m)  Weight: 143 lb (64.864 kg)   Body mass index is 23.09 kg/(m^2).  GENERAL APPEARANCE: No acute distress, appropriately groomed, normal body habitus. Alert, pleasant, conversant. HEAD: Normocephalic, atraumatic EYES: Conjunctiva /lids clear. Marland Kitchen  EARS:  Hearing grossly normal. Bilateral ear canal clear, TM WNL NOSE: No deformity or discharge. MOUTH/THROAT: Lips w/o lesions. Oral mucosa, tongue moist, w/o lesion. Oropharynx w/o redness or lesions.  NECK: Supple, full ROM. No thyroid tenderness, enlargement or nodule RESPIRATORY: Breathing is even, unlabored. Lung sounds are clear and full.  CARDIOVASCULAR: Heart RRR. No murmur or extra heart sounds MUSCULOSKELETAL:  Gait is steady NEUROLOGIC: Oriented to time, place, person. Speech clear, no tremor.  PSYCHIATRIC: Mood and affect appropriate to situation  ASSESSMENT/PLAN  Rhinovirus Symptoms are most consistent with viral illness, she has persistent postnasal drainage causing a dry cough. Recommend antihistamine twice a day for 5 days  then once a day until cough resolves. Reminded her that increased fluids and rest are best treatment for this kind of illness. Review general hygiene practices when traveling by air and visiting relatives with small children.   Follow up: As scheduled  Or return sooner if symptoms do not improve  Zacchaeus Halm T.Chrisoula Zegarra, NP-C 10/14/2013

## 2013-10-14 NOTE — Assessment & Plan Note (Signed)
Symptoms are most consistent with viral illness, she has persistent postnasal drainage causing a dry cough. Recommend antihistamine twice a day for 5 days then once a day until cough resolves. Reminded her that increased fluids and rest are best treatment for this kind of illness. Review general hygiene practices when traveling by air and visiting relatives with small children.

## 2013-11-03 ENCOUNTER — Other Ambulatory Visit: Payer: Self-pay | Admitting: Cardiology

## 2013-11-24 ENCOUNTER — Other Ambulatory Visit: Payer: Self-pay | Admitting: Cardiology

## 2013-11-25 ENCOUNTER — Non-Acute Institutional Stay: Payer: Medicare Other | Admitting: Geriatric Medicine

## 2013-11-25 ENCOUNTER — Encounter: Payer: Self-pay | Admitting: Geriatric Medicine

## 2013-11-25 VITALS — BP 110/62 | HR 64 | Ht 66.0 in | Wt 144.0 lb

## 2013-11-25 DIAGNOSIS — B348 Other viral infections of unspecified site: Secondary | ICD-10-CM

## 2013-11-25 DIAGNOSIS — D509 Iron deficiency anemia, unspecified: Secondary | ICD-10-CM | POA: Diagnosis not present

## 2013-11-25 DIAGNOSIS — B9789 Other viral agents as the cause of diseases classified elsewhere: Secondary | ICD-10-CM

## 2013-11-25 DIAGNOSIS — F419 Anxiety disorder, unspecified: Secondary | ICD-10-CM

## 2013-11-25 DIAGNOSIS — N183 Chronic kidney disease, stage 3 unspecified: Secondary | ICD-10-CM

## 2013-11-25 DIAGNOSIS — F411 Generalized anxiety disorder: Secondary | ICD-10-CM

## 2013-11-25 DIAGNOSIS — R609 Edema, unspecified: Secondary | ICD-10-CM

## 2013-11-25 DIAGNOSIS — R269 Unspecified abnormalities of gait and mobility: Secondary | ICD-10-CM

## 2013-11-25 DIAGNOSIS — I10 Essential (primary) hypertension: Secondary | ICD-10-CM

## 2013-11-25 MED ORDER — ALPRAZOLAM 0.25 MG PO TABS: 0.2500 mg | ORAL_TABLET | ORAL | Status: DC

## 2013-11-25 NOTE — Assessment & Plan Note (Addendum)
Patient's balance and strength continued to improve, encouraged continued regular exercise and yoga practice.

## 2013-11-25 NOTE — Progress Notes (Signed)
Patient ID: TAYLR MEUTH, female   DOB: 07-02-1925, 80 y.o.   MRN: [id number]  [facility] [phone])  Code Status: DNR, MOST form      Contact Information   Name Relation Home Work Mobile   Pucillo,[name] Relative [phone]     Serita Sheller   Eva Daughter [phone]        Chief Complaint  Patient presents with  . Medical Managment of Chronic Issues    blood pressure, anemia, cholesterol, CKD    HPI: This is a 80 y.o. female resident of [facility]   section evaluated today for management of ongoing medical issues.   Recent visits: 09/2013 Rhinovirus Symptoms are most consistent with viral illness, she has persistent postnasal drainage causing a dry cough. Recommend antihistamine twice a day for 5 days then once a day until cough resolves. Reminded her that increased fluids and rest are best treatment for this kind of illness. Review general hygiene practices when traveling by air and visiting relatives with small children.  04/2013 Anemia, iron deficiency Iron supplement started 05/07/2013. Patient tolerating reasonably well, mild constipation. Increase MiraLax to help with bowels. Repeat CBC mid August.  Edema Lower extremity edema is at her usual baseline. Tolerating b.i.d. dosing of Lasix, most recent electrolytes  satisfactory off potassium supplement. Repeat BMP in August. Patient continues to wear compression hose daily and continues yoga practice..  Hyperlipemia Low-dose atorvastatin was restarted by Dr. Aundra Dubin in June 2014. Plans to recheck lab in August 2014.  Since last visit the patient's cold symptoms resolved without problems. She's had no other acute medical issues. She followed with Dr. Aundra Dubin recently, no changes to her regimen. The patient reports feeling very well, she continues to exercise and practice yoga regularly; is feeling stronger. She is walking steps  using a cane, her energy level is excellent. She continues to participate in social activities; most recently went to the skating competition in downtown [place]. Patient has taken on managing her on medications again, she is very happy to return to this task.  Patient has recently decreased her daily dosing of alprazolam without increased anxiety. She does note taking this medication at bedtime helps her fall off to sleep, she she would like to continue this medication intermittently if she is going to be in a situation that usually causes her anxiety Most recent labs in August 2014 showed improved hemoglobin and hematocrit, all other labs were within normal limits. Repeat labs are scheduled next week. Patient recently saw the clinic nurse for ear cerumen removal, there was some irritation of the ear canal. Patient has arranged an appointment with Dr. Elwyn Reach in about 10 days for further evaluation.  Allergies  Allergen Reactions  . Iodinated Diagnostic Agents      Medication List       This list is accurate as of: 11/25/13 11:37 AM.  Always use your most recent med list.               ALPRAZolam 0.25 MG tablet  Commonly known as:  XANAX  One at bedtime if needed for rest     aspirin 81 MG tablet  Take 81 mg by mouth daily.     atorvastatin 10 MG tablet  Commonly known as:  LIPITOR  TAKE 1 TABLET ONCE DAILY.     buPROPion 300 MG 24 hr tablet  Commonly known as:  WELLBUTRIN XL  TAKE (1) TABLET DAILY IN THE MORNING.  carvedilol 12.5 MG tablet  Commonly known as:  COREG  TAKE 1 TABLET TWICE DAILY WITH A MEAL FOR HEART/BLOOD PRESSURE.     DIGOX 0.125 MG tablet  Generic drug:  digoxin  TAKE 1 TABLET ONCE DAILY.     ferrous gluconate 325 MG tablet  Commonly known as:  FERGON  Take 325 mg by mouth daily with breakfast.     furosemide 40 MG tablet  Commonly known as:  LASIX  TAKE ONE TABLET TWICE DAILY FOR SWELLING.     HYDROcodone-acetaminophen 10-325 MG per tablet    Commonly known as:  NORCO  Take 1/2 tablet twice a day for pain management.     ICAPS PO  Take 1 capsule by mouth. Daily     loratadine 10 MG tablet  Commonly known as:  CLARITIN  Take 1 tablet (10 mg total) by mouth as directed. Take 1 tablet twice daily for 5 days, then take 1 tablet daily until cough resolves     losartan 50 MG tablet  Commonly known as:  COZAAR  TAKE (1) TABLET DAILY FOR HIGH BLOOD PRESSURE.     nitroGLYCERIN 0.4 MG SL tablet  Commonly known as:  NITROSTAT  Place 0.4 mg under the tongue every 5 (five) minutes as needed for chest pain.     polyethylene glycol powder powder  Commonly known as:  GLYCOLAX/MIRALAX  Take 17 g by mouth daily as needed. For constipation. Mix with 6oz. Beverage of choice     pregabalin 50 MG capsule  Commonly known as:  LYRICA  Take 1 capsule (50 mg total) by mouth daily. To reduce neuropathic pain     SYNTHROID 75 MCG tablet  Generic drug:  levothyroxine  TAKE 1 TABLET ONCE DAILY.     ursodiol 300 MG capsule  Commonly known as:  ACTIGALL  Take 300 mg by mouth 2 (two) times daily.       Data Reviewed       NWG:NFAOZHY, external 06/17/2012   TSH 3.840   01/16/2013: WBC 13.2, hemoglobin 10.2, hematocrit 30.8, platelets   Glucose 93, BUN 15, creatinine 0.91, sodium 135, potassium 5.0. Albumin 3.2  03/10/2013: glucose 87, BUN 20, creatinine 1.16, sodium 139, potassium 5.0  03/31/2013 WBC 5.3, RBC 2.83, hemoglobin 6.8, hematocrit 21.4, MCV 76.7, MCH 24.0 platelets 270  Glucose 87, BUN 28, creatinine 1.61, sodium 141, potassium 4.1. LFTs WNL. Albumin 3.8  Total cholesterol 162, triglyceride 159, HDL 47, LDL 83.  04/07/2013 WBC 6.4, hemoglobin 9.9, hematocrit 30.5, Plt 297  05/07/2013 WBC 6.9, hemoglobin 9.0, hematocrit 28.2, MCV 78.6, MCH 25.1, platelets 283.  Glucose 69, BUN 28, creatinine 1.34, sodium 136, potassium 4.3  Lab Results- [facility] 06/11/2013  Component Value   WBC 5.6   HGB 10.7*   HCT 33*   MCV 87   MCH  29   PLT 220       CHOL 137   TRIG 126   HDL 55   LDLCALC 57       ALT 9   AST 13   NA 141   K 4.2   Glucose 82   CREATININE 1.3*   BUN 26*       REVIEW OF SYSTEMS DATA OBTAINED: from patient, GENERAL:Feels well   No fevers, fatigue, change in appetite or weight SKIN: No itch, rash or open wounds EYES: No eye pain, dryness or itching  No change in vision EARS: No earache, tinnitus, change in hearing.  NOSE: No congestion, drainage or bleeding  MOUTH/THROAT: No mouth, tooth pain or sore throat  No difficulty chewing or swallowing RESPIRATORY:  No cough, wheezing, SOB CARDIAC: No chest pain, palpitations  LE edema. GI: No abdominal pain  No N/V/D. Mild constipation, relieved with MiraLax  No heartburn or reflux  GU: No dysuria, frequency or urgency  No change in urine volume or character  MUSCULOSKELETAL: No joint pain, swelling or stiffness  No back pain  No muscle ache, pain, weakness  Gait is steady  No recent falls.  NEUROLOGIC: No dizziness, fainting, headache, No change in mental status.  PSYCHIATRIC: No feelings of anxiety, depression   Sleeps well.    PHYSICAL EXAM  Filed Vitals:   11/25/13 1348  BP: 110/62  Pulse: 64  Height: 5\' 6"  (1.676 m)  Weight: 144 lb (65.318 kg)   Body mass index is 23.25 kg/(m^2).  GENERAL APPEARANCE: No acute distress, appropriately groomed, normal body habitus. Alert, pleasant, conversant. HEAD: Normocephalic, atraumatic EYES: Conjunctiva /lids clear. Marland Kitchen  EARS:  Hearing grossly normal.  NOSE: No deformity or discharge. MOUTH/THROAT: Lips w/o lesions. Oral mucosa, tongue moist, w/o lesion. Oropharynx w/o redness or lesions.  NECK: Supple, full ROM. No thyroid tenderness, enlargement or nodule RESPIRATORY: Breathing is even, unlabored. Lung sounds are clear and full.  CARDIOVASCULAR: Heart RRR. No murmur or extra heart sounds  EDEMA: Trace edema bilateral lower extremities MUSCULOSKELETAL: Moves all extremities with full ROM,  strength and tone. Back is without kyphosis, scoliosis or spinal process tenderness. Gait is steady NEUROLOGIC: Oriented to time, place, person. Speech clear, no tremor.  PSYCHIATRIC: Mood and affect appropriate to situation  ASSESSMENT/PLAN  UNSTEADY GAIT  Patient's balance and strength continued to improve, encouraged continued regular exercise and yoga practice.  Hypertension Blood pressure remains well controlled, continue current medications, update labs scheduled.   Anxiety Long-standing anxiety well managed with low-dose alprazolam. Continue dosing regularly at bedtime, discussed minimizing daytime dosing of this medication to situations that cause her significant anxiety.   Anemia, iron deficiency Patient continues daily iron supplement, no sinus symptoms of worsening anemia. Repeat CBC as scheduled next week.  Rhinovirus Resolved  Edema  lhronicLower extremity edema very well managed with current medication and exercise program.  Chronic kidney disease (CKD), stage III (moderate) Lab scheduled next week   Follow up: Will call patient with results of lab studies. Followup 02/2014 Dr.Green as scheduled  Yohannes Waibel T.Crystalann Korf, NP-C 11/25/2013

## 2013-11-30 NOTE — Assessment & Plan Note (Signed)
Blood pressure remains well controlled, continue current medications, update labs scheduled.

## 2013-11-30 NOTE — Assessment & Plan Note (Signed)
Lab scheduled next week

## 2013-11-30 NOTE — Assessment & Plan Note (Signed)
Resolved

## 2013-11-30 NOTE — Assessment & Plan Note (Addendum)
lhronicLower extremity edema very well managed with current medication and exercise program.

## 2013-11-30 NOTE — Assessment & Plan Note (Signed)
Long-standing anxiety well managed with low-dose alprazolam. Continue dosing regularly at bedtime, discussed minimizing daytime dosing of this medication to situations that cause her significant anxiety.

## 2013-11-30 NOTE — Assessment & Plan Note (Signed)
Patient continues daily iron supplement, no sinus symptoms of worsening anemia. Repeat CBC as scheduled next week.

## 2013-12-01 ENCOUNTER — Other Ambulatory Visit: Payer: Self-pay | Admitting: Geriatric Medicine

## 2013-12-01 MED ORDER — HYDROCODONE-ACETAMINOPHEN 10-325 MG PO TABS: ORAL_TABLET | ORAL | Status: DC

## 2013-12-03 ENCOUNTER — Other Ambulatory Visit (HOSPITAL_COMMUNITY): Payer: Self-pay | Admitting: Interventional Radiology

## 2013-12-03 DIAGNOSIS — K559 Vascular disorder of intestine, unspecified: Secondary | ICD-10-CM

## 2013-12-08 DIAGNOSIS — D509 Iron deficiency anemia, unspecified: Secondary | ICD-10-CM | POA: Diagnosis not present

## 2013-12-08 LAB — CBC AND DIFFERENTIAL
HCT: 36 % (ref 36–46)
Hemoglobin: 11.9 g/dL — AB (ref 12.0–16.0)
Platelets: 195 10*3/uL (ref 150–399)

## 2013-12-09 ENCOUNTER — Telehealth: Payer: Self-pay

## 2013-12-09 DIAGNOSIS — H9110 Presbycusis, unspecified ear: Secondary | ICD-10-CM | POA: Diagnosis not present

## 2013-12-09 DIAGNOSIS — H9190 Unspecified hearing loss, unspecified ear: Secondary | ICD-10-CM | POA: Diagnosis not present

## 2013-12-09 NOTE — Telephone Encounter (Signed)
Left message on recorder, lab from 12/08/13 showed Hgb 11.9, better, per Claudette continue on iron supplement.

## 2013-12-14 ENCOUNTER — Encounter: Payer: Self-pay | Admitting: Internal Medicine

## 2013-12-17 ENCOUNTER — Other Ambulatory Visit: Payer: Self-pay | Admitting: Cardiology

## 2013-12-18 ENCOUNTER — Other Ambulatory Visit: Payer: Self-pay | Admitting: *Deleted

## 2013-12-18 MED ORDER — PREGABALIN 50 MG PO CAPS: ORAL_CAPSULE | ORAL | Status: DC

## 2013-12-29 ENCOUNTER — Encounter: Payer: Self-pay | Admitting: Internal Medicine

## 2013-12-29 ENCOUNTER — Telehealth: Payer: Self-pay | Admitting: Radiology

## 2013-12-29 NOTE — Telephone Encounter (Signed)
Left message requesting patient call for status update & to schedule follow up appointment with Dr Arne Cleveland.    Kendall Justo Etna Green, RN 12/29/2013 10:15 AM

## 2013-12-31 ENCOUNTER — Other Ambulatory Visit: Payer: Self-pay | Admitting: Geriatric Medicine

## 2013-12-31 MED ORDER — HYDROCODONE-ACETAMINOPHEN 10-325 MG PO TABS: ORAL_TABLET | ORAL | Status: DC

## 2014-01-05 ENCOUNTER — Inpatient Hospital Stay: Admission: RE | Admit: 2014-01-05 | Payer: Medicare Other | Source: Ambulatory Visit

## 2014-01-13 DIAGNOSIS — H903 Sensorineural hearing loss, bilateral: Secondary | ICD-10-CM | POA: Diagnosis not present

## 2014-01-13 DIAGNOSIS — H905 Unspecified sensorineural hearing loss: Secondary | ICD-10-CM | POA: Diagnosis not present

## 2014-01-15 ENCOUNTER — Other Ambulatory Visit: Payer: Self-pay | Admitting: Cardiology

## 2014-01-19 DIAGNOSIS — H3530 Unspecified macular degeneration: Secondary | ICD-10-CM | POA: Diagnosis not present

## 2014-01-19 DIAGNOSIS — H35369 Drusen (degenerative) of macula, unspecified eye: Secondary | ICD-10-CM | POA: Diagnosis not present

## 2014-01-28 ENCOUNTER — Other Ambulatory Visit: Payer: Self-pay | Admitting: Geriatric Medicine

## 2014-01-28 ENCOUNTER — Other Ambulatory Visit: Payer: Self-pay | Admitting: Cardiology

## 2014-01-28 ENCOUNTER — Other Ambulatory Visit: Payer: Self-pay | Admitting: Internal Medicine

## 2014-01-28 MED ORDER — HYDROCODONE-ACETAMINOPHEN 10-325 MG PO TABS: ORAL_TABLET | ORAL | Status: DC

## 2014-02-09 ENCOUNTER — Ambulatory Visit
Admission: RE | Admit: 2014-02-09 | Discharge: 2014-02-09 | Disposition: A | Discharge status: Home / Self Care | Payer: Medicare Other | Source: Ambulatory Visit | Attending: Interventional Radiology | Admitting: Interventional Radiology

## 2014-02-09 DIAGNOSIS — I748 Embolism and thrombosis of other arteries: Secondary | ICD-10-CM | POA: Diagnosis not present

## 2014-02-09 DIAGNOSIS — K559 Vascular disorder of intestine, unspecified: Secondary | ICD-10-CM

## 2014-02-09 DIAGNOSIS — Z09 Encounter for follow-up examination after completed treatment for conditions other than malignant neoplasm: Secondary | ICD-10-CM | POA: Diagnosis not present

## 2014-02-09 NOTE — Progress Notes (Signed)
Appetite:  Good.  Denies nausea, vomiting or diarrhea.  Occasionally experiences minimal bloating.  Denies pain.    Lives at [facility].  Only requires minimal assistance with bathing & dressing.    Ebbie Sorenson Riki Rusk, RN 02/09/2014 9:51 AM

## 2014-02-11 DIAGNOSIS — S0993XA Unspecified injury of face, initial encounter: Secondary | ICD-10-CM | POA: Diagnosis not present

## 2014-02-17 ENCOUNTER — Other Ambulatory Visit: Payer: Self-pay | Admitting: Cardiology

## 2014-02-17 ENCOUNTER — Other Ambulatory Visit: Payer: Self-pay | Admitting: *Deleted

## 2014-02-17 MED ORDER — PREGABALIN 50 MG PO CAPS: ORAL_CAPSULE | ORAL | Status: DC

## 2014-02-17 NOTE — Telephone Encounter (Signed)
[facility]  

## 2014-02-18 ENCOUNTER — Telehealth: Payer: Self-pay | Admitting: *Deleted

## 2014-02-18 NOTE — Telephone Encounter (Signed)
Dr. Nyoka Cowden received a call from Dr. Allen Kell office wanting to know if patient was on Osteoporosis medication due to Chronic abscess tooth that needs to be removed. Dr. Nyoka Cowden tried to contact patient and left her a message.  I called patient this morning and she stated that she was not taking a Osteoporosis medication and the last time she has taken one was Fosamax in 2010. Called and informed Dr. Flavia Shipper office and called and notified Dr. Nyoka Cowden.

## 2014-02-22 ENCOUNTER — Other Ambulatory Visit: Payer: Self-pay | Admitting: Internal Medicine

## 2014-03-15 ENCOUNTER — Encounter: Payer: Self-pay | Admitting: Internal Medicine

## 2014-03-15 ENCOUNTER — Non-Acute Institutional Stay: Payer: Medicare Other | Admitting: Internal Medicine

## 2014-03-15 VITALS — BP 126/84 | HR 64 | Temp 97.6°F | Resp 14 | Ht 66.0 in | Wt 143.8 lb

## 2014-03-15 DIAGNOSIS — D509 Iron deficiency anemia, unspecified: Secondary | ICD-10-CM | POA: Diagnosis not present

## 2014-03-15 DIAGNOSIS — I509 Heart failure, unspecified: Secondary | ICD-10-CM

## 2014-03-15 DIAGNOSIS — F411 Generalized anxiety disorder: Secondary | ICD-10-CM | POA: Diagnosis not present

## 2014-03-15 DIAGNOSIS — R609 Edema, unspecified: Secondary | ICD-10-CM

## 2014-03-15 DIAGNOSIS — I2510 Atherosclerotic heart disease of native coronary artery without angina pectoris: Secondary | ICD-10-CM | POA: Diagnosis not present

## 2014-03-15 DIAGNOSIS — N183 Chronic kidney disease, stage 3 unspecified: Secondary | ICD-10-CM | POA: Diagnosis not present

## 2014-03-15 DIAGNOSIS — I10 Essential (primary) hypertension: Secondary | ICD-10-CM

## 2014-03-15 DIAGNOSIS — E785 Hyperlipidemia, unspecified: Secondary | ICD-10-CM

## 2014-03-15 DIAGNOSIS — F419 Anxiety disorder, unspecified: Secondary | ICD-10-CM

## 2014-03-15 MED ORDER — HYDROCODONE-ACETAMINOPHEN 10-325 MG PO TABS: ORAL_TABLET | ORAL | Status: DC

## 2014-03-15 NOTE — Progress Notes (Signed)
Patient ID: Debra Brooks, female   DOB: 10-26-1925, 80 y.o.   MRN: [id number]    Location:  [facility]  Place of Service: CLINIC    Allergies  Allergen Reactions  . Iodinated Diagnostic Agents     Chief Complaint  Patient presents with  . Medical Management of Chronic Issues    To evaluate BP and Edema    HPI:  Anemia, iron deficiency: improved.  CHF (congestive heart failure): compensated  Chronic kidney disease (CKD), stage III (moderate): needs followup  CAD (coronary artery disease): no angina  Anxiety: improved on Bupropion  Edema: unchanged  Hyperlipemia: needs recheck  Hypertension: controlled    Medications: Patient's Medications  New Prescriptions   No medications on file  Previous Medications   ALPRAZOLAM (XANAX) 0.25 MG TABLET    Take 1 tablet (0.25 mg total) by mouth as directed. Take one tablet at bedtime to help sleep . May have additional 1 daily to treat anxiety   ASPIRIN 81 MG TABLET    Take 81 mg by mouth daily.     ATORVASTATIN (LIPITOR) 10 MG TABLET    TAKE 1 TABLET ONCE DAILY.   BUPROPION (WELLBUTRIN XL) 300 MG 24 HR TABLET    TAKE (1) TABLET DAILY IN THE MORNING.   CARVEDILOL (COREG) 12.5 MG TABLET    TAKE 1 TABLET TWICE DAILY WITH A MEAL FOR HEART/BLOOD PRESSURE.   DIGOX 125 MCG TABLET    TAKE 1 TABLET ONCE DAILY.   FERROUS GLUCONATE (FERGON) 325 MG TABLET    Take 325 mg by mouth daily with breakfast.   FUROSEMIDE (LASIX) 40 MG TABLET    TAKE ONE TABLET TWICE DAILY FOR SWELLING.   LORATADINE (CLARITIN) 10 MG TABLET    Take 1 tablet (10 mg total) by mouth as directed. Take 1 tablet twice daily for 5 days, then take 1 tablet daily until cough resolves   LOSARTAN (COZAAR) 50 MG TABLET    TAKE (1) TABLET DAILY FOR HIGH BLOOD PRESSURE.   MULTIPLE VITAMINS-MINERALS (ICAPS PO)    Take 1 capsule by mouth. Daily   NITROGLYCERIN (NITROSTAT) 0.4 MG SL TABLET    Place 0.4 mg under the tongue every 5 (five) minutes as needed for chest pain.    PANTOPRAZOLE  (PROTONIX) 40 MG TABLET    Take one tablet daily for stomach   POLYETHYLENE GLYCOL POWDER (GLYCOLAX/MIRALAX) POWDER    Take 17 g by mouth daily as needed. For constipation. Mix with 6oz. Beverage of choice   PREGABALIN (LYRICA) 50 MG CAPSULE    Take one capsule once daily to reduce neuropathic pain   SYNTHROID 75 MCG TABLET    TAKE 1 TABLET ONCE DAILY.   URSODIOL (ACTIGALL) 300 MG CAPSULE    Take 300 mg by mouth 2 (two) times daily.  Modified Medications   Modified Medication Previous Medication   HYDROCODONE-ACETAMINOPHEN (NORCO) 10-325 MG PER TABLET HYDROcodone-acetaminophen (NORCO) 10-325 MG per tablet      Take 1/2 tablet by mouth once daily for pain management.    Take 1/2 tablet twice a day for pain management.  Discontinued Medications   HYDROCODONE-ACETAMINOPHEN (NORCO) 10-325 MG PER TABLET    Take 1/2 tablet by mouth once daily for pain management.     Review of Systems  Constitutional: Negative for fever, chills, diaphoresis, activity change, appetite change and fatigue.  HENT: Negative.   Eyes: Negative for pain, discharge, redness and itching.  Respiratory: Negative for apnea, cough, choking and chest tightness.   Cardiovascular:  Positive for leg swelling. Negative for chest pain and palpitations.  Gastrointestinal: Negative for abdominal pain, abdominal distention and anal bleeding.  Endocrine: Negative.   Genitourinary: Negative.   Skin: Negative for pallor and rash.  Allergic/Immunologic: Positive for environmental allergies.  Neurological: Negative for dizziness, tremors, seizures, syncope, facial asymmetry, speech difficulty, weakness, light-headedness and headaches.  Hematological: Negative for adenopathy. Does not bruise/bleed easily.  Psychiatric/Behavioral: Positive for sleep disturbance. Negative for suicidal ideas, hallucinations, behavioral problems, confusion, self-injury and agitation. The patient is not nervous/anxious and is not hyperactive.     Filed Vitals:    03/15/14 1337  BP: 126/84  Pulse: 64  Temp: 97.6 F (36.4 C)  TempSrc: Oral  Resp: 14  Height: 5\' 6"  (1.676 m)  Weight: 143 lb 12.8 oz (65.227 kg)   Body mass index is 23.22 kg/(m^2).  Physical Exam  Constitutional: No distress.  HENT:  Head: Normocephalic and atraumatic.  Left Ear: External ear normal.  Eyes: Conjunctivae and EOM are normal.  Corrective lenses.  Neck: No JVD present. No tracheal deviation present. No thyromegaly present.  Cardiovascular: Normal rate, regular rhythm, normal heart sounds and intact distal pulses.  Exam reveals no gallop and no friction rub.   No murmur heard. Pulmonary/Chest: No respiratory distress. She has no wheezes. She has no rales.  Abdominal: She exhibits no distension and no mass. There is no tenderness. There is no rebound and no guarding.  Musculoskeletal: She exhibits edema (1+ bipedal. Weariing compressioin hose.). She exhibits no tenderness.  Lymphadenopathy:    She has no cervical adenopathy.  Neurological: She is alert. No cranial nerve deficit. Coordination normal.  Skin: No rash noted. No erythema. No pallor.  Psychiatric: She has a normal mood and affect. Her behavior is normal. Judgment and thought content normal.     Labs reviewed: Nursing Home on 03/15/2014  Component Date Value Ref Range Status  . Hemoglobin 12/08/2013 11.9* 12.0 - 16.0 g/dL Final  . HCT 12/08/2013 36  36 - 46 % Final  . Platelets 12/08/2013 195  150 - 399 K/L Final   12/08/13 RETIC 1.0%  SI 62  % Sat 20   Assessment/Plan  1. Anemia, iron deficiency Stop iron  2. CHF (congestive heart failure) compensated  3. Chronic kidney disease (CKD), stage III (moderate) F/U next visit  4. CAD (coronary artery disease) stable  5. Anxiety improved  6. Edema unchanged  7. Hyperlipemia Recheck next visit  8. Hypertension controlled

## 2014-03-23 ENCOUNTER — Other Ambulatory Visit: Payer: Self-pay | Admitting: Cardiology

## 2014-04-02 ENCOUNTER — Other Ambulatory Visit: Payer: Self-pay | Admitting: *Deleted

## 2014-04-02 MED ORDER — HYDROCODONE-ACETAMINOPHEN 10-325 MG PO TABS: ORAL_TABLET | ORAL | Status: DC

## 2014-04-05 ENCOUNTER — Other Ambulatory Visit: Payer: Self-pay | Admitting: Cardiology

## 2014-04-06 DIAGNOSIS — K8020 Calculus of gallbladder without cholecystitis without obstruction: Secondary | ICD-10-CM | POA: Diagnosis not present

## 2014-04-06 DIAGNOSIS — K219 Gastro-esophageal reflux disease without esophagitis: Secondary | ICD-10-CM | POA: Diagnosis not present

## 2014-04-08 ENCOUNTER — Ambulatory Visit (INDEPENDENT_AMBULATORY_CARE_PROVIDER_SITE_OTHER): Payer: Medicare Other | Admitting: Cardiology

## 2014-04-08 ENCOUNTER — Encounter: Payer: Self-pay | Admitting: Cardiology

## 2014-04-08 ENCOUNTER — Encounter: Payer: Self-pay | Admitting: *Deleted

## 2014-04-08 VITALS — BP 122/58 | HR 52 | Ht 66.0 in | Wt 145.0 lb

## 2014-04-08 DIAGNOSIS — I2510 Atherosclerotic heart disease of native coronary artery without angina pectoris: Secondary | ICD-10-CM

## 2014-04-08 DIAGNOSIS — I6529 Occlusion and stenosis of unspecified carotid artery: Secondary | ICD-10-CM

## 2014-04-08 DIAGNOSIS — K559 Vascular disorder of intestine, unspecified: Secondary | ICD-10-CM

## 2014-04-08 DIAGNOSIS — I4891 Unspecified atrial fibrillation: Secondary | ICD-10-CM | POA: Diagnosis not present

## 2014-04-08 DIAGNOSIS — E785 Hyperlipidemia, unspecified: Secondary | ICD-10-CM

## 2014-04-08 DIAGNOSIS — N183 Chronic kidney disease, stage 3 unspecified: Secondary | ICD-10-CM

## 2014-04-08 MED ORDER — NITROGLYCERIN 0.4 MG SL SUBL: 0.4000 mg | SUBLINGUAL_TABLET | SUBLINGUAL | Status: DC | PRN

## 2014-04-08 NOTE — Patient Instructions (Signed)
Stop Digoxin.  Your physician recommends that you have  lab work today--BMET/Lipid profile.  Your physician has requested that you have a carotid duplex. This test is an ultrasound of the carotid arteries in your neck. It looks at blood flow through these arteries that supply the brain with blood. Allow one hour for this exam. There are no restrictions or special instructions.  Your physician wants you to follow-up in: 6 months with Dr Aundra Dubin. (December 2015). You will receive a reminder letter in the mail two months in advance. If you don't receive a letter, please call our office to schedule the follow-up appointment.

## 2014-04-09 LAB — BASIC METABOLIC PANEL
BUN: 24 mg/dL — ABNORMAL HIGH (ref 6–23)
CALCIUM: 9.7 mg/dL (ref 8.4–10.5)
CO2: 31 meq/L (ref 19–32)
Chloride: 104 mEq/L (ref 96–112)
Creatinine, Ser: 1.2 mg/dL (ref 0.4–1.2)
GFR: 46.5 mL/min — AB (ref >60.00)
Glucose, Bld: 126 mg/dL — ABNORMAL HIGH (ref 70–99)
Potassium: 4.9 mEq/L (ref 3.5–5.1)
SODIUM: 141 meq/L (ref 135–145)

## 2014-04-09 LAB — LIPID PANEL
Cholesterol: 141 mg/dL (ref 0–200)
HDL: 62.2 mg/dL (ref >39.00)
LDL CALC: 48 mg/dL (ref 0–99)
NonHDL: 78.8
Total CHOL/HDL Ratio: 2
Triglycerides: 153 mg/dL — ABNORMAL HIGH (ref 0.0–149.0)
VLDL: 30.6 mg/dL (ref 0.0–40.0)
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
## 2014-04-19 ENCOUNTER — Other Ambulatory Visit: Payer: Self-pay | Admitting: Internal Medicine

## 2014-04-19 ENCOUNTER — Other Ambulatory Visit: Payer: Self-pay | Admitting: Cardiology

## 2014-04-20 ENCOUNTER — Other Ambulatory Visit: Payer: Self-pay | Admitting: Nurse Practitioner

## 2014-04-20 ENCOUNTER — Other Ambulatory Visit: Payer: Self-pay | Admitting: *Deleted

## 2014-04-20 ENCOUNTER — Ambulatory Visit (HOSPITAL_COMMUNITY): Payer: Medicare Other | Attending: Cardiology | Admitting: Cardiology

## 2014-04-20 DIAGNOSIS — I10 Essential (primary) hypertension: Secondary | ICD-10-CM | POA: Insufficient documentation

## 2014-04-20 DIAGNOSIS — I658 Occlusion and stenosis of other precerebral arteries: Secondary | ICD-10-CM | POA: Diagnosis not present

## 2014-04-20 DIAGNOSIS — I4891 Unspecified atrial fibrillation: Secondary | ICD-10-CM

## 2014-04-20 DIAGNOSIS — I6529 Occlusion and stenosis of unspecified carotid artery: Secondary | ICD-10-CM | POA: Insufficient documentation

## 2014-04-20 DIAGNOSIS — E785 Hyperlipidemia, unspecified: Secondary | ICD-10-CM | POA: Diagnosis not present

## 2014-04-20 DIAGNOSIS — I2510 Atherosclerotic heart disease of native coronary artery without angina pectoris: Secondary | ICD-10-CM | POA: Insufficient documentation

## 2014-04-20 MED ORDER — PREGABALIN 50 MG PO CAPS: ORAL_CAPSULE | ORAL | Status: DC

## 2014-04-20 NOTE — Progress Notes (Signed)
Carotid duplex completed 

## 2014-04-20 NOTE — Telephone Encounter (Signed)
[facility]  

## 2014-04-28 ENCOUNTER — Telehealth: Payer: Self-pay | Admitting: Cardiology

## 2014-04-28 NOTE — Telephone Encounter (Signed)
Ok thanks.  Did she feel worse off it?

## 2014-04-28 NOTE — Telephone Encounter (Signed)
New problem   FYI: pt has started back taken Digoxin.

## 2014-04-28 NOTE — Telephone Encounter (Signed)
Returned call to [place] at [facility] she stated patient started back on digoxin 0.125 mg daily this morning.Stated Dr.Mclean wanted to know when she restarted digoxin.Message sent to [place].

## 2014-04-28 NOTE — Telephone Encounter (Signed)
Patient did not feel worse.Patient getting ready to go on vacation.

## 2014-05-04 ENCOUNTER — Other Ambulatory Visit: Payer: Self-pay

## 2014-05-04 ENCOUNTER — Other Ambulatory Visit: Payer: Self-pay | Admitting: Cardiology

## 2014-05-04 ENCOUNTER — Other Ambulatory Visit: Payer: Self-pay | Admitting: Internal Medicine

## 2014-05-04 MED ORDER — ALPRAZOLAM 0.25 MG PO TABS: ORAL_TABLET | ORAL | Status: DC

## 2014-05-04 MED ORDER — BUPROPION HCL ER (XL) 300 MG PO TB24: ORAL_TABLET | ORAL | Status: DC

## 2014-05-04 MED ORDER — HYDROCODONE-ACETAMINOPHEN 10-325 MG PO TABS: ORAL_TABLET | ORAL | Status: DC

## 2014-05-04 NOTE — Telephone Encounter (Signed)
Patient called indicating she is due for refill on hydrocodone. Patient would like for Debbie ([place]) to bring rx to [facility]. Jackelyn Poling is aware

## 2014-05-25 DIAGNOSIS — H35369 Drusen (degenerative) of macula, unspecified eye: Secondary | ICD-10-CM | POA: Diagnosis not present

## 2014-05-25 DIAGNOSIS — H3530 Unspecified macular degeneration: Secondary | ICD-10-CM | POA: Diagnosis not present

## 2014-05-27 ENCOUNTER — Other Ambulatory Visit: Payer: Self-pay | Admitting: Cardiology

## 2014-05-28 DIAGNOSIS — L940 Localized scleroderma [morphea]: Secondary | ICD-10-CM | POA: Diagnosis not present

## 2014-05-28 DIAGNOSIS — L723 Sebaceous cyst: Secondary | ICD-10-CM | POA: Diagnosis not present

## 2014-05-28 DIAGNOSIS — L821 Other seborrheic keratosis: Secondary | ICD-10-CM | POA: Diagnosis not present

## 2014-05-28 DIAGNOSIS — L819 Disorder of pigmentation, unspecified: Secondary | ICD-10-CM | POA: Diagnosis not present

## 2014-05-28 DIAGNOSIS — D1801 Hemangioma of skin and subcutaneous tissue: Secondary | ICD-10-CM | POA: Diagnosis not present

## 2014-06-08 ENCOUNTER — Other Ambulatory Visit: Payer: Self-pay | Admitting: Internal Medicine

## 2014-06-08 ENCOUNTER — Other Ambulatory Visit: Payer: Self-pay | Admitting: *Deleted

## 2014-06-08 MED ORDER — PREGABALIN 50 MG PO CAPS: ORAL_CAPSULE | ORAL | Status: DC

## 2014-06-08 NOTE — Telephone Encounter (Signed)
[facility]  

## 2014-06-09 ENCOUNTER — Other Ambulatory Visit: Payer: Self-pay

## 2014-06-09 MED ORDER — HYDROCODONE-ACETAMINOPHEN 10-325 MG PO TABS: ORAL_TABLET | ORAL | Status: DC

## 2014-07-06 ENCOUNTER — Other Ambulatory Visit: Payer: Self-pay | Admitting: *Deleted

## 2014-07-06 MED ORDER — ALPRAZOLAM 0.25 MG PO TABS: ORAL_TABLET | ORAL | Status: DC

## 2014-07-07 ENCOUNTER — Other Ambulatory Visit: Payer: Self-pay

## 2014-07-07 MED ORDER — HYDROCODONE-ACETAMINOPHEN 10-325 MG PO TABS: ORAL_TABLET | ORAL | Status: DC

## 2014-07-08 DIAGNOSIS — E785 Hyperlipidemia, unspecified: Secondary | ICD-10-CM | POA: Diagnosis not present

## 2014-07-08 DIAGNOSIS — D509 Iron deficiency anemia, unspecified: Secondary | ICD-10-CM | POA: Diagnosis not present

## 2014-07-08 DIAGNOSIS — R609 Edema, unspecified: Secondary | ICD-10-CM | POA: Diagnosis not present

## 2014-07-08 DIAGNOSIS — I10 Essential (primary) hypertension: Secondary | ICD-10-CM | POA: Diagnosis not present

## 2014-07-08 LAB — BASIC METABOLIC PANEL
BUN: 25 mg/dL — AB (ref 4–21)
Creatinine: 1.3 mg/dL — AB (ref 0.5–1.1)
GLUCOSE: 66 mg/dL
Potassium: 4 mmol/L (ref 3.4–5.3)
SODIUM: 139 mmol/L (ref 137–147)

## 2014-07-08 LAB — LIPID PANEL
CHOLESTEROL: 127 mg/dL (ref 0–200)
HDL: 55 mg/dL (ref 35–70)
LDL CALC: 65 mg/dL
LDl/HDL Ratio: 1.2
Triglycerides: 158 mg/dL (ref 40–160)

## 2014-07-08 LAB — CBC AND DIFFERENTIAL
HEMATOCRIT: 32 % — AB (ref 36–46)
HEMOGLOBIN: 10.5 g/dL — AB (ref 12.0–16.0)
PLATELETS: 222 10*3/uL (ref 150–399)
WBC: 5.9 10^3/mL

## 2014-07-09 NOTE — Progress Notes (Signed)
Patient ID: Debra Brooks, female   DOB: 1925/06/02, 80 y.o.   MRN: [id number] Open in error

## 2014-07-19 ENCOUNTER — Encounter: Payer: Self-pay | Admitting: Internal Medicine

## 2014-07-26 ENCOUNTER — Encounter: Payer: Self-pay | Admitting: Internal Medicine

## 2014-07-26 ENCOUNTER — Non-Acute Institutional Stay: Payer: Medicare Other | Admitting: Internal Medicine

## 2014-07-26 VITALS — BP 120/62 | HR 67 | Wt 145.0 lb

## 2014-07-26 DIAGNOSIS — I5032 Chronic diastolic (congestive) heart failure: Secondary | ICD-10-CM

## 2014-07-26 DIAGNOSIS — IMO0002 Reserved for concepts with insufficient information to code with codable children: Secondary | ICD-10-CM

## 2014-07-26 DIAGNOSIS — N183 Chronic kidney disease, stage 3 unspecified: Secondary | ICD-10-CM

## 2014-07-26 DIAGNOSIS — K8020 Calculus of gallbladder without cholecystitis without obstruction: Secondary | ICD-10-CM

## 2014-07-26 DIAGNOSIS — N838 Other noninflammatory disorders of ovary, fallopian tube and broad ligament: Secondary | ICD-10-CM

## 2014-07-26 DIAGNOSIS — F411 Generalized anxiety disorder: Secondary | ICD-10-CM

## 2014-07-26 DIAGNOSIS — D509 Iron deficiency anemia, unspecified: Secondary | ICD-10-CM | POA: Diagnosis not present

## 2014-07-26 DIAGNOSIS — E785 Hyperlipidemia, unspecified: Secondary | ICD-10-CM

## 2014-07-26 DIAGNOSIS — F419 Anxiety disorder, unspecified: Secondary | ICD-10-CM

## 2014-07-26 DIAGNOSIS — I509 Heart failure, unspecified: Secondary | ICD-10-CM

## 2014-07-26 DIAGNOSIS — I2510 Atherosclerotic heart disease of native coronary artery without angina pectoris: Secondary | ICD-10-CM

## 2014-07-26 DIAGNOSIS — R609 Edema, unspecified: Secondary | ICD-10-CM

## 2014-07-26 DIAGNOSIS — N839 Noninflammatory disorder of ovary, fallopian tube and broad ligament, unspecified: Secondary | ICD-10-CM

## 2014-07-26 DIAGNOSIS — I10 Essential (primary) hypertension: Secondary | ICD-10-CM

## 2014-07-26 DIAGNOSIS — M1710 Unilateral primary osteoarthritis, unspecified knee: Secondary | ICD-10-CM

## 2014-07-26 MED ORDER — BUPROPION HCL ER (XL) 300 MG PO TB24: ORAL_TABLET | ORAL | Status: DC

## 2014-07-26 MED ORDER — HYDROCODONE-ACETAMINOPHEN 10-325 MG PO TABS: ORAL_TABLET | ORAL | Status: DC

## 2014-07-26 NOTE — Progress Notes (Signed)
Patient ID: Debra Brooks, female   DOB: [birth year], 80 y.o.   MRN: [id number]    Location: [facility] (12)    Allergies  Allergen Reactions  . Iodinated Diagnostic Agents     Chief Complaint  Patient presents with  . Medical Management of Chronic Issues     CHF, CKD, blood pressure, anemia, anxiety, cholesterol    HPI:  Coronary artery disease involving native coronary artery of native heart without angina pectoris: stable. Denies chest pain or palpitations  Anemia, iron deficiency: hgb fell to 10.5 from 43.1  Chronic diastolic congestive heart failure: compensated  Calculus of gallbladder without cholecystitis without obstruction: remains on ursodiol at recommendation of Dr. Cristina Gong  Chronic kidney disease (CKD), stage III (moderate): improved  DEGENERATIVE JOINT DISEASE, KNEES, BILATERAL: hurts all over  Edema: unchanged. Wearing compression hosiery  Hyperlipemia: controlled  Essential hypertension: controlled  Ovarian mass: needs pelvic exam next visit    Medications: Patient's Medications  New Prescriptions   No medications on file  Previous Medications   ALPRAZOLAM (XANAX) 0.25 MG TABLET    Take one tablet at bedtime to help sleep . May have additional 1 daily to treat anxiety   ASPIRIN 81 MG TABLET    Take 81 mg by mouth daily.     ATORVASTATIN (LIPITOR) 10 MG TABLET    TAKE 1 TABLET ONCE DAILY.   BUPROPION (WELLBUTRIN XL) 300 MG 24 HR TABLET    TAKE (1) TABLET DAILY IN THE MORNING.   CARVEDILOL (COREG) 12.5 MG TABLET    TAKE 1 TABLET TWICE DAILY WITH A MEAL FOR HEART/BLOOD PRESSURE.   DIGOX 125 MCG TABLET    TAKE 1 TABLET ONCE DAILY.   HYDROCODONE-ACETAMINOPHEN (NORCO) 10-325 MG PER TABLET    Take 1/2 tablet by mouth twice daily for pain management.   LORATADINE (CLARITIN) 10 MG TABLET    Take 1 tablet (10 mg total) by mouth as directed. Take 1 tablet twice daily for 5 days, then take 1 tablet daily until  cough resolves   LOSARTAN (COZAAR) 50 MG TABLET    TAKE (1) TABLET DAILY FOR HIGH BLOOD PRESSURE.   MULTIPLE VITAMINS-MINERALS (ICAPS PO)    Take 1 capsule by mouth. Daily   NITROGLYCERIN (NITROSTAT) 0.4 MG SL TABLET    Place 1 tablet (0.4 mg total) under the tongue every 5 (five) minutes as needed for chest pain.   PANTOPRAZOLE (PROTONIX) 40 MG TABLET    Take one tablet daily for stomach   POLYETHYLENE GLYCOL POWDER (GLYCOLAX/MIRALAX) POWDER    Take 17 g by mouth daily as needed. For constipation. Mix with 6oz. Beverage of choice   PREGABALIN (LYRICA) 50 MG CAPSULE    Take one capsule by mouth once daily to reduce neuropathic pain   SYNTHROID 75 MCG TABLET    TAKE 1 TABLET ONCE DAILY.   URSODIOL (ACTIGALL) 300 MG CAPSULE    Take 300 mg by mouth 2 (two) times daily.  Modified Medications   Modified Medication Previous Medication   FUROSEMIDE (LASIX) 40 MG TABLET furosemide (LASIX) 40 MG tablet      TAKE ONE TABLET  DAILY FOR SWELLING.    TAKE ONE TABLET TWICE DAILY FOR SWELLING.  Discontinued Medications   BUPROPION (WELLBUTRIN XL) 300 MG 24 HR TABLET    TAKE (1) TABLET DAILY IN THE MORNING.     Review of Systems  Constitutional: Negative for fever, chills, diaphoresis, activity change, appetite change and fatigue.  HENT: Negative.   Eyes: Negative for pain, discharge, redness and itching.  Respiratory: Negative for apnea, cough, choking and chest tightness.   Cardiovascular: Positive for leg swelling. Negative for chest pain and palpitations.  Gastrointestinal: Negative for abdominal pain, abdominal distention and anal bleeding.  Endocrine: Negative.   Genitourinary: Negative.   Skin: Negative for pallor and rash.  Allergic/Immunologic: Positive for environmental allergies.  Neurological: Negative for dizziness, tremors, seizures, syncope, facial asymmetry, speech difficulty, weakness, light-headedness and headaches.  Hematological: Negative for adenopathy. Does not bruise/bleed  easily.  Psychiatric/Behavioral: Positive for sleep disturbance. Negative for suicidal ideas, hallucinations, behavioral problems, confusion, self-injury and agitation. The patient is not nervous/anxious and is not hyperactive.     Filed Vitals:   07/26/14 1455  BP: 120/62  Pulse: 67  Weight: 145 lb (65.772 kg)  SpO2: 96%   Body mass index is 23.41 kg/(m^2).  Physical Exam  Constitutional: No distress.  HENT:  Head: Normocephalic and atraumatic.  Left Ear: External ear normal.  Eyes: Conjunctivae and EOM are normal.  Corrective lenses.  Neck: No JVD present. No tracheal deviation present. No thyromegaly present.  Cardiovascular: Normal rate, regular rhythm, normal heart sounds and intact distal pulses.  Exam reveals no gallop and no friction rub.   No murmur heard. Pulmonary/Chest: No respiratory distress. She has no wheezes. She has no rales.  Abdominal: She exhibits no distension and no mass. There is no tenderness. There is no rebound and no guarding.  Musculoskeletal: She exhibits edema (1+ bipedal. Weariing compressioin hose.). She exhibits no tenderness.  Lymphadenopathy:    She has no cervical adenopathy.  Neurological: She is alert. No cranial nerve deficit. Coordination normal.  Skin: No rash noted. No erythema. No pallor.  Psychiatric: She has a normal mood and affect. Her behavior is normal. Judgment and thought content normal.     Labs reviewed: Nursing Home on 07/26/2014  Component Date Value Ref Range Status  . Hemoglobin 07/08/2014 10.5* 12.0 - 16.0 g/dL Final  . HCT 07/08/2014 32* 36 - 46 % Final  . Platelets 07/08/2014 222  150 - 399 K/L Final  . WBC 07/08/2014 5.9   Final  . Glucose 07/08/2014 66   Final  . BUN 07/08/2014 25* 4 - 21 mg/dL Final  . Creatinine 07/08/2014 1.3* 0.5 - 1.1 mg/dL Final  . Potassium 07/08/2014 4.0  3.4 - 5.3 mmol/L Final  . Sodium 07/08/2014 139  137 - 147 mmol/L Final  . LDl/HDL Ratio 07/08/2014 1.2   Final  . Triglycerides  07/08/2014 158  40 - 160 mg/dL Final  . Cholesterol 07/08/2014 127  0 - 200 mg/dL Final  . HDL 07/08/2014 55  35 - 70 mg/dL Final  . LDL Cholesterol 07/08/2014 65   Final     Assessment/Plan  1. Coronary artery disease involving native coronary artery of native heart without angina pectoris stable  2. Anemia, iron deficienstableRecheck next visit  3. Chronic diastolic congestive heart failure compensated -DC Digoxin  4. Calculus of gallbladder without cholecystitis without obstruction Remain on ursodiol  5. Chronic kidney disease (CKD), stage III (moderate) stable  6. DEGENERATIVE JOINT DISEASE, KNEES, BILATERAL Unchanged -she is tapering off HC/ APAP  7. Edema unchanged  8. Hyperlipemia Controlled  9. Depression and anxiety -Taper off Wellbutrin. Reduced dose to 150 mg  9. Essential hypertension controlled  10. Ovarian mass Recheck pelvic next visit   DC [place]

## 2014-07-28 ENCOUNTER — Other Ambulatory Visit: Payer: Self-pay | Admitting: *Deleted

## 2014-08-02 ENCOUNTER — Encounter: Payer: Self-pay | Admitting: Internal Medicine

## 2014-08-03 ENCOUNTER — Other Ambulatory Visit: Payer: Self-pay | Admitting: Internal Medicine

## 2014-08-03 NOTE — Telephone Encounter (Signed)
[employer]

## 2014-08-06 ENCOUNTER — Other Ambulatory Visit: Payer: Self-pay | Admitting: *Deleted

## 2014-08-06 MED ORDER — PREGABALIN 50 MG PO CAPS: ORAL_CAPSULE | ORAL | Status: DC

## 2014-08-06 NOTE — Telephone Encounter (Signed)
[place] 

## 2014-08-13 ENCOUNTER — Other Ambulatory Visit: Payer: Self-pay

## 2014-08-13 DIAGNOSIS — Z23 Encounter for immunization: Secondary | ICD-10-CM | POA: Diagnosis not present

## 2014-08-24 DIAGNOSIS — D649 Anemia, unspecified: Secondary | ICD-10-CM | POA: Diagnosis not present

## 2014-08-24 LAB — CBC AND DIFFERENTIAL
HCT: 31 % — AB (ref 36–46)
Hemoglobin: 10.2 g/dL — AB (ref 12.0–16.0)
Platelets: 239 10*3/uL (ref 150–399)
WBC: 9.1 10^3/mL

## 2014-08-25 ENCOUNTER — Telehealth: Payer: Self-pay

## 2014-08-25 NOTE — Telephone Encounter (Signed)
Called patient to let her know CBC is stable.

## 2014-09-13 ENCOUNTER — Other Ambulatory Visit: Payer: Self-pay | Admitting: Internal Medicine

## 2014-09-15 DIAGNOSIS — Z299 Encounter for prophylactic measures, unspecified: Secondary | ICD-10-CM

## 2014-10-06 ENCOUNTER — Ambulatory Visit (INDEPENDENT_AMBULATORY_CARE_PROVIDER_SITE_OTHER): Payer: Medicare Other | Admitting: Nurse Practitioner

## 2014-10-06 VITALS — BP 100/62 | HR 53 | Ht 65.5 in | Wt 148.8 lb

## 2014-10-06 DIAGNOSIS — I4892 Unspecified atrial flutter: Secondary | ICD-10-CM | POA: Diagnosis not present

## 2014-10-06 DIAGNOSIS — I6523 Occlusion and stenosis of bilateral carotid arteries: Secondary | ICD-10-CM

## 2014-10-06 DIAGNOSIS — I2510 Atherosclerotic heart disease of native coronary artery without angina pectoris: Secondary | ICD-10-CM

## 2014-10-06 DIAGNOSIS — I5032 Chronic diastolic (congestive) heart failure: Secondary | ICD-10-CM

## 2014-10-06 LAB — BASIC METABOLIC PANEL
BUN: 29 mg/dL — ABNORMAL HIGH (ref 6–23)
CO2: 30 mEq/L (ref 19–32)
Calcium: 9.2 mg/dL (ref 8.4–10.5)
Chloride: 100 mEq/L (ref 96–112)
Creatinine, Ser: 1.3 mg/dL — ABNORMAL HIGH (ref 0.4–1.2)
GFR: 43.04 mL/min — ABNORMAL LOW (ref >60.00)
Glucose, Bld: 94 mg/dL (ref 70–99)
Potassium: 3.8 mEq/L (ref 3.5–5.1)
Sodium: 137 mEq/L (ref 135–145)

## 2014-10-06 LAB — CBC
HCT: 31.1 % — ABNORMAL LOW (ref 36.0–46.0)
Hemoglobin: 9.9 g/dL — ABNORMAL LOW (ref 12.0–15.0)
MCHC: 31.8 g/dL (ref 30.0–36.0)
MCV: 89.6 fl (ref 78.0–100.0)
Platelets: 216 10*3/uL (ref 150.0–400.0)
RBC: 3.47 Mil/uL — ABNORMAL LOW (ref 3.87–5.11)
RDW: 15 % (ref 11.5–15.5)
WBC: 7.3 10*3/uL (ref 4.0–10.5)

## 2014-10-06 NOTE — Patient Instructions (Signed)
We will be checking the following labs today BMET and CBC  Stay on your current medicines  I will see you in 6 months  Keep up the good work!!  Call the [place] office at [phone] if you have any questions, problems or concerns.
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
## 2014-10-07 ENCOUNTER — Other Ambulatory Visit: Payer: Self-pay | Admitting: Cardiology

## 2014-10-08 ENCOUNTER — Other Ambulatory Visit: Payer: Self-pay | Admitting: *Deleted

## 2014-10-08 DIAGNOSIS — R710 Precipitous drop in hematocrit: Secondary | ICD-10-CM

## 2014-11-02 ENCOUNTER — Encounter: Payer: Self-pay | Admitting: Internal Medicine

## 2014-11-02 ENCOUNTER — Non-Acute Institutional Stay: Payer: Medicare Other | Admitting: Internal Medicine

## 2014-11-02 VITALS — BP 106/62 | HR 69 | Temp 97.5°F | Wt 149.0 lb

## 2014-11-02 DIAGNOSIS — J209 Acute bronchitis, unspecified: Secondary | ICD-10-CM | POA: Diagnosis not present

## 2014-11-02 MED ORDER — DOXYCYCLINE HYCLATE 100 MG PO TABS: 100.0000 mg | ORAL_TABLET | Freq: Two times a day (BID) | ORAL | Status: DC

## 2014-11-02 MED ORDER — IPRATROPIUM-ALBUTEROL 0.5-2.5 (3) MG/3ML IN SOLN: 3.0000 mL | Freq: Four times a day (QID) | RESPIRATORY_TRACT | Status: DC | PRN

## 2014-11-02 MED ORDER — PREDNISONE 10 MG PO TABS: ORAL_TABLET | ORAL | Status: DC
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
## 2014-11-03 ENCOUNTER — Other Ambulatory Visit: Payer: Self-pay | Admitting: Internal Medicine

## 2014-11-03 ENCOUNTER — Other Ambulatory Visit: Payer: Self-pay | Admitting: Cardiology

## 2014-11-03 ENCOUNTER — Telehealth: Payer: Self-pay

## 2014-11-03 DIAGNOSIS — R05 Cough: Secondary | ICD-10-CM | POA: Diagnosis not present

## 2014-11-03 NOTE — Telephone Encounter (Signed)
Called patient with 11/03/14 chest x-ray results, bilateral pneumonia. Per Dr. Mariea Clonts, was started on doxycycline and prednisone on 11/02/14. If she is not getting better by Friday, we may change to Avelox or Levaquin. She is feeling a little better. Told patient to drink lots of fluids, hot tea with honey will be fine. She went over what she is eating (chicken soup, jello, baked potatoe) she doesn't have much appetite. Told her some times the prednisone increases appetite and some times it makes you feel a little hyper. If she doesn't feel any better to call the office or [name] [facility] clinic nurse). Keep appt for lab tomorrow and Dr. Mariea Clonts appt 11/09/14. Patient agreed.

## 2014-11-04 DIAGNOSIS — J209 Acute bronchitis, unspecified: Secondary | ICD-10-CM | POA: Diagnosis not present

## 2014-11-04 DIAGNOSIS — I10 Essential (primary) hypertension: Secondary | ICD-10-CM | POA: Diagnosis not present

## 2014-11-04 DIAGNOSIS — R5383 Other fatigue: Secondary | ICD-10-CM | POA: Diagnosis not present

## 2014-11-04 LAB — BASIC METABOLIC PANEL
BUN: 36 mg/dL — AB (ref 4–21)
Creatinine: 1.2 mg/dL — AB (ref 0.5–1.1)
Glucose: 87 mg/dL
Potassium: 4.1 mmol/L (ref 3.4–5.3)
SODIUM: 137 mmol/L (ref 137–147)

## 2014-11-04 LAB — CBC AND DIFFERENTIAL
HCT: 28 % — AB (ref 36–46)
HEMOGLOBIN: 9.5 g/dL — AB (ref 12.0–16.0)
Platelets: 320 10*3/uL (ref 150–399)
WBC: 15.4 10^3/mL

## 2014-11-04 NOTE — Telephone Encounter (Signed)
Debra Brooks with [facility] called and stated that you told patient that if she was no better to let you know and she is calling and stating she is no better and needs something else called in. Please Advise.

## 2014-11-05 ENCOUNTER — Encounter: Payer: Self-pay | Admitting: Internal Medicine

## 2014-11-05 ENCOUNTER — Telehealth: Payer: Self-pay | Admitting: *Deleted

## 2014-11-05 MED ORDER — LEVOFLOXACIN 500 MG PO TABS: ORAL_TABLET | ORAL | Status: DC

## 2014-11-05 NOTE — Telephone Encounter (Signed)
[facility] Size     Small Medium Large Extra Extra Large    JALESA THIEN  11/03/2014  Telephone  MRN:  [id number]   Description: 81 year old female  Provider: Ripley Fraise, Elliott  Department: [place]       Reason for Call     Results    Chest x-ray Pneumonia         Call Documentation      Albertina Senegal May, CMA at 11/04/2014 3:35 PM     Status: Signed       Expand All Collapse All   Bernadette with [facility] called and stated that you told patient that if she was no better to let you know and she is calling and stating she is no better and needs something else called in. Please Advise.            Ripley Fraise, CMA at 11/03/2014 2:09 PM     Status: Signed       Expand All Collapse All   Called patient with 11/03/14 chest x-ray results, bilateral pneumonia. Per Dr. Mariea Clonts, was started on doxycycline and prednisone on 11/02/14. If she is not getting better by Friday, we may change to Avelox or Levaquin. She is feeling a little better. Told patient to drink lots of fluids, hot tea with honey will be fine. She went over what she is eating (chicken soup, jello, baked potatoe) she doesn't have much appetite. Told her some times the prednisone increases appetite and some times it makes you feel a little hyper. If she doesn't feel any better to call the office or [name] [facility] clinic nurse). Keep appt for lab tomorrow and Dr. Mariea Clonts appt 11/09/14. Patient agreed.              Encounter MyChart Messages     No messages in this encounter                           Created by     Ripley Fraise, CMA on 11/03/2014 02:07 PM

## 2014-11-05 NOTE — Telephone Encounter (Signed)
Per Dr. Allison Quarry fax in to [facility] 500mg  by mouth daily for 10 days. Continue yogurt daily. Push by mouth fluids (kidney function decreased slightly) especially water. Keep follow up appointment. Bernedette at [facility] #: [phone] and she will do home visit with patient to check on her and explain Abstracted labwork received into patient's chart [facility] labs dated 11/04/2014) Dr. Mariea Clonts signed off on. Sent to be scanned.

## 2014-11-05 NOTE — Telephone Encounter (Signed)
Debra Brooks with [facility] called and stated that she has explained directions to patient regarding antibiotics. Patient told her that now she is having Right sided pain right above the flank and wanted you to know this. Please Advise.

## 2014-11-05 NOTE — Progress Notes (Signed)
This encounter was created in error - please disregard.

## 2014-11-05 NOTE — Telephone Encounter (Signed)
This is probably due to her coughing affecting the muscles between her ribs or could also be pleurisy from her pneumonia.  Putting heat over the affected area may give her some relief.

## 2014-11-05 NOTE — Telephone Encounter (Signed)
Bernadette with [facility] Notified and will instruct patient.

## 2014-11-06 ENCOUNTER — Emergency Department (HOSPITAL_COMMUNITY): Payer: Medicare Other

## 2014-11-06 ENCOUNTER — Encounter (HOSPITAL_COMMUNITY): Payer: Self-pay | Admitting: *Deleted

## 2014-11-06 ENCOUNTER — Inpatient Hospital Stay (HOSPITAL_COMMUNITY)
Admission: EM | Admit: 2014-11-06 | Discharge: 2014-11-11 | DRG: 193 | Disposition: A | Discharge status: Skilled Nursing Facility | Payer: Medicare Other | Attending: Internal Medicine | Admitting: Internal Medicine

## 2014-11-06 DIAGNOSIS — R05 Cough: Secondary | ICD-10-CM | POA: Diagnosis not present

## 2014-11-06 DIAGNOSIS — N183 Chronic kidney disease, stage 3 unspecified: Secondary | ICD-10-CM | POA: Diagnosis present

## 2014-11-06 DIAGNOSIS — J9600 Acute respiratory failure, unspecified whether with hypoxia or hypercapnia: Secondary | ICD-10-CM | POA: Diagnosis present

## 2014-11-06 DIAGNOSIS — N179 Acute kidney failure, unspecified: Secondary | ICD-10-CM | POA: Diagnosis present

## 2014-11-06 DIAGNOSIS — Z888 Allergy status to other drugs, medicaments and biological substances status: Secondary | ICD-10-CM

## 2014-11-06 DIAGNOSIS — Z87891 Personal history of nicotine dependence: Secondary | ICD-10-CM

## 2014-11-06 DIAGNOSIS — E039 Hypothyroidism, unspecified: Secondary | ICD-10-CM | POA: Diagnosis present

## 2014-11-06 DIAGNOSIS — R0600 Dyspnea, unspecified: Secondary | ICD-10-CM

## 2014-11-06 DIAGNOSIS — Z809 Family history of malignant neoplasm, unspecified: Secondary | ICD-10-CM | POA: Diagnosis not present

## 2014-11-06 DIAGNOSIS — I071 Rheumatic tricuspid insufficiency: Secondary | ICD-10-CM | POA: Diagnosis present

## 2014-11-06 DIAGNOSIS — Z91041 Radiographic dye allergy status: Secondary | ICD-10-CM

## 2014-11-06 DIAGNOSIS — I2510 Atherosclerotic heart disease of native coronary artery without angina pectoris: Secondary | ICD-10-CM | POA: Diagnosis present

## 2014-11-06 DIAGNOSIS — I059 Rheumatic mitral valve disease, unspecified: Secondary | ICD-10-CM | POA: Diagnosis not present

## 2014-11-06 DIAGNOSIS — I5023 Acute on chronic systolic (congestive) heart failure: Secondary | ICD-10-CM | POA: Diagnosis present

## 2014-11-06 DIAGNOSIS — D509 Iron deficiency anemia, unspecified: Secondary | ICD-10-CM | POA: Diagnosis present

## 2014-11-06 DIAGNOSIS — Z853 Personal history of malignant neoplasm of breast: Secondary | ICD-10-CM

## 2014-11-06 DIAGNOSIS — M1990 Unspecified osteoarthritis, unspecified site: Secondary | ICD-10-CM | POA: Diagnosis present

## 2014-11-06 DIAGNOSIS — R609 Edema, unspecified: Secondary | ICD-10-CM | POA: Diagnosis present

## 2014-11-06 DIAGNOSIS — R001 Bradycardia, unspecified: Secondary | ICD-10-CM | POA: Diagnosis present

## 2014-11-06 DIAGNOSIS — J189 Pneumonia, unspecified organism: Secondary | ICD-10-CM | POA: Diagnosis not present

## 2014-11-06 DIAGNOSIS — M810 Age-related osteoporosis without current pathological fracture: Secondary | ICD-10-CM | POA: Diagnosis present

## 2014-11-06 DIAGNOSIS — J9801 Acute bronchospasm: Secondary | ICD-10-CM | POA: Diagnosis present

## 2014-11-06 DIAGNOSIS — I872 Venous insufficiency (chronic) (peripheral): Secondary | ICD-10-CM | POA: Diagnosis present

## 2014-11-06 DIAGNOSIS — I129 Hypertensive chronic kidney disease with stage 1 through stage 4 chronic kidney disease, or unspecified chronic kidney disease: Secondary | ICD-10-CM | POA: Diagnosis present

## 2014-11-06 DIAGNOSIS — R531 Weakness: Secondary | ICD-10-CM | POA: Diagnosis not present

## 2014-11-06 DIAGNOSIS — R059 Cough, unspecified: Secondary | ICD-10-CM | POA: Insufficient documentation

## 2014-11-06 DIAGNOSIS — R739 Hyperglycemia, unspecified: Secondary | ICD-10-CM

## 2014-11-06 DIAGNOSIS — Z85038 Personal history of other malignant neoplasm of large intestine: Secondary | ICD-10-CM

## 2014-11-06 DIAGNOSIS — Z79899 Other long term (current) drug therapy: Secondary | ICD-10-CM

## 2014-11-06 DIAGNOSIS — Z8249 Family history of ischemic heart disease and other diseases of the circulatory system: Secondary | ICD-10-CM | POA: Diagnosis not present

## 2014-11-06 DIAGNOSIS — Z7982 Long term (current) use of aspirin: Secondary | ICD-10-CM

## 2014-11-06 DIAGNOSIS — R0602 Shortness of breath: Secondary | ICD-10-CM | POA: Diagnosis not present

## 2014-11-06 HISTORY — DX: Dyspnea, unspecified: R06.00

## 2014-11-06 HISTORY — DX: Hyperglycemia, unspecified: R73.9

## 2014-11-06 LAB — CBC WITH DIFFERENTIAL/PLATELET
Basophils Absolute: 0 10*3/uL (ref 0.0–0.1)
Basophils Relative: 0 % (ref 0–1)
EOS ABS: 0.1 10*3/uL (ref 0.0–0.7)
Eosinophils Relative: 1 % (ref 0–5)
HCT: 34.3 % — ABNORMAL LOW (ref 36.0–46.0)
Hemoglobin: 10.6 g/dL — ABNORMAL LOW (ref 12.0–15.0)
Lymphocytes Relative: 12 % (ref 12–46)
Lymphs Abs: 1.4 10*3/uL (ref 0.7–4.0)
MCH: 27.7 pg (ref 26.0–34.0)
MCHC: 30.9 g/dL (ref 30.0–36.0)
MCV: 89.6 fL (ref 78.0–100.0)
Monocytes Absolute: 1.1 10*3/uL — ABNORMAL HIGH (ref 0.1–1.0)
Monocytes Relative: 10 % (ref 3–12)
NEUTROS ABS: 9 10*3/uL — AB (ref 1.7–7.7)
NEUTROS PCT: 77 % (ref 43–77)
Platelets: 382 10*3/uL (ref 150–400)
RBC: 3.83 MIL/uL — ABNORMAL LOW (ref 3.87–5.11)
RDW: 15.4 % (ref 11.5–15.5)
WBC: 11.6 10*3/uL — AB (ref 4.0–10.5)

## 2014-11-06 LAB — COMPREHENSIVE METABOLIC PANEL
ALBUMIN: 3.8 g/dL (ref 3.5–5.2)
ALT: 25 U/L (ref 0–35)
AST: 24 U/L (ref 0–37)
Alkaline Phosphatase: 80 U/L (ref 39–117)
Anion gap: 12 (ref 5–15)
BUN: 32 mg/dL — AB (ref 6–23)
CHLORIDE: 100 meq/L (ref 96–112)
CO2: 27 mmol/L (ref 19–32)
Calcium: 9.8 mg/dL (ref 8.4–10.5)
Creatinine, Ser: 1.24 mg/dL — ABNORMAL HIGH (ref 0.50–1.10)
GFR calc Af Amer: 44 mL/min — ABNORMAL LOW (ref >90)
GFR calc non Af Amer: 38 mL/min — ABNORMAL LOW (ref >90)
Glucose, Bld: 100 mg/dL — ABNORMAL HIGH (ref 70–99)
POTASSIUM: 4 mmol/L (ref 3.5–5.1)
Sodium: 139 mmol/L (ref 135–145)
Total Bilirubin: 1 mg/dL (ref 0.3–1.2)
Total Protein: 6.6 g/dL (ref 6.0–8.3)

## 2014-11-06 LAB — I-STAT CG4 LACTIC ACID, ED: Lactic Acid, Venous: 0.97 mmol/L (ref 0.5–2.2)

## 2014-11-06 LAB — TROPONIN I
TROPONIN I: 0.04 ng/mL — AB (ref <0.031)
Troponin I: 0.05 ng/mL — ABNORMAL HIGH (ref <0.031)

## 2014-11-06 LAB — D-DIMER, QUANTITATIVE: D-Dimer, Quant: 0.83 ug/mL-FEU — ABNORMAL HIGH (ref 0.00–0.48)

## 2014-11-06 LAB — BRAIN NATRIURETIC PEPTIDE: B Natriuretic Peptide: 711.8 pg/mL — ABNORMAL HIGH (ref 0.0–100.0)

## 2014-11-06 LAB — DIGOXIN LEVEL: Digoxin Level: 1.1 ng/mL (ref 0.8–2.0)

## 2014-11-06 MED ORDER — DIPHENHYDRAMINE HCL 50 MG/ML IJ SOLN
50.0000 mg | Freq: Once | INTRAMUSCULAR | Status: AC
  Administered 2014-11-06: 50 mg via INTRAVENOUS
  Filled 2014-11-06: qty 1

## 2014-11-06 MED ORDER — ALBUTEROL SULFATE (2.5 MG/3ML) 0.083% IN NEBU
5.0000 mg | INHALATION_SOLUTION | Freq: Once | RESPIRATORY_TRACT | Status: AC
  Administered 2014-11-06: 5 mg via RESPIRATORY_TRACT
  Filled 2014-11-06: qty 6

## 2014-11-06 MED ORDER — SODIUM CHLORIDE 0.9 % IV SOLN
INTRAVENOUS | Status: DC
  Administered 2014-11-06: 15:00:00 via INTRAVENOUS

## 2014-11-06 MED ORDER — SODIUM CHLORIDE 0.9 % IV BOLUS (SEPSIS)
500.0000 mL | Freq: Once | INTRAVENOUS | Status: AC
  Administered 2014-11-06: 500 mL via INTRAVENOUS

## 2014-11-06 MED ORDER — SODIUM CHLORIDE 0.9 % IV SOLN: 250.0000 mL | INTRAVENOUS | Status: DC | PRN

## 2014-11-06 MED ORDER — DIGOXIN 125 MCG PO TABS
0.1250 mg | ORAL_TABLET | Freq: Every day | ORAL | Status: DC
  Administered 2014-11-06 – 2014-11-11 (×6): 0.125 mg via ORAL
  Filled 2014-11-06 (×6): qty 1

## 2014-11-06 MED ORDER — DEXTROSE 5 % IV SOLN
500.0000 mg | INTRAVENOUS | Status: DC
  Administered 2014-11-06 – 2014-11-08 (×3): 500 mg via INTRAVENOUS
  Filled 2014-11-06 (×4): qty 500

## 2014-11-06 MED ORDER — DEXTROSE 5 % IV SOLN
1.0000 g | INTRAVENOUS | Status: DC
  Administered 2014-11-06 – 2014-11-10 (×5): 1 g via INTRAVENOUS
  Filled 2014-11-06 (×5): qty 10

## 2014-11-06 MED ORDER — ONDANSETRON HCL 4 MG/2ML IJ SOLN: 4.0000 mg | Freq: Three times a day (TID) | INTRAMUSCULAR | Status: DC | PRN

## 2014-11-06 MED ORDER — LEVOTHYROXINE SODIUM 75 MCG PO TABS
75.0000 ug | ORAL_TABLET | Freq: Every day | ORAL | Status: DC
  Administered 2014-11-07 – 2014-11-11 (×5): 75 ug via ORAL
  Filled 2014-11-06 (×5): qty 1

## 2014-11-06 MED ORDER — PANTOPRAZOLE SODIUM 40 MG PO TBEC
40.0000 mg | DELAYED_RELEASE_TABLET | Freq: Every day | ORAL | Status: DC
  Administered 2014-11-07 – 2014-11-11 (×5): 40 mg via ORAL
  Filled 2014-11-06 (×5): qty 1

## 2014-11-06 MED ORDER — LORATADINE 10 MG PO TABS
10.0000 mg | ORAL_TABLET | Freq: Every day | ORAL | Status: DC
  Administered 2014-11-06 – 2014-11-11 (×6): 10 mg via ORAL
  Filled 2014-11-06 (×6): qty 1

## 2014-11-06 MED ORDER — IPRATROPIUM-ALBUTEROL 0.5-2.5 (3) MG/3ML IN SOLN: 3.0000 mL | Freq: Four times a day (QID) | RESPIRATORY_TRACT | Status: DC | PRN

## 2014-11-06 MED ORDER — URSODIOL 300 MG PO CAPS
300.0000 mg | ORAL_CAPSULE | Freq: Two times a day (BID) | ORAL | Status: DC
  Administered 2014-11-06 – 2014-11-11 (×10): 300 mg via ORAL
  Filled 2014-11-06 (×10): qty 1

## 2014-11-06 MED ORDER — LOSARTAN POTASSIUM 50 MG PO TABS
50.0000 mg | ORAL_TABLET | Freq: Every day | ORAL | Status: DC
  Administered 2014-11-07 – 2014-11-08 (×2): 50 mg via ORAL
  Filled 2014-11-06 (×3): qty 1

## 2014-11-06 MED ORDER — CARVEDILOL 12.5 MG PO TABS
12.5000 mg | ORAL_TABLET | Freq: Two times a day (BID) | ORAL | Status: DC
  Administered 2014-11-07 – 2014-11-08 (×3): 12.5 mg via ORAL
  Filled 2014-11-06 (×4): qty 1

## 2014-11-06 MED ORDER — ASPIRIN EC 81 MG PO TBEC
81.0000 mg | DELAYED_RELEASE_TABLET | Freq: Every day | ORAL | Status: DC
  Administered 2014-11-06 – 2014-11-11 (×6): 81 mg via ORAL
  Filled 2014-11-06 (×6): qty 1

## 2014-11-06 MED ORDER — SODIUM CHLORIDE 0.9 % IV SOLN
INTRAVENOUS | Status: DC
  Administered 2014-11-06: 17:00:00 via INTRAVENOUS

## 2014-11-06 MED ORDER — ALPRAZOLAM 0.25 MG PO TABS
0.2500 mg | ORAL_TABLET | Freq: Two times a day (BID) | ORAL | Status: DC | PRN
  Administered 2014-11-06 – 2014-11-10 (×5): 0.25 mg via ORAL
  Filled 2014-11-06 (×5): qty 1

## 2014-11-06 MED ORDER — HYDROCORTISONE NA SUCCINATE PF 100 MG IJ SOLR
200.0000 mg | Freq: Once | INTRAMUSCULAR | Status: AC
  Administered 2014-11-06: 200 mg via INTRAVENOUS
  Filled 2014-11-06: qty 4

## 2014-11-06 MED ORDER — SODIUM CHLORIDE 0.9 % IV SOLN: INTRAVENOUS | Status: DC

## 2014-11-06 MED ORDER — ALBUTEROL SULFATE (2.5 MG/3ML) 0.083% IN NEBU: 2.5000 mg | INHALATION_SOLUTION | RESPIRATORY_TRACT | Status: DC | PRN

## 2014-11-06 MED ORDER — ATORVASTATIN CALCIUM 10 MG PO TABS
10.0000 mg | ORAL_TABLET | Freq: Every day | ORAL | Status: DC
  Administered 2014-11-07 – 2014-11-10 (×4): 10 mg via ORAL
  Filled 2014-11-06 (×5): qty 1

## 2014-11-06 MED ORDER — ACETAMINOPHEN 325 MG PO TABS: 650.0000 mg | ORAL_TABLET | Freq: Four times a day (QID) | ORAL | Status: DC | PRN

## 2014-11-06 MED ORDER — PREGABALIN 50 MG PO CAPS
50.0000 mg | ORAL_CAPSULE | Freq: Every day | ORAL | Status: DC
Start: 2014-11-06 — End: 2014-11-11
  Administered 2014-11-06 – 2014-11-10 (×5): 50 mg via ORAL
  Filled 2014-11-06 (×5): qty 1

## 2014-11-06 MED ORDER — ENOXAPARIN SODIUM 30 MG/0.3ML ~~LOC~~ SOLN
30.0000 mg | SUBCUTANEOUS | Status: DC
  Administered 2014-11-06 – 2014-11-10 (×5): 30 mg via SUBCUTANEOUS
  Filled 2014-11-06 (×5): qty 0.3

## 2014-11-06 MED ORDER — FLUTICASONE PROPIONATE 50 MCG/ACT NA SUSP
2.0000 | Freq: Every day | NASAL | Status: DC
  Administered 2014-11-06 – 2014-11-11 (×5): 2 via NASAL
  Filled 2014-11-06: qty 16

## 2014-11-06 MED ORDER — IOHEXOL 350 MG/ML SOLN
100.0000 mL | Freq: Once | INTRAVENOUS | Status: AC | PRN
  Administered 2014-11-06: 100 mL via INTRAVENOUS

## 2014-11-06 MED ORDER — SODIUM CHLORIDE 0.9 % IJ SOLN
3.0000 mL | Freq: Two times a day (BID) | INTRAMUSCULAR | Status: DC
  Administered 2014-11-06 – 2014-11-11 (×6): 3 mL via INTRAVENOUS

## 2014-11-06 MED ORDER — SODIUM CHLORIDE 0.9 % IJ SOLN
3.0000 mL | Freq: Two times a day (BID) | INTRAMUSCULAR | Status: DC
  Administered 2014-11-07 – 2014-11-10 (×5): 3 mL via INTRAVENOUS

## 2014-11-06 MED ORDER — ACETAMINOPHEN 650 MG RE SUPP: 650.0000 mg | Freq: Four times a day (QID) | RECTAL | Status: DC | PRN

## 2014-11-06 MED ORDER — POLYETHYLENE GLYCOL 3350 17 G PO PACK
17.0000 g | PACK | Freq: Every day | ORAL | Status: DC | PRN
  Administered 2014-11-08 – 2014-11-09 (×2): 17 g via ORAL
  Filled 2014-11-06 (×3): qty 1

## 2014-11-06 MED ORDER — FUROSEMIDE 40 MG PO TABS
40.0000 mg | ORAL_TABLET | Freq: Every day | ORAL | Status: DC
  Administered 2014-11-07 – 2014-11-11 (×5): 40 mg via ORAL
  Filled 2014-11-06 (×5): qty 1

## 2014-11-06 MED ORDER — SODIUM CHLORIDE 0.9 % IJ SOLN: 3.0000 mL | INTRAMUSCULAR | Status: DC | PRN

## 2014-11-06 MED ORDER — NITROGLYCERIN 0.4 MG SL SUBL: 0.4000 mg | SUBLINGUAL_TABLET | SUBLINGUAL | Status: DC | PRN

## 2014-11-06 MED ORDER — BUPROPION HCL ER (XL) 150 MG PO TB24
150.0000 mg | ORAL_TABLET | Freq: Every day | ORAL | Status: DC
  Administered 2014-11-07 – 2014-11-11 (×5): 150 mg via ORAL
  Filled 2014-11-06 (×6): qty 1

## 2014-11-06 NOTE — ED Notes (Addendum)
Bed: EN27 Expected date: 11/06/14 Expected time: 2:05 PM Means of arrival:  Comments: Pneumonia

## 2014-11-06 NOTE — ED Notes (Signed)
MD at bedside. 

## 2014-11-06 NOTE — ED Notes (Signed)
Hospitalist at bedside 

## 2014-11-06 NOTE — ED Notes (Signed)
Dx Pneumonia 1 week, started on Doxy, switched to Levaquin, took first dose this am, feels no better, referred her from [facility] nurse after speaking with her MD.
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
## 2014-11-06 NOTE — ED Notes (Signed)
Patient transported to CT 

## 2014-11-07 DIAGNOSIS — D509 Iron deficiency anemia, unspecified: Secondary | ICD-10-CM

## 2014-11-07 DIAGNOSIS — R0600 Dyspnea, unspecified: Secondary | ICD-10-CM

## 2014-11-07 DIAGNOSIS — R05 Cough: Secondary | ICD-10-CM

## 2014-11-07 DIAGNOSIS — I059 Rheumatic mitral valve disease, unspecified: Secondary | ICD-10-CM

## 2014-11-07 DIAGNOSIS — R059 Cough, unspecified: Secondary | ICD-10-CM | POA: Insufficient documentation

## 2014-11-07 LAB — CBC WITH DIFFERENTIAL/PLATELET
BASOS PCT: 0 % (ref 0–1)
Basophils Absolute: 0 10*3/uL (ref 0.0–0.1)
EOS PCT: 0 % (ref 0–5)
Eosinophils Absolute: 0 10*3/uL (ref 0.0–0.7)
HEMATOCRIT: 28.6 % — AB (ref 36.0–46.0)
HEMOGLOBIN: 9.1 g/dL — AB (ref 12.0–15.0)
LYMPHS ABS: 0.9 10*3/uL (ref 0.7–4.0)
Lymphocytes Relative: 9 % — ABNORMAL LOW (ref 12–46)
MCH: 28.3 pg (ref 26.0–34.0)
MCHC: 31.8 g/dL (ref 30.0–36.0)
MCV: 88.8 fL (ref 78.0–100.0)
Monocytes Absolute: 0.3 10*3/uL (ref 0.1–1.0)
Monocytes Relative: 3 % (ref 3–12)
Neutro Abs: 8.7 10*3/uL — ABNORMAL HIGH (ref 1.7–7.7)
Neutrophils Relative %: 88 % — ABNORMAL HIGH (ref 43–77)
Platelets: 279 10*3/uL (ref 150–400)
RBC: 3.22 MIL/uL — AB (ref 3.87–5.11)
RDW: 15.4 % (ref 11.5–15.5)
WBC: 9.9 10*3/uL (ref 4.0–10.5)

## 2014-11-07 LAB — COMPREHENSIVE METABOLIC PANEL
ALBUMIN: 3.1 g/dL — AB (ref 3.5–5.2)
ALK PHOS: 63 U/L (ref 39–117)
ALT: 18 U/L (ref 0–35)
AST: 20 U/L (ref 0–37)
Anion gap: 9 (ref 5–15)
BUN: 28 mg/dL — ABNORMAL HIGH (ref 6–23)
CALCIUM: 8.6 mg/dL (ref 8.4–10.5)
CO2: 23 mmol/L (ref 19–32)
Chloride: 100 mEq/L (ref 96–112)
Creatinine, Ser: 1.22 mg/dL — ABNORMAL HIGH (ref 0.50–1.10)
GFR calc non Af Amer: 39 mL/min — ABNORMAL LOW (ref >90)
GFR, EST AFRICAN AMERICAN: 45 mL/min — AB (ref >90)
Glucose, Bld: 119 mg/dL — ABNORMAL HIGH (ref 70–99)
Potassium: 3.9 mmol/L (ref 3.5–5.1)
Sodium: 132 mmol/L — ABNORMAL LOW (ref 135–145)
Total Bilirubin: 0.4 mg/dL (ref 0.3–1.2)
Total Protein: 5.6 g/dL — ABNORMAL LOW (ref 6.0–8.3)

## 2014-11-07 LAB — FERRITIN: Ferritin: 20 ng/mL (ref 10–291)

## 2014-11-07 LAB — CK TOTAL AND CKMB (NOT AT ARMC)
CK, MB: 3.6 ng/mL (ref 0.3–4.0)
CK, MB: 4 ng/mL (ref 0.3–4.0)
CK, MB: 5.1 ng/mL — AB (ref 0.3–4.0)
Relative Index: INVALID (ref 0.0–2.5)
Relative Index: INVALID (ref 0.0–2.5)
Relative Index: INVALID (ref 0.0–2.5)
Total CK: 52 U/L (ref 7–177)
Total CK: 44 U/L (ref 7–177)
Total CK: 34 U/L (ref 7–177)

## 2014-11-07 LAB — IRON AND TIBC
Iron: 19 ug/dL — ABNORMAL LOW (ref 42–145)
Saturation Ratios: 6 % — ABNORMAL LOW (ref 20–55)
TIBC: 301 ug/dL (ref 250–470)
UIBC: 282 ug/dL (ref 125–400)

## 2014-11-07 LAB — BRAIN NATRIURETIC PEPTIDE: B Natriuretic Peptide: 651.3 pg/mL — ABNORMAL HIGH (ref 0.0–100.0)

## 2014-11-07 LAB — TROPONIN I
Troponin I: 0.04 ng/mL — ABNORMAL HIGH (ref <0.031)
Troponin I: 0.03 ng/mL (ref <0.031)

## 2014-11-07 LAB — VITAMIN B12: Vitamin B-12: 1113 pg/mL — ABNORMAL HIGH (ref 211–911)

## 2014-11-07 MED ORDER — ALBUTEROL SULFATE (2.5 MG/3ML) 0.083% IN NEBU
2.5000 mg | INHALATION_SOLUTION | Freq: Four times a day (QID) | RESPIRATORY_TRACT | Status: DC
  Administered 2014-11-07 – 2014-11-08 (×6): 2.5 mg via RESPIRATORY_TRACT
  Filled 2014-11-07 (×7): qty 3

## 2014-11-07 MED ORDER — VITAMINS A & D EX OINT
TOPICAL_OINTMENT | CUTANEOUS | Status: DC | PRN
  Filled 2014-11-07: qty 5

## 2014-11-07 MED ORDER — IPRATROPIUM-ALBUTEROL 0.5-2.5 (3) MG/3ML IN SOLN: 3.0000 mL | RESPIRATORY_TRACT | Status: DC | PRN
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
## 2014-11-07 NOTE — Progress Notes (Signed)
  Echocardiogram 2D Echocardiogram has been performed.  Debra Brooks 11/07/2014, 10:13 AM

## 2014-11-07 NOTE — Progress Notes (Addendum)
Patient ID: Debra Brooks, female   DOB: Aug 05, 1925, 81 y.o.   MRN: [id number]  [facility] PROGRESS NOTE  ALANIS CLIFT NIO:[id number] DOB: [birth year] DOA: 11/06/2014 PCP: Hollace Kinnier, DO  Brief narrative: 81 yo female with hx fo CAD, CHF (EF45-50%), apparently was recently diagnosed with pneumonia about 5 days PTA at [place] by Hollace Kinnier, and tx with doxycycline and when she was not improving she was tx with levaquin starting one day PTA which made her feel sick. Pt notes dry cough, and slight sob, but denies fever, chills, cp, palp, n/v, diarrhea.  Assessment and Plan:    Active Problems:   Acute respiratory failure  - appears to be multifactorial and secondary to ? Viral vs bacterial PNA, ? Mild volume congestion - currently feels better and denies shortness of breath - will continue treatment with Zithromax and Rocephin day #2 - WBC trending down and is WNL this AM  - also continue lasix for now - provide BD's scheduled and as needed, oxygen as needed, antitussives as needed    Acute on chronic systolic CHF - EF 45 - 5-% - continue Lasix, monitor weights, I's and O's - 2 D ECHO pending    Chronic kidney disease (CKD), stage III (moderate) - continue to monitor renal function with daily BMP   Anemia, iron deficiency - no signs of active bleeding - repeat CBC in AM   HTN - stable on home medical regimen   Hypothyroidism  - continue synthroid   DVT prophylaxis  Lovenox SQ while pt is in hospital  Code Status: Full Family Communication: Pt at bedside Disposition Plan: Remains inpatient   IV access:   Peripheral IV Procedures and diagnostic studies;  CXR  11/06/2014 No acute cardiopulmonary dz. Age- indeterminate compression inferior endplate of a mid/lower thoracic vertebral body.    Ct Angio Chest Pe W/cm &/or Wo Cm  11/06/2014  No acute findings.  No evidence of pulmonary embolism.   Medical consultants:   None  Other consultants:   None  Anti-infectives:    Zithromax 1/9 --> Rocephin 1/9 -->  Faye Ramsay, MD  [facility] Pager [phone]  If 7PM-7AM, please contact night-coverage [email or web address] Password [email or web address] 11/07/2014, 10:34 AM   LOS: 1 day   HPI/Subjective: No events overnight.   Objective: Filed Vitals:   11/06/14 1700 11/06/14 1800 11/06/14 2140 11/07/14 0512  BP: 153/84 144/73 111/95 130/46  Pulse: 94 97  63  Temp:   98.4 F (36.9 C) 98 F (36.7 C)  TempSrc:   Oral Oral  Resp: 20 26 20 16   Height:   5\' 7"  (1.702 m)   Weight:   66.815 kg (147 lb 4.8 oz)   SpO2: 100% 95% 98% 94%    Intake/Output Summary (Last 24 hours) at 11/07/14 1034 Last data filed at 11/07/14 0944  Gross per 24 hour  Intake    360 ml  Output    250 ml  Net    110 ml    Exam:   General:  Pt is alert, follows commands appropriately, not in acute distress  Cardiovascular: Regular rate and rhythm, S1/S2, SEM 2/6, no rubs, no gallops  Respiratory: Bilateral rales and worse at bases   Abdomen: Soft, non tender, non distended, bowel sounds present, no guarding  Extremities: Trace bilateral LE pitting edema, pulses DP and PT palpable bilaterally  Data Reviewed: Basic Metabolic Panel:  Recent Labs Lab 11/04/14 11/06/14 1453 11/07/14 0425  NA 137 139 132*  K 4.1 4.0  3.9  CL  --  100 100  CO2  --  27 23  GLUCOSE  --  100* 119*  BUN 36* 32* 28*  CREATININE 1.2* 1.24* 1.22*  CALCIUM  --  9.8 8.6   Liver Function Tests:  Recent Labs Lab 11/06/14 1453 11/07/14 0425  AST 24 20  ALT 25 18  ALKPHOS 80 63  BILITOT 1.0 0.4  PROT 6.6 5.6*  ALBUMIN 3.8 3.1*   CBC:  Recent Labs Lab 11/04/14 11/06/14 1453 11/07/14 0425  WBC 15.4 11.6* 9.9  NEUTROABS  --  9.0* 8.7*  HGB 9.5* 10.6* 9.1*  HCT 28* 34.3* 28.6*  MCV  --  89.6 88.8  PLT 320 382 279   Cardiac Enzymes:  Recent Labs Lab 11/06/14 1557 11/06/14 2249 11/07/14 0425  CKTOTAL  --  52 44  CKMB  --  3.6 4.0  TROPONINI 0.04* 0.05* 0.04*   Scheduled Meds: . aspirin EC   81 mg Oral Daily  . atorvastatin  10 mg Oral q1800  . azithromycin  500 mg Intravenous Q24H  . buPROPion  150 mg Oral Daily  . carvedilol  12.5 mg Oral BID WC  . cefTRIAXone  IV  1 g Intravenous Q24H  . digoxin  0.125 mg Oral Daily  . Enoxaparin injection  30 mg Subcutaneous Q24H  . fluticasone  2 spray Each Nare Daily  . furosemide  40 mg Oral Daily  . levothyroxine  75 mcg Oral QAC breakfast  . loratadine  10 mg Oral Daily  . losartan  50 mg Oral Daily  . pantoprazole  40 mg Oral Daily  . pregabalin  50 mg Oral QHS  . ursodiol  300 mg Oral BID   Continuous Infusions: . sodium chloride Stopped (11/06/14 1701)

## 2014-11-07 NOTE — Progress Notes (Signed)
CARE MANAGEMENT NOTE 11/07/2014  Patient:  Debra Brooks, Debra Brooks   Account Number:  [id number]  Date Initiated:  11/07/2014  Documentation initiated by:  [facility]  Subjective/Objective Assessment:   Acute respiratory failure, CHF     Action/Plan:   [facility] [place]   Anticipated DC Date:     Anticipated DC Plan:  SKILLED NURSING FACILITY  In-house referral  Clinical Social Worker      DC Planning Services  CM consult      Choice offered to / List presented to:             Status of service:  In process, will continue to follow Medicare Important Message given?   (If response is "NO", the following Medicare IM given date fields will be blank) Date Medicare IM given:   Medicare IM given by:   Date Additional Medicare IM given:   Additional Medicare IM given by:    Discharge Disposition:    Per UR Regulation:    If discussed at Long Length of Stay Meetings, dates discussed:    Comments:  11/07/2014 1100 NCM spoke to pt and states she lives at [place]. States she plans to go back to the SNF-rehab of [facility] at dc. States she lives in her apt alone and she does use RW all the time. Contacted CSW with referral for SNF placement at dc. Jonnie Finner RN CCM Case Mgmt phone [phone]

## 2014-11-08 ENCOUNTER — Other Ambulatory Visit: Payer: Self-pay

## 2014-11-08 LAB — BASIC METABOLIC PANEL
Anion gap: 8 (ref 5–15)
BUN: 25 mg/dL — AB (ref 6–23)
CO2: 27 mmol/L (ref 19–32)
Calcium: 9 mg/dL (ref 8.4–10.5)
Chloride: 104 mEq/L (ref 96–112)
Creatinine, Ser: 1.2 mg/dL — ABNORMAL HIGH (ref 0.50–1.10)
GFR, EST AFRICAN AMERICAN: 46 mL/min — AB (ref >90)
GFR, EST NON AFRICAN AMERICAN: 39 mL/min — AB (ref >90)
GLUCOSE: 84 mg/dL (ref 70–99)
Potassium: 3.6 mmol/L (ref 3.5–5.1)
SODIUM: 139 mmol/L (ref 135–145)

## 2014-11-08 LAB — BRAIN NATRIURETIC PEPTIDE: B Natriuretic Peptide: 396 pg/mL — ABNORMAL HIGH (ref 0.0–100.0)

## 2014-11-08 LAB — CBC
HCT: 27.9 % — ABNORMAL LOW (ref 36.0–46.0)
Hemoglobin: 8.7 g/dL — ABNORMAL LOW (ref 12.0–15.0)
MCH: 27.9 pg (ref 26.0–34.0)
MCHC: 31.2 g/dL (ref 30.0–36.0)
MCV: 89.4 fL (ref 78.0–100.0)
PLATELETS: 271 10*3/uL (ref 150–400)
RBC: 3.12 MIL/uL — ABNORMAL LOW (ref 3.87–5.11)
RDW: 15.6 % — ABNORMAL HIGH (ref 11.5–15.5)
WBC: 10.5 10*3/uL (ref 4.0–10.5)

## 2014-11-08 LAB — FOLATE RBC
Folate, Hemolysate: >619 ng/mL
Folate, RBC: >2127 ng/mL (ref >498)
Hematocrit: 29.1 % — ABNORMAL LOW (ref 34.0–46.6)

## 2014-11-08 LAB — MRSA PCR SCREENING: MRSA BY PCR: NEGATIVE

## 2014-11-08 MED ORDER — GUAIFENESIN 100 MG/5ML PO SYRP
200.0000 mg | ORAL_SOLUTION | ORAL | Status: DC | PRN
  Filled 2014-11-08: qty 10

## 2014-11-08 MED ORDER — ALBUTEROL SULFATE (2.5 MG/3ML) 0.083% IN NEBU
2.5000 mg | INHALATION_SOLUTION | Freq: Three times a day (TID) | RESPIRATORY_TRACT | Status: DC
  Administered 2014-11-09 – 2014-11-10 (×4): 2.5 mg via RESPIRATORY_TRACT
  Filled 2014-11-08 (×4): qty 3

## 2014-11-08 MED ORDER — GUAIFENESIN ER 600 MG PO TB12
600.0000 mg | ORAL_TABLET | Freq: Two times a day (BID) | ORAL | Status: DC
  Administered 2014-11-08 – 2014-11-11 (×7): 600 mg via ORAL
  Filled 2014-11-08 (×7): qty 1

## 2014-11-08 MED ORDER — CARVEDILOL 3.125 MG PO TABS
3.1250 mg | ORAL_TABLET | Freq: Two times a day (BID) | ORAL | Status: DC
  Administered 2014-11-09 – 2014-11-11 (×5): 3.125 mg via ORAL
  Filled 2014-11-08 (×5): qty 1

## 2014-11-08 NOTE — Progress Notes (Signed)
Notified from central telemetry of bradycardia. Pt heart rate as low as 48. Pt is asymptomatic, denies dizziness, and mentation is WNL . Alene Mires, on call provider ordered an EKG and notified of results. Will continue to monitor.

## 2014-11-08 NOTE — Care Management Note (Addendum)
    Page 1 of 2   11/11/2014     1:29:30 PM CARE MANAGEMENT NOTE 11/11/2014  Patient:  Debra Brooks, Debra Brooks   Account Number:  [id number]  Date Initiated:  11/07/2014  Documentation initiated by:  [facility]  Subjective/Objective Assessment:   Acute respiratory failure, CHF     Action/Plan:   [facility] [place]   Anticipated DC Date:  11/11/2014   Anticipated DC Plan:  SKILLED NURSING FACILITY  In-house referral  Clinical Social Worker      DC Planning Services  CM consult      Choice offered to / List presented to:  C-1 Patient           Status of service:  Completed, signed off Medicare Important Message given?  YES (If response is "NO", the following Medicare IM given date fields will be blank) Date Medicare IM given:  11/09/2014 Medicare IM given by:  [facility] Date Additional Medicare IM given:  11/11/2014 Additional Medicare IM given by:  [facility]  Discharge Disposition:  [facility]  Per UR Regulation:  Reviewed for med. necessity/level of care/duration of stay  If discussed at [place] of Stay Meetings, dates discussed:   11/11/2014    Comments:  11/11/14 Dessa Phi RN BSN NCM [phone] d/c snf.  11/10/14 Dessa Phi RN BSN NCM [phone] d/c plan snf.  11/08/14 Dessa Phi RN BSN NCM [phone] PT-HH.TC [facility] tel#[phone],spoke to [employer] RN-they use [facility], fax HHPT order to fax#[phone],attn PT Dept.Await final HHC order.  11/07/2014 1100 NCM spoke to pt and states she lives at [place]. States she plans to go back to the SNF-rehab of [facility] at dc. States she lives in her apt alone and she does use RW all the time. Contacted CSW with referral for SNF placement at dc. Jonnie Finner RN CCM Case Mgmt phone [phone]

## 2014-11-08 NOTE — Progress Notes (Addendum)
Patient ID: Debra Brooks, female   DOB: 08/16/1925, 81 y.o.   MRN: [id number]  [facility] PROGRESS NOTE  Debra Brooks TGG:[id number] DOB: 27-Nov-1924 DOA: 11/06/2014 PCP: Hollace Kinnier, DO  Brief narrative: 81 yo female with hx fo CAD, CHF (EF45-50%), apparently was recently diagnosed with pneumonia about 5 days PTA at [facility] by Hollace Kinnier, and tx with doxycycline and when she was not improving she was tx with levaquin starting one day PTA which made her feel sick. Pt notes dry cough, and slight sob, but denies fever, chills, cp, palp, n/v, diarrhea.  Assessment and Plan:    Active Problems:  Acute respiratory failure  - appears to be multifactorial and secondary to ? Viral vs bacterial PNA, ? Mild volume congestion - currently feels better and denies shortness of breath but still wheezing  - will continue treatment with Zithromax and Rocephin day #3 - WBC trending down and is WNL this AM  - also continue lasix for now - provide BD's scheduled and as needed, oxygen as needed, antitussives as needed   Acute on chronic systolic CHF - EF 45 - 5-% - continue Lasix, monitor weights, I's and O's - 2 D ECHO pending  - weight stable at 145 lbs (weight 147 lbs on admission)    Bradycardia  - lower the dose of Coreg and monitor on tele   Chronic kidney disease (CKD), stage III (moderate) - continue to monitor renal function with daily BMP - hold Losartan until renal function stabilizes   Anemia, iron deficiency - no signs of active bleeding - repeat CBC in AM  HTN - stable on home medical regimen  Hypothyroidism  - continue synthroid   DVT prophylaxis  Lovenox SQ while pt is in hospital  Code Status: Full Family Communication: Pt at bedside Disposition Plan: Remains inpatient   IV access:   Peripheral IV Procedures and diagnostic studies;  CXR 11/06/2014 No acute cardiopulmonary dz. Age- indeterminate compression inferior endplate of a mid/lower  thoracic vertebral body.  Ct Angio Chest Pe W/cm &/or Wo Cm 11/06/2014 No acute findings. No evidence of pulmonary embolism.  Medical consultants:   None  Other consultants:   None  Anti-infectives:   Zithromax 1/9 --> Rocephin 1/9 -->  Faye Ramsay, MD  [facility] Pager [phone]  If 7PM-7AM, please contact night-coverage [email or web address] Password [facility] 11/08/2014, 3:39 PM   LOS: 2 days   HPI/Subjective: No events overnight.   Objective: Filed Vitals:   11/08/14 0856 11/08/14 1059 11/08/14 1108 11/08/14 1348  BP: 116/43   109/48  Pulse:    62  Temp:    98.1 F (36.7 C)  TempSrc:    Oral  Resp:    18  Height:      Weight:   65.953 kg (145 lb 6.4 oz)   SpO2:  94% 94% 100%    Intake/Output Summary (Last 24 hours) at 11/08/14 1539 Last data filed at 11/08/14 1300  Gross per 24 hour  Intake    960 ml  Output   2850 ml  Net  -1890 ml    Exam:   General:  Pt is alert, follows commands appropriately, not in acute distress  Cardiovascular: Regular rate and rhythm, S1/S2, no murmurs, no rubs, no gallops  Respiratory: Clear to auscultation bilaterally, mild expiratory wheezing with bilateral rales   Abdomen: Soft, non tender, non distended, bowel sounds present, no guarding  Extremities: +1 bilateral LE edema, pulses DP and PT palpable bilaterally  Neuro: Grossly nonfocal  Data Reviewed: Basic Metabolic Panel:  Recent Labs Lab 11/04/14 11/06/14 1453 11/07/14 0425 11/08/14 0425  NA 137 139 132* 139  K 4.1 4.0 3.9 3.6  CL  --  100 100 104  CO2  --  27 23 27   GLUCOSE  --  100* 119* 84  BUN 36* 32* 28* 25*  CREATININE 1.2* 1.24* 1.22* 1.20*  CALCIUM  --  9.8 8.6 9.0   Liver Function Tests:  Recent Labs Lab 11/06/14 1453 11/07/14 0425  AST 24 20  ALT 25 18  ALKPHOS 80 63  BILITOT 1.0 0.4  PROT 6.6 5.6*  ALBUMIN 3.8 3.1*   CBC:  Recent Labs Lab 11/04/14 11/06/14 1453 11/07/14 0425 11/08/14 0425  WBC 15.4 11.6* 9.9 10.5  NEUTROABS   --  9.0* 8.7*  --   HGB 9.5* 10.6* 9.1* 8.7*  HCT 28* 34.3* 28.6* 27.9*  MCV  --  89.6 88.8 89.4  PLT 320 382 279 271   Cardiac Enzymes:  Recent Labs Lab 11/06/14 1557 11/06/14 2249 11/07/14 0425 11/07/14 1018  CKTOTAL  --  52 44 34  CKMB  --  3.6 4.0 5.1*  TROPONINI 0.04* 0.05* 0.04* 0.03    Recent Results (from the past 240 hour(s))  MRSA PCR Screening     Status: None   Collection Time: 11/08/14  8:00 AM  Result Value Ref Range Status   MRSA by PCR NEGATIVE NEGATIVE Final    Scheduled Meds: . albuterol  2.5 mg Nebulization QID  . aspirin EC  81 mg Oral Daily  . atorvastatin  10 mg Oral q1800  . azithromycin  500 mg Intravenous Q24H  . buPROPion  150 mg Oral Daily  . carvedilol  3.125 mg Oral BID WC  . cefTRIAXone IV  1 g Intravenous Q24H  . digoxin  0.125 mg Oral Daily  . enoxaparin  injection  30 mg Subcutaneous Q24H  . fluticasone  2 spray Each Nare Daily  . furosemide  40 mg Oral Daily  . guaiFENesin  600 mg Oral BID  . levothyroxine  75 mcg Oral QAC breakfast  . loratadine  10 mg Oral Daily  . pantoprazole  40 mg Oral Daily  . pregabalin  50 mg Oral QHS  . ursodiol  300 mg Oral BID   Continuous Infusions: . sodium chloride Stopped (11/06/14 1701)

## 2014-11-08 NOTE — Evaluation (Signed)
Physical Therapy Evaluation Patient Details Name: Debra Brooks MRN: [id number] DOB: 16-Jan-1925 Today's Date: 11/08/2014   History of Present Illness  81 yo female with hx fo cad, CHF(EF45-50%), apparently was recently diagnosed with pneumonia about 5 days ago at [facility] by Hollace Kinnier, and tx with doxycycline and when she was not improving she was tx with levaquin starting yesterday which made her feel sick.  Pt notes dry cough, and slight sob, but denies fever, chills, cp, palp, n/v, diarrhea.  Pt will be admitted for w/up of dyspnea, cough   Clinical Impression  Pt admitted with above diagnosis. Pt currently with functional limitations due to the deficits listed below (see PT Problem List).  Pt will benefit from skilled PT to increase their independence and safety with mobility to allow discharge to the venue listed below.  o2 94% on room air with gait with some SOB noted.  Recommend HHPT to assess pt in home environment.     Follow Up Recommendations Home health PT    Equipment Recommendations  None recommended by PT    Recommendations for Other Services       Precautions / Restrictions Precautions Precautions: Fall Restrictions Weight Bearing Restrictions: No      Mobility  Bed Mobility Overal bed mobility: Modified Independent                Transfers Overall transfer level: Needs assistance   Transfers: Sit to/from Stand Sit to Stand: Supervision            Ambulation/Gait Ambulation/Gait assistance: Min guard Ambulation Distance (Feet): 200 Feet Assistive device: Rolling walker (2 wheeled) Gait Pattern/deviations: Step-through pattern     General Gait Details: RW too high for pt, but she likes it that way so she doesn't stoop over.  Some SOB at end of gait, o2 94% on room air.  Stairs            Wheelchair Mobility    Modified Rankin (Stroke Patients Only)       Balance Overall balance assessment: Needs assistance   Sitting  balance-Leahy Scale: Good       Standing balance-Leahy Scale: Fair                               Pertinent Vitals/Pain Pain Assessment: No/denies pain    Home Living Family/patient expects to be discharged to:: Private residence (Independent living at [facility]) Living Arrangements: Alone   Type of Home: [place]: Walker - 4 wheels;Grab bars - toilet;Grab bars - tub/shower;Shower seat;Cane - single point;Hand held shower head      Prior Function Level of Independence: Independent with assistive device(s)         Comments: yoga, scrabble     Hand Dominance        Extremity/Trunk Assessment   Upper Extremity Assessment: Overall WFL for tasks assessed           Lower Extremity Assessment: Overall WFL for tasks assessed      Cervical / Trunk Assessment: Normal  Communication   Communication: No difficulties  Cognition Arousal/Alertness: Awake/alert Behavior During Therapy: WFL for tasks assessed/performed Overall Cognitive Status: Within Functional Limits for tasks assessed                      General Comments      Exercises General Exercises - Lower Extremity Ankle  Circles/Pumps: AROM;Both;20 reps;Seated Long Arc Quad: Both;Strengthening;10 reps;Seated Hip ABduction/ADduction: Strengthening;Both;10 reps;Seated Hip Flexion/Marching: Strengthening;Both;10 reps;Seated      Assessment/Plan    PT Assessment Patient needs continued PT services  PT Diagnosis Difficulty walking   PT Problem List Decreased activity tolerance;Decreased mobility;Decreased balance  PT Treatment Interventions Gait training;Functional mobility training;Therapeutic activities;Therapeutic exercise   PT Goals (Current goals can be found in the Care Plan section) Acute Rehab PT Goals Patient Stated Goal: To go back to her apartment PT Goal Formulation: With patient Time For Goal Achievement: 11/15/14 Potential  to Achieve Goals: Good    Frequency Min 3X/week   Barriers to discharge        Co-evaluation               End of Session   Activity Tolerance: Patient tolerated treatment well Patient left: in chair;with call bell/phone within reach;Other (comment) (with respiratory therapy) Nurse Communication: Mobility status         Time: 0454-0981 PT Time Calculation (min) (ACUTE ONLY): 26 min   Charges:   PT Evaluation $Initial PT Evaluation Tier I: 1 Procedure PT Treatments $Gait Training: 8-22 mins   PT G Codes:        Debra Brooks 11/08/2014, 11:13 AM

## 2014-11-09 ENCOUNTER — Encounter: Payer: Self-pay | Admitting: Internal Medicine

## 2014-11-09 ENCOUNTER — Other Ambulatory Visit: Payer: Medicare Other

## 2014-11-09 ENCOUNTER — Telehealth: Payer: Self-pay | Admitting: Nurse Practitioner

## 2014-11-09 LAB — UIFE/LIGHT CHAINS/TP QN, 24-HR UR
Albumin, U: DETECTED
Alpha 1, Urine: NOT DETECTED
Alpha 2, Urine: NOT DETECTED
Beta, Urine: NOT DETECTED
GAMMA UR: NOT DETECTED
Total Protein, Urine: 4 mg/dL — ABNORMAL LOW (ref 5–24)

## 2014-11-09 LAB — PROTEIN ELECTROPHORESIS, SERUM
Albumin ELP: 56 % (ref 55.8–66.1)
Alpha-1-Globulin: 6.1 % — ABNORMAL HIGH (ref 2.9–4.9)
Alpha-2-Globulin: 17.2 % — ABNORMAL HIGH (ref 7.1–11.8)
Beta 2: 4.2 % (ref 3.2–6.5)
Beta Globulin: 7.3 % — ABNORMAL HIGH (ref 4.7–7.2)
Gamma Globulin: 9.2 % — ABNORMAL LOW (ref 11.1–18.8)
M-Spike, %: NOT DETECTED g/dL
TOTAL PROTEIN ELP: 5.4 g/dL — AB (ref 6.0–8.3)

## 2014-11-09 MED ORDER — GUAIFENESIN 100 MG/5ML PO SYRP
200.0000 mg | ORAL_SOLUTION | ORAL | Status: DC
  Filled 2014-11-09 (×5): qty 10

## 2014-11-09 MED ORDER — AZITHROMYCIN 500 MG PO TABS
500.0000 mg | ORAL_TABLET | Freq: Every day | ORAL | Status: DC
  Administered 2014-11-09 – 2014-11-10 (×2): 500 mg via ORAL
  Filled 2014-11-09 (×2): qty 1

## 2014-11-09 MED ORDER — GUAIFENESIN 100 MG/5ML PO SOLN
200.0000 mg | ORAL | Status: DC
  Administered 2014-11-09 – 2014-11-11 (×11): 200 mg via ORAL
  Filled 2014-11-09: qty 20
  Filled 2014-11-09 (×13): qty 10

## 2014-11-09 NOTE — Progress Notes (Signed)
Clinical Social Work Department BRIEF PSYCHOSOCIAL ASSESSMENT 11/09/2014  Patient:  Debra Brooks, Debra Brooks     Account Number:  [id number]     Admit date:  11/06/2014  Clinical Social Worker:  Renold Genta  Date/Time:  11/09/2014 01:58 PM  Referred by:  Physician  Date Referred:  11/09/2014 Referred for  Other - See comment   Other Referral:   Admitted from: [facility]   Interview type:  Patient Other interview type:    PSYCHOSOCIAL DATA Living Status:  FACILITY Admitted from facility:  [facility] Level of care:  Independent Living Primary support name:  Ama Mcmaster (step-daughter) c#: [phone] Primary support relationship to patient:  CHILD, ADULT Degree of support available:   good    CURRENT CONCERNS Current Concerns  Post-Acute Placement   Other Concerns:    SOCIAL WORK ASSESSMENT / PLAN CSW received consult that patient was admitted from [place] at [facility].   Assessment/plan status:  Information/Referral to [employer] Other assessment/ plan:   Information/referral to community resources:   CSW completed FL2 and faxed information to [facility], left message for [employer] re: bed availability.    PATIENT'S/FAMILY'S RESPONSE TO PLAN OF CARE: Patient informed CSW that she has been living at [facility] for the past 10 years and has been to their rehab in the past. Patient is very involved in the [employer] - is on [employer] and stays very active within the community.          Raynaldo Opitz, [facility] Clinical Social Worker cell #: [phone]

## 2014-11-09 NOTE — Progress Notes (Signed)
PHARMACIST - PHYSICIAN COMMUNICATION DR:   Doyle Askew CONCERNING: Antibiotic IV to Oral Route Change Policy  RECOMMENDATION: This patient is receiving Azithromycin by the intravenous route.  Based on criteria approved by the Pharmacy and Therapeutics Committee, the antibiotic(s) is/are being converted to the equivalent oral dose form(s).   DESCRIPTION: These criteria include:  Patient being treated for a respiratory tract infection, urinary tract infection, cellulitis or clostridium difficile associated diarrhea if on metronidazole  The patient is not neutropenic and does not exhibit a GI malabsorption state  The patient is eating (either orally or via tube) and/or has been taking other orally administered medications for a least 24 hours  The patient is improving clinically and has a Tmax < 100.5  If you have questions about this conversion, please contact the Pharmacy Department  []   [phone] )  [facility] []   [phone] )  [facility]  []   [phone] )  [facility] [x]   [phone] )  [facility]   Thank you, Peggyann Juba, PharmD, BCPS Pager: [phone] 11/09/2014 2:48 PM

## 2014-11-09 NOTE — Progress Notes (Signed)
Patient ID: Debra Brooks, female   DOB: 1925/06/11, 81 y.o.   MRN: [id number] [facility] PROGRESS NOTE  Debra Brooks OHY:[id number] DOB: August 28, 1925 DOA: 11/06/2014 PCP: Hollace Kinnier, DO   Brief narrative: 81 yo female with hx fo CAD, CHF (EF45-50%), apparently was recently diagnosed with pneumonia about 5 days PTA at [place] by Hollace Kinnier, and tx with doxycycline and when she was not improving she was tx with levaquin starting one day PTA which made her feel sick. Pt notes dry cough, and slight sob, but denies fever, chills, cp, palp, n/v, diarrhea.  Assessment and Plan:    Active Problems:  Acute respiratory failure  - appears to be multifactorial and secondary to ? Viral vs bacterial PNA, ? Mild volume congestion - currently feels better and denies shortness of breath but still wheezing  - will continue treatment with Zithromax and Rocephin day #5 - WBC trending down and is WNL this AM  - also continue lasix for now - provide BD's scheduled and as needed, oxygen as needed, antitussives as needed   Acute on chronic systolic CHF - EF 45 - 50 % per 2 D ECHO in 2013  - continue Lasix, monitor weights, I's and O's - 2 D ECHO with normal EF 60 - 65% - weight stable at 146 lbs (weight 147 lbs on admission)   Bradycardia  - night of 1/11, dose of Coreg lowered to 3.75 BID from 12.5 mg BID - pt stable, HR stable, no events on tele - d/c tele   Chronic kidney disease (CKD), stage III (moderate) - continue to monitor renal function with daily BMP - hold Losartan until renal function stabilizes   Anemia, iron deficiency - no signs of active bleeding - repeat CBC in AM  HTN - stable on home medical regimen  Hypothyroidism  - continue synthroid   DVT prophylaxis  Lovenox SQ while pt is in hospital  Code Status: Full Family Communication: Pt at bedside, son over the phone  Disposition Plan: Remains inpatient   IV access:   Peripheral  IV Procedures and diagnostic studies;  CXR 11/06/2014 No acute cardiopulmonary dz. Age- indeterminate compression inferior endplate of a mid/lower thoracic vertebral body.  Ct Angio Chest Pe W/cm &/or Wo Cm 11/06/2014 No acute findings. No evidence of pulmonary embolism.  Medical consultants:   None  Other consultants:   None  Anti-infectives:   Zithromax 1/9 -->  Rocephin 1/9 -->   Faye Ramsay, MD  [facility] Pager [phone]  If 7PM-7AM, please contact night-coverage [email or web address] Password [email or web address] 11/09/2014, 11:09 AM   LOS: 3 days   HPI/Subjective: No events overnight.   Objective: Filed Vitals:   11/08/14 2143 11/09/14 0549 11/09/14 0556 11/09/14 1039  BP: 123/53 119/41    Pulse: 63 64    Temp: 97.7 F (36.5 C) 98.7 F (37.1 C)    TempSrc: Oral Oral    Resp: 20 20    Height:      Weight:   66.497 kg (146 lb 9.6 oz)   SpO2: 96% 94%  98%    Intake/Output Summary (Last 24 hours) at 11/09/14 1109 Last data filed at 11/09/14 1035  Gross per 24 hour  Intake   1020 ml  Output   2800 ml  Net  -1780 ml    Exam:   General:  Pt is alert, follows commands appropriately, not in acute distress  Cardiovascular: Regular rate and rhythm, S1/S2, no murmurs, no rubs, no gallops  Respiratory: Clear to  auscultation bilaterally, no wheezing, mild bilateral rales   Abdomen: Soft, non tender, non distended, bowel sounds present, no guarding  Extremities: +1 bilateral LE pitting edema, pulses DP and PT palpable bilaterally  Neuro: Grossly nonfocal   Data Reviewed: Basic Metabolic Panel:  Recent Labs Lab 11/04/14 11/06/14 1453 11/07/14 0425 11/08/14 0425  NA 137 139 132* 139  K 4.1 4.0 3.9 3.6  CL  --  100 100 104  CO2  --  27 23 27   GLUCOSE  --  100* 119* 84  BUN 36* 32* 28* 25*  CREATININE 1.2* 1.24* 1.22* 1.20*  CALCIUM  --  9.8 8.6 9.0   Liver Function Tests:  Recent Labs Lab 11/06/14 1453 11/07/14 0425  AST 24 20  ALT 25 18  ALKPHOS 80  63  BILITOT 1.0 0.4  PROT 6.6 5.6*  ALBUMIN 3.8 3.1*   CBC:  Recent Labs Lab 11/04/14 11/06/14 1453 11/07/14 0425 11/08/14 0425  WBC 15.4 11.6* 9.9 10.5  NEUTROABS  --  9.0* 8.7*  --   HGB 9.5* 10.6* 9.1* 8.7*  HCT 28* 34.3* 28.6*  29.1* 27.9*  MCV  --  89.6 88.8 89.4  PLT 320 382 279 271   Cardiac Enzymes:  Recent Labs Lab 11/06/14 1557 11/06/14 2249 11/07/14 0425 11/07/14 1018  CKTOTAL  --  52 44 34  CKMB  --  3.6 4.0 5.1*  TROPONINI 0.04* 0.05* 0.04* 0.03   Recent Results (from the past 240 hour(s))  MRSA PCR Screening     Status: None   Collection Time: 11/08/14  8:00 AM  Result Value Ref Range Status   MRSA by PCR NEGATIVE NEGATIVE Final    Scheduled Meds: . albuterol  2.5 mg Nebulization TID  . aspirin EC  81 mg Oral Daily  . atorvastatin  10 mg Oral q1800  . azithromycin  500 mg Intravenous Q24H  . buPROPion  150 mg Oral Daily  . carvedilol  3.125 mg Oral BID WC  . cefTRIAXone  IV  1 g Intravenous Q24H  . digoxin  0.125 mg Oral Daily  . enoxaparin  injection  30 mg Subcutaneous Q24H  . fluticasone  2 spray Each Nare Daily  . furosemide  40 mg Oral Daily  . guaiFENesin  600 mg Oral BID  . levothyroxine  75 mcg Oral QAC breakfast  . loratadine  10 mg Oral Daily  . pantoprazole  40 mg Oral Daily  . pregabalin  50 mg Oral QHS  . ursodiol  300 mg Oral BID   Continuous Infusions: . sodium chloride Stopped (11/06/14 1701)

## 2014-11-09 NOTE — Telephone Encounter (Signed)
New message      Pt is in [facility]---rm 1410.  She is being treated for pneumonia.  She wanted Cecille Rubin to know and look at her records and call her son--David Huey Romans [phone] and tell him about her condition.  Patient says it is complicated so she can talk to [place].

## 2014-11-09 NOTE — Telephone Encounter (Signed)
I have looked at [name] chart - looks to be admitted with URI/bronchitis/diastolic HF. Son updated. Will see back in the office as needed.

## 2014-11-10 ENCOUNTER — Inpatient Hospital Stay (HOSPITAL_COMMUNITY): Payer: Medicare Other

## 2014-11-10 LAB — BASIC METABOLIC PANEL
ANION GAP: 7 (ref 5–15)
BUN: 24 mg/dL — ABNORMAL HIGH (ref 6–23)
CALCIUM: 9.4 mg/dL (ref 8.4–10.5)
CO2: 29 mmol/L (ref 19–32)
Chloride: 104 mEq/L (ref 96–112)
Creatinine, Ser: 1.23 mg/dL — ABNORMAL HIGH (ref 0.50–1.10)
GFR calc Af Amer: 44 mL/min — ABNORMAL LOW (ref >90)
GFR calc non Af Amer: 38 mL/min — ABNORMAL LOW (ref >90)
Glucose, Bld: 98 mg/dL (ref 70–99)
POTASSIUM: 3.7 mmol/L (ref 3.5–5.1)
SODIUM: 140 mmol/L (ref 135–145)

## 2014-11-10 LAB — CBC
HEMATOCRIT: 31 % — AB (ref 36.0–46.0)
HEMOGLOBIN: 9.6 g/dL — AB (ref 12.0–15.0)
MCH: 27.7 pg (ref 26.0–34.0)
MCHC: 31 g/dL (ref 30.0–36.0)
MCV: 89.6 fL (ref 78.0–100.0)
Platelets: 317 10*3/uL (ref 150–400)
RBC: 3.46 MIL/uL — AB (ref 3.87–5.11)
RDW: 15.8 % — ABNORMAL HIGH (ref 11.5–15.5)
WBC: 12.2 10*3/uL — ABNORMAL HIGH (ref 4.0–10.5)

## 2014-11-10 MED ORDER — ALBUTEROL SULFATE (2.5 MG/3ML) 0.083% IN NEBU
2.5000 mg | INHALATION_SOLUTION | Freq: Two times a day (BID) | RESPIRATORY_TRACT | Status: DC
  Administered 2014-11-10 – 2014-11-11 (×2): 2.5 mg via RESPIRATORY_TRACT
  Filled 2014-11-10 (×2): qty 3

## 2014-11-10 NOTE — Progress Notes (Signed)
Physical Therapy Treatment Patient Details Name: Debra Brooks MRN: [id number] DOB: 02-18-1925 Today's Date: 11/10/2014    History of Present Illness 81 yo female with hx fo cad, CHF(EF45-50%), apparently was recently diagnosed with pneumonia about 5 days ago at [facility] by Hollace Kinnier, and tx with doxycycline and when she was not improving she was tx with levaquin starting yesterday which made her feel sick.  Pt notes dry cough, and slight sob, but denies fever, chills, cp, palp, n/v, diarrhea.  Pt will be admitted for w/up of dyspnea, cough     PT Comments    Updated d/c plan to rehab portion of [facility] as patient is not at high enough level to care for her self in her independent living apartment.  Follow Up Recommendations  SNF;Other (comment) (rehab portion of [facility])     Equipment Recommendations  None recommended by PT    Recommendations for Other Services       Precautions / Restrictions Precautions Precautions: Fall    Mobility  Bed Mobility Overal bed mobility: Modified Independent                Transfers Overall transfer level: Needs assistance Equipment used: Rolling walker (2 wheeled) Transfers: Sit to/from Stand Sit to Stand: Supervision            Ambulation/Gait Ambulation/Gait assistance: Min guard Ambulation Distance (Feet): 220 Feet Assistive device: Rolling walker (2 wheeled) Gait Pattern/deviations: Step-through pattern     General Gait Details: RW too high for patient, but she likes it that way so she doesn't stoop   Stairs            Wheelchair Mobility    Modified Rankin (Stroke Patients Only)       Balance     Sitting balance-Leahy Scale: Good       Standing balance-Leahy Scale: Fair                      Cognition Arousal/Alertness: Awake/alert Behavior During Therapy: WFL for tasks assessed/performed Overall Cognitive Status: Within Functional Limits for tasks assessed                       Exercises General Exercises - Lower Extremity Ankle Circles/Pumps: AROM;Both;20 reps;Seated Gluteal Sets: 10 reps;Seated;Strengthening Long Arc Quad: Both;Strengthening;10 reps;Seated Hip ABduction/ADduction: Strengthening;Both;10 reps;Seated Hip Flexion/Marching: Strengthening;Both;10 reps;Seated    General Comments        Pertinent Vitals/Pain Pain Assessment: No/denies pain    Home Living                      Prior Function            PT Goals (current goals can now be found in the care plan section) Acute Rehab PT Goals PT Goal Formulation: With patient Time For Goal Achievement: 11/15/14 Potential to Achieve Goals: Good Progress towards PT goals: Progressing toward goals    Frequency  Min 3X/week    PT Plan Discharge plan needs to be updated    Co-evaluation             End of Session   Activity Tolerance: Patient tolerated treatment well Patient left: in bed (sitting EOB)     Time: 9767-3419 PT Time Calculation (min) (ACUTE ONLY): 27 min  Charges:  $Gait Training: 8-22 mins $Therapeutic Exercise: 8-22 mins                    G  Codes:      Dwayn Moravek LUBECK 11/10/2014, 11:24 AM

## 2014-11-10 NOTE — Progress Notes (Signed)
Patient ID: Debra Brooks, female   DOB: 12-Mar-1925, 81 y.o.   MRN: [id number]  [facility] PROGRESS NOTE  GITA DILGER GYJ:[id number] DOB: 24-Aug-1925 DOA: 11/06/2014 PCP: Hollace Kinnier, DO  Brief narrative: 81 yo female with hx fo CAD, CHF (EF45-50%), apparently was recently diagnosed with pneumonia about 5 days PTA at [place] by Hollace Kinnier, and tx with doxycycline and when she was not improving she was tx with levaquin starting one day PTA which made her feel sick. Pt notes dry cough, and slight sob, but denies fever, chills, cp, palp, n/v, diarrhea.  Assessment and Plan:    Active Problems:  Acute respiratory failure  - appears to be multifactorial and secondary to ? Viral vs bacterial PNA, ? Mild volume congestion - currently feels better and denies shortness of breath but still wheezing  - will continue treatment with Zithromax and Rocephin day #6/7 - WBC slightly up this AM, CXR requested for follow up - also continue lasix for now - provide BD's scheduled and as needed, oxygen as needed, antitussives as needed   Acute on chronic systolic CHF - EF 45 - 50 % per 2 D ECHO in 2013  - continue Lasix, monitor weights, I's and O's - 2 D ECHO with normal EF 60 - 65% - weight stable at 144 lbs (weight 147 lbs on admission)   Bradycardia  - night of 1/11, dose of Coreg lowered to 3.75 BID from 12.5 mg BID - pt stable, HR stable, no events on tele - d/c tele   Chronic kidney disease (CKD), stage III (moderate) - continue to monitor renal function with daily BMP - hold Losartan until renal function stabilizes   Anemia, iron deficiency - no signs of active bleeding - repeat CBC in AM  HTN - stable on home medical regimen but please note Losartan has been held and Coreg dose decreased as noted above   Hypothyroidism  - continue synthroid   DVT prophylaxis  Lovenox SQ while pt is in hospital  Code Status: Full Family Communication: Pt at bedside, son  over the phone  Disposition Plan: Remains inpatient   IV access:   Peripheral IV Procedures and diagnostic studies;  CXR 11/06/2014 No acute cardiopulmonary dz. Age- indeterminate compression inferior endplate of a mid/lower thoracic vertebral body.  Ct Angio Chest Pe W/cm &/or Wo Cm 11/06/2014 No acute findings. No evidence of pulmonary embolism.  Medical consultants:   None  Other consultants:   None  Anti-infectives:   Zithromax 1/9 -->  Rocephin 1/9 -->  Faye Ramsay, MD  [facility] Pager [phone]  If 7PM-7AM, please contact night-coverage [email or web address] Password [email or web address] 11/10/2014, 3:38 PM   LOS: 4 days   HPI/Subjective: No events overnight.   Objective: Filed Vitals:   11/09/14 2101 11/10/14 0439 11/10/14 0801 11/10/14 1521  BP:  131/65  107/62  Pulse:  71  69  Temp:  97.7 F (36.5 C)  97.9 F (36.6 C)  TempSrc:  Oral  Oral  Resp:  20  18  Height:      Weight:  65.59 kg (144 lb 9.6 oz)    SpO2: 94% 97% 97% 100%    Intake/Output Summary (Last 24 hours) at 11/10/14 1538 Last data filed at 11/10/14 1200  Gross per 24 hour  Intake    530 ml  Output   3201 ml  Net  -2671 ml    Exam:   General:  Pt is alert, follows commands appropriately, not in acute distress  Cardiovascular: Regular rate and rhythm,, no rubs, no gallops  Respiratory: Clear to auscultation bilaterally, no wheezing, mild rales at bases   Abdomen: Soft, non tender, non distended, bowel sounds present, no guarding  Extremities: +1 LE edema, pulses DP and PT palpable bilaterally  Neuro: Grossly nonfocal  Data Reviewed: Basic Metabolic Panel:  Recent Labs Lab 11/04/14 11/06/14 1453 11/07/14 0425 11/08/14 0425 11/10/14 0450  NA 137 139 132* 139 140  K 4.1 4.0 3.9 3.6 3.7  CL  --  100 100 104 104  CO2  --  27 23 27 29   GLUCOSE  --  100* 119* 84 98  BUN 36* 32* 28* 25* 24*  CREATININE 1.2* 1.24* 1.22* 1.20* 1.23*  CALCIUM  --  9.8 8.6 9.0 9.4   Liver  Function Tests:  Recent Labs Lab 11/06/14 1453 11/07/14 0425  AST 24 20  ALT 25 18  ALKPHOS 80 63  BILITOT 1.0 0.4  PROT 6.6 5.6*  ALBUMIN 3.8 3.1*   CBC:  Recent Labs Lab 11/04/14 11/06/14 1453 11/07/14 0425 11/08/14 0425 11/10/14 0450  WBC 15.4 11.6* 9.9 10.5 12.2*  NEUTROABS  --  9.0* 8.7*  --   --   HGB 9.5* 10.6* 9.1* 8.7* 9.6*  HCT 28* 34.3* 28.6*  29.1* 27.9* 31.0*  MCV  --  89.6 88.8 89.4 89.6  PLT 320 382 279 271 317   Cardiac Enzymes:  Recent Labs Lab 11/06/14 1557 11/06/14 2249 11/07/14 0425 11/07/14 1018  CKTOTAL  --  52 44 34  CKMB  --  3.6 4.0 5.1*  TROPONINI 0.04* 0.05* 0.04* 0.03   BNP: Invalid input(s): POCBNP CBG: No results for input(s): GLUCAP in the last 168 hours.  Recent Results (from the past 240 hour(s))  MRSA PCR Screening     Status: None   Collection Time: 11/08/14  8:00 AM  Result Value Ref Range Status   MRSA by PCR NEGATIVE NEGATIVE Final    Comment:        The GeneXpert MRSA Assay (FDA approved for NASAL specimens only), is one component of a comprehensive MRSA colonization surveillance program. It is not intended to diagnose MRSA infection nor to guide or monitor treatment for MRSA infections.      Scheduled Meds: . albuterol  2.5 mg Nebulization BID  . aspirin EC  81 mg Oral Daily  . atorvastatin  10 mg Oral q1800  . azithromycin  500 mg Oral QHS  . buPROPion  150 mg Oral Daily  . carvedilol  3.125 mg Oral BID WC  . cefTRIAXone (ROCEPHIN)  IV  1 g Intravenous Q24H  . digoxin  0.125 mg Oral Daily  . enoxaparin (LOVENOX) injection  30 mg Subcutaneous Q24H  . fluticasone  2 spray Each Nare Daily  . furosemide  40 mg Oral Daily  . guaiFENesin  600 mg Oral BID  . guaiFENesin  200 mg Oral 6 times per day  . levothyroxine  75 mcg Oral QAC breakfast  . loratadine  10 mg Oral Daily  . pantoprazole  40 mg Oral Daily  . pregabalin  50 mg Oral QHS  . sodium chloride  3 mL Intravenous Q12H  . sodium chloride  3 mL  Intravenous Q12H  . ursodiol  300 mg Oral BID   Continuous Infusions: . sodium chloride Stopped (11/06/14 1701)

## 2014-11-11 DIAGNOSIS — N183 Chronic kidney disease, stage 3 (moderate): Secondary | ICD-10-CM

## 2014-11-11 LAB — BASIC METABOLIC PANEL
Anion gap: 7 (ref 5–15)
BUN: 21 mg/dL (ref 6–23)
CO2: 29 mmol/L (ref 19–32)
CREATININE: 1.09 mg/dL (ref 0.50–1.10)
Calcium: 9 mg/dL (ref 8.4–10.5)
Chloride: 104 mEq/L (ref 96–112)
GFR calc Af Amer: 51 mL/min — ABNORMAL LOW (ref >90)
GFR, EST NON AFRICAN AMERICAN: 44 mL/min — AB (ref >90)
Glucose, Bld: 99 mg/dL (ref 70–99)
Potassium: 3.4 mmol/L — ABNORMAL LOW (ref 3.5–5.1)
Sodium: 140 mmol/L (ref 135–145)

## 2014-11-11 LAB — CBC
HEMATOCRIT: 27.9 % — AB (ref 36.0–46.0)
HEMOGLOBIN: 8.7 g/dL — AB (ref 12.0–15.0)
MCH: 27.9 pg (ref 26.0–34.0)
MCHC: 31.2 g/dL (ref 30.0–36.0)
MCV: 89.4 fL (ref 78.0–100.0)
PLATELETS: 294 10*3/uL (ref 150–400)
RBC: 3.12 MIL/uL — AB (ref 3.87–5.11)
RDW: 15.8 % — ABNORMAL HIGH (ref 11.5–15.5)
WBC: 11.2 10*3/uL — ABNORMAL HIGH (ref 4.0–10.5)

## 2014-11-11 MED ORDER — AZITHROMYCIN 250 MG PO TABS: 250.0000 mg | ORAL_TABLET | Freq: Every day | ORAL | Status: DC

## 2014-11-11 MED ORDER — CARVEDILOL 3.125 MG PO TABS: 3.1250 mg | ORAL_TABLET | Freq: Two times a day (BID) | ORAL | Status: DC

## 2014-11-11 MED ORDER — GUAIFENESIN 100 MG/5ML PO SOLN: 200.0000 mg | ORAL | Status: DC | PRN

## 2014-11-11 MED ORDER — POTASSIUM CHLORIDE CRYS ER 10 MEQ PO TBCR
10.0000 meq | EXTENDED_RELEASE_TABLET | Freq: Once | ORAL | Status: AC
  Administered 2014-11-11: 10 meq via ORAL
  Filled 2014-11-11: qty 1

## 2014-11-11 MED ORDER — ALPRAZOLAM 0.25 MG PO TABS: ORAL_TABLET | ORAL | Status: DC

## 2014-11-11 NOTE — Progress Notes (Signed)
Patient is set to discharge back to [facility] today. Patient aware & will inform family. Discharge packet given to RN, Robert Bellow. [facility] called for transport.     Raynaldo Opitz, [facility] Clinical Social Worker cell #: [phone]

## 2014-11-11 NOTE — Discharge Instructions (Signed)

## 2014-11-11 NOTE — Discharge Summary (Signed)
Physician Discharge Summary  Debra Brooks IDP:[id number] DOB: Jun 03, 1925 DOA: 11/06/2014  PCP: Hollace Kinnier, DO  Admit date: 11/06/2014 Discharge date: 11/11/2014  Recommendations for Outpatient Follow-up:  1. Pt will need to follow up with PCP in 2-3 weeks post discharge 2. Please obtain BMP to evaluate electrolytes and kidney function 3. Please also check CBC to evaluate Hg and Hct levels 4. Losartan stopped due to elevated cr and soft BP, please resume once clinically indicated  5. Continue Zithromax for 3 more days post discharge 6. Due to bradycardia with HR in 30 - 40's, dose of Coreg changed to 3.125 mg BID  Discharge Diagnoses:  Active Problems:   Chronic kidney disease (CKD), stage III (moderate)   Anemia, iron deficiency   Dyspnea   Hyperglycemia   Cough  Discharge Condition: Stable  Diet recommendation: Heart healthy diet discussed in details   Brief narrative: 81 yo female with hx fo CAD, CHF (EF45-50%), apparently was recently diagnosed with pneumonia about 5 days PTA at [facility] by Hollace Kinnier, and tx with doxycycline and when she was not improving she was tx with levaquin starting one day PTA which made her feel sick. Pt notes dry cough, and slight sob, but denies fever, chills, cp, palp, n/v, diarrhea.  Assessment and Plan:    Active Problems:  Acute respiratory failure  - appears to be multifactorial and secondary to ? Viral vs bacterial PNA, ? Mild volume congestion - currently feels better and denies shortness of breath but still wheezing  - will continue treatment with Zithromax for 3 more days post discharge  - also continue lasix for now  Acute on chronic systolic CHF - EF 45 - 50 % per 2 D ECHO in 2013  - continue Lasix, monitor weights, I's and O's - 2 D ECHO with normal EF 60 - 65% - weight stable at 143 lbs (weight 147 lbs on admission)   Bradycardia  - night of 1/11, dose of Coreg lowered to 3.75 BID from 12.5 mg BID - pt stable,  HR stable, no events on tele - d/c tele   Chronic kidney disease (CKD), stage III (moderate) - Cr now WNL  - hold Losartan   Anemia, iron deficiency - no signs of active bleeding  HTN - stable on home medical regimen but please note Losartan has been held and Coreg dose decreased as noted above   Hypothyroidism  - continue synthroid    Code Status: Full Family Communication: Pt at bedside, son over the phone  Disposition Plan: SNF  IV access:   Peripheral IV Procedures and diagnostic studies;  CXR 11/06/2014 No acute cardiopulmonary dz. Age- indeterminate compression inferior endplate of a mid/lower thoracic vertebral body.  Ct Angio Chest Pe W/cm &/or Wo Cm 11/06/2014 No acute findings. No evidence of pulmonary embolism.  Medical consultants:   None  Other consultants:   None  Anti-infectives:   Zithromax 1/9 --> 3 more days post discharge   Discharge Exam: Filed Vitals:   11/11/14 0553  BP: 105/48  Pulse: 83  Temp: 98.3 F (36.8 C)  Resp: 16   Filed Vitals:   11/10/14 2117 11/10/14 2226 11/11/14 0553 11/11/14 0809  BP:  120/51 105/48   Pulse: 71 72 83   Temp:  98.7 F (37.1 C) 98.3 F (36.8 C)   TempSrc:  Oral Oral   Resp: 18 16 16    Height:      Weight:   64.91 kg (143 lb 1.6 oz)  SpO2:  97% 91% 95%    General: Pt is alert, follows commands appropriately, not in acute distress Cardiovascular: Regular rate and rhythm, S1/S2 +, no murmurs, no rubs, no gallops Respiratory: Clear to auscultation bilaterally, no wheezing, no crackles, no rhonchi Abdominal: Soft, non tender, non distended, bowel sounds +, no guarding  Discharge Instructions  Discharge Instructions    Diet - low sodium heart healthy    Complete by:  As directed      Increase activity slowly    Complete by:  As directed             Medication List    STOP taking these medications        levofloxacin 500 MG tablet  Commonly known as:  LEVAQUIN      losartan 50 MG tablet  Commonly known as:  COZAAR     predniSONE 10 MG tablet  Commonly known as:  DELTASONE      TAKE these medications        ALPRAZolam 0.25 MG tablet  Commonly known as:  XANAX  Take one tablet at bedtime to help sleep . May have additional 1 daily to treat anxiety     aspirin 81 MG tablet  Take 81 mg by mouth daily.     atorvastatin 10 MG tablet  Commonly known as:  LIPITOR  TAKE 1 TABLET ONCE DAILY FOR CHOLESTEROL.     azithromycin 250 MG tablet  Commonly known as:  ZITHROMAX  Take 1 tablet (250 mg total) by mouth daily.     buPROPion 150 MG 24 hr tablet  Commonly known as:  WELLBUTRIN XL  TAKE (1) TABLET DAILY IN THE MORNING.     carvedilol 3.125 MG tablet  Commonly known as:  COREG  Take 1 tablet (3.125 mg total) by mouth 2 (two) times daily with a meal.     digoxin 0.125 MG tablet  Commonly known as:  LANOXIN  Take 0.125 mg by mouth daily.     furosemide 40 MG tablet  Commonly known as:  LASIX  Take 40 mg by mouth daily. Can take another 40 mg as needed     guaiFENesin 100 MG/5ML Soln  Commonly known as:  ROBITUSSIN  Take 10 mLs (200 mg total) by mouth every 4 (four) hours as needed for cough or to loosen phlegm.     ICAPS PO  Take 1 capsule by mouth. Daily     ipratropium-albuterol 0.5-2.5 (3) MG/3ML Soln  Commonly known as:  DUONEB  Take 3 mLs by nebulization every 6 (six) hours as needed.     nitroGLYCERIN 0.4 MG SL tablet  Commonly known as:  NITROSTAT  Place 1 tablet (0.4 mg total) under the tongue every 5 (five) minutes as needed for chest pain.     pantoprazole 40 MG tablet  Commonly known as:  PROTONIX  Take 40 mg by mouth daily. for stomach     polyethylene glycol powder powder  Commonly known as:  GLYCOLAX/MIRALAX  Take 17 g by mouth daily as needed. For constipation. Mix with 6oz. Beverage of choice     pregabalin 50 MG capsule  Commonly known as:  LYRICA  Take one capsule by mouth once daily to reduce neuropathic  pain     SYNTHROID 75 MCG tablet  Generic drug:  levothyroxine  TAKE 1 TABLET ONCE DAILY.     ursodiol 300 MG capsule  Commonly known as:  ACTIGALL  Take 300 mg by mouth 2 (two) times  daily.           Follow-up Information    Follow up with REED, TIFFANY, DO.   Specialty:  Geriatric Medicine   Contact information:   [address] [place] [address] [phone]        The results of significant diagnostics from this hospitalization (including imaging, microbiology, ancillary and laboratory) are listed below for reference.     Microbiology: Recent Results (from the past 240 hour(s))  MRSA PCR Screening     Status: None   Collection Time: 11/08/14  8:00 AM  Result Value Ref Range Status   MRSA by PCR NEGATIVE NEGATIVE Final     Labs: Basic Metabolic Panel:  Recent Labs Lab 11/06/14 1453 11/07/14 0425 11/08/14 0425 11/10/14 0450 11/11/14 0319  NA 139 132* 139 140 140  K 4.0 3.9 3.6 3.7 3.4*  CL 100 100 104 104 104  CO2 27 23 27 29 29   GLUCOSE 100* 119* 84 98 99  BUN 32* 28* 25* 24* 21  CREATININE 1.24* 1.22* 1.20* 1.23* 1.09  CALCIUM 9.8 8.6 9.0 9.4 9.0   Liver Function Tests:  Recent Labs Lab 11/06/14 1453 11/07/14 0425  AST 24 20  ALT 25 18  ALKPHOS 80 63  BILITOT 1.0 0.4  PROT 6.6 5.6*  ALBUMIN 3.8 3.1*   CBC:  Recent Labs Lab 11/06/14 1453 11/07/14 0425 11/08/14 0425 11/10/14 0450 11/11/14 0319  WBC 11.6* 9.9 10.5 12.2* 11.2*  NEUTROABS 9.0* 8.7*  --   --   --   HGB 10.6* 9.1* 8.7* 9.6* 8.7*  HCT 34.3* 28.6*  29.1* 27.9* 31.0* 27.9*  MCV 89.6 88.8 89.4 89.6 89.4  PLT 382 279 271 317 294   Cardiac Enzymes:  Recent Labs Lab 11/06/14 1557 11/06/14 2249 11/07/14 0425 11/07/14 1018  CKTOTAL  --  52 44 34  CKMB  --  3.6 4.0 5.1*  TROPONINI 0.04* 0.05* 0.04* 0.03   SIGNED: Time coordinating discharge: Over 30 minutes  Faye Ramsay, MD  [facility] 11/11/2014, 12:04 PM Pager [phone]  If 7PM-7AM, please  contact night-coverage [email or web address] Password [email or web address]

## 2014-11-12 ENCOUNTER — Encounter: Payer: Self-pay | Admitting: Internal Medicine

## 2014-11-12 DIAGNOSIS — R2681 Unsteadiness on feet: Secondary | ICD-10-CM | POA: Diagnosis not present

## 2014-11-12 DIAGNOSIS — R0602 Shortness of breath: Secondary | ICD-10-CM | POA: Diagnosis not present

## 2014-11-12 DIAGNOSIS — J159 Unspecified bacterial pneumonia: Secondary | ICD-10-CM | POA: Diagnosis not present

## 2014-11-12 DIAGNOSIS — D509 Iron deficiency anemia, unspecified: Secondary | ICD-10-CM | POA: Diagnosis not present

## 2014-11-12 DIAGNOSIS — J13 Pneumonia due to Streptococcus pneumoniae: Secondary | ICD-10-CM | POA: Diagnosis not present

## 2014-11-12 DIAGNOSIS — R0600 Dyspnea, unspecified: Secondary | ICD-10-CM | POA: Diagnosis not present

## 2014-11-12 DIAGNOSIS — M6281 Muscle weakness (generalized): Secondary | ICD-10-CM | POA: Diagnosis not present

## 2014-11-14 DIAGNOSIS — J13 Pneumonia due to Streptococcus pneumoniae: Secondary | ICD-10-CM | POA: Diagnosis not present

## 2014-11-14 DIAGNOSIS — R0602 Shortness of breath: Secondary | ICD-10-CM | POA: Diagnosis not present

## 2014-11-14 DIAGNOSIS — M6281 Muscle weakness (generalized): Secondary | ICD-10-CM | POA: Diagnosis not present

## 2014-11-14 DIAGNOSIS — D509 Iron deficiency anemia, unspecified: Secondary | ICD-10-CM | POA: Diagnosis not present

## 2014-11-14 DIAGNOSIS — R2681 Unsteadiness on feet: Secondary | ICD-10-CM | POA: Diagnosis not present

## 2014-11-14 DIAGNOSIS — J159 Unspecified bacterial pneumonia: Secondary | ICD-10-CM | POA: Diagnosis not present

## 2014-11-15 ENCOUNTER — Non-Acute Institutional Stay (SKILLED_NURSING_FACILITY): Payer: Medicare Other | Admitting: Adult Health

## 2014-11-15 ENCOUNTER — Encounter: Payer: Self-pay | Admitting: Adult Health

## 2014-11-15 DIAGNOSIS — R2681 Unsteadiness on feet: Secondary | ICD-10-CM | POA: Diagnosis not present

## 2014-11-15 DIAGNOSIS — R0602 Shortness of breath: Secondary | ICD-10-CM | POA: Diagnosis not present

## 2014-11-15 DIAGNOSIS — E875 Hyperkalemia: Secondary | ICD-10-CM | POA: Insufficient documentation

## 2014-11-15 DIAGNOSIS — E876 Hypokalemia: Secondary | ICD-10-CM | POA: Insufficient documentation

## 2014-11-15 DIAGNOSIS — J13 Pneumonia due to Streptococcus pneumoniae: Secondary | ICD-10-CM | POA: Diagnosis not present

## 2014-11-15 DIAGNOSIS — I5032 Chronic diastolic (congestive) heart failure: Secondary | ICD-10-CM | POA: Diagnosis not present

## 2014-11-15 DIAGNOSIS — M6281 Muscle weakness (generalized): Secondary | ICD-10-CM | POA: Diagnosis not present

## 2014-11-15 DIAGNOSIS — D509 Iron deficiency anemia, unspecified: Secondary | ICD-10-CM | POA: Diagnosis not present

## 2014-11-15 DIAGNOSIS — J159 Unspecified bacterial pneumonia: Secondary | ICD-10-CM | POA: Diagnosis not present

## 2014-11-15 NOTE — Assessment & Plan Note (Signed)
Noted increased weight by 4 lbs since arrival and increased edema. Stable condition, no SOB, CP, PND, or DOE. Increase Lasix to 80 mg daily for two days until seen by Dr. Mariea Clonts. Continue low sodium diet, daily weights, and CHF education. I went over with her the definition of CHF and s/s that are concerning such as the above mentioned symptoms.

## 2014-11-15 NOTE — Progress Notes (Signed)
Patient ID: Debra Brooks, female   DOB: May 27, 1925, 81 y.o.   MRN: [id number]  Nursing Home Location:  [facility]   Code Status: DNR   Place of Service: SNF (31)  Chief Complaint  Patient presents with  . Acute Visit    increased weight     HPI:  81 y.o. female residing at [facility], rehab section. I was asked to see her today due to weight gain. She has gained 3-4 lbs since her arrival to the rehab section on 11/11/14.  She was in the hospital 11/06/14-11/11/14 due to acute resp failure that was multifactorial secondary to  Pneumonia and CHF.  She was discharged with 3 days of Zithromax which she has completed. She is currently on Lasix 40 mg and denies SOB, DOE, CP, or DOE. She is currently on a heart healthy diet and daily weights. Her echo in 2013 showed an EF of 45-50%. The echo performed during her hospitalization was a technically difficult study due to afib but the ef was noted as normal with a severely elevated PA pressure. She has a hx of grade 1 diastolic fxn but this was not addressed in the most recent echo. She apparently has a long standing hx of CHF but did not know this and is requesting further education regarding her dx.    Review of Systems:  Review of Systems  Constitutional: Positive for unexpected weight change. Negative for activity change and appetite change.  HENT: Negative for congestion, postnasal drip and trouble swallowing.   Respiratory: Negative for cough, shortness of breath and wheezing.   Cardiovascular: Positive for leg swelling. Negative for chest pain and palpitations.       No DOE, SOB, or PND  Gastrointestinal: Negative for constipation and abdominal distention.  Genitourinary: Positive for genital sores.  Neurological: Negative for dizziness, tremors, seizures and weakness.  Psychiatric/Behavioral: Negative for behavioral problems, confusion and agitation.    Medications: Patient's Medications  New  Prescriptions   No medications on file  Previous Medications   ALPRAZOLAM (XANAX) 0.25 MG TABLET    Take one tablet at bedtime to help sleep . May have additional 1 daily to treat anxiety   ASPIRIN 81 MG TABLET    Take 81 mg by mouth daily.     ATORVASTATIN (LIPITOR) 10 MG TABLET    TAKE 1 TABLET ONCE DAILY FOR CHOLESTEROL.   AZITHROMYCIN (ZITHROMAX) 250 MG TABLET    Take 1 tablet (250 mg total) by mouth daily.   BUPROPION (WELLBUTRIN XL) 150 MG 24 HR TABLET    TAKE (1) TABLET DAILY IN THE MORNING.   CARVEDILOL (COREG) 3.125 MG TABLET    Take 1 tablet (3.125 mg total) by mouth 2 (two) times daily with a meal.   DIGOXIN (LANOXIN) 0.125 MG TABLET    Take 0.125 mg by mouth daily.   FUROSEMIDE (LASIX) 40 MG TABLET    Take 40 mg by mouth daily. Can take another 40 mg as needed   GUAIFENESIN (ROBITUSSIN) 100 MG/5ML SOLN    Take 10 mLs (200 mg total) by mouth every 4 (four) hours as needed for cough or to loosen phlegm.   IPRATROPIUM-ALBUTEROL (DUONEB) 0.5-2.5 (3) MG/3ML SOLN    Take 3 mLs by nebulization every 6 (six) hours as needed.   MULTIPLE VITAMINS-MINERALS (ICAPS PO)    Take 1 capsule by mouth. Daily   NITROGLYCERIN (NITROSTAT) 0.4 MG SL TABLET    Place 1 tablet (0.4 mg total) under  the tongue every 5 (five) minutes as needed for chest pain.   PANTOPRAZOLE (PROTONIX) 40 MG TABLET    Take 40 mg by mouth daily. for stomach   POLYETHYLENE GLYCOL POWDER (GLYCOLAX/MIRALAX) POWDER    Take 17 g by mouth daily as needed. For constipation. Mix with 6oz. Beverage of choice   PREGABALIN (LYRICA) 50 MG CAPSULE    Take one capsule by mouth once daily to reduce neuropathic pain   SYNTHROID 75 MCG TABLET    TAKE 1 TABLET ONCE DAILY.   URSODIOL (ACTIGALL) 300 MG CAPSULE    Take 300 mg by mouth 2 (two) times daily.  Modified Medications   No medications on file  Discontinued Medications   No medications on file     Physical Exam:  Filed Vitals:   11/15/14 1352  BP: 127/66  Pulse: 67  Temp: 97.5 F  (36.4 C)  Resp: 20  Weight: 148 lb 3.2 oz (67.223 kg)  SpO2: 97%    Physical Exam  Constitutional: She is oriented to person, place, and time. No distress.  frail  Neck: No JVD present.  Cardiovascular:  No murmur heard. Irregular, BLE edema +3  Pulmonary/Chest: Effort normal and breath sounds normal. No respiratory distress. She has no wheezes.  Abdominal: Soft. Bowel sounds are normal. She exhibits no distension.  Lymphadenopathy:    She has no cervical adenopathy.  Neurological: She is alert and oriented to person, place, and time.  Skin: Skin is warm and dry. She is not diaphoretic.  Psychiatric: Affect normal.    Labs reviewed/Significant Diagnostic Results:  2D echo 02/05/12  Study Conclusions  - Left ventricle: Inferobasal hypokinesis and abnormal   septal motion The cavity size was normal. Wall thickness   was increased in a pattern of mild LVH. Systolic function   was mildly reduced. The estimated ejection fraction was in   the range of 45% to 50%. - Atrial septum: No defect or patent foramen ovale was   identified. - Pulmonary arteries: PA peak pressure: 45mm Hg (S).  2D echo 11/07/14 Impressions:  - Normal biventricular size and systolic function. Moderate mitral   and tricusid regurgitation.   Unable to estimate filling pressures.   Severe pulmonary hypertension with RVSP 62 mmHg.  Basic Metabolic Panel:  Recent Labs  11/08/14 0425 11/10/14 0450 11/11/14 0319  NA 139 140 140  K 3.6 3.7 3.4*  CL 104 104 104  CO2 27 29 29   GLUCOSE 84 98 99  BUN 25* 24* 21  CREATININE 1.20* 1.23* 1.09  CALCIUM 9.0 9.4 9.0   Liver Function Tests:  Recent Labs  11/06/14 1453 11/07/14 0425  AST 24 20  ALT 25 18  ALKPHOS 80 63  BILITOT 1.0 0.4  PROT 6.6 5.6*  ALBUMIN 3.8 3.1*   No results for input(s): LIPASE, AMYLASE in the last 8760 hours. No results for input(s): AMMONIA in the last 8760 hours. CBC:  Recent Labs  11/06/14 1453 11/07/14 0425  11/08/14 0425 11/10/14 0450 11/11/14 0319  WBC 11.6* 9.9 10.5 12.2* 11.2*  NEUTROABS 9.0* 8.7*  --   --   --   HGB 10.6* 9.1* 8.7* 9.6* 8.7*  HCT 34.3* 28.6*  29.1* 27.9* 31.0* 27.9*  MCV 89.6 88.8 89.4 89.6 89.4  PLT 382 279 271 317 294   CBG: No results for input(s): GLUCAP in the last 8760 hours. TSH: No results for input(s): TSH in the last 8760 hours. A1C: No results found for: HGBA1C Lipid Panel:  Recent  Labs  04/08/14 1632 07/08/14  CHOL 141 127  HDL 62.20 55  LDLCALC 48 65  TRIG 153.0* 158  CHOLHDL 2  --      Assessment/Plan CHF (congestive heart failure) Noted increased weight by 4 lbs since arrival and increased edema. Stable condition, no SOB, CP, PND, or DOE. Increase Lasix to 80 mg daily for two days until seen by Dr. Mariea Clonts. Continue low sodium diet, daily weights, and CHF education. I went over with her the definition of CHF and s/s that are concerning such as the above mentioned symptoms.   Hypokalemia K 3.4 on discharge from the hospital on 1/16.  Will need f/u BMP at discharge. Kdur 40 MEQ  today and tomorrow due to increase lasix dose     Cindi Carbon, ANP [facility] [phone]

## 2014-11-15 NOTE — Assessment & Plan Note (Signed)
K 3.4 on discharge from the hospital on 1/16.  Will need f/u BMP at discharge. Kdur 40 MEQ  today and tomorrow due to increase lasix dose

## 2014-11-16 ENCOUNTER — Encounter: Payer: Self-pay | Admitting: Internal Medicine

## 2014-11-16 ENCOUNTER — Non-Acute Institutional Stay (SKILLED_NURSING_FACILITY): Payer: Medicare Other | Admitting: Internal Medicine

## 2014-11-16 DIAGNOSIS — M6281 Muscle weakness (generalized): Secondary | ICD-10-CM | POA: Diagnosis not present

## 2014-11-16 DIAGNOSIS — I5032 Chronic diastolic (congestive) heart failure: Secondary | ICD-10-CM

## 2014-11-16 DIAGNOSIS — R635 Abnormal weight gain: Secondary | ICD-10-CM | POA: Diagnosis not present

## 2014-11-16 DIAGNOSIS — J189 Pneumonia, unspecified organism: Secondary | ICD-10-CM | POA: Diagnosis not present

## 2014-11-16 DIAGNOSIS — N183 Chronic kidney disease, stage 3 unspecified: Secondary | ICD-10-CM

## 2014-11-16 DIAGNOSIS — I2510 Atherosclerotic heart disease of native coronary artery without angina pectoris: Secondary | ICD-10-CM

## 2014-11-16 DIAGNOSIS — R0602 Shortness of breath: Secondary | ICD-10-CM | POA: Diagnosis not present

## 2014-11-16 DIAGNOSIS — I4892 Unspecified atrial flutter: Secondary | ICD-10-CM | POA: Diagnosis not present

## 2014-11-16 DIAGNOSIS — E876 Hypokalemia: Secondary | ICD-10-CM | POA: Diagnosis not present

## 2014-11-16 DIAGNOSIS — D509 Iron deficiency anemia, unspecified: Secondary | ICD-10-CM | POA: Diagnosis not present

## 2014-11-16 DIAGNOSIS — J13 Pneumonia due to Streptococcus pneumoniae: Secondary | ICD-10-CM | POA: Diagnosis not present

## 2014-11-16 DIAGNOSIS — D649 Anemia, unspecified: Secondary | ICD-10-CM | POA: Diagnosis not present

## 2014-11-16 DIAGNOSIS — R2681 Unsteadiness on feet: Secondary | ICD-10-CM | POA: Diagnosis not present

## 2014-11-16 DIAGNOSIS — I5040 Unspecified combined systolic (congestive) and diastolic (congestive) heart failure: Secondary | ICD-10-CM | POA: Diagnosis not present

## 2014-11-16 DIAGNOSIS — N189 Chronic kidney disease, unspecified: Secondary | ICD-10-CM | POA: Diagnosis not present

## 2014-11-16 DIAGNOSIS — J159 Unspecified bacterial pneumonia: Secondary | ICD-10-CM | POA: Diagnosis not present

## 2014-11-16 MED ORDER — POTASSIUM CHLORIDE CRYS ER 20 MEQ PO TBCR: 20.0000 meq | EXTENDED_RELEASE_TABLET | Freq: Every day | ORAL | Status: DC
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
## 2014-11-17 DIAGNOSIS — R0602 Shortness of breath: Secondary | ICD-10-CM | POA: Diagnosis not present

## 2014-11-17 DIAGNOSIS — D509 Iron deficiency anemia, unspecified: Secondary | ICD-10-CM | POA: Diagnosis not present

## 2014-11-17 DIAGNOSIS — R2681 Unsteadiness on feet: Secondary | ICD-10-CM | POA: Diagnosis not present

## 2014-11-17 DIAGNOSIS — M6281 Muscle weakness (generalized): Secondary | ICD-10-CM | POA: Diagnosis not present

## 2014-11-17 DIAGNOSIS — J13 Pneumonia due to Streptococcus pneumoniae: Secondary | ICD-10-CM | POA: Diagnosis not present

## 2014-11-17 DIAGNOSIS — J159 Unspecified bacterial pneumonia: Secondary | ICD-10-CM | POA: Diagnosis not present

## 2014-11-25 DIAGNOSIS — I5040 Unspecified combined systolic (congestive) and diastolic (congestive) heart failure: Secondary | ICD-10-CM | POA: Diagnosis not present

## 2014-11-25 DIAGNOSIS — N189 Chronic kidney disease, unspecified: Secondary | ICD-10-CM | POA: Diagnosis not present

## 2014-11-25 LAB — BASIC METABOLIC PANEL
BUN: 20 mg/dL (ref 4–21)
Creatinine: 1.1 mg/dL (ref 0.5–1.1)
GLUCOSE: 101 mg/dL
Potassium: 4 mmol/L (ref 3.4–5.3)
SODIUM: 139 mmol/L (ref 137–147)

## 2014-11-30 DIAGNOSIS — K8020 Calculus of gallbladder without cholecystitis without obstruction: Secondary | ICD-10-CM | POA: Diagnosis not present

## 2014-11-30 DIAGNOSIS — D649 Anemia, unspecified: Secondary | ICD-10-CM | POA: Diagnosis not present

## 2014-11-30 DIAGNOSIS — Z85038 Personal history of other malignant neoplasm of large intestine: Secondary | ICD-10-CM | POA: Diagnosis not present

## 2014-12-01 ENCOUNTER — Encounter: Payer: Self-pay | Admitting: Nurse Practitioner

## 2014-12-01 ENCOUNTER — Non-Acute Institutional Stay: Payer: Medicare Other | Admitting: Nurse Practitioner

## 2014-12-01 VITALS — BP 120/64 | HR 80 | Temp 97.7°F | Wt 145.0 lb

## 2014-12-01 DIAGNOSIS — I10 Essential (primary) hypertension: Secondary | ICD-10-CM | POA: Diagnosis not present

## 2014-12-01 DIAGNOSIS — N183 Chronic kidney disease, stage 3 unspecified: Secondary | ICD-10-CM

## 2014-12-01 DIAGNOSIS — I2510 Atherosclerotic heart disease of native coronary artery without angina pectoris: Secondary | ICD-10-CM | POA: Diagnosis not present

## 2014-12-01 DIAGNOSIS — R609 Edema, unspecified: Secondary | ICD-10-CM | POA: Diagnosis not present

## 2014-12-01 DIAGNOSIS — I5032 Chronic diastolic (congestive) heart failure: Secondary | ICD-10-CM

## 2014-12-01 DIAGNOSIS — D509 Iron deficiency anemia, unspecified: Secondary | ICD-10-CM

## 2014-12-01 DIAGNOSIS — Z23 Encounter for immunization: Secondary | ICD-10-CM

## 2014-12-01 DIAGNOSIS — J189 Pneumonia, unspecified organism: Secondary | ICD-10-CM | POA: Diagnosis not present

## 2014-12-01 DIAGNOSIS — R001 Bradycardia, unspecified: Secondary | ICD-10-CM | POA: Diagnosis not present
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
## 2014-12-02 DIAGNOSIS — D509 Iron deficiency anemia, unspecified: Secondary | ICD-10-CM | POA: Diagnosis not present

## 2014-12-02 LAB — CBC AND DIFFERENTIAL
HCT: 29 % — AB (ref 36–46)
HEMOGLOBIN: 9.2 g/dL — AB (ref 12.0–16.0)
PLATELETS: 289 10*3/uL (ref 150–399)
WBC: 5.6 10^3/mL

## 2014-12-05 ENCOUNTER — Other Ambulatory Visit: Payer: Self-pay | Admitting: Internal Medicine

## 2014-12-06 ENCOUNTER — Telehealth: Payer: Self-pay | Admitting: Nurse Practitioner

## 2014-12-06 ENCOUNTER — Telehealth: Payer: Self-pay

## 2014-12-06 NOTE — Telephone Encounter (Signed)
Called patient with lab results from 12/02/14, Hgb is low 9.2, but has come up from 8.7 in hospital 11/11/14.  Dr. Cristina Gong, put her on iron tablets and gave her some hemoccult cards to do. Would like for [place] to fax the lab to Dr. Cristina Gong. Fax to [phone].

## 2014-12-06 NOTE — Telephone Encounter (Signed)
Please let her know that I reviewed the records - I agree with the medicine changes. Would like for her to track her BP's if possible.   Ok to see as planned but have her call me if needed.

## 2014-12-06 NOTE — Telephone Encounter (Signed)
S/w pt today, wants Truitt Merle, NP, to pt just got out of the hospital and wanted to make sure Cecille Rubin knew of the medication changes.  Stopped Losartan and decreased Coreg ( 3.125) bid.  Pt wanted Cecille Rubin to reassure pt that this medication change was OK.  Also wanted Cecille Rubin to know pt wanted to see Cecille Rubin sooner than the February 24th appointment.  Pt received a folder when pt left the hospital stating how to live with Heart Failure.  Pt stated did not know had heart failure.  I reassured pt I will talk with Cecille Rubin and call pt back today.

## 2014-12-06 NOTE — Telephone Encounter (Signed)
New message     Patient calling    Recently in the hospital .   hospital ist changed medication - would like to discuss this matter.  Wants to be reassure.

## 2014-12-06 NOTE — Telephone Encounter (Signed)
Pt know that's Debra Brooks reviewed records and agreed - Pt will try and keep track of bp gets the bp checked every Tuesday at [facility]. Keep appointment. Pt stated appreciation.

## 2014-12-08 ENCOUNTER — Other Ambulatory Visit: Payer: Self-pay | Admitting: *Deleted

## 2014-12-08 MED ORDER — CARVEDILOL 3.125 MG PO TABS: 3.1250 mg | ORAL_TABLET | Freq: Two times a day (BID) | ORAL | Status: DC

## 2014-12-08 NOTE — Telephone Encounter (Signed)
[facility]  

## 2014-12-13 DIAGNOSIS — D649 Anemia, unspecified: Secondary | ICD-10-CM | POA: Diagnosis not present

## 2014-12-22 ENCOUNTER — Ambulatory Visit (INDEPENDENT_AMBULATORY_CARE_PROVIDER_SITE_OTHER): Payer: Medicare Other | Admitting: Nurse Practitioner

## 2014-12-22 VITALS — BP 124/56 | HR 72 | Wt 148.8 lb

## 2014-12-22 DIAGNOSIS — N183 Chronic kidney disease, stage 3 unspecified: Secondary | ICD-10-CM

## 2014-12-22 DIAGNOSIS — I5032 Chronic diastolic (congestive) heart failure: Secondary | ICD-10-CM

## 2014-12-22 DIAGNOSIS — I2510 Atherosclerotic heart disease of native coronary artery without angina pectoris: Secondary | ICD-10-CM
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
## 2014-12-22 NOTE — Patient Instructions (Signed)
Monitor your blood pressure for me  Let me know if BP starts going over 140 or 150 or higher  Stay on your current medicines  I will see you back in June as planned.

## 2014-12-30 DIAGNOSIS — R1900 Intra-abdominal and pelvic swelling, mass and lump, unspecified site: Secondary | ICD-10-CM | POA: Diagnosis not present

## 2015-01-04 ENCOUNTER — Other Ambulatory Visit: Payer: Self-pay | Admitting: Internal Medicine

## 2015-01-18 DIAGNOSIS — E785 Hyperlipidemia, unspecified: Secondary | ICD-10-CM | POA: Diagnosis not present

## 2015-01-18 DIAGNOSIS — D509 Iron deficiency anemia, unspecified: Secondary | ICD-10-CM | POA: Diagnosis not present

## 2015-01-18 DIAGNOSIS — I10 Essential (primary) hypertension: Secondary | ICD-10-CM | POA: Diagnosis not present

## 2015-01-18 DIAGNOSIS — E039 Hypothyroidism, unspecified: Secondary | ICD-10-CM | POA: Diagnosis not present

## 2015-01-18 LAB — HEPATIC FUNCTION PANEL
ALT: 13 U/L (ref 7–35)
AST: 17 U/L (ref 13–35)
Alkaline Phosphatase: 58 U/L (ref 25–125)
Bilirubin, Total: 0.6 mg/dL

## 2015-01-18 LAB — LIPID PANEL
Cholesterol: 132 mg/dL (ref 0–200)
HDL: 59 mg/dL (ref 35–70)
LDL Cholesterol: 63 mg/dL
LDl/HDL Ratio: 1.1
TRIGLYCERIDES: 127 mg/dL (ref 40–160)

## 2015-01-18 LAB — BASIC METABOLIC PANEL
BUN: 24 mg/dL — AB (ref 4–21)
Creatinine: 1.2 mg/dL — AB (ref 0.5–1.1)
Glucose: 86 mg/dL
Potassium: 4.2 mmol/L (ref 3.4–5.3)
Sodium: 142 mmol/L (ref 137–147)

## 2015-01-18 LAB — CBC AND DIFFERENTIAL
HEMATOCRIT: 32 % — AB (ref 36–46)
HEMOGLOBIN: 10 g/dL — AB (ref 12.0–16.0)
Platelets: 274 10*3/uL (ref 150–399)
WBC: 6.4 10*3/mL

## 2015-01-18 LAB — TSH: TSH: 1.81 u[IU]/mL (ref 0.41–5.90)

## 2015-01-24 ENCOUNTER — Encounter: Payer: Self-pay | Admitting: Internal Medicine

## 2015-01-25 ENCOUNTER — Non-Acute Institutional Stay: Payer: Medicare Other | Admitting: Internal Medicine

## 2015-01-25 ENCOUNTER — Encounter: Payer: Self-pay | Admitting: Internal Medicine

## 2015-01-25 VITALS — BP 110/62 | HR 76 | Temp 97.7°F | Ht 65.5 in | Wt 145.0 lb

## 2015-01-25 DIAGNOSIS — D509 Iron deficiency anemia, unspecified: Secondary | ICD-10-CM

## 2015-01-25 DIAGNOSIS — Z7189 Other specified counseling: Secondary | ICD-10-CM | POA: Diagnosis not present

## 2015-01-25 DIAGNOSIS — R269 Unspecified abnormalities of gait and mobility: Secondary | ICD-10-CM

## 2015-01-25 DIAGNOSIS — I5032 Chronic diastolic (congestive) heart failure: Secondary | ICD-10-CM | POA: Diagnosis not present

## 2015-01-25 DIAGNOSIS — I2510 Atherosclerotic heart disease of native coronary artery without angina pectoris: Secondary | ICD-10-CM | POA: Diagnosis not present

## 2015-01-25 DIAGNOSIS — R609 Edema, unspecified: Secondary | ICD-10-CM

## 2015-01-25 DIAGNOSIS — N183 Chronic kidney disease, stage 3 unspecified: Secondary | ICD-10-CM
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
## 2015-01-25 NOTE — Progress Notes (Signed)
Passed clock drawing 

## 2015-01-31 ENCOUNTER — Other Ambulatory Visit: Payer: Self-pay | Admitting: Internal Medicine

## 2015-01-31 ENCOUNTER — Other Ambulatory Visit: Payer: Self-pay | Admitting: Nurse Practitioner

## 2015-02-10 ENCOUNTER — Encounter: Payer: Self-pay | Admitting: Internal Medicine

## 2015-02-11 DIAGNOSIS — L821 Other seborrheic keratosis: Secondary | ICD-10-CM | POA: Diagnosis not present

## 2015-02-11 DIAGNOSIS — B078 Other viral warts: Secondary | ICD-10-CM | POA: Diagnosis not present

## 2015-02-11 DIAGNOSIS — D1801 Hemangioma of skin and subcutaneous tissue: Secondary | ICD-10-CM | POA: Diagnosis not present

## 2015-02-11 DIAGNOSIS — L814 Other melanin hyperpigmentation: Secondary | ICD-10-CM | POA: Diagnosis not present

## 2015-02-15 ENCOUNTER — Encounter: Payer: Self-pay | Admitting: Internal Medicine

## 2015-03-03 ENCOUNTER — Other Ambulatory Visit: Payer: Self-pay | Admitting: Internal Medicine

## 2015-03-03 DIAGNOSIS — D649 Anemia, unspecified: Secondary | ICD-10-CM | POA: Diagnosis not present

## 2015-03-03 LAB — CBC AND DIFFERENTIAL
HCT: 36 % (ref 36–46)
HEMOGLOBIN: 11.2 g/dL — AB (ref 12.0–16.0)
PLATELETS: 236 10*3/uL (ref 150–399)
WBC: 6.1 10*3/mL

## 2015-03-03 MED ORDER — PREGABALIN 50 MG PO CAPS: ORAL_CAPSULE | ORAL | Status: DC

## 2015-03-03 NOTE — Telephone Encounter (Signed)
[facility]  

## 2015-03-04 ENCOUNTER — Other Ambulatory Visit: Payer: Self-pay | Admitting: *Deleted

## 2015-03-04 ENCOUNTER — Other Ambulatory Visit: Payer: Self-pay | Admitting: Cardiology

## 2015-03-04 MED ORDER — ALPRAZOLAM 0.25 MG PO TABS: ORAL_TABLET | ORAL | Status: DC

## 2015-03-04 NOTE — Telephone Encounter (Signed)
[facility]  

## 2015-03-08 DIAGNOSIS — D509 Iron deficiency anemia, unspecified: Secondary | ICD-10-CM | POA: Diagnosis not present

## 2015-03-09 ENCOUNTER — Other Ambulatory Visit: Payer: Self-pay

## 2015-04-06 ENCOUNTER — Other Ambulatory Visit: Payer: Self-pay | Admitting: Nurse Practitioner

## 2015-04-12 ENCOUNTER — Ambulatory Visit (INDEPENDENT_AMBULATORY_CARE_PROVIDER_SITE_OTHER): Payer: Medicare Other | Admitting: Nurse Practitioner

## 2015-04-12 ENCOUNTER — Encounter: Payer: Self-pay | Admitting: Nurse Practitioner

## 2015-04-12 VITALS — BP 112/62 | HR 64 | Ht 66.5 in | Wt 147.1 lb

## 2015-04-12 DIAGNOSIS — I2510 Atherosclerotic heart disease of native coronary artery without angina pectoris: Secondary | ICD-10-CM | POA: Diagnosis not present

## 2015-04-12 DIAGNOSIS — I6523 Occlusion and stenosis of bilateral carotid arteries: Secondary | ICD-10-CM | POA: Diagnosis not present

## 2015-04-12 DIAGNOSIS — I4892 Unspecified atrial flutter: Secondary | ICD-10-CM | POA: Diagnosis not present

## 2015-04-12 DIAGNOSIS — I5032 Chronic diastolic (congestive) heart failure: Secondary | ICD-10-CM | POA: Diagnosis not present
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
## 2015-04-12 NOTE — Patient Instructions (Addendum)
We will be checking the following labs today - NONE   Medication Instructions:    Continue with your current medicines.     Testing/Procedures To Be Arranged:  We will get your carotid doppler study updated - ok to do on return OV with me  Follow-Up:   I will see you back in 4 months with fasting labs and carotid doppler.    Other Special Instructions:   N/A  Call the [place] office at [phone] if you have any questions, problems or concerns.

## 2015-04-19 DIAGNOSIS — Z961 Presence of intraocular lens: Secondary | ICD-10-CM | POA: Insufficient documentation

## 2015-04-19 DIAGNOSIS — H3531 Nonexudative age-related macular degeneration: Secondary | ICD-10-CM | POA: Diagnosis not present

## 2015-04-19 DIAGNOSIS — H353114 Nonexudative age-related macular degeneration, right eye, advanced atrophic with subfoveal involvement: Secondary | ICD-10-CM | POA: Insufficient documentation

## 2015-05-10 ENCOUNTER — Other Ambulatory Visit: Payer: Self-pay | Admitting: Internal Medicine

## 2015-05-10 ENCOUNTER — Other Ambulatory Visit: Payer: Self-pay | Admitting: Cardiology

## 2015-05-17 DIAGNOSIS — D509 Iron deficiency anemia, unspecified: Secondary | ICD-10-CM | POA: Diagnosis not present

## 2015-05-17 DIAGNOSIS — N183 Chronic kidney disease, stage 3 (moderate): Secondary | ICD-10-CM | POA: Diagnosis not present

## 2015-05-17 LAB — BASIC METABOLIC PANEL
BUN: 29 mg/dL — AB (ref 4–21)
Creatinine: 1.2 mg/dL — AB (ref 0.5–1.1)
Glucose: 91 mg/dL
Potassium: 4 mmol/L (ref 3.4–5.3)
Sodium: 141 mmol/L (ref 137–147)

## 2015-05-17 LAB — CBC AND DIFFERENTIAL
HCT: 37 % (ref 36–46)
Hemoglobin: 12.3 g/dL (ref 12.0–16.0)
Platelets: 230 10*3/uL (ref 150–399)
WBC: 7.4 10*3/mL

## 2015-05-24 ENCOUNTER — Encounter: Payer: Self-pay | Admitting: Internal Medicine

## 2015-05-25 ENCOUNTER — Encounter: Payer: Self-pay | Admitting: Internal Medicine

## 2015-05-25 ENCOUNTER — Non-Acute Institutional Stay: Payer: Medicare Other | Admitting: Internal Medicine

## 2015-05-25 VITALS — BP 104/58 | HR 68 | Temp 98.0°F | Wt 146.0 lb

## 2015-05-25 DIAGNOSIS — I10 Essential (primary) hypertension: Secondary | ICD-10-CM

## 2015-05-25 DIAGNOSIS — N183 Chronic kidney disease, stage 3 unspecified: Secondary | ICD-10-CM

## 2015-05-25 DIAGNOSIS — D509 Iron deficiency anemia, unspecified: Secondary | ICD-10-CM | POA: Diagnosis not present

## 2015-05-25 DIAGNOSIS — E039 Hypothyroidism, unspecified: Secondary | ICD-10-CM

## 2015-05-25 DIAGNOSIS — I5032 Chronic diastolic (congestive) heart failure: Secondary | ICD-10-CM | POA: Diagnosis not present

## 2015-05-25 DIAGNOSIS — I2510 Atherosclerotic heart disease of native coronary artery without angina pectoris: Secondary | ICD-10-CM | POA: Diagnosis not present

## 2015-05-25 DIAGNOSIS — R269 Unspecified abnormalities of gait and mobility: Secondary | ICD-10-CM

## 2015-05-25 DIAGNOSIS — I6523 Occlusion and stenosis of bilateral carotid arteries: Secondary | ICD-10-CM | POA: Diagnosis not present
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
## 2015-06-09 ENCOUNTER — Other Ambulatory Visit: Payer: Self-pay | Admitting: Internal Medicine

## 2015-07-12 ENCOUNTER — Other Ambulatory Visit: Payer: Self-pay | Admitting: Internal Medicine

## 2015-08-04 DIAGNOSIS — L821 Other seborrheic keratosis: Secondary | ICD-10-CM | POA: Diagnosis not present

## 2015-08-04 DIAGNOSIS — L723 Sebaceous cyst: Secondary | ICD-10-CM | POA: Diagnosis not present

## 2015-08-04 DIAGNOSIS — L818 Other specified disorders of pigmentation: Secondary | ICD-10-CM | POA: Diagnosis not present

## 2015-08-04 DIAGNOSIS — L565 Disseminated superficial actinic porokeratosis (DSAP): Secondary | ICD-10-CM | POA: Diagnosis not present

## 2015-08-04 DIAGNOSIS — L814 Other melanin hyperpigmentation: Secondary | ICD-10-CM | POA: Diagnosis not present

## 2015-08-04 DIAGNOSIS — D1801 Hemangioma of skin and subcutaneous tissue: Secondary | ICD-10-CM | POA: Diagnosis not present

## 2015-08-04 DIAGNOSIS — D485 Neoplasm of uncertain behavior of skin: Secondary | ICD-10-CM | POA: Diagnosis not present

## 2015-08-10 ENCOUNTER — Inpatient Hospital Stay (HOSPITAL_COMMUNITY): Admission: RE | Admit: 2015-08-10 | Payer: Medicare Other | Source: Ambulatory Visit

## 2015-08-10 ENCOUNTER — Encounter (HOSPITAL_COMMUNITY): Payer: Medicare Other

## 2015-08-10 ENCOUNTER — Other Ambulatory Visit: Payer: Self-pay | Admitting: Internal Medicine

## 2015-08-10 ENCOUNTER — Ambulatory Visit: Payer: Medicare Other | Admitting: Nurse Practitioner

## 2015-08-11 ENCOUNTER — Ambulatory Visit (HOSPITAL_COMMUNITY)
Admission: RE | Admit: 2015-08-11 | Discharge: 2015-08-11 | Disposition: A | Discharge status: Home / Self Care | Payer: Medicare Other | Source: Ambulatory Visit | Attending: Nurse Practitioner | Admitting: Nurse Practitioner

## 2015-08-11 DIAGNOSIS — I6523 Occlusion and stenosis of bilateral carotid arteries: Secondary | ICD-10-CM

## 2015-08-11 DIAGNOSIS — E785 Hyperlipidemia, unspecified: Secondary | ICD-10-CM | POA: Insufficient documentation

## 2015-08-11 DIAGNOSIS — I129 Hypertensive chronic kidney disease with stage 1 through stage 4 chronic kidney disease, or unspecified chronic kidney disease: Secondary | ICD-10-CM | POA: Insufficient documentation

## 2015-08-11 DIAGNOSIS — I2510 Atherosclerotic heart disease of native coronary artery without angina pectoris: Secondary | ICD-10-CM | POA: Insufficient documentation

## 2015-08-11 DIAGNOSIS — N183 Chronic kidney disease, stage 3 (moderate): Secondary | ICD-10-CM | POA: Diagnosis not present

## 2015-08-11 DIAGNOSIS — I10 Essential (primary) hypertension: Secondary | ICD-10-CM | POA: Insufficient documentation

## 2015-08-16 ENCOUNTER — Telehealth: Payer: Self-pay | Admitting: *Deleted

## 2015-08-16 NOTE — Telephone Encounter (Signed)
Agree with compression stockings 70mmHg.  #3 pair.  Dx:  Chronic venous insufficiency.  Please print out and stamp and fax script.

## 2015-08-16 NOTE — Telephone Encounter (Signed)
Patient called and Left message and stated that she needs a Rx for Compression Hose.  Tried calling patient back and LMOM to return call.

## 2015-08-16 NOTE — Telephone Encounter (Signed)
Bought Compression Hose from [facility] and since compression is above 30 she needs a Rx. And needs it to  Fax #:  [phone]. Please Advise.

## 2015-08-17 ENCOUNTER — Encounter: Payer: Self-pay | Admitting: *Deleted

## 2015-08-17 MED ORDER — AMBULATORY NON FORMULARY MEDICATION: Status: DC

## 2015-08-17 NOTE — Telephone Encounter (Signed)
Rx printed and faxed to [place]: [facility] [place] Fax: [phone]

## 2015-08-24 ENCOUNTER — Ambulatory Visit (INDEPENDENT_AMBULATORY_CARE_PROVIDER_SITE_OTHER): Payer: Medicare Other | Admitting: Internal Medicine

## 2015-08-24 ENCOUNTER — Encounter: Payer: Self-pay | Admitting: Internal Medicine

## 2015-08-24 VITALS — BP 130/82 | HR 68 | Temp 98.2°F | Resp 20 | Ht 65.0 in | Wt 146.6 lb

## 2015-08-24 DIAGNOSIS — I6523 Occlusion and stenosis of bilateral carotid arteries: Secondary | ICD-10-CM | POA: Diagnosis not present

## 2015-08-24 DIAGNOSIS — J209 Acute bronchitis, unspecified: Secondary | ICD-10-CM | POA: Diagnosis not present

## 2015-08-24 DIAGNOSIS — J40 Bronchitis, not specified as acute or chronic: Secondary | ICD-10-CM | POA: Insufficient documentation

## 2015-08-24 MED ORDER — AMOXICILLIN 500 MG PO TABS: ORAL_TABLET | ORAL | Status: DC

## 2015-08-24 NOTE — Progress Notes (Signed)
Patient ID: Debra Brooks, female   DOB: July 16, 1925, 81 y.o.   MRN: [id number]    Facility  [facility]    Place of Service:   OFFICE    Allergies  Allergen Reactions  . Iodinated Diagnostic Agents     Unknown allergy' but it was documented that she did fine and had no problems with the 13 hour prep for CT consisting of prednisone and benadryl before imaging.  Today on 11/06/14, she did the 1 hour emergent prep of prednisone and benadryl 1 hour before testing and after imaging, she had no problems with the IV dye  . Levaquin [Levofloxacin] Nausea Only  . Pacerone [Amiodarone]     unknown  . Valium [Diazepam]     unknown    Chief Complaint  Patient presents with  . Acute Visit    Patient c/o has cough, wheezing, sore throat    HPI:  Acute bronchitis, unspecified organism - started with sore throat, cough, and congestion 6 days ago. Has not run any fever. Producing some clear sputum. Feels that her symptoms are getting worse day by day. Denies wheezing    Medications: Patient's Medications  New Prescriptions   No medications on file  Previous Medications   ALPRAZOLAM (XANAX) 0.25 MG TABLET    Take one tablet by mouth at bedtime as needed for rest and an additional one daily for anxiety.   AMBULATORY NON FORMULARY MEDICATION    Compression Stocking 65mHg #3(three) Pair Dx: Chronic Venous Insufficiency I87.2   ASCORBIC ACID (VITAMIN C) 500 MG CHEW    Take 1 tablet by mouth daily ( chewable )   ASPIRIN 81 MG TABLET    Take 81 mg by mouth daily.     ATORVASTATIN (LIPITOR) 10 MG TABLET    TAKE 1 TABLET ONCE DAILY FOR CHOLESTEROL.   BUPROPION (WELLBUTRIN XL) 150 MG 24 HR TABLET    TAKE (1) TABLET DAILY IN THE MORNING.   CARVEDILOL (COREG) 3.125 MG TABLET    TAKE 1 TABLET TWICE DAILY WITH A MEAL.   CHOLECALCIFEROL (VITAMIN D3) 1000 UNITS CAPS    Take 1 tablet by mouth once daily   [place]OX 125 MCG TABLET    TAKE 1 TABLET DAILY FOR HEART.   FUROSEMIDE (LASIX) 40 MG TABLET    Take 40 mg by  mouth. Take one tablet daily, take 2nd tablet as needed for swelling   IRON POLYSACCHARIDES (NIFEREX) 150 MG CAPSULE    Take 150 mg by mouth. Take one tablet daily   LYRICA 50 MG CAPSULE    TAKE 1 CAPSULE ONCE DAILY TO REDUCE NEUROPATHIC PAIN.   MULTIPLE VITAMINS-MINERALS (ICAPS PO)    Take 1 capsule by mouth. Daily   NITROGLYCERIN (NITROSTAT) 0.4 MG SL TABLET    Place 1 tablet (0.4 mg total) under the tongue every 5 (five) minutes as needed for chest pain.   PANTOPRAZOLE (PROTONIX) 40 MG TABLET    Take 40 mg by mouth daily.   POLYETHYLENE GLYCOL POWDER (GLYCOLAX/MIRALAX) POWDER    Take 17 g by mouth daily as needed. For constipation. Mix with 6oz. Beverage of choice   POTASSIUM CHLORIDE SA (K-DUR,KLOR-CON) 20 MEQ TABLET    TAKE 1 TABLET DAILY. AND TAKE AS NEEDED IF SECOND FUROSEMIDE TABLET IS NEEDED.   SYNTHROID 75 MCG TABLET    TAKE 1 TABLET ONCE DAILY.   URSODIOL (ACTIGALL) 300 MG CAPSULE    Take 300 mg by mouth 2 (two) times daily.  Modified Medications   No  medications on file  Discontinued Medications   No medications on file    Review of Systems  Constitutional: Negative for fever and chills.  HENT: Positive for postnasal drip, sinus pressure and sore throat. Negative for congestion and hearing loss.        Had cerumen impaction  Eyes:       Has dry macular degeneration  Respiratory: Positive for cough and chest tightness. Negative for shortness of breath.   Cardiovascular: Positive for leg swelling. Negative for chest pain.       Chronic edema; worse when she's been up more, wearing compression hose  Gastrointestinal: Negative for abdominal pain, constipation and blood in stool.  Genitourinary: Negative for dysuria, urgency and frequency.  Skin: Negative for rash.  Neurological: Negative for dizziness and weakness.  Psychiatric/Behavioral: The patient is nervous/anxious.        Improving    Filed Vitals:   08/24/15 1239  BP: 130/82  Pulse: 68  Temp: 98.2 F (36.8 C)    TempSrc: Oral  Resp: 20  Height: _0  (1.651 m)  Weight: 146 lb 9.6 oz (66.497 kg)  SpO2: 95%   Body mass index is 24.4 kg/(m^2).  Physical Exam  Constitutional: She is oriented to person, place, and time. She appears well-developed and well-nourished. No distress.  HENT:  Head: Normocephalic and atraumatic.  Right Ear: External ear normal.  Left Ear: External ear normal.  Nose: Nose normal.  Mouth/Throat: Oropharynx is clear and moist. No oropharyngeal exudate.  No cerumen  Eyes: Conjunctivae and EOM are normal. Pupils are equal, round, and reactive to light. No scleral icterus.  Neck: No JVD present. No tracheal deviation present. No thyromegaly present.  Cardiovascular: Normal rate, regular rhythm and intact distal pulses.  Exam reveals no gallop and no friction rub.   Murmur heard. Pulmonary/Chest: Effort normal and breath sounds normal. No respiratory distress. She has no wheezes. She has no rales. She exhibits no tenderness.  Abdominal: Soft. Bowel sounds are normal. She exhibits no distension and no mass. There is no tenderness.  Musculoskeletal: Normal range of motion. She exhibits no edema or tenderness.  Walks with her rollator walker  Lymphadenopathy:    She has no cervical adenopathy.  Neurological: She is alert and oriented to person, place, and time. No cranial nerve deficit. Coordination normal.  Skin: Skin is warm and dry. No rash noted. She is not diaphoretic. No erythema. No pallor.  sebaceous cyst on right side just above waistline without erythema, warmth, drainage or tenderness  Psychiatric: She has a normal mood and affect. Her behavior is normal. Judgment and thought content normal.    Labs reviewed: Lab Summary Latest Ref Rng 05/17/2015 03/03/2015 01/18/2015 12/02/2014  Hemoglobin 12.0 - 16.0 g/dL 12.3 11.2(A) 10.0(A) 9.2(A)  Hematocrit 36 - 46 % 37 36 32(A) 29(A)  White count - 7.4 6.1 6.4 5.6  Platelet count 150 - 399 K/L 230 236 274 289  Sodium 137 -  147 mmol/L 141 (None) 142 (None)  Potassium 3.4 - 5.3 mmol/L 4.0 (None) 4.2 (None)  Calcium - (None) (None) (None) (None)  Phosphorus - (None) (None) (None) (None)  Creatinine 0.5 - 1.1 mg/dL 1.2(A) (None) 1.2(A) (None)  AST 13 - 35 U/L (None) (None) 17 (None)  Alk Phos 25 - 125 U/L (None) (None) 58 (None)  Bilirubin - (None) (None) (None) (None)  Glucose - 91 (None) 86 (None)  Cholesterol 0 - 200 mg/dL (None) (None) 132 (None)  HDL cholesterol 35 - 70 mg/dL (  None) (None) 59 (None)  Triglycerides 40 - 160 mg/dL (None) (None) 127 (None)  LDL Direct - (None) (None) (None) (None)  LDL Calc - (None) (None) 63 (None)  Total protein - (None) (None) (None) (None)  Albumin - (None) (None) (None) (None)   Lab Results  Component Value Date   TSH 1.81 01/18/2015   Lab Results  Component Value Date   BUN 29* 05/17/2015   No results found for: HGBA1C  Assessment/Plan  1. Acute bronchitis, unspecified organism -Amoxicillin 500 mg (14 tabs) one twice daily for infection -Robitussin-DM 10 mL every 4 hours when necessary cough and congestion

## 2015-08-31 ENCOUNTER — Telehealth: Payer: Self-pay

## 2015-08-31 ENCOUNTER — Encounter: Payer: Self-pay | Admitting: Nurse Practitioner

## 2015-08-31 ENCOUNTER — Ambulatory Visit (INDEPENDENT_AMBULATORY_CARE_PROVIDER_SITE_OTHER): Payer: Medicare Other | Admitting: Nurse Practitioner

## 2015-08-31 VITALS — BP 120/64 | HR 74 | Ht 65.0 in | Wt 148.8 lb

## 2015-08-31 DIAGNOSIS — I2510 Atherosclerotic heart disease of native coronary artery without angina pectoris: Secondary | ICD-10-CM

## 2015-08-31 DIAGNOSIS — I4892 Unspecified atrial flutter: Secondary | ICD-10-CM

## 2015-08-31 DIAGNOSIS — I5032 Chronic diastolic (congestive) heart failure: Secondary | ICD-10-CM | POA: Diagnosis not present

## 2015-08-31 DIAGNOSIS — I6523 Occlusion and stenosis of bilateral carotid arteries: Secondary | ICD-10-CM

## 2015-08-31 NOTE — Patient Instructions (Addendum)
We will be checking the following labs today - NONE   Medication Instructions:    Continue with your current medicines.     Testing/Procedures To Be Arranged:  N/A  Follow-Up:   See me in 4 months    Other Special Instructions:   N/A    If you need a refill on your cardiac medications before your next appointment, please call your pharmacy.   Call the [facility] office at [phone] if you have any questions, problems or concerns.      
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
## 2015-08-31 NOTE — Telephone Encounter (Signed)
Jeanie Sewer clinic nurse got a call from patient, she hasn't received her compression stockings. In the chart, it was done 08/17/15. Called patient and told her the office faxed 08/17/15 attention Cathy. She will call [facility] gave her the fax number it was faxed to, if they haven't received it have them call the office with the correct fax number.

## 2015-09-06 DIAGNOSIS — R140 Abdominal distension (gaseous): Secondary | ICD-10-CM | POA: Diagnosis not present

## 2015-09-06 DIAGNOSIS — Z862 Personal history of diseases of the blood and blood-forming organs and certain disorders involving the immune mechanism: Secondary | ICD-10-CM | POA: Diagnosis not present

## 2015-09-12 ENCOUNTER — Other Ambulatory Visit: Payer: Self-pay | Admitting: Internal Medicine

## 2015-09-13 DIAGNOSIS — E785 Hyperlipidemia, unspecified: Secondary | ICD-10-CM | POA: Diagnosis not present

## 2015-09-13 DIAGNOSIS — D509 Iron deficiency anemia, unspecified: Secondary | ICD-10-CM | POA: Diagnosis not present

## 2015-09-13 LAB — CBC AND DIFFERENTIAL
HEMATOCRIT: 41 % (ref 36–46)
HEMOGLOBIN: 13.7 g/dL (ref 12.0–16.0)
PLATELETS: 218 10*3/uL (ref 150–399)
WBC: 6.9 10*3/mL

## 2015-09-13 LAB — LIPID PANEL
Cholesterol: 148 mg/dL (ref 0–200)
HDL: 61 mg/dL (ref 35–70)
LDL Cholesterol: 81 mg/dL
LDL/HDL RATIO: 1.3
Triglycerides: 122 mg/dL (ref 40–160)

## 2015-09-21 ENCOUNTER — Non-Acute Institutional Stay: Payer: Medicare Other | Admitting: Internal Medicine

## 2015-09-21 ENCOUNTER — Encounter: Payer: Self-pay | Admitting: Internal Medicine

## 2015-09-21 VITALS — BP 100/60 | HR 68 | Temp 97.5°F | Wt 147.0 lb

## 2015-09-21 DIAGNOSIS — I5032 Chronic diastolic (congestive) heart failure: Secondary | ICD-10-CM | POA: Diagnosis not present

## 2015-09-21 DIAGNOSIS — I6523 Occlusion and stenosis of bilateral carotid arteries: Secondary | ICD-10-CM | POA: Diagnosis not present

## 2015-09-21 DIAGNOSIS — D509 Iron deficiency anemia, unspecified: Secondary | ICD-10-CM

## 2015-09-21 DIAGNOSIS — N183 Chronic kidney disease, stage 3 unspecified: Secondary | ICD-10-CM

## 2015-09-21 DIAGNOSIS — I10 Essential (primary) hypertension: Secondary | ICD-10-CM | POA: Diagnosis not present
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
## 2015-10-05 ENCOUNTER — Ambulatory Visit: Payer: Self-pay

## 2015-10-06 ENCOUNTER — Other Ambulatory Visit: Payer: Self-pay | Admitting: Internal Medicine

## 2015-10-06 ENCOUNTER — Other Ambulatory Visit: Payer: Self-pay | Admitting: Nurse Practitioner

## 2015-10-06 ENCOUNTER — Other Ambulatory Visit: Payer: Self-pay | Admitting: Cardiology

## 2015-10-19 ENCOUNTER — Encounter: Payer: Medicare Other | Admitting: Internal Medicine

## 2015-10-19 ENCOUNTER — Emergency Department (HOSPITAL_COMMUNITY): Payer: Medicare Other

## 2015-10-19 ENCOUNTER — Emergency Department (HOSPITAL_COMMUNITY)
Admission: EM | Admit: 2015-10-19 | Discharge: 2015-10-19 | Disposition: A | Discharge status: Home / Self Care | Payer: Medicare Other | Attending: Emergency Medicine | Admitting: Emergency Medicine

## 2015-10-19 ENCOUNTER — Encounter (HOSPITAL_COMMUNITY): Payer: Self-pay

## 2015-10-19 DIAGNOSIS — R404 Transient alteration of awareness: Secondary | ICD-10-CM | POA: Diagnosis not present

## 2015-10-19 DIAGNOSIS — M1711 Unilateral primary osteoarthritis, right knee: Secondary | ICD-10-CM | POA: Diagnosis not present

## 2015-10-19 DIAGNOSIS — Z8619 Personal history of other infectious and parasitic diseases: Secondary | ICD-10-CM | POA: Diagnosis not present

## 2015-10-19 DIAGNOSIS — Z7982 Long term (current) use of aspirin: Secondary | ICD-10-CM | POA: Diagnosis not present

## 2015-10-19 DIAGNOSIS — H8303 Labyrinthitis, bilateral: Secondary | ICD-10-CM | POA: Insufficient documentation

## 2015-10-19 DIAGNOSIS — I4892 Unspecified atrial flutter: Secondary | ICD-10-CM | POA: Insufficient documentation

## 2015-10-19 DIAGNOSIS — M810 Age-related osteoporosis without current pathological fracture: Secondary | ICD-10-CM | POA: Diagnosis not present

## 2015-10-19 DIAGNOSIS — E876 Hypokalemia: Secondary | ICD-10-CM | POA: Insufficient documentation

## 2015-10-19 DIAGNOSIS — E039 Hypothyroidism, unspecified: Secondary | ICD-10-CM | POA: Diagnosis not present

## 2015-10-19 DIAGNOSIS — Z79899 Other long term (current) drug therapy: Secondary | ICD-10-CM | POA: Diagnosis not present

## 2015-10-19 DIAGNOSIS — F329 Major depressive disorder, single episode, unspecified: Secondary | ICD-10-CM | POA: Diagnosis not present

## 2015-10-19 DIAGNOSIS — E785 Hyperlipidemia, unspecified: Secondary | ICD-10-CM | POA: Insufficient documentation

## 2015-10-19 DIAGNOSIS — Z85038 Personal history of other malignant neoplasm of large intestine: Secondary | ICD-10-CM | POA: Insufficient documentation

## 2015-10-19 DIAGNOSIS — I509 Heart failure, unspecified: Secondary | ICD-10-CM | POA: Diagnosis not present

## 2015-10-19 DIAGNOSIS — Z87891 Personal history of nicotine dependence: Secondary | ICD-10-CM | POA: Diagnosis not present

## 2015-10-19 DIAGNOSIS — F419 Anxiety disorder, unspecified: Secondary | ICD-10-CM | POA: Insufficient documentation

## 2015-10-19 DIAGNOSIS — I129 Hypertensive chronic kidney disease with stage 1 through stage 4 chronic kidney disease, or unspecified chronic kidney disease: Secondary | ICD-10-CM | POA: Diagnosis not present

## 2015-10-19 DIAGNOSIS — I25119 Atherosclerotic heart disease of native coronary artery with unspecified angina pectoris: Secondary | ICD-10-CM | POA: Diagnosis not present

## 2015-10-19 DIAGNOSIS — H938X9 Other specified disorders of ear, unspecified ear: Secondary | ICD-10-CM | POA: Insufficient documentation

## 2015-10-19 DIAGNOSIS — Z8701 Personal history of pneumonia (recurrent): Secondary | ICD-10-CM | POA: Diagnosis not present

## 2015-10-19 DIAGNOSIS — Z8719 Personal history of other diseases of the digestive system: Secondary | ICD-10-CM | POA: Insufficient documentation

## 2015-10-19 DIAGNOSIS — N183 Chronic kidney disease, stage 3 (moderate): Secondary | ICD-10-CM | POA: Diagnosis not present

## 2015-10-19 DIAGNOSIS — Z86018 Personal history of other benign neoplasm: Secondary | ICD-10-CM | POA: Insufficient documentation

## 2015-10-19 DIAGNOSIS — Z853 Personal history of malignant neoplasm of breast: Secondary | ICD-10-CM | POA: Insufficient documentation

## 2015-10-19 DIAGNOSIS — Z8709 Personal history of other diseases of the respiratory system: Secondary | ICD-10-CM | POA: Diagnosis not present

## 2015-10-19 DIAGNOSIS — R42 Dizziness and giddiness: Secondary | ICD-10-CM | POA: Insufficient documentation

## 2015-10-19 DIAGNOSIS — R531 Weakness: Secondary | ICD-10-CM | POA: Diagnosis not present

## 2015-10-19 DIAGNOSIS — Z8781 Personal history of (healed) traumatic fracture: Secondary | ICD-10-CM | POA: Insufficient documentation

## 2015-10-19 DIAGNOSIS — D649 Anemia, unspecified: Secondary | ICD-10-CM | POA: Diagnosis not present

## 2015-10-19 DIAGNOSIS — I361 Nonrheumatic tricuspid (valve) insufficiency: Secondary | ICD-10-CM | POA: Insufficient documentation

## 2015-10-19 DIAGNOSIS — H9190 Unspecified hearing loss, unspecified ear: Secondary | ICD-10-CM | POA: Diagnosis not present

## 2015-10-19 LAB — CBC
HEMATOCRIT: 43 % (ref 36.0–46.0)
HEMOGLOBIN: 14.1 g/dL (ref 12.0–15.0)
MCH: 31.8 pg (ref 26.0–34.0)
MCHC: 32.8 g/dL (ref 30.0–36.0)
MCV: 97.1 fL (ref 78.0–100.0)
Platelets: 225 10*3/uL (ref 150–400)
RBC: 4.43 MIL/uL (ref 3.87–5.11)
RDW: 13.5 % (ref 11.5–15.5)
WBC: 7.8 10*3/uL (ref 4.0–10.5)

## 2015-10-19 LAB — URINALYSIS, ROUTINE W REFLEX MICROSCOPIC
BILIRUBIN URINE: NEGATIVE
GLUCOSE, UA: NEGATIVE mg/dL
HGB URINE DIPSTICK: NEGATIVE
Ketones, ur: NEGATIVE mg/dL
Leukocytes, UA: NEGATIVE
Nitrite: NEGATIVE
PROTEIN: NEGATIVE mg/dL
SPECIFIC GRAVITY, URINE: 1.007 (ref 1.005–1.030)
pH: 7 (ref 5.0–8.0)

## 2015-10-19 LAB — I-STAT TROPONIN, ED: Troponin i, poc: 0.03 ng/mL (ref 0.00–0.08)

## 2015-10-19 LAB — COMPREHENSIVE METABOLIC PANEL
ALK PHOS: 69 U/L (ref 38–126)
ALT: 18 U/L (ref 14–54)
ANION GAP: 10 (ref 5–15)
AST: 21 U/L (ref 15–41)
Albumin: 4 g/dL (ref 3.5–5.0)
BILIRUBIN TOTAL: 0.9 mg/dL (ref 0.3–1.2)
BUN: 21 mg/dL — ABNORMAL HIGH (ref 6–20)
CALCIUM: 9.7 mg/dL (ref 8.9–10.3)
CO2: 31 mmol/L (ref 22–32)
CREATININE: 1.04 mg/dL — AB (ref 0.44–1.00)
Chloride: 103 mmol/L (ref 101–111)
GFR calc non Af Amer: 47 mL/min — ABNORMAL LOW (ref >60)
GFR, EST AFRICAN AMERICAN: 54 mL/min — AB (ref >60)
GLUCOSE: 149 mg/dL — AB (ref 65–99)
Potassium: 3.5 mmol/L (ref 3.5–5.1)
Sodium: 144 mmol/L (ref 135–145)
TOTAL PROTEIN: 6.5 g/dL (ref 6.5–8.1)

## 2015-10-19 LAB — ETHANOL: Alcohol, Ethyl (B): <5 mg/dL (ref <5)

## 2015-10-19 LAB — APTT: aPTT: 28 seconds (ref 24–37)

## 2015-10-19 LAB — PROTIME-INR
INR: 1.04 (ref 0.00–1.49)
Prothrombin Time: 13.8 seconds (ref 11.6–15.2)

## 2015-10-19 LAB — DIFFERENTIAL
Basophils Absolute: 0.1 10*3/uL (ref 0.0–0.1)
Basophils Relative: 1 %
EOS PCT: 1 %
Eosinophils Absolute: 0.1 10*3/uL (ref 0.0–0.7)
LYMPHS ABS: 0.8 10*3/uL (ref 0.7–4.0)
LYMPHS PCT: 11 %
MONO ABS: 0.2 10*3/uL (ref 0.1–1.0)
Monocytes Relative: 3 %
NEUTROS ABS: 6.6 10*3/uL (ref 1.7–7.7)
Neutrophils Relative %: 84 %

## 2015-10-19 NOTE — Discharge Instructions (Signed)

## 2015-10-19 NOTE — ED Notes (Signed)
Labs have been drawn and she is on her way to [facility]
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
## 2015-10-19 NOTE — ED Notes (Signed)
Bed: [id number] Expected date:  Expected time:  Means of arrival:  Comments: [id number]-[id number] diarrhea

## 2015-10-19 NOTE — ED Notes (Signed)
She states that at ~1000 hours today she noted her hearing to dissipate and she was subsequently "dizzy" which caused her to miss a flight for [place].  She is alert and oriented x 4.  She denies pain.

## 2015-10-19 NOTE — ED Notes (Signed)
Patient is from [facility] sent out today with acute onset of hearing loss and dizziness. Patient complains of pressure in her left ear. Patient is alert and oriented. Sent out for evaluation.

## 2015-10-20 DIAGNOSIS — H8303 Labyrinthitis, bilateral: Secondary | ICD-10-CM | POA: Diagnosis not present

## 2015-10-26 DIAGNOSIS — R42 Dizziness and giddiness: Secondary | ICD-10-CM | POA: Diagnosis not present

## 2015-10-26 DIAGNOSIS — R2681 Unsteadiness on feet: Secondary | ICD-10-CM | POA: Diagnosis not present

## 2015-10-26 DIAGNOSIS — R278 Other lack of coordination: Secondary | ICD-10-CM | POA: Diagnosis not present

## 2015-10-26 DIAGNOSIS — R2689 Other abnormalities of gait and mobility: Secondary | ICD-10-CM | POA: Diagnosis not present

## 2015-10-26 DIAGNOSIS — F418 Other specified anxiety disorders: Secondary | ICD-10-CM | POA: Diagnosis not present

## 2015-10-27 ENCOUNTER — Non-Acute Institutional Stay (SKILLED_NURSING_FACILITY): Payer: Medicare Other | Admitting: Adult Health

## 2015-10-27 DIAGNOSIS — H8303 Labyrinthitis, bilateral: Secondary | ICD-10-CM

## 2015-10-27 DIAGNOSIS — D509 Iron deficiency anemia, unspecified: Secondary | ICD-10-CM | POA: Diagnosis not present

## 2015-10-27 DIAGNOSIS — R269 Unspecified abnormalities of gait and mobility: Secondary | ICD-10-CM

## 2015-10-27 DIAGNOSIS — R278 Other lack of coordination: Secondary | ICD-10-CM | POA: Diagnosis not present

## 2015-10-27 DIAGNOSIS — I10 Essential (primary) hypertension: Secondary | ICD-10-CM

## 2015-10-27 DIAGNOSIS — H903 Sensorineural hearing loss, bilateral: Secondary | ICD-10-CM | POA: Diagnosis not present

## 2015-10-27 DIAGNOSIS — I5032 Chronic diastolic (congestive) heart failure: Secondary | ICD-10-CM

## 2015-10-27 DIAGNOSIS — N183 Chronic kidney disease, stage 3 unspecified: Secondary | ICD-10-CM

## 2015-10-27 DIAGNOSIS — I2510 Atherosclerotic heart disease of native coronary artery without angina pectoris: Secondary | ICD-10-CM

## 2015-10-27 DIAGNOSIS — G609 Hereditary and idiopathic neuropathy, unspecified: Secondary | ICD-10-CM

## 2015-10-27 DIAGNOSIS — R2681 Unsteadiness on feet: Secondary | ICD-10-CM | POA: Diagnosis not present

## 2015-10-27 DIAGNOSIS — F418 Other specified anxiety disorders: Secondary | ICD-10-CM | POA: Diagnosis not present

## 2015-10-27 DIAGNOSIS — R42 Dizziness and giddiness: Secondary | ICD-10-CM | POA: Diagnosis not present

## 2015-10-27 DIAGNOSIS — R2689 Other abnormalities of gait and mobility: Secondary | ICD-10-CM | POA: Diagnosis not present

## 2015-10-28 ENCOUNTER — Encounter: Payer: Self-pay | Admitting: Adult Health

## 2015-10-28 ENCOUNTER — Telehealth: Payer: Self-pay | Admitting: Nurse Practitioner

## 2015-10-28 DIAGNOSIS — R278 Other lack of coordination: Secondary | ICD-10-CM | POA: Diagnosis not present

## 2015-10-28 DIAGNOSIS — R42 Dizziness and giddiness: Secondary | ICD-10-CM | POA: Diagnosis not present

## 2015-10-28 DIAGNOSIS — F418 Other specified anxiety disorders: Secondary | ICD-10-CM | POA: Diagnosis not present

## 2015-10-28 DIAGNOSIS — R2681 Unsteadiness on feet: Secondary | ICD-10-CM | POA: Diagnosis not present

## 2015-10-28 DIAGNOSIS — G609 Hereditary and idiopathic neuropathy, unspecified: Secondary | ICD-10-CM | POA: Insufficient documentation

## 2015-10-28 DIAGNOSIS — R2689 Other abnormalities of gait and mobility: Secondary | ICD-10-CM | POA: Diagnosis not present

## 2015-10-28 NOTE — Telephone Encounter (Signed)
Calling wanting to know if it is OK to take Prednisone with the heart medicines that she is taking.  States Dr. Erik Obey wants to give her Prednisone for the loss of hearing in her (L) ear. See in Epic she was in ER 12/21 for dizziness, lightheaded, and trouble walking.  She missed her plane flight to visit family for holidays.  States she is still having problems and is in a rehab center until her balance is better. Advised she should be able to take Prednisone with her heart medicines. States she will make Dr. Erik Obey aware.
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
## 2015-10-28 NOTE — Telephone Encounter (Signed)
New message     Patient calling another MD wants started her on presidone would be okay with her heart medicine.

## 2015-10-31 ENCOUNTER — Encounter: Payer: Self-pay | Admitting: Internal Medicine

## 2015-10-31 ENCOUNTER — Non-Acute Institutional Stay (SKILLED_NURSING_FACILITY): Payer: Medicare Other | Admitting: Internal Medicine

## 2015-10-31 DIAGNOSIS — I2510 Atherosclerotic heart disease of native coronary artery without angina pectoris: Secondary | ICD-10-CM | POA: Diagnosis not present

## 2015-10-31 DIAGNOSIS — F325 Major depressive disorder, single episode, in full remission: Secondary | ICD-10-CM

## 2015-10-31 DIAGNOSIS — I10 Essential (primary) hypertension: Secondary | ICD-10-CM | POA: Diagnosis not present

## 2015-10-31 DIAGNOSIS — I679 Cerebrovascular disease, unspecified: Secondary | ICD-10-CM | POA: Diagnosis not present

## 2015-10-31 DIAGNOSIS — E039 Hypothyroidism, unspecified: Secondary | ICD-10-CM

## 2015-10-31 DIAGNOSIS — D509 Iron deficiency anemia, unspecified: Secondary | ICD-10-CM

## 2015-10-31 DIAGNOSIS — R269 Unspecified abnormalities of gait and mobility: Secondary | ICD-10-CM | POA: Diagnosis not present

## 2015-10-31 DIAGNOSIS — R2681 Unsteadiness on feet: Secondary | ICD-10-CM | POA: Diagnosis not present

## 2015-10-31 DIAGNOSIS — I4892 Unspecified atrial flutter: Secondary | ICD-10-CM

## 2015-10-31 DIAGNOSIS — G609 Hereditary and idiopathic neuropathy, unspecified: Secondary | ICD-10-CM

## 2015-10-31 DIAGNOSIS — I5032 Chronic diastolic (congestive) heart failure: Secondary | ICD-10-CM

## 2015-10-31 DIAGNOSIS — N183 Chronic kidney disease, stage 3 unspecified: Secondary | ICD-10-CM

## 2015-10-31 DIAGNOSIS — F418 Other specified anxiety disorders: Secondary | ICD-10-CM | POA: Diagnosis not present

## 2015-10-31 DIAGNOSIS — H8303 Labyrinthitis, bilateral: Secondary | ICD-10-CM | POA: Diagnosis not present

## 2015-10-31 DIAGNOSIS — R278 Other lack of coordination: Secondary | ICD-10-CM | POA: Diagnosis not present

## 2015-10-31 DIAGNOSIS — R2689 Other abnormalities of gait and mobility: Secondary | ICD-10-CM | POA: Diagnosis not present

## 2015-10-31 DIAGNOSIS — R42 Dizziness and giddiness: Secondary | ICD-10-CM | POA: Diagnosis not present

## 2015-10-31 MED ORDER — PREDNISONE 10 MG PO TABS: ORAL_TABLET | ORAL | Status: DC
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
## 2015-11-01 DIAGNOSIS — R42 Dizziness and giddiness: Secondary | ICD-10-CM | POA: Diagnosis not present

## 2015-11-01 DIAGNOSIS — R2689 Other abnormalities of gait and mobility: Secondary | ICD-10-CM | POA: Diagnosis not present

## 2015-11-01 DIAGNOSIS — F418 Other specified anxiety disorders: Secondary | ICD-10-CM | POA: Diagnosis not present

## 2015-11-01 DIAGNOSIS — R2681 Unsteadiness on feet: Secondary | ICD-10-CM | POA: Diagnosis not present

## 2015-11-01 DIAGNOSIS — R278 Other lack of coordination: Secondary | ICD-10-CM | POA: Diagnosis not present

## 2015-11-02 DIAGNOSIS — F418 Other specified anxiety disorders: Secondary | ICD-10-CM | POA: Diagnosis not present

## 2015-11-02 DIAGNOSIS — R278 Other lack of coordination: Secondary | ICD-10-CM | POA: Diagnosis not present

## 2015-11-02 DIAGNOSIS — R42 Dizziness and giddiness: Secondary | ICD-10-CM | POA: Diagnosis not present

## 2015-11-02 DIAGNOSIS — R2681 Unsteadiness on feet: Secondary | ICD-10-CM | POA: Diagnosis not present

## 2015-11-02 DIAGNOSIS — R2689 Other abnormalities of gait and mobility: Secondary | ICD-10-CM | POA: Diagnosis not present

## 2015-11-03 ENCOUNTER — Non-Acute Institutional Stay (SKILLED_NURSING_FACILITY): Payer: Medicare Other | Admitting: Adult Health

## 2015-11-03 ENCOUNTER — Encounter: Payer: Self-pay | Admitting: Adult Health

## 2015-11-03 DIAGNOSIS — R269 Unspecified abnormalities of gait and mobility: Secondary | ICD-10-CM

## 2015-11-03 DIAGNOSIS — G4700 Insomnia, unspecified: Secondary | ICD-10-CM

## 2015-11-03 DIAGNOSIS — H8303 Labyrinthitis, bilateral: Secondary | ICD-10-CM | POA: Diagnosis not present

## 2015-11-03 DIAGNOSIS — N183 Chronic kidney disease, stage 3 unspecified: Secondary | ICD-10-CM

## 2015-11-03 DIAGNOSIS — I2510 Atherosclerotic heart disease of native coronary artery without angina pectoris: Secondary | ICD-10-CM

## 2015-11-03 DIAGNOSIS — I5032 Chronic diastolic (congestive) heart failure: Secondary | ICD-10-CM

## 2015-11-03 DIAGNOSIS — R2689 Other abnormalities of gait and mobility: Secondary | ICD-10-CM | POA: Diagnosis not present

## 2015-11-03 DIAGNOSIS — R278 Other lack of coordination: Secondary | ICD-10-CM | POA: Diagnosis not present

## 2015-11-03 DIAGNOSIS — D509 Iron deficiency anemia, unspecified: Secondary | ICD-10-CM

## 2015-11-03 DIAGNOSIS — G609 Hereditary and idiopathic neuropathy, unspecified: Secondary | ICD-10-CM | POA: Diagnosis not present

## 2015-11-03 DIAGNOSIS — F418 Other specified anxiety disorders: Secondary | ICD-10-CM | POA: Diagnosis not present

## 2015-11-03 DIAGNOSIS — I10 Essential (primary) hypertension: Secondary | ICD-10-CM

## 2015-11-03 DIAGNOSIS — R42 Dizziness and giddiness: Secondary | ICD-10-CM | POA: Diagnosis not present

## 2015-11-03 DIAGNOSIS — R2681 Unsteadiness on feet: Secondary | ICD-10-CM | POA: Diagnosis not present
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
## 2015-11-07 DIAGNOSIS — R2689 Other abnormalities of gait and mobility: Secondary | ICD-10-CM | POA: Diagnosis not present

## 2015-11-07 DIAGNOSIS — R42 Dizziness and giddiness: Secondary | ICD-10-CM | POA: Diagnosis not present

## 2015-11-07 DIAGNOSIS — R278 Other lack of coordination: Secondary | ICD-10-CM | POA: Diagnosis not present

## 2015-11-07 DIAGNOSIS — R2681 Unsteadiness on feet: Secondary | ICD-10-CM | POA: Diagnosis not present

## 2015-11-07 DIAGNOSIS — F418 Other specified anxiety disorders: Secondary | ICD-10-CM | POA: Diagnosis not present

## 2015-11-08 DIAGNOSIS — H43813 Vitreous degeneration, bilateral: Secondary | ICD-10-CM

## 2015-11-08 DIAGNOSIS — H353190 Nonexudative age-related macular degeneration, unspecified eye, stage unspecified: Secondary | ICD-10-CM | POA: Diagnosis not present

## 2015-11-08 DIAGNOSIS — H35372 Puckering of macula, left eye: Secondary | ICD-10-CM | POA: Diagnosis not present

## 2015-11-08 DIAGNOSIS — H35379 Puckering of macula, unspecified eye: Secondary | ICD-10-CM | POA: Insufficient documentation

## 2015-11-08 DIAGNOSIS — Z961 Presence of intraocular lens: Secondary | ICD-10-CM | POA: Diagnosis not present

## 2015-11-08 DIAGNOSIS — H18423 Band keratopathy, bilateral: Secondary | ICD-10-CM | POA: Diagnosis not present

## 2015-11-08 DIAGNOSIS — H43819 Vitreous degeneration, unspecified eye: Secondary | ICD-10-CM | POA: Insufficient documentation

## 2015-11-08 HISTORY — DX: Vitreous degeneration, bilateral: H43.813

## 2015-11-09 DIAGNOSIS — F418 Other specified anxiety disorders: Secondary | ICD-10-CM | POA: Diagnosis not present

## 2015-11-09 DIAGNOSIS — R2689 Other abnormalities of gait and mobility: Secondary | ICD-10-CM | POA: Diagnosis not present

## 2015-11-09 DIAGNOSIS — R2681 Unsteadiness on feet: Secondary | ICD-10-CM | POA: Diagnosis not present

## 2015-11-09 DIAGNOSIS — R42 Dizziness and giddiness: Secondary | ICD-10-CM | POA: Diagnosis not present

## 2015-11-09 DIAGNOSIS — R278 Other lack of coordination: Secondary | ICD-10-CM | POA: Diagnosis not present

## 2015-11-10 ENCOUNTER — Other Ambulatory Visit: Payer: Self-pay | Admitting: Internal Medicine

## 2015-11-10 DIAGNOSIS — Z974 Presence of external hearing-aid: Secondary | ICD-10-CM | POA: Diagnosis not present

## 2015-11-10 DIAGNOSIS — H9122 Sudden idiopathic hearing loss, left ear: Secondary | ICD-10-CM | POA: Diagnosis not present

## 2015-11-10 DIAGNOSIS — H903 Sensorineural hearing loss, bilateral: Secondary | ICD-10-CM | POA: Diagnosis not present

## 2015-11-14 DIAGNOSIS — R2681 Unsteadiness on feet: Secondary | ICD-10-CM | POA: Diagnosis not present

## 2015-11-14 DIAGNOSIS — R42 Dizziness and giddiness: Secondary | ICD-10-CM | POA: Diagnosis not present

## 2015-11-14 DIAGNOSIS — F418 Other specified anxiety disorders: Secondary | ICD-10-CM | POA: Diagnosis not present

## 2015-11-14 DIAGNOSIS — R2689 Other abnormalities of gait and mobility: Secondary | ICD-10-CM | POA: Diagnosis not present

## 2015-11-14 DIAGNOSIS — R278 Other lack of coordination: Secondary | ICD-10-CM | POA: Diagnosis not present

## 2015-11-15 ENCOUNTER — Other Ambulatory Visit: Payer: Self-pay | Admitting: Internal Medicine

## 2015-11-16 ENCOUNTER — Other Ambulatory Visit: Payer: Self-pay | Admitting: Internal Medicine

## 2015-11-17 DIAGNOSIS — F418 Other specified anxiety disorders: Secondary | ICD-10-CM | POA: Diagnosis not present

## 2015-11-17 DIAGNOSIS — R2689 Other abnormalities of gait and mobility: Secondary | ICD-10-CM | POA: Diagnosis not present

## 2015-11-17 DIAGNOSIS — R42 Dizziness and giddiness: Secondary | ICD-10-CM | POA: Diagnosis not present

## 2015-11-17 DIAGNOSIS — R2681 Unsteadiness on feet: Secondary | ICD-10-CM | POA: Diagnosis not present

## 2015-11-17 DIAGNOSIS — R278 Other lack of coordination: Secondary | ICD-10-CM | POA: Diagnosis not present

## 2015-11-21 DIAGNOSIS — F418 Other specified anxiety disorders: Secondary | ICD-10-CM | POA: Diagnosis not present

## 2015-11-21 DIAGNOSIS — R278 Other lack of coordination: Secondary | ICD-10-CM | POA: Diagnosis not present

## 2015-11-21 DIAGNOSIS — R2689 Other abnormalities of gait and mobility: Secondary | ICD-10-CM | POA: Diagnosis not present

## 2015-11-21 DIAGNOSIS — R2681 Unsteadiness on feet: Secondary | ICD-10-CM | POA: Diagnosis not present

## 2015-11-21 DIAGNOSIS — R42 Dizziness and giddiness: Secondary | ICD-10-CM | POA: Diagnosis not present

## 2015-11-24 DIAGNOSIS — R2681 Unsteadiness on feet: Secondary | ICD-10-CM | POA: Diagnosis not present

## 2015-11-24 DIAGNOSIS — F418 Other specified anxiety disorders: Secondary | ICD-10-CM | POA: Diagnosis not present

## 2015-11-24 DIAGNOSIS — R2689 Other abnormalities of gait and mobility: Secondary | ICD-10-CM | POA: Diagnosis not present

## 2015-11-24 DIAGNOSIS — R278 Other lack of coordination: Secondary | ICD-10-CM | POA: Diagnosis not present

## 2015-11-24 DIAGNOSIS — R42 Dizziness and giddiness: Secondary | ICD-10-CM | POA: Diagnosis not present

## 2015-11-28 DIAGNOSIS — R42 Dizziness and giddiness: Secondary | ICD-10-CM | POA: Diagnosis not present

## 2015-11-28 DIAGNOSIS — R278 Other lack of coordination: Secondary | ICD-10-CM | POA: Diagnosis not present

## 2015-11-28 DIAGNOSIS — R2689 Other abnormalities of gait and mobility: Secondary | ICD-10-CM | POA: Diagnosis not present

## 2015-11-28 DIAGNOSIS — R2681 Unsteadiness on feet: Secondary | ICD-10-CM | POA: Diagnosis not present

## 2015-11-28 DIAGNOSIS — F418 Other specified anxiety disorders: Secondary | ICD-10-CM | POA: Diagnosis not present

## 2015-12-01 ENCOUNTER — Ambulatory Visit: Payer: Medicare Other | Attending: Ophthalmology | Admitting: Occupational Therapy

## 2015-12-01 DIAGNOSIS — H53413 Scotoma involving central area, bilateral: Secondary | ICD-10-CM | POA: Diagnosis not present
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
## 2015-12-05 ENCOUNTER — Other Ambulatory Visit: Payer: Self-pay | Admitting: *Deleted

## 2015-12-05 ENCOUNTER — Other Ambulatory Visit: Payer: Self-pay | Admitting: Internal Medicine

## 2015-12-05 MED ORDER — PREGABALIN 50 MG PO CAPS: ORAL_CAPSULE | ORAL | Status: DC

## 2015-12-05 NOTE — Telephone Encounter (Signed)
[facility]  

## 2015-12-13 DIAGNOSIS — I10 Essential (primary) hypertension: Secondary | ICD-10-CM | POA: Diagnosis not present

## 2015-12-13 DIAGNOSIS — D509 Iron deficiency anemia, unspecified: Secondary | ICD-10-CM | POA: Diagnosis not present

## 2015-12-13 LAB — BASIC METABOLIC PANEL
BUN: 18 mg/dL (ref 4–21)
Creatinine: 1.1 mg/dL (ref 0.5–1.1)
Glucose: 95 mg/dL
Potassium: 4.1 mmol/L (ref 3.4–5.3)
SODIUM: 143 mmol/L (ref 137–147)

## 2015-12-13 LAB — CBC AND DIFFERENTIAL
HEMOGLOBIN: 12.6 g/dL (ref 12.0–16.0)
Platelets: 205 10*3/uL (ref 150–399)
WBC: 6.3 10^3/mL

## 2015-12-14 ENCOUNTER — Other Ambulatory Visit: Payer: Self-pay

## 2015-12-21 ENCOUNTER — Non-Acute Institutional Stay: Payer: Medicare Other | Admitting: Internal Medicine

## 2015-12-21 ENCOUNTER — Encounter: Payer: Self-pay | Admitting: Internal Medicine

## 2015-12-21 VITALS — BP 104/60 | HR 80 | Temp 97.7°F | Ht 66.0 in | Wt 150.0 lb

## 2015-12-21 DIAGNOSIS — R5383 Other fatigue: Secondary | ICD-10-CM | POA: Diagnosis not present

## 2015-12-21 DIAGNOSIS — I5032 Chronic diastolic (congestive) heart failure: Secondary | ICD-10-CM | POA: Diagnosis not present

## 2015-12-21 DIAGNOSIS — H8303 Labyrinthitis, bilateral: Secondary | ICD-10-CM

## 2015-12-21 DIAGNOSIS — E2839 Other primary ovarian failure: Secondary | ICD-10-CM

## 2015-12-21 DIAGNOSIS — Z780 Asymptomatic menopausal state: Secondary | ICD-10-CM | POA: Diagnosis not present

## 2015-12-21 DIAGNOSIS — I10 Essential (primary) hypertension: Secondary | ICD-10-CM | POA: Diagnosis not present

## 2015-12-21 DIAGNOSIS — N183 Chronic kidney disease, stage 3 unspecified: Secondary | ICD-10-CM

## 2015-12-21 DIAGNOSIS — D509 Iron deficiency anemia, unspecified: Secondary | ICD-10-CM

## 2015-12-21 DIAGNOSIS — I2510 Atherosclerotic heart disease of native coronary artery without angina pectoris: Secondary | ICD-10-CM
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
## 2015-12-22 ENCOUNTER — Other Ambulatory Visit: Payer: Self-pay

## 2015-12-22 DIAGNOSIS — H903 Sensorineural hearing loss, bilateral: Secondary | ICD-10-CM | POA: Diagnosis not present

## 2015-12-22 DIAGNOSIS — H9122 Sudden idiopathic hearing loss, left ear: Secondary | ICD-10-CM | POA: Diagnosis not present

## 2015-12-22 DIAGNOSIS — H9111 Presbycusis, right ear: Secondary | ICD-10-CM | POA: Diagnosis not present

## 2015-12-22 DIAGNOSIS — Z8669 Personal history of other diseases of the nervous system and sense organs: Secondary | ICD-10-CM | POA: Diagnosis not present

## 2015-12-22 MED ORDER — FUROSEMIDE 40 MG PO TABS: ORAL_TABLET | ORAL | Status: DC

## 2015-12-23 ENCOUNTER — Other Ambulatory Visit: Payer: Self-pay | Admitting: Internal Medicine

## 2015-12-23 DIAGNOSIS — H9110 Presbycusis, unspecified ear: Secondary | ICD-10-CM | POA: Insufficient documentation

## 2015-12-28 ENCOUNTER — Non-Acute Institutional Stay: Payer: Medicare Other | Admitting: Internal Medicine

## 2015-12-28 ENCOUNTER — Encounter: Payer: Self-pay | Admitting: Internal Medicine

## 2015-12-28 VITALS — BP 108/60 | HR 75 | Temp 98.0°F | Ht 66.0 in | Wt 147.0 lb

## 2015-12-28 DIAGNOSIS — I2510 Atherosclerotic heart disease of native coronary artery without angina pectoris: Secondary | ICD-10-CM

## 2015-12-28 DIAGNOSIS — R531 Weakness: Secondary | ICD-10-CM | POA: Diagnosis not present

## 2015-12-28 DIAGNOSIS — R197 Diarrhea, unspecified: Secondary | ICD-10-CM

## 2015-12-28 DIAGNOSIS — R269 Unspecified abnormalities of gait and mobility: Secondary | ICD-10-CM | POA: Diagnosis not present

## 2015-12-28 DIAGNOSIS — H8303 Labyrinthitis, bilateral: Secondary | ICD-10-CM | POA: Diagnosis not present
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
## 2015-12-29 DIAGNOSIS — R531 Weakness: Secondary | ICD-10-CM | POA: Diagnosis not present

## 2015-12-29 DIAGNOSIS — R197 Diarrhea, unspecified: Secondary | ICD-10-CM | POA: Diagnosis not present

## 2015-12-29 DIAGNOSIS — D509 Iron deficiency anemia, unspecified: Secondary | ICD-10-CM | POA: Diagnosis not present

## 2015-12-29 DIAGNOSIS — Z85038 Personal history of other malignant neoplasm of large intestine: Secondary | ICD-10-CM | POA: Diagnosis not present

## 2015-12-29 LAB — BASIC METABOLIC PANEL
BUN: 17 mg/dL (ref 4–21)
CREATININE: 1.2 mg/dL — AB (ref 0.5–1.1)
GLUCOSE: 87 mg/dL
Potassium: 3.8 mmol/L (ref 3.4–5.3)
Sodium: 142 mmol/L (ref 137–147)

## 2015-12-29 LAB — CBC AND DIFFERENTIAL
HEMATOCRIT: 39 % (ref 36–46)
Hemoglobin: 12.9 g/dL (ref 12.0–16.0)
PLATELETS: 223 10*3/uL (ref 150–399)
WBC: 7.1 10^3/mL

## 2015-12-30 ENCOUNTER — Other Ambulatory Visit: Payer: Self-pay | Admitting: Internal Medicine

## 2015-12-30 ENCOUNTER — Telehealth: Payer: Self-pay | Admitting: *Deleted

## 2015-12-30 DIAGNOSIS — R197 Diarrhea, unspecified: Secondary | ICD-10-CM

## 2015-12-30 DIAGNOSIS — R531 Weakness: Secondary | ICD-10-CM

## 2015-12-30 NOTE — Telephone Encounter (Signed)
Patient called and stated that she saw you on 12/28/2015 and last night she was sicker than she's been in several days. Patient stated that she would like a referral to her GI Dr. Cristina Gong.  Please Advise.

## 2015-12-30 NOTE — Telephone Encounter (Signed)
Ok, will enter referral

## 2016-01-02 DIAGNOSIS — R195 Other fecal abnormalities: Secondary | ICD-10-CM | POA: Diagnosis not present

## 2016-01-02 DIAGNOSIS — R197 Diarrhea, unspecified: Secondary | ICD-10-CM | POA: Diagnosis not present

## 2016-01-03 ENCOUNTER — Other Ambulatory Visit: Payer: Self-pay

## 2016-01-03 ENCOUNTER — Telehealth: Payer: Self-pay

## 2016-01-03 NOTE — Telephone Encounter (Signed)
Called patient about stool test were negative, blood counts normal, kidney's ok, per Dr. Mariea Clonts.  Patient saw Dr. Osborn Coho NP she gave her Metronizole take three daily for 10 days and to double up on the Imodium. Had a 99 temp last night today it was 97, abdomen is sore. Her stools are soft now, not watery wants to know if she should continue double dose of Imodium. Per Dr. Mariea Clonts is can go back to single dose, but if the diarrhea starts again go back to the double. She has a follow-up appt with Dr. Cristina Gong Friday  March 10th.

## 2016-01-05 ENCOUNTER — Other Ambulatory Visit: Payer: Self-pay

## 2016-01-05 ENCOUNTER — Other Ambulatory Visit: Payer: Self-pay | Admitting: Cardiology

## 2016-01-05 MED ORDER — PREGABALIN 50 MG PO CAPS: ORAL_CAPSULE | ORAL | Status: DC

## 2016-01-05 MED ORDER — BUPROPION HCL ER (XL) 150 MG PO TB24: ORAL_TABLET | ORAL | Status: DC

## 2016-01-05 MED ORDER — ALPRAZOLAM 0.25 MG PO TABS: ORAL_TABLET | ORAL | Status: DC

## 2016-01-05 MED ORDER — CARVEDILOL 3.125 MG PO TABS: ORAL_TABLET | ORAL | Status: DC

## 2016-01-06 ENCOUNTER — Other Ambulatory Visit: Payer: Self-pay | Admitting: Internal Medicine

## 2016-01-06 DIAGNOSIS — K591 Functional diarrhea: Secondary | ICD-10-CM | POA: Diagnosis not present

## 2016-01-10 DIAGNOSIS — H903 Sensorineural hearing loss, bilateral: Secondary | ICD-10-CM | POA: Diagnosis not present

## 2016-01-12 ENCOUNTER — Ambulatory Visit
Admission: RE | Admit: 2016-01-12 | Discharge: 2016-01-12 | Disposition: A | Discharge status: Home / Self Care | Payer: Medicare Other | Source: Ambulatory Visit | Attending: Gastroenterology | Admitting: Gastroenterology

## 2016-01-12 ENCOUNTER — Other Ambulatory Visit: Payer: Self-pay | Admitting: Gastroenterology

## 2016-01-12 ENCOUNTER — Other Ambulatory Visit: Payer: Self-pay

## 2016-01-12 DIAGNOSIS — R140 Abdominal distension (gaseous): Secondary | ICD-10-CM | POA: Diagnosis not present

## 2016-01-12 DIAGNOSIS — K591 Functional diarrhea: Secondary | ICD-10-CM | POA: Diagnosis not present

## 2016-01-12 MED ORDER — LEVOTHYROXINE SODIUM 75 MCG PO TABS: 75.0000 ug | ORAL_TABLET | Freq: Every day | ORAL | Status: DC

## 2016-01-12 NOTE — Telephone Encounter (Signed)
Refill request received from [facility] for Synthyroid 75 mcg tablets.  Prescription sent to pharmacy.

## 2016-01-13 ENCOUNTER — Other Ambulatory Visit: Payer: Self-pay | Admitting: Internal Medicine

## 2016-01-18 ENCOUNTER — Other Ambulatory Visit: Payer: Self-pay | Admitting: Gastroenterology

## 2016-01-18 DIAGNOSIS — K52832 Lymphocytic colitis: Secondary | ICD-10-CM | POA: Diagnosis not present

## 2016-01-18 DIAGNOSIS — K5140 Inflammatory polyps of colon without complications: Secondary | ICD-10-CM | POA: Diagnosis not present

## 2016-01-18 DIAGNOSIS — K52839 Microscopic colitis, unspecified: Secondary | ICD-10-CM | POA: Diagnosis not present

## 2016-01-18 DIAGNOSIS — D122 Benign neoplasm of ascending colon: Secondary | ICD-10-CM | POA: Diagnosis not present

## 2016-01-18 DIAGNOSIS — R197 Diarrhea, unspecified: Secondary | ICD-10-CM | POA: Diagnosis not present

## 2016-01-27 ENCOUNTER — Other Ambulatory Visit: Payer: Self-pay | Admitting: Internal Medicine

## 2016-01-27 DIAGNOSIS — K52832 Lymphocytic colitis: Secondary | ICD-10-CM | POA: Diagnosis not present

## 2016-01-27 NOTE — Addendum Note (Signed)
Addended by: Logan Bores on: 01/27/2016 04:33 PM   Modules accepted: Orders

## 2016-02-02 ENCOUNTER — Other Ambulatory Visit: Payer: Self-pay | Admitting: Internal Medicine

## 2016-02-02 ENCOUNTER — Telehealth: Payer: Self-pay | Admitting: *Deleted

## 2016-02-02 NOTE — Telephone Encounter (Signed)
Spoke with patient regarding her bone density, she stated that she just spoke with [facility] regarding her bone density and everything has been scheduled for May.

## 2016-02-14 ENCOUNTER — Other Ambulatory Visit: Payer: Self-pay | Admitting: Internal Medicine

## 2016-02-15 ENCOUNTER — Other Ambulatory Visit: Payer: Self-pay | Admitting: Internal Medicine

## 2016-02-27 ENCOUNTER — Other Ambulatory Visit: Payer: Medicare Other

## 2016-02-28 ENCOUNTER — Telehealth: Payer: Self-pay | Admitting: Cardiology

## 2016-02-28 DIAGNOSIS — R002 Palpitations: Secondary | ICD-10-CM

## 2016-02-28 NOTE — Telephone Encounter (Signed)
New message    Pt verbalized that she wants to speak to rn  About her possibly needing to increase her medications her was 78 HR 02/25/16  Pt was not in distress she just felt not normal

## 2016-02-28 NOTE — Telephone Encounter (Signed)
I spoke with the pt and she contacted the office to discuss an episode that occurred on 02/25/16.  The pt said she noticed her heart beat "thumping" in her head around noon and then while traveling to [place] with her caregiver she asked her caregiver to check the pulse. The caregiver said her pulse was 78 but she did note that the pulse was really fast and then really slow which was unusual for the pt.  The pt did not feel her heart racing.  The pt did attend [place] and when she got home she ate dinner in the dining room at [facility].  The pt said this "thumping" lasted for 4-5 hours.  Since this episode the pt has felt fine. I recommended that the pt continue with observation at this time and if she has any symptoms then she should contact the nurse at [facility] so that vitals can be obtained.  She will also contact the office if she notices any increase in symptoms. Pt agreed with plan.

## 2016-02-29 NOTE — Addendum Note (Signed)
Addended by: Katrine Coho on: 02/29/2016 11:50 AM   Modules accepted: Orders

## 2016-02-29 NOTE — Telephone Encounter (Signed)
Pt agreed to 48 hour monitor, will forward to [facility] to arrange appt time for 48 hour monitor.

## 2016-02-29 NOTE — Addendum Note (Signed)
Addended by: Theone Murdoch B on: 02/29/2016 05:07 PM   Modules accepted: Orders

## 2016-02-29 NOTE — Telephone Encounter (Signed)
We can have her wear 48 hour holter monitor to see if any significant arrhythmias.

## 2016-03-01 ENCOUNTER — Other Ambulatory Visit: Payer: Medicare Other

## 2016-03-08 ENCOUNTER — Ambulatory Visit (INDEPENDENT_AMBULATORY_CARE_PROVIDER_SITE_OTHER): Payer: Medicare Other

## 2016-03-08 DIAGNOSIS — R002 Palpitations: Secondary | ICD-10-CM

## 2016-03-13 ENCOUNTER — Other Ambulatory Visit: Payer: Self-pay | Admitting: Internal Medicine

## 2016-03-13 DIAGNOSIS — E2839 Other primary ovarian failure: Secondary | ICD-10-CM

## 2016-03-14 ENCOUNTER — Telehealth: Payer: Self-pay | Admitting: Cardiology

## 2016-03-14 NOTE — Telephone Encounter (Signed)
PT  AWARE  MONITOR HAS  NOT  BEEN   REVIEWED,  AS  OF YET.  WILL  CALL ONCE  MD  READS .Debra Brooks

## 2016-03-14 NOTE — Telephone Encounter (Signed)
New Message  Pt called request a call back with Monitor results

## 2016-03-15 ENCOUNTER — Ambulatory Visit
Admission: RE | Admit: 2016-03-15 | Discharge: 2016-03-15 | Disposition: A | Discharge status: Home / Self Care | Payer: Medicare Other | Source: Ambulatory Visit | Attending: Internal Medicine | Admitting: Internal Medicine

## 2016-03-15 ENCOUNTER — Other Ambulatory Visit: Payer: Medicare Other

## 2016-03-15 DIAGNOSIS — E2839 Other primary ovarian failure: Secondary | ICD-10-CM

## 2016-03-15 DIAGNOSIS — M810 Age-related osteoporosis without current pathological fracture: Secondary | ICD-10-CM | POA: Diagnosis not present

## 2016-03-15 DIAGNOSIS — Z780 Asymptomatic menopausal state: Secondary | ICD-10-CM | POA: Diagnosis not present

## 2016-03-16 ENCOUNTER — Encounter: Payer: Self-pay | Admitting: *Deleted

## 2016-03-16 ENCOUNTER — Telehealth: Payer: Self-pay | Admitting: Nurse Practitioner

## 2016-03-16 NOTE — Telephone Encounter (Signed)
Pt advised monitor results not finalized, I will call when they are complete.

## 2016-03-16 NOTE — Telephone Encounter (Signed)
°  New Prob   Pt is calling to follow up on cardiac monitor results. Please call.

## 2016-03-19 NOTE — Telephone Encounter (Signed)
I spoke with patient about recent monitor results.

## 2016-03-20 ENCOUNTER — Ambulatory Visit: Payer: Medicare Other | Admitting: Nurse Practitioner

## 2016-03-20 ENCOUNTER — Telehealth: Payer: Self-pay | Admitting: Nurse Practitioner

## 2016-03-20 NOTE — Telephone Encounter (Signed)
New message     Talk to Cecille Rubin about monitor results on patient.  Son want to talk to her before the appt tomorrow.  If possible, please call today

## 2016-03-20 NOTE — Telephone Encounter (Signed)
Have spoken to Debra Brooks regarding the monitor showing atrial fib - have explained what this means and how treated. Explained that we will need to have conversation about possible options - anticoagulation, possible cardioversion and risk of anticoagulation, etc.   He was quite appreciative for the call.

## 2016-03-21 ENCOUNTER — Encounter: Payer: Self-pay | Admitting: Nurse Practitioner

## 2016-03-21 ENCOUNTER — Ambulatory Visit (INDEPENDENT_AMBULATORY_CARE_PROVIDER_SITE_OTHER): Payer: Medicare Other | Admitting: Nurse Practitioner

## 2016-03-21 VITALS — BP 130/64 | HR 58 | Ht 65.0 in | Wt 136.1 lb

## 2016-03-21 DIAGNOSIS — K559 Vascular disorder of intestine, unspecified: Secondary | ICD-10-CM | POA: Diagnosis not present

## 2016-03-21 DIAGNOSIS — I4891 Unspecified atrial fibrillation: Secondary | ICD-10-CM

## 2016-03-21 DIAGNOSIS — R002 Palpitations: Secondary | ICD-10-CM | POA: Diagnosis not present

## 2016-03-21 DIAGNOSIS — I2510 Atherosclerotic heart disease of native coronary artery without angina pectoris: Secondary | ICD-10-CM

## 2016-03-21 DIAGNOSIS — I5032 Chronic diastolic (congestive) heart failure: Secondary | ICD-10-CM

## 2016-03-21 LAB — CBC
HCT: 39.9 % (ref 35.0–45.0)
Hemoglobin: 13.1 g/dL (ref 11.7–15.5)
MCH: 31.2 pg (ref 27.0–33.0)
MCHC: 32.8 g/dL (ref 32.0–36.0)
MCV: 95 fL (ref 80.0–100.0)
MPV: 10.7 fL (ref 7.5–12.5)
Platelets: 223 10*3/uL (ref 140–400)
RBC: 4.2 MIL/uL (ref 3.80–5.10)
RDW: 14.3 % (ref 11.0–15.0)
WBC: 8.3 10*3/uL (ref 3.8–10.8)

## 2016-03-21 LAB — BASIC METABOLIC PANEL
BUN: 19 mg/dL (ref 7–25)
CO2: 28 mmol/L (ref 20–31)
Calcium: 9 mg/dL (ref 8.6–10.4)
Chloride: 102 mmol/L (ref 98–110)
Creat: 0.99 mg/dL — ABNORMAL HIGH (ref 0.60–0.88)
Glucose, Bld: 78 mg/dL (ref 65–99)
Potassium: 3.8 mmol/L (ref 3.5–5.3)
Sodium: 140 mmol/L (ref 135–146)

## 2016-03-21 LAB — TSH: TSH: 1.73 mIU/L

## 2016-03-21 LAB — DIGOXIN LEVEL: Digoxin Level: 1.1 ug/L (ref 0.8–2.0)

## 2016-03-21 MED ORDER — APIXABAN 5 MG PO TABS: 5.0000 mg | ORAL_TABLET | Freq: Two times a day (BID) | ORAL | Status: DC

## 2016-03-21 NOTE — Patient Instructions (Addendum)
We will be checking the following labs today - BMET, CBC, TSH   Medication Instructions:    Continue with your current medicines. BUT  STOP aspirin after tomorrow's dose  START Eliquis 5 mg twice a day - start this evening. We will try to give samples    Testing/Procedures To Be Arranged:  N/A  Follow-Up:   See me in one month - we will repeat labs when you come back but you do not need to fast    Other Special Instructions:   N/A    If you need a refill on your cardiac medications before your next appointment, please call your pharmacy.   Call the [place] office at [phone] if you have any questions, problems or concerns.
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
## 2016-03-23 ENCOUNTER — Telehealth: Payer: Self-pay

## 2016-03-23 NOTE — Telephone Encounter (Signed)
Opened in error

## 2016-03-23 NOTE — Telephone Encounter (Signed)
Prior auth obtained for Eliquis 5 mg through [employer]. Good through 10/28/2016. PA- [id number].

## 2016-03-28 ENCOUNTER — Encounter: Payer: Self-pay | Admitting: Internal Medicine

## 2016-03-28 ENCOUNTER — Non-Acute Institutional Stay: Payer: Medicare Other | Admitting: Internal Medicine

## 2016-03-28 VITALS — BP 118/70 | HR 58 | Temp 97.6°F | Ht 65.0 in | Wt 139.0 lb

## 2016-03-28 DIAGNOSIS — K529 Noninfective gastroenteritis and colitis, unspecified: Secondary | ICD-10-CM

## 2016-03-28 DIAGNOSIS — E785 Hyperlipidemia, unspecified: Secondary | ICD-10-CM | POA: Diagnosis not present

## 2016-03-28 DIAGNOSIS — M810 Age-related osteoporosis without current pathological fracture: Secondary | ICD-10-CM

## 2016-03-28 DIAGNOSIS — I482 Chronic atrial fibrillation, unspecified: Secondary | ICD-10-CM

## 2016-03-28 DIAGNOSIS — N183 Chronic kidney disease, stage 3 unspecified: Secondary | ICD-10-CM

## 2016-03-28 DIAGNOSIS — K52832 Lymphocytic colitis: Secondary | ICD-10-CM | POA: Insufficient documentation

## 2016-03-28 DIAGNOSIS — H3530 Unspecified macular degeneration: Secondary | ICD-10-CM

## 2016-03-28 DIAGNOSIS — I2510 Atherosclerotic heart disease of native coronary artery without angina pectoris: Secondary | ICD-10-CM | POA: Diagnosis not present
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
## 2016-03-29 DIAGNOSIS — K52832 Lymphocytic colitis: Secondary | ICD-10-CM | POA: Diagnosis not present

## 2016-03-29 DIAGNOSIS — R634 Abnormal weight loss: Secondary | ICD-10-CM | POA: Diagnosis not present

## 2016-04-02 ENCOUNTER — Encounter: Payer: Self-pay | Admitting: Internal Medicine

## 2016-04-03 DIAGNOSIS — H353221 Exudative age-related macular degeneration, left eye, with active choroidal neovascularization: Secondary | ICD-10-CM | POA: Diagnosis not present

## 2016-04-03 DIAGNOSIS — H35372 Puckering of macula, left eye: Secondary | ICD-10-CM | POA: Diagnosis not present

## 2016-04-03 DIAGNOSIS — Z961 Presence of intraocular lens: Secondary | ICD-10-CM | POA: Diagnosis not present

## 2016-04-03 DIAGNOSIS — H18423 Band keratopathy, bilateral: Secondary | ICD-10-CM | POA: Diagnosis not present

## 2016-04-03 DIAGNOSIS — H353114 Nonexudative age-related macular degeneration, right eye, advanced atrophic with subfoveal involvement: Secondary | ICD-10-CM | POA: Diagnosis not present

## 2016-04-04 ENCOUNTER — Other Ambulatory Visit: Payer: Self-pay | Admitting: Internal Medicine

## 2016-04-04 ENCOUNTER — Other Ambulatory Visit: Payer: Self-pay | Admitting: *Deleted

## 2016-04-04 MED ORDER — PREGABALIN 50 MG PO CAPS: ORAL_CAPSULE | ORAL | Status: DC

## 2016-04-04 NOTE — Telephone Encounter (Signed)
[facility] requested refill for patient.

## 2016-04-17 ENCOUNTER — Telehealth: Payer: Self-pay

## 2016-04-17 NOTE — Telephone Encounter (Signed)
Debra Brooks Reason at 04/17/2016 11:26 AM     Status: Signed       Expand All Collapse All   New Message  Pt call requesting to speak with RN. Pt states she was seen in our office on 5/24 and pt wants to know if she should keep the appt set for 6/23 please call back to discuss        Per Lori's OV note,  "Follow-Up:  See me in one month - we will repeat labs when you come back but you do not need to fast"  Left message for patient that she needs to keep appointment with Cecille Rubin 6/23. Instructed her to call back with any questions or concerns.

## 2016-04-17 NOTE — Telephone Encounter (Signed)
New Message  Pt call requesting to speak with RN. Pt states she was seen in our office on 5/24 and pt wants to know if she should keep the appt set for 6/23 please call back to discuss

## 2016-04-19 ENCOUNTER — Other Ambulatory Visit: Payer: Self-pay | Admitting: *Deleted

## 2016-04-19 ENCOUNTER — Other Ambulatory Visit: Payer: Self-pay | Admitting: Internal Medicine

## 2016-04-19 MED ORDER — FUROSEMIDE 40 MG PO TABS: ORAL_TABLET | ORAL | Status: DC

## 2016-04-19 NOTE — Telephone Encounter (Signed)
[facility]  

## 2016-04-20 ENCOUNTER — Ambulatory Visit (INDEPENDENT_AMBULATORY_CARE_PROVIDER_SITE_OTHER): Payer: Medicare Other | Admitting: Nurse Practitioner

## 2016-04-20 ENCOUNTER — Encounter: Payer: Self-pay | Admitting: Nurse Practitioner

## 2016-04-20 ENCOUNTER — Other Ambulatory Visit: Payer: Self-pay | Admitting: *Deleted

## 2016-04-20 VITALS — BP 120/80 | HR 63 | Ht 65.5 in | Wt 141.0 lb

## 2016-04-20 DIAGNOSIS — I10 Essential (primary) hypertension: Secondary | ICD-10-CM

## 2016-04-20 DIAGNOSIS — Z7901 Long term (current) use of anticoagulants: Secondary | ICD-10-CM | POA: Diagnosis not present

## 2016-04-20 DIAGNOSIS — I2510 Atherosclerotic heart disease of native coronary artery without angina pectoris: Secondary | ICD-10-CM | POA: Diagnosis not present

## 2016-04-20 DIAGNOSIS — I5032 Chronic diastolic (congestive) heart failure: Secondary | ICD-10-CM

## 2016-04-20 LAB — CBC
HCT: 37.2 % (ref 35.0–45.0)
Hemoglobin: 12.2 g/dL (ref 11.7–15.5)
MCH: 31 pg (ref 27.0–33.0)
MCHC: 32.8 g/dL (ref 32.0–36.0)
MCV: 94.7 fL (ref 80.0–100.0)
MPV: 9.6 fL (ref 7.5–12.5)
Platelets: 246 10*3/uL (ref 140–400)
RBC: 3.93 MIL/uL (ref 3.80–5.10)
RDW: 14.2 % (ref 11.0–15.0)
WBC: 7.8 10*3/uL (ref 3.8–10.8)

## 2016-04-20 LAB — BASIC METABOLIC PANEL
BUN: 20 mg/dL (ref 7–25)
CO2: 31 mmol/L (ref 20–31)
Calcium: 9.6 mg/dL (ref 8.6–10.4)
Chloride: 102 mmol/L (ref 98–110)
Creat: 0.97 mg/dL — ABNORMAL HIGH (ref 0.60–0.88)
Glucose, Bld: 90 mg/dL (ref 65–99)
Potassium: 3.5 mmol/L (ref 3.5–5.3)
Sodium: 140 mmol/L (ref 135–146)

## 2016-04-20 MED ORDER — ALPRAZOLAM 0.25 MG PO TABS: ORAL_TABLET | ORAL | Status: DC

## 2016-04-20 NOTE — Telephone Encounter (Signed)
[facility]  
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
## 2016-04-20 NOTE — Patient Instructions (Signed)
We will be checking the following labs today - BMET and CBC   Medication Instructions:    Continue with your current medicines.     Testing/Procedures To Be Arranged:  N/A  Follow-Up:   See me in 4 months    Other Special Instructions:   N/A    If you need a refill on your cardiac medications before your next appointment, please call your pharmacy.   Call the [facility] office at [phone] if you have any questions, problems or concerns.      

## 2016-05-08 DIAGNOSIS — H353221 Exudative age-related macular degeneration, left eye, with active choroidal neovascularization: Secondary | ICD-10-CM | POA: Diagnosis not present

## 2016-05-15 DIAGNOSIS — L680 Hirsutism: Secondary | ICD-10-CM | POA: Diagnosis not present

## 2016-05-15 DIAGNOSIS — L821 Other seborrheic keratosis: Secondary | ICD-10-CM | POA: Diagnosis not present

## 2016-05-30 ENCOUNTER — Ambulatory Visit: Payer: Medicare Other | Attending: Ophthalmology | Admitting: Occupational Therapy

## 2016-05-30 DIAGNOSIS — H53413 Scotoma involving central area, bilateral: Secondary | ICD-10-CM | POA: Diagnosis not present

## 2016-05-31 ENCOUNTER — Other Ambulatory Visit: Payer: Self-pay | Admitting: Internal Medicine

## 2016-06-01 ENCOUNTER — Other Ambulatory Visit: Payer: Self-pay

## 2016-06-01 MED ORDER — ALPRAZOLAM 0.25 MG PO TABS: ORAL_TABLET | ORAL | 0 refills | Status: DC

## 2016-06-01 MED ORDER — FUROSEMIDE 40 MG PO TABS: ORAL_TABLET | ORAL | 3 refills | Status: DC
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
## 2016-06-01 NOTE — Telephone Encounter (Signed)
Received phone call from [facility] about patient's Rx -gave verbal ok.

## 2016-06-04 ENCOUNTER — Telehealth: Payer: Self-pay

## 2016-06-04 ENCOUNTER — Other Ambulatory Visit: Payer: Self-pay | Admitting: Internal Medicine

## 2016-06-04 ENCOUNTER — Other Ambulatory Visit: Payer: Self-pay

## 2016-06-04 MED ORDER — PREGABALIN 50 MG PO CAPS: 50.0000 mg | ORAL_CAPSULE | Freq: Every day | ORAL | 5 refills | Status: DC

## 2016-06-04 NOTE — Telephone Encounter (Signed)
Spoke with patient and advised that Debra Brooks is working on this, per patient she just wanted to know her co-pay, I advised as soon as we hear something I will let her know.

## 2016-06-04 NOTE — Telephone Encounter (Signed)
Patient was told to contact her insurance company to see if prolia was covered and she was also told Karena Addison would contact them . Patient was calling requesting to speak with Karena Addison (Dr.Reed's assistant) to see if she already contacted the insurance company and if yes what did they say.   Dee please review and follow-up with patient.

## 2016-06-05 DIAGNOSIS — H353221 Exudative age-related macular degeneration, left eye, with active choroidal neovascularization: Secondary | ICD-10-CM | POA: Diagnosis not present

## 2016-06-18 ENCOUNTER — Other Ambulatory Visit: Payer: Self-pay | Admitting: Internal Medicine

## 2016-06-21 ENCOUNTER — Other Ambulatory Visit: Payer: Self-pay

## 2016-06-21 ENCOUNTER — Other Ambulatory Visit: Payer: Self-pay | Admitting: Internal Medicine

## 2016-06-21 DIAGNOSIS — B351 Tinea unguium: Secondary | ICD-10-CM | POA: Diagnosis not present

## 2016-06-21 MED ORDER — ALPRAZOLAM 0.25 MG PO TABS: ORAL_TABLET | ORAL | 0 refills | Status: DC

## 2016-06-21 MED ORDER — POTASSIUM CHLORIDE CRYS ER 20 MEQ PO TBCR: EXTENDED_RELEASE_TABLET | ORAL | 5 refills | Status: DC

## 2016-07-04 ENCOUNTER — Non-Acute Institutional Stay: Payer: Medicare Other | Admitting: Internal Medicine

## 2016-07-04 ENCOUNTER — Encounter: Payer: Self-pay | Admitting: Internal Medicine

## 2016-07-04 VITALS — BP 118/68 | HR 57 | Temp 98.8°F | Wt 141.0 lb

## 2016-07-04 DIAGNOSIS — J440 Chronic obstructive pulmonary disease with acute lower respiratory infection: Secondary | ICD-10-CM | POA: Diagnosis not present

## 2016-07-04 DIAGNOSIS — K529 Noninfective gastroenteritis and colitis, unspecified: Secondary | ICD-10-CM

## 2016-07-04 DIAGNOSIS — I2510 Atherosclerotic heart disease of native coronary artery without angina pectoris: Secondary | ICD-10-CM

## 2016-07-04 DIAGNOSIS — H3530 Unspecified macular degeneration: Secondary | ICD-10-CM

## 2016-07-04 DIAGNOSIS — J209 Acute bronchitis, unspecified: Secondary | ICD-10-CM

## 2016-07-04 MED ORDER — IPRATROPIUM-ALBUTEROL 0.5-2.5 (3) MG/3ML IN SOLN: 3.0000 mL | Freq: Four times a day (QID) | RESPIRATORY_TRACT | 1 refills | Status: DC | PRN
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
## 2016-07-16 ENCOUNTER — Other Ambulatory Visit: Payer: Self-pay | Admitting: Internal Medicine

## 2016-07-25 ENCOUNTER — Encounter: Payer: Self-pay | Admitting: Internal Medicine

## 2016-07-31 DIAGNOSIS — H353221 Exudative age-related macular degeneration, left eye, with active choroidal neovascularization: Secondary | ICD-10-CM | POA: Diagnosis not present

## 2016-07-31 DIAGNOSIS — Z961 Presence of intraocular lens: Secondary | ICD-10-CM | POA: Diagnosis not present

## 2016-07-31 DIAGNOSIS — H35372 Puckering of macula, left eye: Secondary | ICD-10-CM | POA: Diagnosis not present

## 2016-07-31 DIAGNOSIS — H353114 Nonexudative age-related macular degeneration, right eye, advanced atrophic with subfoveal involvement: Secondary | ICD-10-CM | POA: Diagnosis not present

## 2016-08-02 DIAGNOSIS — H9113 Presbycusis, bilateral: Secondary | ICD-10-CM | POA: Diagnosis not present

## 2016-08-02 DIAGNOSIS — Z8669 Personal history of other diseases of the nervous system and sense organs: Secondary | ICD-10-CM | POA: Diagnosis not present

## 2016-08-02 DIAGNOSIS — H9111 Presbycusis, right ear: Secondary | ICD-10-CM | POA: Diagnosis not present

## 2016-08-02 DIAGNOSIS — H9122 Sudden idiopathic hearing loss, left ear: Secondary | ICD-10-CM | POA: Diagnosis not present

## 2016-08-10 ENCOUNTER — Other Ambulatory Visit: Payer: Self-pay | Admitting: Cardiology

## 2016-08-13 ENCOUNTER — Other Ambulatory Visit: Payer: Self-pay | Admitting: Internal Medicine

## 2016-08-13 ENCOUNTER — Telehealth: Payer: Self-pay | Admitting: *Deleted

## 2016-08-13 NOTE — Telephone Encounter (Signed)
Patient called and wanted to let you and Karena Addison know that you will be receiving a brand new up and down exam table for [facility]. Also wanted to know if you needed her to get bloodwork before her next appointment.

## 2016-08-13 NOTE — Telephone Encounter (Signed)
Let's check her bmp, ionized calcium and 25 OH vitamin D due to osteoporosis and vitamin D deficiency (should be covered if she has not had vitamin D check this year).

## 2016-08-14 ENCOUNTER — Telehealth: Payer: Self-pay | Admitting: *Deleted

## 2016-08-14 NOTE — Telephone Encounter (Signed)
Received form from patient from [facility] [phone] Fax: [phone]. Patient stated that she is attempting to file a claim for reimbursement for a flight she missed to to being in the hospital from 10/19/15-11/12/15. Would like the form mailed back to her once completed. Placed in folder for Dr. Mariea Clonts to review and sign.

## 2016-08-14 NOTE — Telephone Encounter (Signed)
Dee, will you write up a slip and give to [name] out there at [facility] for her to have labwork done?

## 2016-08-15 NOTE — Telephone Encounter (Signed)
Yes this was done

## 2016-08-21 ENCOUNTER — Encounter: Payer: Self-pay | Admitting: Internal Medicine

## 2016-08-21 DIAGNOSIS — R5383 Other fatigue: Secondary | ICD-10-CM | POA: Diagnosis not present

## 2016-08-21 DIAGNOSIS — I10 Essential (primary) hypertension: Secondary | ICD-10-CM | POA: Diagnosis not present

## 2016-08-21 DIAGNOSIS — E559 Vitamin D deficiency, unspecified: Secondary | ICD-10-CM | POA: Diagnosis not present

## 2016-08-21 DIAGNOSIS — R110 Nausea: Secondary | ICD-10-CM | POA: Diagnosis not present

## 2016-08-21 LAB — BASIC METABOLIC PANEL
BUN: 18 mg/dL (ref 4–21)
Creatinine: 1 mg/dL (ref 0.5–1.1)
Glucose: 82 mg/dL
Potassium: 4 mmol/L (ref 3.4–5.3)
Sodium: 143 mmol/L (ref 137–147)

## 2016-08-22 ENCOUNTER — Ambulatory Visit (INDEPENDENT_AMBULATORY_CARE_PROVIDER_SITE_OTHER): Payer: Medicare Other | Admitting: Nurse Practitioner

## 2016-08-22 ENCOUNTER — Encounter: Payer: Self-pay | Admitting: Nurse Practitioner

## 2016-08-22 VITALS — BP 128/70 | HR 63 | Ht 65.0 in | Wt 147.8 lb

## 2016-08-22 DIAGNOSIS — I10 Essential (primary) hypertension: Secondary | ICD-10-CM

## 2016-08-22 DIAGNOSIS — I5032 Chronic diastolic (congestive) heart failure: Secondary | ICD-10-CM

## 2016-08-22 DIAGNOSIS — I481 Persistent atrial fibrillation: Secondary | ICD-10-CM

## 2016-08-22 DIAGNOSIS — Z7901 Long term (current) use of anticoagulants: Secondary | ICD-10-CM

## 2016-08-22 DIAGNOSIS — I4819 Other persistent atrial fibrillation: Secondary | ICD-10-CM

## 2016-08-22 DIAGNOSIS — I2510 Atherosclerotic heart disease of native coronary artery without angina pectoris: Secondary | ICD-10-CM

## 2016-08-22 NOTE — Patient Instructions (Addendum)
We will be checking the following labs today - NONE  I will look for your labs that you just had drawn.    Medication Instructions:    Continue with your current medicines.     Testing/Procedures To Be Arranged:  N/A  Follow-Up:   See me in 4 months    Other Special Instructions:   N/A    If you need a refill on your cardiac medications before your next appointment, please call your pharmacy.   Call the [place] office at [phone] if you have any questions, problems or concerns.
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
## 2016-08-23 ENCOUNTER — Encounter: Payer: Self-pay | Admitting: Internal Medicine

## 2016-08-23 DIAGNOSIS — B351 Tinea unguium: Secondary | ICD-10-CM | POA: Diagnosis not present

## 2016-08-23 DIAGNOSIS — Z23 Encounter for immunization: Secondary | ICD-10-CM | POA: Diagnosis not present

## 2016-08-28 ENCOUNTER — Telehealth: Payer: Self-pay | Admitting: *Deleted

## 2016-08-28 ENCOUNTER — Encounter: Payer: Self-pay | Admitting: Internal Medicine

## 2016-08-28 ENCOUNTER — Other Ambulatory Visit: Payer: Self-pay | Admitting: *Deleted

## 2016-08-28 DIAGNOSIS — E785 Hyperlipidemia, unspecified: Secondary | ICD-10-CM

## 2016-08-28 DIAGNOSIS — Z79899 Other long term (current) drug therapy: Secondary | ICD-10-CM

## 2016-08-28 DIAGNOSIS — I482 Chronic atrial fibrillation, unspecified: Secondary | ICD-10-CM

## 2016-08-28 NOTE — Telephone Encounter (Signed)
Pt called in today to change lab appointment to tomorrow.  Changed in system.

## 2016-08-28 NOTE — Telephone Encounter (Signed)
S/w Dr. Cyndi Lennert office stated pt only had a bmet drawn.  Stated pt needs to come back to our office for lipid/lft/dig/cbc.  S/w pt is coming in on Thursday, November 2 for lab work. Orders in and linked, appointment made.

## 2016-08-29 ENCOUNTER — Other Ambulatory Visit: Payer: Medicare Other | Admitting: *Deleted

## 2016-08-29 ENCOUNTER — Non-Acute Institutional Stay: Payer: Medicare Other | Admitting: Internal Medicine

## 2016-08-29 VITALS — BP 128/70 | HR 66 | Temp 97.6°F | Wt 144.0 lb

## 2016-08-29 DIAGNOSIS — I482 Chronic atrial fibrillation, unspecified: Secondary | ICD-10-CM

## 2016-08-29 DIAGNOSIS — I5032 Chronic diastolic (congestive) heart failure: Secondary | ICD-10-CM | POA: Diagnosis not present

## 2016-08-29 DIAGNOSIS — N183 Chronic kidney disease, stage 3 unspecified: Secondary | ICD-10-CM

## 2016-08-29 DIAGNOSIS — E785 Hyperlipidemia, unspecified: Secondary | ICD-10-CM | POA: Diagnosis not present

## 2016-08-29 DIAGNOSIS — M810 Age-related osteoporosis without current pathological fracture: Secondary | ICD-10-CM | POA: Diagnosis not present

## 2016-08-29 DIAGNOSIS — Z79899 Other long term (current) drug therapy: Secondary | ICD-10-CM

## 2016-08-29 DIAGNOSIS — K529 Noninfective gastroenteritis and colitis, unspecified: Secondary | ICD-10-CM | POA: Diagnosis not present

## 2016-08-29 DIAGNOSIS — I2510 Atherosclerotic heart disease of native coronary artery without angina pectoris: Secondary | ICD-10-CM

## 2016-08-29 DIAGNOSIS — H3530 Unspecified macular degeneration: Secondary | ICD-10-CM

## 2016-08-29 DIAGNOSIS — Z7189 Other specified counseling: Secondary | ICD-10-CM

## 2016-08-29 LAB — CBC WITH DIFFERENTIAL/PLATELET
Basophils Absolute: 69 cells/uL (ref 0–200)
Basophils Relative: 1 %
Eosinophils Absolute: 345 cells/uL (ref 15–500)
Eosinophils Relative: 5 %
HCT: 36.7 % (ref 35.0–45.0)
Hemoglobin: 12 g/dL (ref 11.7–15.5)
Lymphocytes Relative: 22 %
Lymphs Abs: 1518 cells/uL (ref 850–3900)
MCH: 31.6 pg (ref 27.0–33.0)
MCHC: 32.7 g/dL (ref 32.0–36.0)
MCV: 96.6 fL (ref 80.0–100.0)
MPV: 9.9 fL (ref 7.5–12.5)
Monocytes Absolute: 828 cells/uL (ref 200–950)
Monocytes Relative: 12 %
Neutro Abs: 4140 cells/uL (ref 1500–7800)
Neutrophils Relative %: 60 %
Platelets: 228 10*3/uL (ref 140–400)
RBC: 3.8 MIL/uL (ref 3.80–5.10)
RDW: 14.4 % (ref 11.0–15.0)
WBC: 6.9 10*3/uL (ref 3.8–10.8)

## 2016-08-29 LAB — LIPID PANEL
Cholesterol: 149 mg/dL (ref 125–200)
HDL: 71 mg/dL (ref >=46)
LDL Cholesterol: 60 mg/dL (ref <130)
Total CHOL/HDL Ratio: 2.1 Ratio (ref <=5.0)
Triglycerides: 89 mg/dL (ref <150)
VLDL: 18 mg/dL (ref <30)

## 2016-08-29 LAB — HEPATIC FUNCTION PANEL
ALT: 11 U/L (ref 6–29)
AST: 15 U/L (ref 10–35)
Albumin: 3.9 g/dL (ref 3.6–5.1)
Alkaline Phosphatase: 54 U/L (ref 33–130)
Bilirubin, Direct: 0.1 mg/dL (ref <=0.2)
Indirect Bilirubin: 0.5 mg/dL (ref 0.2–1.2)
Total Bilirubin: 0.6 mg/dL (ref 0.2–1.2)
Total Protein: 5.8 g/dL — ABNORMAL LOW (ref 6.1–8.1)

## 2016-08-29 LAB — DIGOXIN LEVEL: Digoxin Level: 0.8 ug/L (ref 0.8–2.0)

## 2016-08-29 MED ORDER — DENOSUMAB 60 MG/ML ~~LOC~~ SOLN
60.0000 mg | Freq: Once | SUBCUTANEOUS | Status: AC
  Administered 2016-08-29: 60 mg via SUBCUTANEOUS
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
## 2016-08-30 ENCOUNTER — Other Ambulatory Visit: Payer: Medicare Other

## 2016-09-01 ENCOUNTER — Encounter: Payer: Self-pay | Admitting: Internal Medicine

## 2016-09-03 DIAGNOSIS — R634 Abnormal weight loss: Secondary | ICD-10-CM | POA: Diagnosis not present

## 2016-09-03 DIAGNOSIS — K52832 Lymphocytic colitis: Secondary | ICD-10-CM | POA: Diagnosis not present

## 2016-09-03 DIAGNOSIS — R195 Other fecal abnormalities: Secondary | ICD-10-CM | POA: Diagnosis not present

## 2016-09-03 DIAGNOSIS — K625 Hemorrhage of anus and rectum: Secondary | ICD-10-CM | POA: Diagnosis not present

## 2016-09-11 ENCOUNTER — Telehealth: Payer: Self-pay | Admitting: *Deleted

## 2016-09-11 NOTE — Telephone Encounter (Signed)
Received fax from Dr. Lennie Muckle, Jerelyn Scott (periodontics) @ [phone].  Wanted to know if any precautions for dent treatment while patient is taking Eliquis???  Cecille Rubin stated would need to know specific treatments.

## 2016-09-13 ENCOUNTER — Other Ambulatory Visit: Payer: Self-pay | Admitting: Internal Medicine

## 2016-09-13 MED ORDER — ALPRAZOLAM 0.25 MG PO TABS: ORAL_TABLET | ORAL | 0 refills | Status: DC

## 2016-09-13 NOTE — Telephone Encounter (Signed)
[facility]  

## 2016-09-14 ENCOUNTER — Telehealth: Payer: Self-pay | Admitting: *Deleted

## 2016-09-14 NOTE — Telephone Encounter (Signed)
Faxing instructions to [facility] @ [phone], if pt has teeth extractions to hold eliquis 48 hours prior to any dental extractions than resume.

## 2016-09-25 DIAGNOSIS — H353211 Exudative age-related macular degeneration, right eye, with active choroidal neovascularization: Secondary | ICD-10-CM | POA: Diagnosis not present

## 2016-09-25 DIAGNOSIS — H353114 Nonexudative age-related macular degeneration, right eye, advanced atrophic with subfoveal involvement: Secondary | ICD-10-CM | POA: Diagnosis not present

## 2016-09-25 DIAGNOSIS — H353221 Exudative age-related macular degeneration, left eye, with active choroidal neovascularization: Secondary | ICD-10-CM | POA: Diagnosis not present

## 2016-09-25 DIAGNOSIS — H353231 Exudative age-related macular degeneration, bilateral, with active choroidal neovascularization: Secondary | ICD-10-CM | POA: Diagnosis not present

## 2016-10-08 ENCOUNTER — Other Ambulatory Visit: Payer: Self-pay | Admitting: Internal Medicine

## 2016-10-09 ENCOUNTER — Ambulatory Visit (INDEPENDENT_AMBULATORY_CARE_PROVIDER_SITE_OTHER): Payer: Medicare Other | Admitting: Nurse Practitioner

## 2016-10-09 ENCOUNTER — Encounter: Payer: Self-pay | Admitting: Nurse Practitioner

## 2016-10-09 VITALS — BP 108/66 | HR 66 | Temp 98.2°F | Ht 60.0 in | Wt 146.6 lb

## 2016-10-09 DIAGNOSIS — R300 Dysuria: Secondary | ICD-10-CM | POA: Diagnosis not present

## 2016-10-09 DIAGNOSIS — I2510 Atherosclerotic heart disease of native coronary artery without angina pectoris: Secondary | ICD-10-CM | POA: Diagnosis not present

## 2016-10-09 LAB — POCT URINALYSIS DIPSTICK
BILIRUBIN UA: NEGATIVE
GLUCOSE UA: NEGATIVE
Ketones, UA: NEGATIVE
Nitrite, UA: NEGATIVE
Protein, UA: NEGATIVE
SPEC GRAV UA: 1.01
Urobilinogen, UA: 0.2
pH, UA: 7.5

## 2016-10-09 MED ORDER — NITROFURANTOIN MACROCRYSTAL 100 MG PO CAPS: 100.0000 mg | ORAL_CAPSULE | Freq: Two times a day (BID) | ORAL | 0 refills | Status: AC
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
## 2016-10-09 NOTE — Patient Instructions (Addendum)
Take entire course of antibiotics - do not stop taking just because you feel better.  Increase your water intake.  Practice good hygiene.   Eat yogurt once a day while on the antibiotic.   When the culture comes back we will notify you. We may have to change your antibiotic based on the results.

## 2016-10-10 LAB — URINE CULTURE

## 2016-10-11 ENCOUNTER — Other Ambulatory Visit: Payer: Self-pay | Admitting: *Deleted

## 2016-10-11 MED ORDER — ALPRAZOLAM 0.25 MG PO TABS: ORAL_TABLET | ORAL | 0 refills | Status: DC

## 2016-10-11 NOTE — Telephone Encounter (Signed)
[facility] called requesting a refill

## 2016-10-12 ENCOUNTER — Other Ambulatory Visit: Payer: Self-pay | Admitting: Cardiology

## 2016-10-18 ENCOUNTER — Telehealth: Payer: Self-pay | Admitting: *Deleted

## 2016-10-18 NOTE — Telephone Encounter (Signed)
Patient stated that she was in last week to see Debra Brooks for Dysuria and she called in a Rx to the wrong pharmacy. Patient felt like the symptoms Debra Brooks go away without taking anything but hasn't and would like the medication.  Needs to be called to [facility]. Rx is suppose to be for Nitrofurantoin 100mg  twice daily. Is this ok to go ahead and send to [facility]. Please Advise.

## 2016-10-19 NOTE — Telephone Encounter (Signed)
Patient notified and agreed.  

## 2016-10-19 NOTE — Telephone Encounter (Signed)
LMOM to return call.

## 2016-10-19 NOTE — Telephone Encounter (Signed)
The culture came back and there was no UTI. If she is still having symptoms she will need a follow up appt.

## 2016-10-25 DIAGNOSIS — B351 Tinea unguium: Secondary | ICD-10-CM | POA: Diagnosis not present

## 2016-10-30 DIAGNOSIS — Z961 Presence of intraocular lens: Secondary | ICD-10-CM | POA: Diagnosis not present

## 2016-10-30 DIAGNOSIS — H353221 Exudative age-related macular degeneration, left eye, with active choroidal neovascularization: Secondary | ICD-10-CM | POA: Diagnosis not present

## 2016-10-30 DIAGNOSIS — H35372 Puckering of macula, left eye: Secondary | ICD-10-CM | POA: Diagnosis not present

## 2016-10-30 DIAGNOSIS — H43813 Vitreous degeneration, bilateral: Secondary | ICD-10-CM | POA: Diagnosis not present

## 2016-10-30 DIAGNOSIS — H18423 Band keratopathy, bilateral: Secondary | ICD-10-CM | POA: Diagnosis not present

## 2016-10-30 DIAGNOSIS — H353114 Nonexudative age-related macular degeneration, right eye, advanced atrophic with subfoveal involvement: Secondary | ICD-10-CM | POA: Diagnosis not present

## 2016-11-07 ENCOUNTER — Encounter: Payer: Self-pay | Admitting: Internal Medicine

## 2016-11-07 DIAGNOSIS — R309 Painful micturition, unspecified: Secondary | ICD-10-CM | POA: Diagnosis not present

## 2016-11-07 DIAGNOSIS — Z79899 Other long term (current) drug therapy: Secondary | ICD-10-CM | POA: Diagnosis not present

## 2016-11-07 DIAGNOSIS — R319 Hematuria, unspecified: Secondary | ICD-10-CM | POA: Diagnosis not present

## 2016-11-08 ENCOUNTER — Other Ambulatory Visit: Payer: Self-pay | Admitting: *Deleted

## 2016-11-08 MED ORDER — PHENAZOPYRIDINE HCL 200 MG PO TABS: 200.0000 mg | ORAL_TABLET | Freq: Three times a day (TID) | ORAL | 0 refills | Status: DC | PRN

## 2016-11-09 ENCOUNTER — Telehealth: Payer: Self-pay

## 2016-11-09 MED ORDER — CIPROFLOXACIN HCL 500 MG PO TABS: 500.0000 mg | ORAL_TABLET | Freq: Two times a day (BID) | ORAL | 0 refills | Status: AC

## 2016-11-09 NOTE — Telephone Encounter (Signed)
Spoke with patient and advised I spoke with Sherrie Mustache, NP and per Janett Billow Cipro 500 bid take with food for 7 days. rx sent to pharmacy by e-script

## 2016-11-09 NOTE — Telephone Encounter (Signed)
Message left on clinical intake voicemail:   Patient called to request urine culture results.  Per Karena Addison (Dr.Reed's assistant) awaiting response from Dr.Reed for results that were faxed to our office from [place]. Patient with >100,000 colonies.  I left message on voicemail informing patient we are awaiting a response

## 2016-11-09 NOTE — Telephone Encounter (Signed)
Spoke with patient again and pt states she's desperate and wanted this handled today before the pharmacy closes. Pt states she has a few pills left from the 10/08/16 visit with Janett Billow and wanted to know if it's ok to take them? Pt also has pyridium and wanted to know if she can take more of that? I advised she needs to drink plenty of water and eat yogurt, pt states she's been drinking tea and some water.

## 2016-11-13 DIAGNOSIS — R195 Other fecal abnormalities: Secondary | ICD-10-CM | POA: Diagnosis not present

## 2016-11-13 DIAGNOSIS — K625 Hemorrhage of anus and rectum: Secondary | ICD-10-CM | POA: Diagnosis not present

## 2016-11-13 DIAGNOSIS — K52832 Lymphocytic colitis: Secondary | ICD-10-CM | POA: Diagnosis not present

## 2016-12-07 ENCOUNTER — Other Ambulatory Visit: Payer: Self-pay | Admitting: Internal Medicine

## 2016-12-07 NOTE — Telephone Encounter (Signed)
rx sent to pharmacy by e-script rx called into pharmacy  

## 2016-12-24 DIAGNOSIS — B351 Tinea unguium: Secondary | ICD-10-CM | POA: Diagnosis not present
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
## 2017-01-01 ENCOUNTER — Encounter: Payer: Self-pay | Admitting: Nurse Practitioner

## 2017-01-01 ENCOUNTER — Ambulatory Visit (INDEPENDENT_AMBULATORY_CARE_PROVIDER_SITE_OTHER): Payer: Medicare Other | Admitting: Nurse Practitioner

## 2017-01-01 VITALS — BP 126/72 | HR 68 | Ht 66.0 in | Wt 147.8 lb

## 2017-01-01 DIAGNOSIS — I5032 Chronic diastolic (congestive) heart failure: Secondary | ICD-10-CM | POA: Diagnosis not present

## 2017-01-01 DIAGNOSIS — I10 Essential (primary) hypertension: Secondary | ICD-10-CM

## 2017-01-01 DIAGNOSIS — Z7901 Long term (current) use of anticoagulants: Secondary | ICD-10-CM

## 2017-01-01 NOTE — Patient Instructions (Addendum)
We will be checking the following labs today - BMET, CBC, HPF, digoxin level and Lipids  Medication Instructions:    Continue with your current medicines.     Testing/Procedures To Be Arranged:  N/A  Follow-Up:   See me in 4 months    Other Special Instructions:   N/A    If you need a refill on your cardiac medications before your next appointment, please call your pharmacy.   Call the [place] office at [phone] if you have any questions, problems or concerns.

## 2017-01-02 ENCOUNTER — Other Ambulatory Visit: Payer: Self-pay | Admitting: *Deleted

## 2017-01-02 ENCOUNTER — Ambulatory Visit: Payer: Medicare Other | Admitting: Nurse Practitioner

## 2017-01-02 DIAGNOSIS — I482 Chronic atrial fibrillation, unspecified: Secondary | ICD-10-CM

## 2017-01-02 DIAGNOSIS — R7889 Finding of other specified substances, not normally found in blood: Secondary | ICD-10-CM

## 2017-01-02 LAB — HEPATIC FUNCTION PANEL
ALT: 12 IU/L (ref 0–32)
AST: 18 IU/L (ref 0–40)
Albumin: 4.3 g/dL (ref 3.5–4.7)
Alkaline Phosphatase: 70 IU/L (ref 39–117)
Bilirubin Total: 0.8 mg/dL (ref 0.0–1.2)
Bilirubin, Direct: 0.22 mg/dL (ref 0.00–0.40)
Total Protein: 6.2 g/dL (ref 6.0–8.5)

## 2017-01-02 LAB — CBC
Hematocrit: 38.3 % (ref 34.0–46.6)
Hemoglobin: 12.3 g/dL (ref 11.1–15.9)
MCH: 30.3 pg (ref 26.6–33.0)
MCHC: 32.1 g/dL (ref 31.5–35.7)
MCV: 94 fL (ref 79–97)
Platelets: 253 10*3/uL (ref 150–379)
RBC: 4.06 x10E6/uL (ref 3.77–5.28)
RDW: 14.9 % (ref 12.3–15.4)
WBC: 7.7 10*3/uL (ref 3.4–10.8)

## 2017-01-02 LAB — BASIC METABOLIC PANEL
BUN/Creatinine Ratio: 18 (ref 12–28)
BUN: 18 mg/dL (ref 8–27)
CO2: 29 mmol/L (ref 18–29)
Calcium: 10.1 mg/dL (ref 8.7–10.3)
Chloride: 100 mmol/L (ref 96–106)
Creatinine, Ser: 1.02 mg/dL — ABNORMAL HIGH (ref 0.57–1.00)
GFR calc Af Amer: 56 mL/min/{1.73_m2} — ABNORMAL LOW (ref >59)
GFR calc non Af Amer: 49 mL/min/{1.73_m2} — ABNORMAL LOW (ref >59)
Glucose: 106 mg/dL — ABNORMAL HIGH (ref 65–99)
Potassium: 4.2 mmol/L (ref 3.5–5.2)
Sodium: 144 mmol/L (ref 134–144)

## 2017-01-02 LAB — LIPID PANEL
Chol/HDL Ratio: 2.3 ratio units (ref 0.0–4.4)
Cholesterol, Total: 143 mg/dL (ref 100–199)
HDL: 61 mg/dL (ref >39)
LDL Calculated: 57 mg/dL (ref 0–99)
Triglycerides: 125 mg/dL (ref 0–149)
VLDL Cholesterol Cal: 25 mg/dL (ref 5–40)

## 2017-01-02 LAB — DIGOXIN LEVEL: Digoxin, Serum: 1.1 ng/mL — ABNORMAL HIGH (ref 0.5–0.9)

## 2017-01-03 ENCOUNTER — Other Ambulatory Visit: Payer: Medicare Other | Admitting: *Deleted

## 2017-01-03 DIAGNOSIS — I2510 Atherosclerotic heart disease of native coronary artery without angina pectoris: Secondary | ICD-10-CM | POA: Diagnosis not present

## 2017-01-03 DIAGNOSIS — R7889 Finding of other specified substances, not normally found in blood: Secondary | ICD-10-CM

## 2017-01-03 DIAGNOSIS — I5032 Chronic diastolic (congestive) heart failure: Secondary | ICD-10-CM | POA: Diagnosis not present

## 2017-01-03 DIAGNOSIS — I481 Persistent atrial fibrillation: Secondary | ICD-10-CM | POA: Diagnosis not present

## 2017-01-04 ENCOUNTER — Telehealth: Payer: Self-pay | Admitting: Nurse Practitioner

## 2017-01-04 LAB — DIGOXIN LEVEL: Digoxin, Serum: 0.8 ng/mL (ref 0.5–0.9)

## 2017-01-04 NOTE — Telephone Encounter (Signed)
Mrs. Cieslik is returning a call . Thanks

## 2017-01-04 NOTE — Telephone Encounter (Signed)
I spoke with the pt and made her aware of repeat digoxin level.

## 2017-01-07 ENCOUNTER — Other Ambulatory Visit: Payer: Self-pay | Admitting: Internal Medicine

## 2017-01-08 DIAGNOSIS — H43813 Vitreous degeneration, bilateral: Secondary | ICD-10-CM | POA: Diagnosis not present

## 2017-01-08 DIAGNOSIS — H18423 Band keratopathy, bilateral: Secondary | ICD-10-CM | POA: Diagnosis not present

## 2017-01-08 DIAGNOSIS — H353114 Nonexudative age-related macular degeneration, right eye, advanced atrophic with subfoveal involvement: Secondary | ICD-10-CM | POA: Diagnosis not present

## 2017-01-08 DIAGNOSIS — Z961 Presence of intraocular lens: Secondary | ICD-10-CM | POA: Diagnosis not present

## 2017-01-08 DIAGNOSIS — H35372 Puckering of macula, left eye: Secondary | ICD-10-CM | POA: Diagnosis not present

## 2017-01-08 DIAGNOSIS — H353221 Exudative age-related macular degeneration, left eye, with active choroidal neovascularization: Secondary | ICD-10-CM | POA: Diagnosis not present

## 2017-01-09 ENCOUNTER — Encounter: Payer: Self-pay | Admitting: Internal Medicine

## 2017-01-09 ENCOUNTER — Non-Acute Institutional Stay (INDEPENDENT_AMBULATORY_CARE_PROVIDER_SITE_OTHER): Payer: Medicare Other | Admitting: Internal Medicine

## 2017-01-09 VITALS — BP 130/70 | HR 93 | Temp 98.6°F | Ht 66.0 in | Wt 145.0 lb

## 2017-01-09 DIAGNOSIS — Z0000 Encounter for general adult medical examination without abnormal findings: Secondary | ICD-10-CM
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
## 2017-01-10 ENCOUNTER — Other Ambulatory Visit: Payer: Self-pay

## 2017-01-10 MED ORDER — PREGABALIN 50 MG PO CAPS: ORAL_CAPSULE | ORAL | 5 refills | Status: DC

## 2017-01-25 ENCOUNTER — Other Ambulatory Visit: Payer: Self-pay | Admitting: Internal Medicine

## 2017-02-14 ENCOUNTER — Other Ambulatory Visit: Payer: Self-pay | Admitting: Internal Medicine

## 2017-02-26 ENCOUNTER — Ambulatory Visit: Payer: Self-pay

## 2017-03-01 ENCOUNTER — Other Ambulatory Visit: Payer: Self-pay | Admitting: Internal Medicine

## 2017-03-05 ENCOUNTER — Ambulatory Visit: Payer: Self-pay

## 2017-03-19 ENCOUNTER — Other Ambulatory Visit: Payer: Self-pay | Admitting: Internal Medicine

## 2017-03-19 ENCOUNTER — Other Ambulatory Visit: Payer: Self-pay | Admitting: Nurse Practitioner

## 2017-03-19 NOTE — Telephone Encounter (Signed)
Request received for Eliquis 5mg ; pt is 83 yrs old, 67kg, Crea-1.02 on 01/01/17, last seen by Cecille Rubin NP on 01/01/17. Will send in requested medication to  requested Pharmacy.

## 2017-04-01 DIAGNOSIS — N390 Urinary tract infection, site not specified: Secondary | ICD-10-CM | POA: Diagnosis not present

## 2017-04-01 DIAGNOSIS — R319 Hematuria, unspecified: Secondary | ICD-10-CM | POA: Diagnosis not present

## 2017-04-02 ENCOUNTER — Encounter: Payer: Self-pay | Admitting: Internal Medicine

## 2017-04-02 ENCOUNTER — Non-Acute Institutional Stay (SKILLED_NURSING_FACILITY): Payer: Medicare Other | Admitting: Internal Medicine

## 2017-04-02 DIAGNOSIS — D509 Iron deficiency anemia, unspecified: Secondary | ICD-10-CM | POA: Diagnosis not present

## 2017-04-02 DIAGNOSIS — R300 Dysuria: Secondary | ICD-10-CM

## 2017-04-02 DIAGNOSIS — E861 Hypovolemia: Secondary | ICD-10-CM | POA: Diagnosis not present

## 2017-04-02 DIAGNOSIS — N183 Chronic kidney disease, stage 3 unspecified: Secondary | ICD-10-CM

## 2017-04-02 DIAGNOSIS — R410 Disorientation, unspecified: Secondary | ICD-10-CM

## 2017-04-02 DIAGNOSIS — K52832 Lymphocytic colitis: Secondary | ICD-10-CM | POA: Diagnosis not present

## 2017-04-02 DIAGNOSIS — D649 Anemia, unspecified: Secondary | ICD-10-CM | POA: Diagnosis not present

## 2017-04-02 DIAGNOSIS — F05 Delirium due to known physiological condition: Secondary | ICD-10-CM | POA: Diagnosis not present

## 2017-04-02 LAB — BASIC METABOLIC PANEL
BUN: 27 — AB (ref 4–21)
Creatinine: 1.2 — AB (ref 0.5–1.1)
Glucose: 139
Potassium: 4 (ref 3.4–5.3)
Sodium: 139 (ref 137–147)

## 2017-04-02 LAB — CBC AND DIFFERENTIAL
HCT: 34 — AB (ref 36–46)
Hemoglobin: 11 — AB (ref 12.0–16.0)
Platelets: 176 (ref 150–399)
WBC: 10.2
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
## 2017-04-05 ENCOUNTER — Telehealth: Payer: Self-pay

## 2017-04-05 NOTE — Telephone Encounter (Signed)
Debra Brooks, with [facility] called and left a message they have received message from [facility] about heme positivity and anemia. They wanted to let Dr. Mariea Clonts know they are on stand by if she needed them to see the patient concerning this issue.   I called [facility] and informed them that this patient is in rehab at [facility] and they would need to contact the rehab nurse there.  Debra Brooks (Dr. Magdalene Molly assistant) called [facility], spoke with Debra Brooks to confirm if patient should see GI and she said to hold until she speaks with Dr. Mariea Clonts or [place].

## 2017-04-08 DIAGNOSIS — D649 Anemia, unspecified: Secondary | ICD-10-CM | POA: Diagnosis not present

## 2017-04-08 DIAGNOSIS — E860 Dehydration: Secondary | ICD-10-CM | POA: Diagnosis not present

## 2017-04-08 LAB — BASIC METABOLIC PANEL
BUN: 8 (ref 4–21)
Creatinine: 1 (ref 0.5–1.1)
Glucose: 85
Potassium: 4.7 (ref 3.4–5.3)
Sodium: 142 (ref 137–147)

## 2017-04-08 LAB — CBC AND DIFFERENTIAL
HCT: 32 — AB (ref 36–46)
Hemoglobin: 10.5 — AB (ref 12.0–16.0)
Platelets: 280 (ref 150–399)
WBC: 10

## 2017-04-08 NOTE — Telephone Encounter (Signed)
Ok, I will speak with [place] in rehab on Tuesday and recommend the referral directly.  I know that she does not want invasive treatment if she has a colon cancer--she has made that very clear on multiple occasions and I suspect that is her concern.

## 2017-04-09 NOTE — Telephone Encounter (Signed)
Spoke with patient and advised Dr. Mariea Clonts wanted her to keep her appt with GI, Dr. Cristina Gong and to follow-up with cardiology with any recommendations on lowering the eliquis. Labs are ok per verbal from Dr Mariea Clonts.

## 2017-04-16 DIAGNOSIS — K52832 Lymphocytic colitis: Secondary | ICD-10-CM | POA: Diagnosis not present

## 2017-04-16 DIAGNOSIS — R195 Other fecal abnormalities: Secondary | ICD-10-CM | POA: Diagnosis not present

## 2017-04-16 DIAGNOSIS — D509 Iron deficiency anemia, unspecified: Secondary | ICD-10-CM | POA: Diagnosis not present

## 2017-04-18 ENCOUNTER — Other Ambulatory Visit: Payer: Self-pay | Admitting: Nurse Practitioner

## 2017-04-18 ENCOUNTER — Other Ambulatory Visit: Payer: Self-pay | Admitting: Internal Medicine

## 2017-04-19 ENCOUNTER — Encounter: Payer: Self-pay | Admitting: Nurse Practitioner

## 2017-04-22 ENCOUNTER — Other Ambulatory Visit: Payer: Self-pay | Admitting: *Deleted

## 2017-04-22 MED ORDER — ALPRAZOLAM 0.25 MG PO TABS: ORAL_TABLET | ORAL | 0 refills | Status: DC

## 2017-04-22 NOTE — Telephone Encounter (Signed)
[place] 

## 2017-05-07 ENCOUNTER — Encounter: Payer: Self-pay | Admitting: Nurse Practitioner

## 2017-05-07 ENCOUNTER — Ambulatory Visit (INDEPENDENT_AMBULATORY_CARE_PROVIDER_SITE_OTHER): Payer: Medicare Other | Admitting: Nurse Practitioner

## 2017-05-07 ENCOUNTER — Encounter (INDEPENDENT_AMBULATORY_CARE_PROVIDER_SITE_OTHER): Payer: Self-pay

## 2017-05-07 VITALS — BP 122/58 | HR 68 | Ht 66.0 in | Wt 143.0 lb

## 2017-05-07 DIAGNOSIS — I10 Essential (primary) hypertension: Secondary | ICD-10-CM

## 2017-05-07 DIAGNOSIS — I4819 Other persistent atrial fibrillation: Secondary | ICD-10-CM

## 2017-05-07 DIAGNOSIS — I5032 Chronic diastolic (congestive) heart failure: Secondary | ICD-10-CM

## 2017-05-07 DIAGNOSIS — I481 Persistent atrial fibrillation: Secondary | ICD-10-CM

## 2017-05-07 DIAGNOSIS — Z7901 Long term (current) use of anticoagulants: Secondary | ICD-10-CM

## 2017-05-07 DIAGNOSIS — I2510 Atherosclerotic heart disease of native coronary artery without angina pectoris: Secondary | ICD-10-CM | POA: Diagnosis not present

## 2017-05-07 MED ORDER — FUROSEMIDE 40 MG PO TABS: 60.0000 mg | ORAL_TABLET | Freq: Every day | ORAL | 3 refills | Status: DC

## 2017-05-07 NOTE — Patient Instructions (Addendum)
We will be checking the following labs today - BMET, CBC, BNP and Dig level   Medication Instructions:    Continue with your current medicines. BUT  I am stopping the Lipitor (Atorvastatin)  I am increasing the Lasix to 60 mg a day - this will be a pill and a half - ok to still take an extra one if needed.     Testing/Procedures To Be Arranged:  N/A  Follow-Up:   See me in 4 to 6 weeks with repeat BMET    Other Special Instructions:   N/A    If you need a refill on your cardiac medications before your next appointment, please call your pharmacy.   Call the [place] office at [phone] if you have any questions, problems or concerns.
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
## 2017-05-08 ENCOUNTER — Non-Acute Institutional Stay: Payer: Medicare Other | Admitting: Internal Medicine

## 2017-05-08 ENCOUNTER — Other Ambulatory Visit: Payer: Self-pay | Admitting: *Deleted

## 2017-05-08 ENCOUNTER — Encounter: Payer: Self-pay | Admitting: Internal Medicine

## 2017-05-08 VITALS — BP 120/60 | HR 68 | Temp 98.2°F | Wt 143.0 lb

## 2017-05-08 DIAGNOSIS — I2510 Atherosclerotic heart disease of native coronary artery without angina pectoris: Secondary | ICD-10-CM

## 2017-05-08 DIAGNOSIS — I482 Chronic atrial fibrillation, unspecified: Secondary | ICD-10-CM

## 2017-05-08 DIAGNOSIS — H353114 Nonexudative age-related macular degeneration, right eye, advanced atrophic with subfoveal involvement: Secondary | ICD-10-CM | POA: Diagnosis not present

## 2017-05-08 DIAGNOSIS — K52832 Lymphocytic colitis: Secondary | ICD-10-CM | POA: Diagnosis not present

## 2017-05-08 DIAGNOSIS — N183 Chronic kidney disease, stage 3 unspecified: Secondary | ICD-10-CM

## 2017-05-08 DIAGNOSIS — H353221 Exudative age-related macular degeneration, left eye, with active choroidal neovascularization: Secondary | ICD-10-CM | POA: Diagnosis not present

## 2017-05-08 DIAGNOSIS — I509 Heart failure, unspecified: Secondary | ICD-10-CM

## 2017-05-08 DIAGNOSIS — M810 Age-related osteoporosis without current pathological fracture: Secondary | ICD-10-CM | POA: Diagnosis not present

## 2017-05-08 LAB — CBC
Hematocrit: 38.5 % (ref 34.0–46.6)
Hemoglobin: 12.3 g/dL (ref 11.1–15.9)
MCH: 31.4 pg (ref 26.6–33.0)
MCHC: 31.9 g/dL (ref 31.5–35.7)
MCV: 98 fL — ABNORMAL HIGH (ref 79–97)
Platelets: 264 10*3/uL (ref 150–379)
RBC: 3.92 x10E6/uL (ref 3.77–5.28)
RDW: 14.8 % (ref 12.3–15.4)
WBC: 8.3 10*3/uL (ref 3.4–10.8)

## 2017-05-08 LAB — DIGOXIN LEVEL: Digoxin, Serum: 1.1 ng/mL — ABNORMAL HIGH (ref 0.5–0.9)

## 2017-05-08 LAB — BASIC METABOLIC PANEL
BUN/Creatinine Ratio: 18 (ref 12–28)
BUN: 20 mg/dL (ref 10–36)
CO2: 27 mmol/L (ref 20–29)
Calcium: 10.9 mg/dL — ABNORMAL HIGH (ref 8.7–10.3)
Chloride: 99 mmol/L (ref 96–106)
Creatinine, Ser: 1.1 mg/dL — ABNORMAL HIGH (ref 0.57–1.00)
GFR calc Af Amer: 51 mL/min/{1.73_m2} — ABNORMAL LOW (ref >59)
GFR calc non Af Amer: 44 mL/min/{1.73_m2} — ABNORMAL LOW (ref >59)
Glucose: 128 mg/dL — ABNORMAL HIGH (ref 65–99)
Potassium: 3.9 mmol/L (ref 3.5–5.2)
Sodium: 143 mmol/L (ref 134–144)

## 2017-05-08 LAB — PRO B NATRIURETIC PEPTIDE: NT-Pro BNP: 1487 pg/mL — ABNORMAL HIGH (ref 0–738)

## 2017-05-08 MED ORDER — DIGOXIN 125 MCG PO TABS: 0.0625 mg | ORAL_TABLET | Freq: Every day | ORAL | 6 refills | Status: DC

## 2017-05-14 ENCOUNTER — Telehealth: Payer: Self-pay | Admitting: Nurse Practitioner

## 2017-05-14 NOTE — Telephone Encounter (Signed)
Pt called in today to state needs a new script for (60 mg) of lasix.  Stated Cecille Rubin already sent in on 7/10.

## 2017-05-14 NOTE — Telephone Encounter (Signed)
New message    Pt is calling about a new prescription for furosemide. She is asking for a call back from [facility].

## 2017-05-16 ENCOUNTER — Encounter: Payer: Self-pay | Admitting: Internal Medicine
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
## 2017-05-17 ENCOUNTER — Other Ambulatory Visit: Payer: Self-pay | Admitting: Internal Medicine

## 2017-05-17 MED ORDER — FUROSEMIDE 40 MG PO TABS: 60.0000 mg | ORAL_TABLET | Freq: Every day | ORAL | 3 refills | Status: DC

## 2017-05-17 MED ORDER — DIGOXIN 125 MCG PO TABS: 0.0625 mg | ORAL_TABLET | Freq: Every day | ORAL | 6 refills | Status: DC

## 2017-05-17 NOTE — Telephone Encounter (Signed)
[place] calling requesting a refill on furosemide and digoxin, medications were on print and was not sent to the pharmacy. I resent the medications in the the pt's pharmacy as requested. Confirmation received.

## 2017-05-17 NOTE — Telephone Encounter (Signed)
Last TSH greater than 1 year ago, no pending appointment. Please advise   Last OV 05/08/17

## 2017-05-20 ENCOUNTER — Other Ambulatory Visit: Payer: Self-pay | Admitting: Nurse Practitioner

## 2017-05-22 ENCOUNTER — Other Ambulatory Visit: Payer: Self-pay | Admitting: Nurse Practitioner

## 2017-06-11 DIAGNOSIS — H353114 Nonexudative age-related macular degeneration, right eye, advanced atrophic with subfoveal involvement: Secondary | ICD-10-CM | POA: Diagnosis not present

## 2017-06-11 DIAGNOSIS — H35372 Puckering of macula, left eye: Secondary | ICD-10-CM | POA: Diagnosis not present

## 2017-06-11 DIAGNOSIS — Z961 Presence of intraocular lens: Secondary | ICD-10-CM | POA: Diagnosis not present

## 2017-06-11 DIAGNOSIS — H43813 Vitreous degeneration, bilateral: Secondary | ICD-10-CM | POA: Diagnosis not present

## 2017-06-11 DIAGNOSIS — H18423 Band keratopathy, bilateral: Secondary | ICD-10-CM | POA: Diagnosis not present

## 2017-06-11 DIAGNOSIS — H353221 Exudative age-related macular degeneration, left eye, with active choroidal neovascularization: Secondary | ICD-10-CM | POA: Diagnosis not present

## 2017-06-17 ENCOUNTER — Other Ambulatory Visit: Payer: Self-pay | Admitting: Internal Medicine
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
## 2017-06-18 ENCOUNTER — Encounter: Payer: Self-pay | Admitting: Nurse Practitioner

## 2017-06-18 ENCOUNTER — Encounter (INDEPENDENT_AMBULATORY_CARE_PROVIDER_SITE_OTHER): Payer: Self-pay

## 2017-06-18 ENCOUNTER — Ambulatory Visit (INDEPENDENT_AMBULATORY_CARE_PROVIDER_SITE_OTHER): Payer: Medicare Other | Admitting: Nurse Practitioner

## 2017-06-18 VITALS — BP 140/60 | HR 77 | Ht 66.0 in | Wt 144.8 lb

## 2017-06-18 DIAGNOSIS — I10 Essential (primary) hypertension: Secondary | ICD-10-CM | POA: Diagnosis not present

## 2017-06-18 DIAGNOSIS — Z7901 Long term (current) use of anticoagulants: Secondary | ICD-10-CM

## 2017-06-18 DIAGNOSIS — I2510 Atherosclerotic heart disease of native coronary artery without angina pectoris: Secondary | ICD-10-CM

## 2017-06-18 DIAGNOSIS — I5032 Chronic diastolic (congestive) heart failure: Secondary | ICD-10-CM | POA: Diagnosis not present

## 2017-06-18 NOTE — Patient Instructions (Addendum)
We will be checking the following labs today - BMET & Digoxin level   Medication Instructions:    Continue with your current medicines.     Testing/Procedures To Be Arranged:  N/A  Follow-Up:   See me in 3 to 4 months.     Other Special Instructions:   N/A    If you need a refill on your cardiac medications before your next appointment, please call your pharmacy.   Call the [facility] office at [phone] if you have any questions, problems or concerns.

## 2017-06-19 LAB — DIGOXIN LEVEL: Digoxin, Serum: 0.4 ng/mL — ABNORMAL LOW (ref 0.5–0.9)

## 2017-06-19 LAB — BASIC METABOLIC PANEL
BUN/Creatinine Ratio: 17 (ref 12–28)
BUN: 18 mg/dL (ref 10–36)
CO2: 25 mmol/L (ref 20–29)
Calcium: 9.9 mg/dL (ref 8.7–10.3)
Chloride: 100 mmol/L (ref 96–106)
Creatinine, Ser: 1.09 mg/dL — ABNORMAL HIGH (ref 0.57–1.00)
GFR calc Af Amer: 52 mL/min/{1.73_m2} — ABNORMAL LOW (ref >59)
GFR calc non Af Amer: 45 mL/min/{1.73_m2} — ABNORMAL LOW (ref >59)
Glucose: 91 mg/dL (ref 65–99)
Potassium: 3.8 mmol/L (ref 3.5–5.2)
Sodium: 143 mmol/L (ref 134–144)

## 2017-07-18 ENCOUNTER — Other Ambulatory Visit: Payer: Self-pay | Admitting: Internal Medicine

## 2017-07-18 NOTE — Telephone Encounter (Signed)
Called in lyrica

## 2017-08-20 ENCOUNTER — Other Ambulatory Visit: Payer: Self-pay | Admitting: *Deleted

## 2017-08-20 MED ORDER — ALPRAZOLAM 0.25 MG PO TABS: ORAL_TABLET | ORAL | 0 refills | Status: DC

## 2017-08-20 NOTE — Telephone Encounter (Signed)
[facility]  

## 2017-08-22 DIAGNOSIS — Z23 Encounter for immunization: Secondary | ICD-10-CM | POA: Diagnosis not present

## 2017-08-31 DIAGNOSIS — J040 Acute laryngitis: Secondary | ICD-10-CM | POA: Diagnosis not present

## 2017-09-04 ENCOUNTER — Non-Acute Institutional Stay: Payer: Medicare Other | Admitting: Internal Medicine

## 2017-09-04 ENCOUNTER — Encounter: Payer: Self-pay | Admitting: Internal Medicine

## 2017-09-04 VITALS — BP 120/60 | HR 88 | Temp 98.6°F | Wt 147.0 lb

## 2017-09-04 DIAGNOSIS — J410 Simple chronic bronchitis: Secondary | ICD-10-CM | POA: Diagnosis not present

## 2017-09-04 DIAGNOSIS — K52832 Lymphocytic colitis: Secondary | ICD-10-CM | POA: Diagnosis not present

## 2017-09-04 DIAGNOSIS — M810 Age-related osteoporosis without current pathological fracture: Secondary | ICD-10-CM | POA: Diagnosis not present

## 2017-09-04 DIAGNOSIS — I2510 Atherosclerotic heart disease of native coronary artery without angina pectoris: Secondary | ICD-10-CM

## 2017-09-04 DIAGNOSIS — I4891 Unspecified atrial fibrillation: Secondary | ICD-10-CM

## 2017-09-04 DIAGNOSIS — F325 Major depressive disorder, single episode, in full remission: Secondary | ICD-10-CM | POA: Diagnosis not present

## 2017-09-04 DIAGNOSIS — J440 Chronic obstructive pulmonary disease with acute lower respiratory infection: Secondary | ICD-10-CM | POA: Insufficient documentation

## 2017-09-04 DIAGNOSIS — D6869 Other thrombophilia: Secondary | ICD-10-CM | POA: Diagnosis not present

## 2017-09-04 DIAGNOSIS — J040 Acute laryngitis: Secondary | ICD-10-CM | POA: Diagnosis not present

## 2017-09-04 DIAGNOSIS — J209 Acute bronchitis, unspecified: Secondary | ICD-10-CM | POA: Insufficient documentation

## 2017-09-04 DIAGNOSIS — H353221 Exudative age-related macular degeneration, left eye, with active choroidal neovascularization: Secondary | ICD-10-CM

## 2017-09-04 DIAGNOSIS — I482 Chronic atrial fibrillation, unspecified: Secondary | ICD-10-CM

## 2017-09-04 HISTORY — DX: Unspecified atrial fibrillation: I48.91

## 2017-09-04 HISTORY — DX: Other thrombophilia: D68.69
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
## 2017-09-12 ENCOUNTER — Other Ambulatory Visit: Payer: Self-pay | Admitting: Internal Medicine

## 2017-09-16 ENCOUNTER — Other Ambulatory Visit: Payer: Self-pay | Admitting: Internal Medicine

## 2017-09-24 ENCOUNTER — Ambulatory Visit: Payer: Self-pay | Admitting: Nurse Practitioner

## 2017-10-14 ENCOUNTER — Other Ambulatory Visit: Payer: Self-pay | Admitting: Nurse Practitioner

## 2017-10-16 ENCOUNTER — Telehealth: Payer: Self-pay

## 2017-10-16 MED ORDER — DOXYCYCLINE HYCLATE 100 MG PO CAPS: 100.0000 mg | ORAL_CAPSULE | Freq: Two times a day (BID) | ORAL | 0 refills | Status: AC

## 2017-10-16 NOTE — Telephone Encounter (Signed)
Spoke with kim again while she was in patient's home and pt spoke with someone in our office who said Dr. Mariea Clonts was going to work pt in today at [facility]. I advised that we don't have any openings or anywhere to work pt in. Pt has agreed to come to [facility] to be seen by NP jessica eubanks at 11:30 10/17/2017. appt scheduled.

## 2017-10-16 NOTE — Telephone Encounter (Signed)
Patient was evaluated by clinic nurse at [facility], patient states she was told per Dr.Reed/assistant to go to Urgent Care due to no appointments. Patients states I am 83 years old and I am sick, patient would like for Dr.Reed to see her or prescribe something for her.  Patient states "If I have to go to Urgent Care then I don't need a doctor that comes here."    Patient called complaining of cold/upper respiratory/sinus symptoms  1. Fever? 100.4 2. Chills? Yes 3. Myalgias? Yes  4. How long have you had your symptoms? Started in the middle of night 5. Are you coughing up mucus and if yes any discoloration? Yes, yellow 6. What have you done for your symptoms thus far? Patient took a tylenol last night and this am 7. Have you been around anyone sick? No  Patient confirmed pharmacy on file is correct, patient states if something can be sent in before 1 pm [facility] will deliver to her.   I will forward your response to your provider and call you with further instructions

## 2017-10-16 NOTE — Telephone Encounter (Signed)
Spoke with kim independent nurse at [facility] and advised that rx was sent to [place], kim will call the patient and let her know. I asked that the rx be delivered tonight.

## 2017-10-16 NOTE — Telephone Encounter (Signed)
I called Dee (Dr.Reed's assistant) to inform her of patient's call and request

## 2017-10-16 NOTE — Telephone Encounter (Signed)
Spoke with kim independent nurse at [facility] and she suggested pt go to urgent care due to no appts today at [facility]. Pt was on the phone and kim and I was talking and kim was going to arrange security to take pt to urgent care.

## 2017-10-16 NOTE — Telephone Encounter (Signed)
I'm very sorry that the schedule was booked.  Due to her fever and multiple chronic conditions, let's begin her on doxycycline 100mg  po bid for 10 days with food.  She should also eat yogurt while on the antibiotics, drink plenty of fluids.  I still want her to see Janett Billow tomorrow so she can make sure she does not need an xray of her chest or any other treatments.

## 2017-10-17 ENCOUNTER — Ambulatory Visit
Admission: RE | Admit: 2017-10-17 | Discharge: 2017-10-17 | Disposition: A | Discharge status: Home / Self Care | Payer: Medicare Other | Source: Ambulatory Visit | Attending: Nurse Practitioner | Admitting: Nurse Practitioner

## 2017-10-17 ENCOUNTER — Ambulatory Visit (INDEPENDENT_AMBULATORY_CARE_PROVIDER_SITE_OTHER): Payer: Medicare Other | Admitting: Nurse Practitioner

## 2017-10-17 ENCOUNTER — Other Ambulatory Visit: Payer: Self-pay | Admitting: Internal Medicine

## 2017-10-17 ENCOUNTER — Encounter: Payer: Self-pay | Admitting: Nurse Practitioner

## 2017-10-17 VITALS — BP 120/72 | HR 83 | Temp 98.3°F | Resp 17 | Ht 66.0 in | Wt 147.0 lb

## 2017-10-17 DIAGNOSIS — I2510 Atherosclerotic heart disease of native coronary artery without angina pectoris: Secondary | ICD-10-CM | POA: Diagnosis not present

## 2017-10-17 DIAGNOSIS — J40 Bronchitis, not specified as acute or chronic: Secondary | ICD-10-CM

## 2017-10-17 DIAGNOSIS — R05 Cough: Secondary | ICD-10-CM | POA: Diagnosis not present

## 2017-10-17 LAB — BASIC METABOLIC PANEL WITH GFR
BUN / CREAT RATIO: 24 (calc) — AB (ref 6–22)
BUN: 25 mg/dL (ref 7–25)
CO2: 30 mmol/L (ref 20–32)
CREATININE: 1.04 mg/dL — AB (ref 0.60–0.88)
Calcium: 9.4 mg/dL (ref 8.6–10.4)
Chloride: 102 mmol/L (ref 98–110)
GFR, EST NON AFRICAN AMERICAN: 47 mL/min/{1.73_m2} — AB (ref >=60)
GFR, Est African American: 55 mL/min/{1.73_m2} — ABNORMAL LOW (ref >=60)
GLUCOSE: 98 mg/dL (ref 65–139)
Potassium: 4 mmol/L (ref 3.5–5.3)
SODIUM: 140 mmol/L (ref 135–146)

## 2017-10-17 LAB — CBC WITH DIFFERENTIAL/PLATELET
BASOS ABS: 39 {cells}/uL (ref 0–200)
Basophils Relative: 1 %
EOS ABS: 109 {cells}/uL (ref 15–500)
Eosinophils Relative: 2.8 %
HCT: 30.1 % — ABNORMAL LOW (ref 35.0–45.0)
HEMOGLOBIN: 9.8 g/dL — AB (ref 11.7–15.5)
Lymphs Abs: 550 cells/uL — ABNORMAL LOW (ref 850–3900)
MCH: 29.3 pg (ref 27.0–33.0)
MCHC: 32.6 g/dL (ref 32.0–36.0)
MCV: 90.1 fL (ref 80.0–100.0)
MONOS PCT: 17.2 %
MPV: 10.7 fL (ref 7.5–12.5)
NEUTROS ABS: 2531 {cells}/uL (ref 1500–7800)
Neutrophils Relative %: 64.9 %
Platelets: 185 10*3/uL (ref 140–400)
RBC: 3.34 10*6/uL — ABNORMAL LOW (ref 3.80–5.10)
RDW: 13.3 % (ref 11.0–15.0)
Total Lymphocyte: 14.1 %
WBC mixed population: 671 cells/uL (ref 200–950)
WBC: 3.9 10*3/uL (ref 3.8–10.8)

## 2017-10-17 MED ORDER — LEVALBUTEROL TARTRATE 45 MCG/ACT IN AERO: 1.0000 | INHALATION_SPRAY | RESPIRATORY_TRACT | 12 refills | Status: DC | PRN

## 2017-10-17 NOTE — Patient Instructions (Addendum)
neti pot twice daily Plain nasal saline spray throughout the day as needed May use tylenol 325 mg 2 tablets every 6 hours as needed aches and pains or sore throat humidifier in the home to help with the dry air Mucinex DM by mouth twice daily as needed for cough and congestion with full glass of water x 7 days Keep well hydrated  To use benzonatate 100 mg by mouth every 8 hours as needed cough Avoid forcefully blowing nose To use levalbuterol 1 puff every 4 hours as needed for shortness of breath/wheezing
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
## 2017-10-22 DIAGNOSIS — R062 Wheezing: Secondary | ICD-10-CM | POA: Diagnosis not present

## 2017-10-24 ENCOUNTER — Encounter: Payer: Self-pay | Admitting: Adult Health

## 2017-10-24 ENCOUNTER — Non-Acute Institutional Stay (SKILLED_NURSING_FACILITY): Payer: Medicare Other | Admitting: Adult Health

## 2017-10-24 DIAGNOSIS — S22000A Wedge compression fracture of unspecified thoracic vertebra, initial encounter for closed fracture: Secondary | ICD-10-CM | POA: Diagnosis not present

## 2017-10-24 DIAGNOSIS — J209 Acute bronchitis, unspecified: Secondary | ICD-10-CM | POA: Diagnosis not present

## 2017-10-24 NOTE — Assessment & Plan Note (Signed)
Continue scheduled tylenol 650 mg BID Heat pad prn If no improvement report to [facility] She is not interested in medications for osteoporosis (noted allergy to prolia in epic)
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
## 2017-10-28 DIAGNOSIS — R05 Cough: Secondary | ICD-10-CM | POA: Diagnosis not present

## 2017-10-28 DIAGNOSIS — D649 Anemia, unspecified: Secondary | ICD-10-CM | POA: Diagnosis not present

## 2017-10-28 DIAGNOSIS — R042 Hemoptysis: Secondary | ICD-10-CM | POA: Diagnosis not present

## 2017-10-29 DIAGNOSIS — R5381 Other malaise: Secondary | ICD-10-CM | POA: Diagnosis not present

## 2017-10-29 DIAGNOSIS — R0602 Shortness of breath: Secondary | ICD-10-CM | POA: Diagnosis not present

## 2017-10-29 DIAGNOSIS — D649 Anemia, unspecified: Secondary | ICD-10-CM | POA: Diagnosis not present

## 2017-10-30 ENCOUNTER — Emergency Department (HOSPITAL_COMMUNITY): Payer: Medicare Other

## 2017-10-30 ENCOUNTER — Non-Acute Institutional Stay (SKILLED_NURSING_FACILITY): Payer: Medicare Other | Admitting: Internal Medicine

## 2017-10-30 ENCOUNTER — Observation Stay (HOSPITAL_COMMUNITY)
Admission: EM | Admit: 2017-10-30 | Discharge: 2017-11-01 | Disposition: A | Discharge status: Home / Self Care | Payer: Medicare Other | Attending: Infectious Disease | Admitting: Infectious Disease

## 2017-10-30 ENCOUNTER — Encounter: Payer: Self-pay | Admitting: Internal Medicine

## 2017-10-30 ENCOUNTER — Other Ambulatory Visit: Payer: Self-pay

## 2017-10-30 DIAGNOSIS — S22000A Wedge compression fracture of unspecified thoracic vertebra, initial encounter for closed fracture: Secondary | ICD-10-CM

## 2017-10-30 DIAGNOSIS — Z923 Personal history of irradiation: Secondary | ICD-10-CM | POA: Diagnosis not present

## 2017-10-30 DIAGNOSIS — G459 Transient cerebral ischemic attack, unspecified: Principal | ICD-10-CM | POA: Insufficient documentation

## 2017-10-30 DIAGNOSIS — N183 Chronic kidney disease, stage 3 unspecified: Secondary | ICD-10-CM

## 2017-10-30 DIAGNOSIS — Z853 Personal history of malignant neoplasm of breast: Secondary | ICD-10-CM | POA: Insufficient documentation

## 2017-10-30 DIAGNOSIS — Z9221 Personal history of antineoplastic chemotherapy: Secondary | ICD-10-CM | POA: Insufficient documentation

## 2017-10-30 DIAGNOSIS — Z7901 Long term (current) use of anticoagulants: Secondary | ICD-10-CM | POA: Insufficient documentation

## 2017-10-30 DIAGNOSIS — D6869 Other thrombophilia: Secondary | ICD-10-CM

## 2017-10-30 DIAGNOSIS — Z85038 Personal history of other malignant neoplasm of large intestine: Secondary | ICD-10-CM | POA: Insufficient documentation

## 2017-10-30 DIAGNOSIS — R471 Dysarthria and anarthria: Secondary | ICD-10-CM | POA: Diagnosis not present

## 2017-10-30 DIAGNOSIS — I5042 Chronic combined systolic (congestive) and diastolic (congestive) heart failure: Secondary | ICD-10-CM | POA: Insufficient documentation

## 2017-10-30 DIAGNOSIS — Z87891 Personal history of nicotine dependence: Secondary | ICD-10-CM | POA: Insufficient documentation

## 2017-10-30 DIAGNOSIS — R479 Unspecified speech disturbances: Secondary | ICD-10-CM | POA: Diagnosis not present

## 2017-10-30 DIAGNOSIS — M810 Age-related osteoporosis without current pathological fracture: Secondary | ICD-10-CM

## 2017-10-30 DIAGNOSIS — I482 Chronic atrial fibrillation, unspecified: Secondary | ICD-10-CM

## 2017-10-30 DIAGNOSIS — I2510 Atherosclerotic heart disease of native coronary artery without angina pectoris: Secondary | ICD-10-CM | POA: Insufficient documentation

## 2017-10-30 DIAGNOSIS — D649 Anemia, unspecified: Secondary | ICD-10-CM | POA: Diagnosis not present

## 2017-10-30 DIAGNOSIS — E039 Hypothyroidism, unspecified: Secondary | ICD-10-CM | POA: Diagnosis present

## 2017-10-30 DIAGNOSIS — D509 Iron deficiency anemia, unspecified: Secondary | ICD-10-CM | POA: Diagnosis present

## 2017-10-30 DIAGNOSIS — N289 Disorder of kidney and ureter, unspecified: Secondary | ICD-10-CM | POA: Diagnosis not present

## 2017-10-30 DIAGNOSIS — J181 Lobar pneumonia, unspecified organism: Secondary | ICD-10-CM | POA: Diagnosis not present

## 2017-10-30 DIAGNOSIS — I10 Essential (primary) hypertension: Secondary | ICD-10-CM | POA: Diagnosis present

## 2017-10-30 DIAGNOSIS — R29818 Other symptoms and signs involving the nervous system: Secondary | ICD-10-CM | POA: Diagnosis not present

## 2017-10-30 DIAGNOSIS — J410 Simple chronic bronchitis: Secondary | ICD-10-CM | POA: Diagnosis not present

## 2017-10-30 DIAGNOSIS — R000 Tachycardia, unspecified: Secondary | ICD-10-CM | POA: Diagnosis not present

## 2017-10-30 DIAGNOSIS — I4891 Unspecified atrial fibrillation: Secondary | ICD-10-CM

## 2017-10-30 DIAGNOSIS — J40 Bronchitis, not specified as acute or chronic: Secondary | ICD-10-CM

## 2017-10-30 DIAGNOSIS — Z79899 Other long term (current) drug therapy: Secondary | ICD-10-CM | POA: Insufficient documentation

## 2017-10-30 DIAGNOSIS — I69320 Aphasia following cerebral infarction: Secondary | ICD-10-CM | POA: Diagnosis not present

## 2017-10-30 DIAGNOSIS — F419 Anxiety disorder, unspecified: Secondary | ICD-10-CM | POA: Diagnosis present

## 2017-10-30 DIAGNOSIS — I130 Hypertensive heart and chronic kidney disease with heart failure and stage 1 through stage 4 chronic kidney disease, or unspecified chronic kidney disease: Secondary | ICD-10-CM | POA: Diagnosis not present

## 2017-10-30 DIAGNOSIS — N189 Chronic kidney disease, unspecified: Secondary | ICD-10-CM | POA: Diagnosis not present

## 2017-10-30 DIAGNOSIS — F325 Major depressive disorder, single episode, in full remission: Secondary | ICD-10-CM | POA: Diagnosis present

## 2017-10-30 LAB — CBC
HCT: 35.3 % — ABNORMAL LOW (ref 36.0–46.0)
Hemoglobin: 10.7 g/dL — ABNORMAL LOW (ref 12.0–15.0)
MCH: 27.8 pg (ref 26.0–34.0)
MCHC: 30.3 g/dL (ref 30.0–36.0)
MCV: 91.7 fL (ref 78.0–100.0)
PLATELETS: 338 10*3/uL (ref 150–400)
RBC: 3.85 MIL/uL — ABNORMAL LOW (ref 3.87–5.11)
RDW: 15.4 % (ref 11.5–15.5)
WBC: 8.8 10*3/uL (ref 4.0–10.5)

## 2017-10-30 LAB — DIFFERENTIAL
BASOS PCT: 1 %
Basophils Absolute: 0.1 10*3/uL (ref 0.0–0.1)
EOS PCT: 3 %
Eosinophils Absolute: 0.3 10*3/uL (ref 0.0–0.7)
Lymphocytes Relative: 12 %
Lymphs Abs: 1.1 10*3/uL (ref 0.7–4.0)
MONO ABS: 0.8 10*3/uL (ref 0.1–1.0)
Monocytes Relative: 9 %
NEUTROS ABS: 6.6 10*3/uL (ref 1.7–7.7)
NEUTROS PCT: 75 %

## 2017-10-30 LAB — CBG MONITORING, ED: Glucose-Capillary: 89 mg/dL (ref 65–99)

## 2017-10-30 LAB — PROTIME-INR
INR: 1.23
Prothrombin Time: 15.4 seconds — ABNORMAL HIGH (ref 11.4–15.2)

## 2017-10-30 LAB — APTT: aPTT: 30 seconds (ref 24–36)

## 2017-10-30 LAB — I-STAT TROPONIN, ED: TROPONIN I, POC: 0.03 ng/mL (ref 0.00–0.08)
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
## 2017-10-30 NOTE — ED Notes (Signed)
Cheral Marker, MD Neurologist at bedside.
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
## 2017-10-30 NOTE — ED Notes (Signed)
Pt CBG 89. RN Mario notified.

## 2017-10-30 NOTE — ED Triage Notes (Signed)
Pt BIB EMS from [facility]. Pt c/o her heart "racing" after taking abx this evening. Dx with pneumonia on Sunday this week. Also c/o dry mouth, generalized tingling. HR 120s per EMS upon initial presentation, but reduced to 90s en route. Denies CP, incr'd SOB, weakness. Hx of A-fib.

## 2017-10-31 ENCOUNTER — Emergency Department (HOSPITAL_COMMUNITY): Payer: Medicare Other

## 2017-10-31 ENCOUNTER — Other Ambulatory Visit: Payer: Self-pay

## 2017-10-31 ENCOUNTER — Telehealth: Payer: Self-pay | Admitting: Nurse Practitioner

## 2017-10-31 ENCOUNTER — Encounter (HOSPITAL_COMMUNITY): Payer: Self-pay | Admitting: Emergency Medicine

## 2017-10-31 ENCOUNTER — Observation Stay (HOSPITAL_COMMUNITY): Payer: Medicare Other

## 2017-10-31 DIAGNOSIS — J40 Bronchitis, not specified as acute or chronic: Secondary | ICD-10-CM | POA: Diagnosis not present

## 2017-10-31 DIAGNOSIS — R000 Tachycardia, unspecified: Secondary | ICD-10-CM | POA: Diagnosis not present

## 2017-10-31 DIAGNOSIS — I5033 Acute on chronic diastolic (congestive) heart failure: Secondary | ICD-10-CM | POA: Diagnosis not present

## 2017-10-31 DIAGNOSIS — D509 Iron deficiency anemia, unspecified: Secondary | ICD-10-CM | POA: Diagnosis not present

## 2017-10-31 DIAGNOSIS — N183 Chronic kidney disease, stage 3 (moderate): Secondary | ICD-10-CM | POA: Diagnosis not present

## 2017-10-31 DIAGNOSIS — I10 Essential (primary) hypertension: Secondary | ICD-10-CM | POA: Diagnosis not present

## 2017-10-31 DIAGNOSIS — N289 Disorder of kidney and ureter, unspecified: Secondary | ICD-10-CM

## 2017-10-31 DIAGNOSIS — R471 Dysarthria and anarthria: Secondary | ICD-10-CM | POA: Diagnosis not present

## 2017-10-31 DIAGNOSIS — D649 Anemia, unspecified: Secondary | ICD-10-CM

## 2017-10-31 DIAGNOSIS — J439 Emphysema, unspecified: Secondary | ICD-10-CM | POA: Diagnosis not present

## 2017-10-31 DIAGNOSIS — E039 Hypothyroidism, unspecified: Secondary | ICD-10-CM | POA: Diagnosis not present

## 2017-10-31 DIAGNOSIS — F419 Anxiety disorder, unspecified: Secondary | ICD-10-CM | POA: Diagnosis not present

## 2017-10-31 DIAGNOSIS — H539 Unspecified visual disturbance: Secondary | ICD-10-CM

## 2017-10-31 DIAGNOSIS — G459 Transient cerebral ischemic attack, unspecified: Secondary | ICD-10-CM | POA: Diagnosis not present

## 2017-10-31 DIAGNOSIS — F325 Major depressive disorder, single episode, in full remission: Secondary | ICD-10-CM | POA: Diagnosis not present

## 2017-10-31 DIAGNOSIS — I482 Chronic atrial fibrillation: Secondary | ICD-10-CM

## 2017-10-31 LAB — RESPIRATORY PANEL BY PCR
Adenovirus: NOT DETECTED
BORDETELLA PERTUSSIS-RVPCR: NOT DETECTED
CORONAVIRUS OC43-RVPPCR: NOT DETECTED
Chlamydophila pneumoniae: NOT DETECTED
Coronavirus 229E: NOT DETECTED
Coronavirus HKU1: NOT DETECTED
Coronavirus NL63: NOT DETECTED
INFLUENZA A-RVPPCR: NOT DETECTED
INFLUENZA B-RVPPCR: NOT DETECTED
Metapneumovirus: NOT DETECTED
Mycoplasma pneumoniae: NOT DETECTED
PARAINFLUENZA VIRUS 1-RVPPCR: NOT DETECTED
PARAINFLUENZA VIRUS 4-RVPPCR: NOT DETECTED
Parainfluenza Virus 2: NOT DETECTED
Parainfluenza Virus 3: NOT DETECTED
RESPIRATORY SYNCYTIAL VIRUS-RVPPCR: NOT DETECTED
Rhinovirus / Enterovirus: NOT DETECTED

## 2017-10-31 LAB — CBC WITH DIFFERENTIAL/PLATELET
BASOS ABS: 0.1 10*3/uL (ref 0.0–0.1)
BASOS PCT: 1 %
EOS ABS: 0.3 10*3/uL (ref 0.0–0.7)
Eosinophils Relative: 4 %
HCT: 32.4 % — ABNORMAL LOW (ref 36.0–46.0)
Hemoglobin: 9.8 g/dL — ABNORMAL LOW (ref 12.0–15.0)
Lymphocytes Relative: 18 %
Lymphs Abs: 1.6 10*3/uL (ref 0.7–4.0)
MCH: 28.3 pg (ref 26.0–34.0)
MCHC: 30.2 g/dL (ref 30.0–36.0)
MCV: 93.6 fL (ref 78.0–100.0)
MONO ABS: 0.6 10*3/uL (ref 0.1–1.0)
MONOS PCT: 7 %
NEUTROS ABS: 6.1 10*3/uL (ref 1.7–7.7)
NEUTROS PCT: 70 %
PLATELETS: 325 10*3/uL (ref 150–400)
RBC: 3.46 MIL/uL — ABNORMAL LOW (ref 3.87–5.11)
RDW: 16 % — AB (ref 11.5–15.5)
WBC: 8.7 10*3/uL (ref 4.0–10.5)

## 2017-10-31 LAB — DIGOXIN LEVEL: DIGOXIN LVL: 0.5 ng/mL — AB (ref 0.8–2.0)

## 2017-10-31 LAB — RAPID URINE DRUG SCREEN, HOSP PERFORMED
Amphetamines: NOT DETECTED
Barbiturates: NOT DETECTED
Benzodiazepines: NOT DETECTED
COCAINE: NOT DETECTED
OPIATES: NOT DETECTED
TETRAHYDROCANNABINOL: NOT DETECTED

## 2017-10-31 LAB — COMPREHENSIVE METABOLIC PANEL
ALK PHOS: 59 U/L (ref 38–126)
ALT: 13 U/L — AB (ref 14–54)
AST: 23 U/L (ref 15–41)
Albumin: 3.4 g/dL — ABNORMAL LOW (ref 3.5–5.0)
Anion gap: 8 (ref 5–15)
BILIRUBIN TOTAL: 0.6 mg/dL (ref 0.3–1.2)
BUN: 23 mg/dL — AB (ref 6–20)
CALCIUM: 9.7 mg/dL (ref 8.9–10.3)
CHLORIDE: 104 mmol/L (ref 101–111)
CO2: 25 mmol/L (ref 22–32)
CREATININE: 1.25 mg/dL — AB (ref 0.44–1.00)
GFR calc non Af Amer: 37 mL/min — ABNORMAL LOW (ref >60)
GFR, EST AFRICAN AMERICAN: 43 mL/min — AB (ref >60)
Glucose, Bld: 97 mg/dL (ref 65–99)
Potassium: 4.2 mmol/L (ref 3.5–5.1)
Sodium: 137 mmol/L (ref 135–145)
TOTAL PROTEIN: 5.7 g/dL — AB (ref 6.5–8.1)

## 2017-10-31 LAB — URINALYSIS, ROUTINE W REFLEX MICROSCOPIC
Bilirubin Urine: NEGATIVE
Glucose, UA: NEGATIVE mg/dL
Hgb urine dipstick: NEGATIVE
KETONES UR: NEGATIVE mg/dL
LEUKOCYTES UA: NEGATIVE
NITRITE: NEGATIVE
PH: 7 (ref 5.0–8.0)
Protein, ur: NEGATIVE mg/dL
Specific Gravity, Urine: 1.004 — ABNORMAL LOW (ref 1.005–1.030)

## 2017-10-31 LAB — LIPID PANEL
CHOLESTEROL: 175 mg/dL (ref 0–200)
HDL: 55 mg/dL (ref >40)
LDL Cholesterol: 93 mg/dL (ref 0–99)
Total CHOL/HDL Ratio: 3.2 RATIO
Triglycerides: 137 mg/dL (ref <150)
VLDL: 27 mg/dL (ref 0–40)

## 2017-10-31 LAB — TSH: TSH: 1.979 u[IU]/mL (ref 0.350–4.500)

## 2017-10-31 LAB — HEMOGLOBIN A1C
HEMOGLOBIN A1C: 5.3 % (ref 4.8–5.6)
MEAN PLASMA GLUCOSE: 105.41 mg/dL

## 2017-10-31 LAB — STREP PNEUMONIAE URINARY ANTIGEN: STREP PNEUMO URINARY ANTIGEN: NEGATIVE

## 2017-10-31 LAB — ETHANOL

## 2017-10-31 LAB — BRAIN NATRIURETIC PEPTIDE: B Natriuretic Peptide: 371.6 pg/mL — ABNORMAL HIGH (ref 0.0–100.0)

## 2017-10-31 MED ORDER — FUROSEMIDE 10 MG/ML IJ SOLN
40.0000 mg | INTRAMUSCULAR | Status: AC
  Administered 2017-10-31: 40 mg via INTRAVENOUS
  Filled 2017-10-31: qty 4

## 2017-10-31 MED ORDER — POLYETHYLENE GLYCOL 3350 17 G PO PACK: 17.0000 g | PACK | ORAL | Status: DC

## 2017-10-31 MED ORDER — LEVALBUTEROL HCL 0.63 MG/3ML IN NEBU
0.6300 mg | INHALATION_SOLUTION | Freq: Three times a day (TID) | RESPIRATORY_TRACT | Status: DC
  Administered 2017-10-31: 0.63 mg via RESPIRATORY_TRACT
  Filled 2017-10-31: qty 3

## 2017-10-31 MED ORDER — ACETAMINOPHEN 325 MG PO TABS
650.0000 mg | ORAL_TABLET | ORAL | Status: DC | PRN
  Administered 2017-10-31: 650 mg via ORAL
  Filled 2017-10-31: qty 2

## 2017-10-31 MED ORDER — LORAZEPAM 2 MG/ML IJ SOLN
0.5000 mg | Freq: Once | INTRAMUSCULAR | Status: AC
  Administered 2017-10-31: 0.5 mg via INTRAVENOUS
  Filled 2017-10-31: qty 1

## 2017-10-31 MED ORDER — DEXTROSE 5 % IV SOLN
1.0000 g | INTRAVENOUS | Status: DC
  Administered 2017-10-31: 1 g via INTRAVENOUS
  Filled 2017-10-31 (×2): qty 10

## 2017-10-31 MED ORDER — ACETAMINOPHEN 650 MG RE SUPP: 650.0000 mg | RECTAL | Status: DC | PRN

## 2017-10-31 MED ORDER — SACCHAROMYCES BOULARDII 250 MG PO CAPS
250.0000 mg | ORAL_CAPSULE | Freq: Two times a day (BID) | ORAL | Status: DC
  Administered 2017-10-31 – 2017-11-01 (×2): 250 mg via ORAL
  Filled 2017-10-31 (×2): qty 1

## 2017-10-31 MED ORDER — DIGOXIN 125 MCG PO TABS
0.0625 mg | ORAL_TABLET | Freq: Every day | ORAL | Status: DC
  Administered 2017-10-31 – 2017-11-01 (×2): 0.0625 mg via ORAL
  Filled 2017-10-31 (×2): qty 1

## 2017-10-31 MED ORDER — DIPHENHYDRAMINE HCL 50 MG/ML IJ SOLN: 12.5000 mg | Freq: Four times a day (QID) | INTRAMUSCULAR | Status: DC | PRN

## 2017-10-31 MED ORDER — ALPRAZOLAM 0.25 MG PO TABS
0.2500 mg | ORAL_TABLET | Freq: Every day | ORAL | Status: DC | PRN
Start: 2017-10-31 — End: 2017-11-01
  Administered 2017-10-31: 0.25 mg via ORAL
  Filled 2017-10-31: qty 1

## 2017-10-31 MED ORDER — LEVALBUTEROL HCL 0.63 MG/3ML IN NEBU
0.6300 mg | INHALATION_SOLUTION | Freq: Two times a day (BID) | RESPIRATORY_TRACT | Status: DC | PRN
  Administered 2017-11-01: 0.63 mg via RESPIRATORY_TRACT
  Filled 2017-10-31: qty 3

## 2017-10-31 MED ORDER — PANTOPRAZOLE SODIUM 40 MG PO TBEC
40.0000 mg | DELAYED_RELEASE_TABLET | Freq: Every day | ORAL | Status: DC
  Administered 2017-10-31: 40 mg via ORAL
  Filled 2017-10-31: qty 1

## 2017-10-31 MED ORDER — FUROSEMIDE 10 MG/ML IJ SOLN: 20.0000 mg | Freq: Once | INTRAMUSCULAR | Status: DC

## 2017-10-31 MED ORDER — PREGABALIN 50 MG PO CAPS
50.0000 mg | ORAL_CAPSULE | Freq: Every day | ORAL | Status: DC
Start: 2017-10-31 — End: 2017-11-01
  Administered 2017-11-01: 50 mg via ORAL
  Filled 2017-10-31: qty 1
  Filled 2017-10-31: qty 2

## 2017-10-31 MED ORDER — ACETAMINOPHEN 160 MG/5ML PO SOLN: 650.0000 mg | ORAL | Status: DC | PRN

## 2017-10-31 MED ORDER — URSODIOL 300 MG PO CAPS
300.0000 mg | ORAL_CAPSULE | Freq: Two times a day (BID) | ORAL | Status: DC
  Administered 2017-11-01: 300 mg via ORAL
  Filled 2017-10-31 (×3): qty 1

## 2017-10-31 MED ORDER — GUAIFENESIN ER 600 MG PO TB12
600.0000 mg | ORAL_TABLET | Freq: Two times a day (BID) | ORAL | Status: DC
  Administered 2017-10-31 – 2017-11-01 (×3): 600 mg via ORAL
  Filled 2017-10-31 (×3): qty 1

## 2017-10-31 MED ORDER — DIPHENHYDRAMINE HCL 50 MG/ML IJ SOLN: 12.5000 mg | Freq: Once | INTRAMUSCULAR | Status: DC

## 2017-10-31 MED ORDER — BUDESONIDE 0.5 MG/2ML IN SUSP
0.5000 mg | Freq: Two times a day (BID) | RESPIRATORY_TRACT | Status: DC
  Administered 2017-10-31 – 2017-11-01 (×3): 0.5 mg via RESPIRATORY_TRACT
  Filled 2017-10-31 (×4): qty 2

## 2017-10-31 MED ORDER — CARVEDILOL 3.125 MG PO TABS
3.1250 mg | ORAL_TABLET | Freq: Two times a day (BID) | ORAL | Status: DC
  Administered 2017-10-31 – 2017-11-01 (×2): 3.125 mg via ORAL
  Filled 2017-10-31 (×3): qty 1

## 2017-10-31 MED ORDER — FLUTICASONE PROPIONATE 50 MCG/ACT NA SUSP: 2.0000 | Freq: Every day | NASAL | Status: DC | PRN

## 2017-10-31 MED ORDER — STROKE: EARLY STAGES OF RECOVERY BOOK
Freq: Once | Status: DC
  Filled 2017-10-31: qty 1

## 2017-10-31 MED ORDER — BUPROPION HCL ER (XL) 150 MG PO TB24
150.0000 mg | ORAL_TABLET | Freq: Every day | ORAL | Status: DC
Start: 2017-10-31 — End: 2017-11-01
  Administered 2017-10-31 – 2017-11-01 (×2): 150 mg via ORAL
  Filled 2017-10-31 (×2): qty 1

## 2017-10-31 MED ORDER — SENNOSIDES-DOCUSATE SODIUM 8.6-50 MG PO TABS: 1.0000 | ORAL_TABLET | Freq: Every evening | ORAL | Status: DC | PRN

## 2017-10-31 MED ORDER — PANTOPRAZOLE SODIUM 40 MG PO TBEC
40.0000 mg | DELAYED_RELEASE_TABLET | Freq: Two times a day (BID) | ORAL | Status: DC
  Administered 2017-10-31 – 2017-11-01 (×2): 40 mg via ORAL
  Filled 2017-10-31 (×2): qty 1

## 2017-10-31 MED ORDER — APIXABAN 5 MG PO TABS
5.0000 mg | ORAL_TABLET | Freq: Two times a day (BID) | ORAL | Status: DC
  Administered 2017-10-31 – 2017-11-01 (×2): 5 mg via ORAL
  Filled 2017-10-31 (×3): qty 1

## 2017-10-31 MED ORDER — LEVOTHYROXINE SODIUM 75 MCG PO TABS
75.0000 ug | ORAL_TABLET | Freq: Every day | ORAL | Status: DC
Start: 2017-10-31 — End: 2017-11-01
  Administered 2017-10-31 – 2017-11-01 (×2): 75 ug via ORAL
  Filled 2017-10-31 (×2): qty 1

## 2017-10-31 MED ORDER — POLYSACCHARIDE IRON COMPLEX 150 MG PO CAPS
150.0000 mg | ORAL_CAPSULE | Freq: Every day | ORAL | Status: DC
  Administered 2017-10-31 – 2017-11-01 (×2): 150 mg via ORAL
  Filled 2017-10-31 (×2): qty 1

## 2017-10-31 MED ORDER — IPRATROPIUM BROMIDE 0.02 % IN SOLN
0.5000 mg | Freq: Two times a day (BID) | RESPIRATORY_TRACT | Status: DC
  Administered 2017-10-31 – 2017-11-01 (×3): 0.5 mg via RESPIRATORY_TRACT
  Filled 2017-10-31 (×3): qty 2.5

## 2017-10-31 NOTE — ED Notes (Signed)
Patient transported to CT 

## 2017-10-31 NOTE — ED Notes (Signed)
Heart Healthy Diet Has been ordered for Dinner.

## 2017-10-31 NOTE — ED Notes (Signed)
Lunch Tray Ordered @ 1126.  

## 2017-10-31 NOTE — ED Notes (Signed)
Admitting Provider at bedside. 

## 2017-10-31 NOTE — Telephone Encounter (Signed)
Will route to Truitt Merle NP.

## 2017-10-31 NOTE — ED Notes (Signed)
Pt given hot tea. Meal tray ordered.

## 2017-10-31 NOTE — Progress Notes (Signed)
Patient is a 84 year old female with h/o CAD, CHF, atrial flutter s/p ablation on Eliquis, PVD, breast cancer, anemia, CKD stage III, depression, anxiety, macular degeneration, and GERD; who was admitted this morning when she presented with tongue thickness and dysarthria after she got Avelox for ongoing cough.  Patient is allergic to fluoroquinolones.  She says she has been having cough for 3-4 weeks.  CT head did not show any acute findings.  MRI of the brain also did not show any acute findings.  Her tongue thickness resolved and currently she is back to her baseline.  Chest x-ray showing cardiomegaly with vascular congestion.  She has been given 1 dose of IV Lasix.  Patient seen and examined.  Noted to be discharged home but she is concerned because of her ongoing cough that has been persistent for the past couple of weeks. Because of her ongoing symptoms ordered chest CT without contrast.  Given her allergy to fluoroquinolone will start her on IV Rocephin while we are awaiting the chest CT results.  Reluctant to do azithromycin because she has heart arrhythmia and is on digoxin.  We will also start her on scheduled Xopenex nebulizers, PPI twice daily, Mucinex.  Plan Discussed at length with the patient as well as her daughter at the bedside and they verbalized understanding.
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
## 2017-10-31 NOTE — Telephone Encounter (Signed)
Adron Bene( Son) is calling because she was taken to the ER on last night and she is still in the ED and they are trying to decide about the echo and deciding about admitting her . He is wanting you to know this information . Please call if you have any questions . Thanks

## 2017-10-31 NOTE — ED Notes (Signed)
Patient transported to MRI 

## 2017-11-01 ENCOUNTER — Observation Stay (HOSPITAL_BASED_OUTPATIENT_CLINIC_OR_DEPARTMENT_OTHER): Payer: Medicare Other

## 2017-11-01 DIAGNOSIS — J40 Bronchitis, not specified as acute or chronic: Secondary | ICD-10-CM | POA: Diagnosis not present

## 2017-11-01 DIAGNOSIS — I5030 Unspecified diastolic (congestive) heart failure: Secondary | ICD-10-CM

## 2017-11-01 DIAGNOSIS — G459 Transient cerebral ischemic attack, unspecified: Secondary | ICD-10-CM | POA: Diagnosis not present

## 2017-11-01 DIAGNOSIS — I482 Chronic atrial fibrillation: Secondary | ICD-10-CM | POA: Diagnosis not present

## 2017-11-01 DIAGNOSIS — I340 Nonrheumatic mitral (valve) insufficiency: Secondary | ICD-10-CM

## 2017-11-01 DIAGNOSIS — E039 Hypothyroidism, unspecified: Secondary | ICD-10-CM | POA: Diagnosis not present

## 2017-11-01 DIAGNOSIS — F419 Anxiety disorder, unspecified: Secondary | ICD-10-CM | POA: Diagnosis not present

## 2017-11-01 DIAGNOSIS — D509 Iron deficiency anemia, unspecified: Secondary | ICD-10-CM | POA: Diagnosis not present

## 2017-11-01 DIAGNOSIS — N183 Chronic kidney disease, stage 3 (moderate): Secondary | ICD-10-CM | POA: Diagnosis not present

## 2017-11-01 DIAGNOSIS — I10 Essential (primary) hypertension: Secondary | ICD-10-CM | POA: Diagnosis not present

## 2017-11-01 LAB — ECHOCARDIOGRAM COMPLETE
Ao-asc: 33 cm
CHL CUP MV DEC (S): 211
CHL CUP RV SYS PRESS: 61 mmHg
E decel time: 211 msec
FS: 30 % (ref 28–44)
HEIGHTINCHES: 66 in
IVS/LV PW RATIO, ED: 1
LA vol index: 42.2 mL/m2
LA vol: 73.9 mL
LADIAMINDEX: 2.4 cm/m2
LASIZE: 42 mm
LAVOLA4C: 63 mL
LEFT ATRIUM END SYS DIAM: 42 mm
LVOT area: 3.46 cm2
LVOTD: 21 mm
MV pk E vel: 2.7 m/s
PV Reg grad dias: 6 mmHg
PV Reg vel dias: 122 cm/s
PW: 11 mm — AB (ref 0.6–1.1)
RV TAPSE: 12.7 mm
Reg peak vel: 338 cm/s
TR max vel: 338 cm/s
WEIGHTICAEL: 2313.95 [oz_av]

## 2017-11-01 LAB — BASIC METABOLIC PANEL
Anion gap: 8 (ref 5–15)
BUN: 24 mg/dL — ABNORMAL HIGH (ref 6–20)
CALCIUM: 9.2 mg/dL (ref 8.9–10.3)
CO2: 26 mmol/L (ref 22–32)
Chloride: 102 mmol/L (ref 101–111)
Creatinine, Ser: 1.16 mg/dL — ABNORMAL HIGH (ref 0.44–1.00)
GFR, EST AFRICAN AMERICAN: 47 mL/min — AB (ref >60)
GFR, EST NON AFRICAN AMERICAN: 40 mL/min — AB (ref >60)
GLUCOSE: 94 mg/dL (ref 65–99)
Potassium: 3.6 mmol/L (ref 3.5–5.1)
Sodium: 136 mmol/L (ref 135–145)

## 2017-11-01 LAB — LEGIONELLA PNEUMOPHILA SEROGP 1 UR AG: L. PNEUMOPHILA SEROGP 1 UR AG: NEGATIVE

## 2017-11-01 MED ORDER — PANTOPRAZOLE SODIUM 40 MG PO TBEC: 40.0000 mg | DELAYED_RELEASE_TABLET | Freq: Two times a day (BID) | ORAL | 0 refills | Status: DC

## 2017-11-01 MED ORDER — LEVALBUTEROL HCL 0.63 MG/3ML IN NEBU
0.6300 mg | INHALATION_SOLUTION | Freq: Three times a day (TID) | RESPIRATORY_TRACT | Status: DC
  Administered 2017-11-01: 0.63 mg via RESPIRATORY_TRACT
  Filled 2017-11-01: qty 3

## 2017-11-01 MED ORDER — GUAIFENESIN ER 600 MG PO TB12: 600.0000 mg | ORAL_TABLET | Freq: Two times a day (BID) | ORAL | 0 refills | Status: AC

## 2017-11-01 MED ORDER — DOXYCYCLINE HYCLATE 50 MG PO CAPS: 100.0000 mg | ORAL_CAPSULE | Freq: Two times a day (BID) | ORAL | 0 refills | Status: AC

## 2017-11-01 MED ORDER — LEVALBUTEROL TARTRATE 45 MCG/ACT IN AERO: 1.0000 | INHALATION_SPRAY | RESPIRATORY_TRACT | 2 refills | Status: DC | PRN

## 2017-11-01 MED ORDER — CEPHALEXIN 500 MG PO CAPS: 500.0000 mg | ORAL_CAPSULE | Freq: Two times a day (BID) | ORAL | 0 refills | Status: AC

## 2017-11-01 NOTE — Clinical Social Work Note (Signed)
Clinical Social Work Assessment  Patient Details  Name: Debra Brooks MRN: [id number] Date of Birth: 15-Jun-1925  Date of referral:  11/01/17               Reason for consult:  Facility Placement                Permission sought to share information with:  Facility Sport and exercise psychologist, Family Supports Permission granted to share information::  Yes, Verbal Permission Granted  Name::     Physicist, medical::  [facility]  Relationship::  Daughter  Contact Information:     Housing/Transportation Living arrangements for the past 2 months:  Materials engineer, [place] of Information:  Patient Patient Interpreter Needed:  None Criminal Activity/Legal Involvement Pertinent to Current Situation/Hospitalization:  No - Comment as needed Significant Relationships:  Adult Children, Other Family Members Lives with:  Self, Facility Resident Do you feel safe going back to the place where you live?  Yes Need for family participation in patient care:  No (Coment)  Care giving concerns:  Patient has been living in independent living at [place], but has been in [place] since Christmas. Patient will return to SNF to finish her rehab at discharge.   Social Worker assessment / plan:  CSW met with patient and patient's daughter at bedside to discuss discharge plan and arrange discharge to [place]. CSW confirmed placement with Admissions at [facility]. CSW to continue to follow.  Employment status:  Retired Forensic scientist:  Medicare PT Recommendations:  Not assessed at this time Information / Referral to community resources:     Patient/Family's Response to care:  Patient agreeable to return to [facility]  Patient/Family's Understanding of and Emotional Response to Diagnosis, Current Treatment, and Prognosis:  Patient discussed how she was hopeful to finish her rehab soon, but was worried about her mobility and not feeling as sturdy as she has in the past. Patient  was appreciative of CSW assistance.  Emotional Assessment Appearance:  Appears stated age Attitude/Demeanor/Rapport:  Engaged Affect (typically observed):  Appropriate Orientation:  Oriented to Self, Oriented to Place, Oriented to  Time, Oriented to Situation Alcohol / Substance use:  Not Applicable Psych involvement (Current and /or in the community):  No (Comment)  Discharge Needs  Concerns to be addressed:  Care Coordination Readmission within the last 30 days:  No Current discharge risk:  Physical Impairment, Dependent with Mobility Barriers to Discharge:  No Barriers Identified   Geralynn Ochs, LCSW 11/01/2017, 1:58 PM

## 2017-11-01 NOTE — Progress Notes (Signed)
SLP Cancellation Note  Patient Details Name: SHANDALE MALAK MRN: [id number] DOB: 09-28-1925   Cancelled treatment:       Reason Eval/Treat Not Completed: SLP screened, no needs identified, will sign off   Antoinette Borgwardt 11/01/2017, 1:56 PM

## 2017-11-01 NOTE — Discharge Instructions (Signed)
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
## 2017-11-01 NOTE — Consult Note (Signed)
            [facility] CM Primary Care Navigator  11/01/2017  PALYN SCRIMA December 08, 1924 [id number]   [place] seepatient at the bedside to identify possible discharge needs butshe was already discharged per RN report.  Patient is from [place] facility and plan was for her to return back there. [facility].  Primary care provider's office is listed as providing transition of care (TOC) follow-up.  Patient has discharge instruction to follow-up with primary care provider for a visit in 1-2 weeks.   For additional questions please contact:  Edwena Felty A. Genova Kiner, BSN, RN-BC [facility] PRIMARY CARE Navigator Cell: [phone]

## 2017-11-01 NOTE — Care Management Note (Signed)
Case Management Note  Patient Details  Name: Debra Brooks MRN: [id number] Date of Birth: [birth year]  Subjective/Objective:     Pt in with dysarthria. She is from [place] originally but states she has been in the rehab section the last 2 weeks. Pt states she also has an aide that assists her when she is at [facility].                Action/Plan: Pt discharging back to [facility] rehab today. CSW updated. [facility] to provide transportation for the patient. No further needs per CM.  Expected Discharge Date:  11/01/17               Expected Discharge Plan:  Skilled Nursing Facility  In-House Referral:  Clinical Social Work  Discharge planning Services     Post Acute Care Choice:    Choice offered to:     DME Arranged:    DME Agency:     HH Arranged:    [place] Agency:     Status of Service:  Completed, signed off  If discussed at [employer], dates discussed:    Additional Comments:  Pollie Friar, RN 11/01/2017, 1:42 PM

## 2017-11-01 NOTE — Discharge Summary (Signed)
Physician Discharge Summary  Debra Brooks CHE:[id number] DOB: [birth year] DOA: 10/30/2017  PCP: Gayland Curry, DO  Admit date: 10/30/2017 Discharge date: 11/01/2017  Admitted From: Home Disposition:  Home  Recommendations for Outpatient Follow-up:  1. Follow up with PCP in 1-2 weeks 2. Please obtain BMP/CBC in one week 3. Please follow up on the following pending results:  Discharge Condition: stable CODE STATUS: DNR Diet recommendation: Heart Healthy   Brief/Interim Summary: Debra Brooks is a 84 y.o. female with medical history significant of CAD, CHF, atrial flutter s/p ablation on Eliquis, PVD, breast cancer, anemia, CKD stage III, depression, anxiety, macular degeneration, and GERD; who was admitted on 10/31/2017 when she presented with tongue thickness and having difficult time speaking. Patient also had persistent for the last 3-4 weeks cough that was initially productive and now has been nonproductive over the last 10 days. She had been placed on Avelox for which patient had taken 2 days worth of medication.  Patient notes previous allergy to Levaquin.  She thinks her symptoms started after she got the Avelox. CT head did not show any acute findings.  MRI of the brain also did not show any acute findings.  Her tongue thickness resolved and currently she is back to her baseline.  Chest x-ray showed cardiomegaly with vascular congestion. She was also given 1 dose of IV Lasix. Given her persistent symptoms I ordered a chest CT without contrast. Per report: Tiny pleural effusions and atelectasis. No other cause for cough identified. No volume overload.  2. Thoracic aorta atherosclerotic change. No aneurysm. Coronary artery calcifications. 3. Emphysema. 4. Stable compression fracture of a midthoracic vertebral body, unchanged since 2016. she was started on IV ceftriaxone, Xopenex nebulizers.  I also increased her Protonix dose to 40 mg p.o. twice daily.  She improved with treatment.  I discussed  the CT results at length with the patient as well as her niece at the bedside and explained to them about the findings of emphysema.  I advised her that she should follow-up with a pulmonologist in the clinic outpatient.  2D echocardiogram also was ordered as a part of stroke workup and is being done now.  Results still pending at this time.  I discussed with the family.  She already has a cardiologist that she follows up with outpatient and has an upcoming appointment with a cardiologist and can follow-up on the echo results outpatient.  Patient's digoxin level also was thought to be low.  She says she will follow-up with her cardiologist.  Otherwise patient is doing well and is requesting to be discharged.  Vitals, Physical examination at the time of discharge fairly stable.  Discharge Diagnoses:  Active Problems:   Hypertension   CHF (congestive heart failure) (HCC)   Anxiety   Chronic kidney disease (CKD), stage III (moderate) (HCC)   Anemia, iron deficiency   Bronchitis   Chronic atrial fibrillation (HCC)   Depression, major, in remission [facility])   Dysarthria   Hypothyroidism  Discharge Instructions Discharge plan of care discussed with the patient and her niece at the bedside.  I answered all of the questions appropriately to the best of her knowledge.  Patient verbalized clear understanding and is being discharged in stable condition.  Allergies as of 11/01/2017      Reactions   Moxifloxacin Anaphylaxis   Tongue thickness and dysarthria   Prolia [denosumab] Other (See Comments)   Arthralgias   Iodinated Diagnostic Agents    Unknown allergy' but it was  documented that she did fine and had no problems with the 13 hour prep for CT consisting of prednisone and benadryl before imaging.  Today on 11/06/14, she did the 1 hour emergent prep of prednisone and benadryl 1 hour before testing and after imaging, she had no problems with the IV dye   Iodine Hives   Levaquin [levofloxacin] Nausea Only    Pacerone [amiodarone]    unknown   Valium [diazepam]    unknown      Medication List    STOP taking these medications   moxifloxacin 400 MG tablet Commonly known as:  AVELOX     TAKE these medications   acetaminophen 325 MG tablet Commonly known as:  TYLENOL Take 650 mg by mouth 2 (two) times daily.   ALPRAZolam 0.25 MG tablet Commonly known as:  XANAX TAKE 1 TABLET AT BEDTIME AS NEEDED FOR REST AND AN ADDITIONAL DAILY FOR ANXIETY.   budesonide 0.5 MG/2ML nebulizer solution Commonly known as:  PULMICORT Take 0.5 mg by nebulization 2 (two) times daily.   buPROPion 150 MG 24 hr tablet Commonly known as:  WELLBUTRIN XL TAKE (1) TABLET DAILY IN THE MORNING.   calcium-vitamin D 500-200 MG-UNIT tablet Commonly known as:  OSCAL WITH D Take 1 tablet by mouth daily with breakfast.   carvedilol 3.125 MG tablet Commonly known as:  COREG TAKE 1 TABLET TWICE DAILY WITH A MEAL.   cephALEXin 500 MG capsule Commonly known as:  KEFLEX Take 1 capsule (500 mg total) by mouth 2 (two) times daily for 5 days.   digoxin 0.125 MG tablet Commonly known as:  DIGOX Take 0.5 tablets (0.0625 mg total) by mouth daily.   doxycycline 50 MG capsule Commonly known as:  VIBRAMYCIN Take 2 capsules (100 mg total) by mouth 2 (two) times daily for 5 days.   ELIQUIS 5 MG Tabs tablet Generic drug:  apixaban TAKE 1 TABLET TWICE DAILY.   furosemide 40 MG tablet Commonly known as:  LASIX TAKE 1&1/2 TABLETS DAILY. OK TO TAKE 1 EXTRA TAB AS NEEDED FOR SWELLING   guaiFENesin 600 MG 12 hr tablet Commonly known as:  MUCINEX Take 1 tablet (600 mg total) by mouth 2 (two) times daily for 5 days.   ipratropium 0.02 % nebulizer solution Commonly known as:  ATROVENT Take 0.5 mg by nebulization 2 (two) times daily.   levalbuterol 0.63 MG/3ML nebulizer solution Commonly known as:  XOPENEX Take 0.63 mg by nebulization 2 (two) times daily as needed for wheezing or shortness of breath.   levalbuterol 45  MCG/ACT inhaler Commonly known as:  XOPENEX HFA Inhale 1 puff into the lungs every 4 (four) hours as needed for wheezing.   LYRICA 50 MG capsule Generic drug:  pregabalin TAKE 1 CAPSULE ONCE DAILY TO REDUCE NEUROPATHIC PAIN.   Melatonin 10 MG Caps Take 1 tablet by mouth at bedtime.   mometasone 50 MCG/ACT nasal spray Commonly known as:  NASONEX Place 2 sprays into the nose daily.   pantoprazole 40 MG tablet Commonly known as:  PROTONIX Take 1 tablet (40 mg total) by mouth 2 (two) times daily. What changed:  when to take this   POLY-IRON 150 150 MG capsule Generic drug:  iron polysaccharides TAKE (1) CAPSULE DAILY.   polyethylene glycol packet Commonly known as:  MIRALAX / GLYCOLAX Take 17 g by mouth See admin instructions. Five days in a row   potassium chloride SA 20 MEQ tablet Commonly known as:  K-DUR,KLOR-CON TAKE 1 TABLET DAILY. AND TAKE  AS NEEDED IF SECOND FUROSEMIDE TABLET IS NEEDED.   PRESERVISION AREDS PO Take 1 tablet by mouth 2 (two) times daily.   saccharomyces boulardii 250 MG capsule Commonly known as:  FLORASTOR Take 250 mg by mouth 2 (two) times daily.   SYNTHROID 75 MCG tablet Generic drug:  levothyroxine TAKE 1 TABLET ONCE DAILY.   ursodiol 500 MG tablet Commonly known as:  ACTIGALL Take 250 mg by mouth 2 (two) times daily.   Vitamin C 500 MG Chew Take 1 tablet by mouth daily ( chewable )   Vitamin D3 5000 units Tabs Take 1 tablet by mouth daily.       Allergies  Allergen Reactions  . Moxifloxacin Anaphylaxis    Tongue thickness and dysarthria  . Prolia [Denosumab] Other (See Comments)    Arthralgias  . Iodinated Diagnostic Agents     Unknown allergy' but it was documented that she did fine and had no problems with the 13 hour prep for CT consisting of prednisone and benadryl before imaging.  Today on 11/06/14, she did the 1 hour emergent prep of prednisone and benadryl 1 hour before testing and after imaging, she had no problems with the  IV dye  . Iodine Hives  . Levaquin [Levofloxacin] Nausea Only  . Pacerone [Amiodarone]     unknown  . Valium [Diazepam]     unknown    Consultations:  No consults   Procedures/Studies: Dg Chest 2 View  Result Date: 10/31/2017 CLINICAL DATA:  Tachycardia.  Recent diagnosis of pneumonia. EXAM: CHEST  2 VIEW COMPARISON:  10/17/2017 FINDINGS: Stable cardiomegaly and tortuous atherosclerotic aorta. Mild vascular congestion. Fluid in the fissures without large subpulmonic effusion. No consolidation or pneumothorax. No acute osseous abnormalities are seen. Chronic compression deformity in the mid lower thoracic spine. Truncated distal left clavicle may be postsurgical or resorption. IMPRESSION: Cardiomegaly with vascular congestion. Minimal fissure oral fluid. Findings suggest mild fluid overload. Electronically Signed   By: Jeb Levering M.D.   On: 10/31/2017 01:50   Dg Chest 2 View  Result Date: 10/17/2017 CLINICAL DATA:  84 year old with acute onset of cough, chest congestion and fever. EXAM: CHEST  2 VIEW COMPARISON:  11/10/2014, 11/06/2014 and earlier, including CTA chest 11/06/2014. FINDINGS: Cardiac silhouette mildly enlarged, unchanged allowing for differences in degree of inspiration. Thoracic aorta tortuous and atherosclerotic, unchanged. Hilar and mediastinal contours otherwise unremarkable. Lungs clear. Bronchovascular markings normal. Pulmonary vascularity normal. No visible pleural effusions. No pneumothorax. Eventration of the right anterior hemidiaphragm, unchanged. Degenerative changes throughout the thoracic spine, thoracic levoscoliosis and thoracolumbar dextroscoliosis, and osseous demineralization as noted previously. IMPRESSION: 1.  No acute cardiopulmonary disease. 2. Stable mild-to-moderate cardiomegaly without pulmonary edema. 3.  Aortic Atherosclerosis (ICD10-170.0) Electronically Signed   By: Evangeline Dakin M.D.   On: 10/17/2017 12:54   Ct Chest Wo Contrast  Result  Date: 10/31/2017 CLINICAL DATA:  Cough for 3 weeks. Today's chest x-ray suggested fluid overload. EXAM: CT CHEST WITHOUT CONTRAST TECHNIQUE: Multidetector CT imaging of the chest was performed following the standard protocol without IV contrast. COMPARISON:  Chest x-ray October 31, 2016.  Chest CT November 06, 2014. FINDINGS: Cardiovascular: The thoracic aorta is atherosclerotic but nonaneurysmal. The central pulmonary artery is normal. Mild cardiomegaly. Coronary artery calcifications. Mediastinum/Nodes: The thyroid and esophagus are unremarkable. Tiny pleural effusions. No pericardial effusion. Shotty nodes are seen in the mediastinum, likely reactive with no gross adenopathy. No other adenopathy in the chest. Lungs/Pleura: Central airways are normal. No pneumothorax. Mild scattered subsegmental atelectasis. Emphysematous  changes seen in the lungs. No suspicious nodules or masses. No over pulmonary edema. No evidence of pneumonia or aspiration. Upper Abdomen: No acute abnormality. Musculoskeletal: Anterior wedging of a midthoracic vertebral body is stable since January 2016. No acute bony abnormalities. IMPRESSION: 1. Tiny pleural effusions and atelectasis. No other cause for cough identified. No volume overload. 2. Thoracic aorta atherosclerotic change. No aneurysm. Coronary artery calcifications. 3. Emphysema. 4. Stable compression fracture of a midthoracic vertebral body, unchanged since 2016. Aortic Atherosclerosis (ICD10-I70.0) and Emphysema (ICD10-J43.9). Electronically Signed   By: Dorise Bullion III M.D   On: 10/31/2017 18:42   Mr Angiogram Head Wo Contrast  Result Date: 10/31/2017 CLINICAL DATA:  Initial evaluation for acute aphasia. EXAM: MRI HEAD WITHOUT CONTRAST MRA HEAD WITHOUT CONTRAST TECHNIQUE: Multiplanar, multiecho pulse sequences of the brain and surrounding structures were obtained without intravenous contrast. Angiographic images of the head were obtained using MRA technique without contrast.  COMPARISON:  Prior CT from 10/30/2017. FINDINGS: MRI HEAD FINDINGS Brain: Generalized age related cerebral volume loss. Minimal patchy T2/FLAIR hyperintensity within the periventricular white matter, most like related chronic small vessel ischemic changes, felt to be within normal limits for age. No abnormal foci of restricted diffusion to suggest acute or subacute ischemia. Gray-white matter differentiation well maintained. No encephalomalacia to suggest chronic infarction. No foci of susceptibility artifact to suggest acute or chronic intracranial hemorrhage. No mass lesion, midline shift or mass effect. Ventricles normal in size without hydrocephalus. No extra-axial fluid collection. Major dural sinuses are grossly patent. Pituitary gland suprasellar region normal. Midline structures intact and normal. Vascular: Major intracranial vascular flow voids are maintained. Skull and upper cervical spine: Craniocervical junction normal. Upper cervical spine within normal limits. Bone marrow signal intensity normal. No scalp soft tissue abnormality. Sinuses/Orbits: Globes and orbital soft tissues within normal limits. Patient status post cataract extraction bilaterally. Paranasal sinuses are largely clear. Trace fluid noted within left mastoid air cells, of doubtful significance. Inner ear structures normal. Other: None. MRA HEAD FINDINGS ANTERIOR CIRCULATION: Study degraded by motion artifact. Distal cervical segments of the internal carotid arteries are patent with antegrade flow. Petrous, cavernous, and supraclinoid segments patent without flow-limiting stenosis. ICA termini widely patent. A1 segments, anterior communicating artery common anterior cerebral artery is widely patent. MCAs well perfused without stenosis or occlusion. POSTERIOR CIRCULATION: Vertebral arteries patent to the vertebrobasilar junction without stenosis. Left PICA patent proximally. Right PICA not visualized. Basilar artery patent to its distal  aspect without stenosis. Superior cerebral arteries patent proximally. Both of the posterior cerebral artery supplied via the basilar. Possible mild narrowing at the origin of the right P1 segment, not well evaluated on this motion degraded exam. PCAs perfused to their distal aspects without flow-limiting stenosis. IMPRESSION: MRI HEAD IMPRESSION: Normal MRI of the brain for age. No acute intracranial abnormality identified. MRA HEAD IMPRESSION: Normal intracranial MRA. No large or proximal arterial branch occlusion. No high-grade or correctable stenosis. Electronically Signed   By: Jeannine Boga M.D.   On: 10/31/2017 05:40   Mr Brain Wo Contrast  Result Date: 10/31/2017 CLINICAL DATA:  Initial evaluation for acute aphasia. EXAM: MRI HEAD WITHOUT CONTRAST MRA HEAD WITHOUT CONTRAST TECHNIQUE: Multiplanar, multiecho pulse sequences of the brain and surrounding structures were obtained without intravenous contrast. Angiographic images of the head were obtained using MRA technique without contrast. COMPARISON:  Prior CT from 10/30/2017. FINDINGS: MRI HEAD FINDINGS Brain: Generalized age related cerebral volume loss. Minimal patchy T2/FLAIR hyperintensity within the periventricular white matter, most like related chronic  small vessel ischemic changes, felt to be within normal limits for age. No abnormal foci of restricted diffusion to suggest acute or subacute ischemia. Gray-white matter differentiation well maintained. No encephalomalacia to suggest chronic infarction. No foci of susceptibility artifact to suggest acute or chronic intracranial hemorrhage. No mass lesion, midline shift or mass effect. Ventricles normal in size without hydrocephalus. No extra-axial fluid collection. Major dural sinuses are grossly patent. Pituitary gland suprasellar region normal. Midline structures intact and normal. Vascular: Major intracranial vascular flow voids are maintained. Skull and upper cervical spine: Craniocervical  junction normal. Upper cervical spine within normal limits. Bone marrow signal intensity normal. No scalp soft tissue abnormality. Sinuses/Orbits: Globes and orbital soft tissues within normal limits. Patient status post cataract extraction bilaterally. Paranasal sinuses are largely clear. Trace fluid noted within left mastoid air cells, of doubtful significance. Inner ear structures normal. Other: None. MRA HEAD FINDINGS ANTERIOR CIRCULATION: Study degraded by motion artifact. Distal cervical segments of the internal carotid arteries are patent with antegrade flow. Petrous, cavernous, and supraclinoid segments patent without flow-limiting stenosis. ICA termini widely patent. A1 segments, anterior communicating artery common anterior cerebral artery is widely patent. MCAs well perfused without stenosis or occlusion. POSTERIOR CIRCULATION: Vertebral arteries patent to the vertebrobasilar junction without stenosis. Left PICA patent proximally. Right PICA not visualized. Basilar artery patent to its distal aspect without stenosis. Superior cerebral arteries patent proximally. Both of the posterior cerebral artery supplied via the basilar. Possible mild narrowing at the origin of the right P1 segment, not well evaluated on this motion degraded exam. PCAs perfused to their distal aspects without flow-limiting stenosis. IMPRESSION: MRI HEAD IMPRESSION: Normal MRI of the brain for age. No acute intracranial abnormality identified. MRA HEAD IMPRESSION: Normal intracranial MRA. No large or proximal arterial branch occlusion. No high-grade or correctable stenosis. Electronically Signed   By: Jeannine Boga M.D.   On: 10/31/2017 05:40   Ct Head Code Stroke Wo Contrast  Result Date: 10/31/2017 CLINICAL DATA:  Code stroke.  Initial evaluation for acute aphasia. EXAM: CT HEAD WITHOUT CONTRAST TECHNIQUE: Contiguous axial images were obtained from the base of the skull through the vertex without intravenous contrast.  COMPARISON:  Prior MRI from 10/19/2015. FINDINGS: Brain: Age-related cerebral volume loss with mild chronic small vessel ischemic disease. No acute intracranial hemorrhage. No evidence for acute large vessel territory infarct. No mass lesion, midline shift or mass effect. No hydrocephalus. No extra-axial fluid collection. Vascular: No worrisome hyperdense vessel. Scattered vascular calcifications noted within the carotid siphons. Skull: Scalp soft tissues and calvarium within normal limits. Sinuses/Orbits: Globes and orbital soft tissues normal. Paranasal sinuses and mastoid air cells are clear. Other: None. ASPECTS [facility] Stroke Program Early CT Score) - Ganglionic level infarction (caudate, lentiform nuclei, internal capsule, insula, M1-M3 cortex): 7 - Supraganglionic infarction (M4-M6 cortex): 3 Total score (0-10 with 10 being normal): 10 IMPRESSION: 1. No acute intracranial infarct or other process identified. 2. ASPECTS is 10. 3. Age related cerebral atrophy with chronic small vessel ischemic disease. These results were communicated to Dr. Cheral Marker At 12:07 amon 1/3/2019by text page via the [facility] messaging system. Electronically Signed   By: Jeannine Boga M.D.   On: 10/31/2017 00:13     Subjective: Patient says she feels a lot better and is requesting to be discharged.   Discharge Exam: Vitals:   11/01/17 0809 11/01/17 0941  BP:  120/81  Pulse:  90  Resp:  17  Temp:  97.9 F (36.6 C)  SpO2: 98% 97%  Vitals:   11/01/17 0425 11/01/17 0700 11/01/17 0809 11/01/17 0941  BP: (!) 122/55   120/81  Pulse: 75   90  Resp: 18   17  Temp: 98 F (36.7 C)   97.9 F (36.6 C)  TempSrc: Oral   Axillary  SpO2: 91%  98% 97%  Weight:  65.6 kg (144 lb 10 oz)    Height:        General: Pt is alert, awake, not in acute distress Cardiovascular: RRR, S1/S2 +, no rubs, no gallops Respiratory: CTA bilaterally, no wheezing, no rhonchi Abdominal: Soft, NT, ND, bowel sounds + Extremities: no  edema, no cyanosis    The results of significant diagnostics from this hospitalization (including imaging, microbiology, ancillary and laboratory) are listed below for reference.     Microbiology: Recent Results (from the past 240 hour(s))  Respiratory Panel by PCR     Status: None   Collection Time: 10/31/17  6:35 AM  Result Value Ref Range Status   Adenovirus NOT DETECTED NOT DETECTED Final   Coronavirus 229E NOT DETECTED NOT DETECTED Final   Coronavirus HKU1 NOT DETECTED NOT DETECTED Final   Coronavirus NL63 NOT DETECTED NOT DETECTED Final   Coronavirus OC43 NOT DETECTED NOT DETECTED Final   Metapneumovirus NOT DETECTED NOT DETECTED Final   Rhinovirus / Enterovirus NOT DETECTED NOT DETECTED Final   Influenza A NOT DETECTED NOT DETECTED Final   Influenza B NOT DETECTED NOT DETECTED Final   Parainfluenza Virus 1 NOT DETECTED NOT DETECTED Final   Parainfluenza Virus 2 NOT DETECTED NOT DETECTED Final   Parainfluenza Virus 3 NOT DETECTED NOT DETECTED Final   Parainfluenza Virus 4 NOT DETECTED NOT DETECTED Final   Respiratory Syncytial Virus NOT DETECTED NOT DETECTED Final   Bordetella pertussis NOT DETECTED NOT DETECTED Final   Chlamydophila pneumoniae NOT DETECTED NOT DETECTED Final   Mycoplasma pneumoniae NOT DETECTED NOT DETECTED Final     Labs: BNP (last 3 results) Recent Labs    10/31/17 0556  BNP 505.3*   Basic Metabolic Panel: Recent Labs  Lab 10/30/17 2333 11/01/17 0255  NA 137 136  K 4.2 3.6  CL 104 102  CO2 25 26  GLUCOSE 97 94  BUN 23* 24*  CREATININE 1.25* 1.16*  CALCIUM 9.7 9.2   Liver Function Tests: Recent Labs  Lab 10/30/17 2333  AST 23  ALT 13*  ALKPHOS 59  BILITOT 0.6  PROT 5.7*  ALBUMIN 3.4*   No results for input(s): LIPASE, AMYLASE in the last 168 hours. No results for input(s): AMMONIA in the last 168 hours. CBC: Recent Labs  Lab 10/30/17 2333 10/31/17 0555  WBC 8.8 8.7  NEUTROABS 6.6 6.1  HGB 10.7* 9.8*  HCT 35.3* 32.4*   MCV 91.7 93.6  PLT 338 325   Cardiac Enzymes: No results for input(s): CKTOTAL, CKMB, CKMBINDEX, TROPONINI in the last 168 hours. BNP: Invalid input(s): POCBNP CBG: Recent Labs  Lab 10/30/17 2339  GLUCAP 89   D-Dimer No results for input(s): DDIMER in the last 72 hours. Hgb A1c Recent Labs    10/31/17 0556  HGBA1C 5.3   Lipid Profile Recent Labs    10/31/17 0556  CHOL 175  HDL 55  LDLCALC 93  TRIG 137  CHOLHDL 3.2   Thyroid function studies Recent Labs    10/31/17 0556  TSH 1.979   Anemia work up No results for input(s): VITAMINB12, FOLATE, FERRITIN, TIBC, IRON, RETICCTPCT in the last 72 hours. Urinalysis    Component Value  Date/Time   COLORURINE STRAW (A) 10/31/2017 0112   APPEARANCEUR CLEAR 10/31/2017 0112   LABSPEC 1.004 (L) 10/31/2017 0112   PHURINE 7.0 10/31/2017 0112   GLUCOSEU NEGATIVE 10/31/2017 0112   HGBUR NEGATIVE 10/31/2017 0112   BILIRUBINUR NEGATIVE 10/31/2017 0112   BILIRUBINUR Neg 10/09/2016 1355   KETONESUR NEGATIVE 10/31/2017 0112   PROTEINUR NEGATIVE 10/31/2017 0112   UROBILINOGEN 0.2 10/09/2016 1355   UROBILINOGEN 0.2 02/03/2012 0156   NITRITE NEGATIVE 10/31/2017 0112   LEUKOCYTESUR NEGATIVE 10/31/2017 0112   Sepsis Labs Invalid input(s): PROCALCITONIN,  WBC,  LACTICIDVEN Microbiology Recent Results (from the past 240 hour(s))  Respiratory Panel by PCR     Status: None   Collection Time: 10/31/17  6:35 AM  Result Value Ref Range Status   Adenovirus NOT DETECTED NOT DETECTED Final   Coronavirus 229E NOT DETECTED NOT DETECTED Final   Coronavirus HKU1 NOT DETECTED NOT DETECTED Final   Coronavirus NL63 NOT DETECTED NOT DETECTED Final   Coronavirus OC43 NOT DETECTED NOT DETECTED Final   Metapneumovirus NOT DETECTED NOT DETECTED Final   Rhinovirus / Enterovirus NOT DETECTED NOT DETECTED Final   Influenza A NOT DETECTED NOT DETECTED Final   Influenza B NOT DETECTED NOT DETECTED Final   Parainfluenza Virus 1 NOT DETECTED NOT  DETECTED Final   Parainfluenza Virus 2 NOT DETECTED NOT DETECTED Final   Parainfluenza Virus 3 NOT DETECTED NOT DETECTED Final   Parainfluenza Virus 4 NOT DETECTED NOT DETECTED Final   Respiratory Syncytial Virus NOT DETECTED NOT DETECTED Final   Bordetella pertussis NOT DETECTED NOT DETECTED Final   Chlamydophila pneumoniae NOT DETECTED NOT DETECTED Final   Mycoplasma pneumoniae NOT DETECTED NOT DETECTED Final     Time coordinating discharge: Over 30 minutes  SIGNED:   Yaakov Guthrie, MD  [facility] 11/01/2017, 11:15 AM Pager [phone]  If 7PM-7AM, please contact night-coverage [email or web address] Password [email or web address]

## 2017-11-01 NOTE — Progress Notes (Signed)
  Echocardiogram 2D Echocardiogram has been performed.  Nili Honda G Shan Padgett 11/01/2017, 11:45 AM

## 2017-11-01 NOTE — Progress Notes (Signed)
Discharge to: [facility] Anticipated discharge date: 11/11/17 Family notified: Yes, at bedside Transportation by: Facility staff, will arrive within the hour  Report #: [phone]  CSW signing off.  Laveda Abbe LCSW [phone]

## 2017-11-01 NOTE — Telephone Encounter (Signed)
Spoke to her son Shanon Brow - I have reviewed the echo with him and her other studies. Overall, she does not feel to be too far off from her baseline.   I will see her back later this month as planned.   Burtis Junes, RN, [facility] [address] [place], [place]  [address] [phone]

## 2017-11-01 NOTE — Care Management Obs Status (Signed)
[facility] NOTIFICATION   Patient Details  Name: Debra Brooks MRN: [id number] Date of Birth: November 24, 1924   Medicare Observation Status Notification Given:  Yes    Pollie Friar, RN 11/01/2017, 12:39 PM

## 2017-11-04 ENCOUNTER — Telehealth: Payer: Self-pay

## 2017-11-04 DIAGNOSIS — I4891 Unspecified atrial fibrillation: Secondary | ICD-10-CM | POA: Diagnosis not present

## 2017-11-04 DIAGNOSIS — D649 Anemia, unspecified: Secondary | ICD-10-CM | POA: Diagnosis not present

## 2017-11-04 LAB — CBC AND DIFFERENTIAL
HCT: 31 — AB (ref 36–46)
Hemoglobin: 9.7 — AB (ref 12.0–16.0)
Platelets: 264 (ref 150–399)
WBC: 5.9

## 2017-11-04 LAB — BASIC METABOLIC PANEL
BUN: 24 — AB (ref 4–21)
Creatinine: 1 (ref 0.5–1.1)
Glucose: 90
Potassium: 4.3 (ref 3.4–5.3)
Sodium: 140 (ref 137–147)

## 2017-11-04 NOTE — Telephone Encounter (Signed)
Possible re-admission to facility. This is a patient you were seeing at [facility] . [facility] F/U is needed if patient was re-admitted to facility upon discharge. Hospital discharge from [facility] on 11/01/2017

## 2017-11-05 ENCOUNTER — Encounter: Payer: Self-pay | Admitting: Internal Medicine

## 2017-11-05 ENCOUNTER — Non-Acute Institutional Stay (SKILLED_NURSING_FACILITY): Payer: Medicare Other | Admitting: Internal Medicine

## 2017-11-05 DIAGNOSIS — I5042 Chronic combined systolic (congestive) and diastolic (congestive) heart failure: Secondary | ICD-10-CM | POA: Insufficient documentation

## 2017-11-05 DIAGNOSIS — K219 Gastro-esophageal reflux disease without esophagitis: Secondary | ICD-10-CM | POA: Diagnosis not present

## 2017-11-05 DIAGNOSIS — F325 Major depressive disorder, single episode, in full remission: Secondary | ICD-10-CM

## 2017-11-05 DIAGNOSIS — H353221 Exudative age-related macular degeneration, left eye, with active choroidal neovascularization: Secondary | ICD-10-CM

## 2017-11-05 DIAGNOSIS — I700 Atherosclerosis of aorta: Secondary | ICD-10-CM | POA: Insufficient documentation

## 2017-11-05 DIAGNOSIS — S22000S Wedge compression fracture of unspecified thoracic vertebra, sequela: Secondary | ICD-10-CM | POA: Diagnosis not present

## 2017-11-05 DIAGNOSIS — I2510 Atherosclerotic heart disease of native coronary artery without angina pectoris: Secondary | ICD-10-CM

## 2017-11-05 DIAGNOSIS — J209 Acute bronchitis, unspecified: Secondary | ICD-10-CM

## 2017-11-05 DIAGNOSIS — J439 Emphysema, unspecified: Secondary | ICD-10-CM

## 2017-11-05 DIAGNOSIS — M810 Age-related osteoporosis without current pathological fracture: Secondary | ICD-10-CM | POA: Diagnosis not present

## 2017-11-05 HISTORY — DX: Chronic combined systolic (congestive) and diastolic (congestive) heart failure: I50.42

## 2017-11-05 HISTORY — DX: Emphysema, unspecified: J43.9

## 2017-11-05 HISTORY — DX: Atherosclerosis of aorta: I70.0

## 2017-11-05 LAB — CULTURE, BLOOD (ROUTINE X 2)
Culture: NO GROWTH
Culture: NO GROWTH
SPECIAL REQUESTS: ADEQUATE
SPECIAL REQUESTS: ADEQUATE

## 2017-11-05 MED ORDER — PANTOPRAZOLE SODIUM 40 MG PO TBEC: 40.0000 mg | DELAYED_RELEASE_TABLET | Freq: Two times a day (BID) | ORAL | 3 refills | Status: DC

## 2017-11-05 NOTE — Progress Notes (Signed)
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 disease, esophagitis presence not specified -cont protonix bid short term until she sees me again, then may reduce to daily if cough resolved (previously on just once a day) - pantoprazole (PROTONIX) 40 MG tablet; Take 1 tablet (40 mg total) by mouth 2 (two) times daily before a meal.  Dispense: 60 tablet; Refill: 3  2. Chronic combined systolic and diastolic heart failure (HCC) -now cont coreg, lasix 60mg  po daily with prn tablet for additional edema per cardiology  3. Depression, major, in remission ([place]) -on chronic wellbutrin with prn xanax for anxiety spells  4. Exudative age-related macular degeneration, left eye, with active choroidal neovascularization ([employer]) -ongoing,vision declining  5. Pulmonary emphysema, unspecified emphysema type ([place]) -per CT chest, pt has had recurrent bronchitis episodes, has not had PFTs so refer to pulmonary, Dr. Melvyn Novas -sent home with atrovent and pulmicort nebs for future use -continues on prn xopenex inhaler  6. Acute  bronchitis, unspecified organism -finishes keflex and doxy in am tomorrow -resolved  7. Senile osteoporosis -refuses prolia due to arthralgias, cont ca with D and additional D3, weightbearing exercise with yoga and walking  8. Closed compression fracture of thoracic vertebra, sequela -since 2016 on imaging, uses tylenol bid for pain and does yoga stretching  Pt to d/c home today.  F/u with me in 2 wks.  Family/ staff Communication: discussed with rehab nurse, NP  Labs/tests ordered:  Will order cbc, bmp when I see her in clinic; pulmonary referral--placed in epic as pt leaving rehab now back to independent living  [place]. Laneshia Pina, D.O. [facility] [address], [place] [address] Cell Phone (Mon-Fri 8am-5pm):  [phone] On Call:  [phone] & follow prompts after 5pm & weekends Office Phone:  [phone] Office Fax:  [phone]

## 2017-11-12 DIAGNOSIS — H353114 Nonexudative age-related macular degeneration, right eye, advanced atrophic with subfoveal involvement: Secondary | ICD-10-CM | POA: Diagnosis not present

## 2017-11-12 DIAGNOSIS — H18423 Band keratopathy, bilateral: Secondary | ICD-10-CM | POA: Diagnosis not present

## 2017-11-12 DIAGNOSIS — H43813 Vitreous degeneration, bilateral: Secondary | ICD-10-CM | POA: Diagnosis not present

## 2017-11-12 DIAGNOSIS — Z961 Presence of intraocular lens: Secondary | ICD-10-CM | POA: Diagnosis not present

## 2017-11-12 DIAGNOSIS — H35372 Puckering of macula, left eye: Secondary | ICD-10-CM | POA: Diagnosis not present

## 2017-11-12 DIAGNOSIS — H353221 Exudative age-related macular degeneration, left eye, with active choroidal neovascularization: Secondary | ICD-10-CM | POA: Diagnosis not present

## 2017-11-14 ENCOUNTER — Other Ambulatory Visit: Payer: Self-pay | Admitting: Internal Medicine

## 2017-11-14 ENCOUNTER — Other Ambulatory Visit: Payer: Self-pay | Admitting: Nurse Practitioner

## 2017-11-15 ENCOUNTER — Other Ambulatory Visit: Payer: Self-pay | Admitting: Internal Medicine

## 2017-11-19 ENCOUNTER — Ambulatory Visit (INDEPENDENT_AMBULATORY_CARE_PROVIDER_SITE_OTHER): Payer: Medicare Other | Admitting: Nurse Practitioner

## 2017-11-19 ENCOUNTER — Encounter: Payer: Self-pay | Admitting: Nurse Practitioner

## 2017-11-19 ENCOUNTER — Encounter (INDEPENDENT_AMBULATORY_CARE_PROVIDER_SITE_OTHER): Payer: Self-pay

## 2017-11-19 VITALS — BP 140/76 | HR 85 | Ht 66.0 in | Wt 145.1 lb

## 2017-11-19 DIAGNOSIS — I2510 Atherosclerotic heart disease of native coronary artery without angina pectoris: Secondary | ICD-10-CM

## 2017-11-19 DIAGNOSIS — I10 Essential (primary) hypertension: Secondary | ICD-10-CM | POA: Diagnosis not present

## 2017-11-19 DIAGNOSIS — I5042 Chronic combined systolic (congestive) and diastolic (congestive) heart failure: Secondary | ICD-10-CM

## 2017-11-19 DIAGNOSIS — I4819 Other persistent atrial fibrillation: Secondary | ICD-10-CM

## 2017-11-19 DIAGNOSIS — I481 Persistent atrial fibrillation: Secondary | ICD-10-CM

## 2017-11-19 DIAGNOSIS — Z7901 Long term (current) use of anticoagulants: Secondary | ICD-10-CM | POA: Diagnosis not present
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
## 2017-11-19 NOTE — Patient Instructions (Addendum)
We will be checking the following labs today - NONE   Medication Instructions:    Continue with your current medicines.     Testing/Procedures To Be Arranged:  N/A  Follow-Up:   See me in about 3 months    Other Special Instructions:   Ok to call and cancel the visit with Dr. Melvyn Novas - we will just hold off on this for now.     If you need a refill on your cardiac medications before your next appointment, please call your pharmacy.   Call the [place] office at [phone] if you have any questions, problems or concerns.

## 2017-11-20 ENCOUNTER — Encounter: Payer: Self-pay | Admitting: Internal Medicine

## 2017-11-20 ENCOUNTER — Non-Acute Institutional Stay: Payer: Medicare Other | Admitting: Internal Medicine

## 2017-11-20 VITALS — BP 132/60 | HR 77 | Temp 98.3°F | Wt 144.0 lb

## 2017-11-20 DIAGNOSIS — R269 Unspecified abnormalities of gait and mobility: Secondary | ICD-10-CM | POA: Diagnosis not present

## 2017-11-20 DIAGNOSIS — I10 Essential (primary) hypertension: Secondary | ICD-10-CM | POA: Diagnosis not present

## 2017-11-20 DIAGNOSIS — J439 Emphysema, unspecified: Secondary | ICD-10-CM

## 2017-11-20 DIAGNOSIS — I2510 Atherosclerotic heart disease of native coronary artery without angina pectoris: Secondary | ICD-10-CM | POA: Diagnosis not present

## 2017-11-20 DIAGNOSIS — N183 Chronic kidney disease, stage 3 unspecified: Secondary | ICD-10-CM

## 2017-11-20 DIAGNOSIS — G609 Hereditary and idiopathic neuropathy, unspecified: Secondary | ICD-10-CM
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
## 2017-11-26 DIAGNOSIS — R140 Abdominal distension (gaseous): Secondary | ICD-10-CM | POA: Diagnosis not present

## 2017-11-26 DIAGNOSIS — D509 Iron deficiency anemia, unspecified: Secondary | ICD-10-CM | POA: Diagnosis not present

## 2017-11-28 ENCOUNTER — Institutional Professional Consult (permissible substitution): Payer: Self-pay | Admitting: Pulmonary Disease

## 2017-12-13 ENCOUNTER — Other Ambulatory Visit: Payer: Self-pay | Admitting: Internal Medicine

## 2017-12-13 NOTE — Telephone Encounter (Signed)
rx called into pharmacy

## 2017-12-16 ENCOUNTER — Other Ambulatory Visit: Payer: Self-pay | Admitting: Internal Medicine

## 2017-12-20 ENCOUNTER — Other Ambulatory Visit: Payer: Self-pay | Admitting: Internal Medicine

## 2017-12-23 ENCOUNTER — Encounter: Payer: Self-pay | Admitting: Internal Medicine

## 2017-12-23 DIAGNOSIS — N390 Urinary tract infection, site not specified: Secondary | ICD-10-CM | POA: Diagnosis not present

## 2017-12-23 DIAGNOSIS — Z79899 Other long term (current) drug therapy: Secondary | ICD-10-CM | POA: Diagnosis not present

## 2017-12-23 DIAGNOSIS — R309 Painful micturition, unspecified: Secondary | ICD-10-CM | POA: Diagnosis not present

## 2017-12-23 DIAGNOSIS — R319 Hematuria, unspecified: Secondary | ICD-10-CM | POA: Diagnosis not present

## 2017-12-24 ENCOUNTER — Other Ambulatory Visit: Payer: Self-pay | Admitting: Internal Medicine

## 2017-12-24 DIAGNOSIS — N3000 Acute cystitis without hematuria: Secondary | ICD-10-CM

## 2017-12-24 MED ORDER — CEPHALEXIN 250 MG PO CAPS: 250.0000 mg | ORAL_CAPSULE | Freq: Two times a day (BID) | ORAL | 0 refills | Status: AC

## 2017-12-24 NOTE — Progress Notes (Signed)
Pt called [facility] front desk twice today demanding antibiotics for her UTI because she was told her results were back.  Only urinalysis which did appear infected is back.  Push po fluids.  Take yogurt daily.  Begin keflex 250mg  po bid for 7 days for suspected UTI.  Was sent to [facility].  Avoided cipro due to nausea with levaquin and tongue swelling with avelox.    Nicola Quesnell L. Donnavin Vandenbrink, D.O. [facility] [address], [place] [address] Cell Phone (Mon-Fri 8am-5pm):  [phone] On Call:  [phone] & follow prompts after 5pm & weekends Office Phone:  [phone] Office Fax:  [phone]

## 2018-01-02 ENCOUNTER — Encounter: Payer: Self-pay | Admitting: Internal Medicine

## 2018-01-02 DIAGNOSIS — J440 Chronic obstructive pulmonary disease with acute lower respiratory infection: Secondary | ICD-10-CM | POA: Diagnosis not present

## 2018-01-02 DIAGNOSIS — D6869 Other thrombophilia: Secondary | ICD-10-CM | POA: Diagnosis not present

## 2018-01-02 DIAGNOSIS — I4891 Unspecified atrial fibrillation: Secondary | ICD-10-CM | POA: Diagnosis not present

## 2018-01-02 DIAGNOSIS — I482 Chronic atrial fibrillation: Secondary | ICD-10-CM | POA: Diagnosis not present

## 2018-01-02 DIAGNOSIS — D649 Anemia, unspecified: Secondary | ICD-10-CM | POA: Diagnosis not present

## 2018-01-02 DIAGNOSIS — K52832 Lymphocytic colitis: Secondary | ICD-10-CM | POA: Diagnosis not present

## 2018-01-02 LAB — BASIC METABOLIC PANEL
BUN: 23 — AB (ref 4–21)
Creatinine: 1 (ref 0.5–1.1)
Glucose: 89
Potassium: 3.9 (ref 3.4–5.3)
Sodium: 142 (ref 137–147)

## 2018-01-02 LAB — HEPATIC FUNCTION PANEL
ALT: 11 (ref 7–35)
AST: 16 (ref 13–35)
Alkaline Phosphatase: 71 (ref 25–125)
Bilirubin, Total: 0.3

## 2018-01-02 LAB — CBC AND DIFFERENTIAL
HCT: 36 (ref 36–46)
Hemoglobin: 11.7 — AB (ref 12.0–16.0)
Platelets: 220 (ref 150–399)
WBC: 5.5

## 2018-01-03 ENCOUNTER — Encounter: Payer: Self-pay | Admitting: Internal Medicine

## 2018-01-08 ENCOUNTER — Non-Acute Institutional Stay: Payer: Medicare Other | Admitting: Internal Medicine

## 2018-01-08 ENCOUNTER — Encounter: Payer: Self-pay | Admitting: Internal Medicine

## 2018-01-08 VITALS — BP 120/62 | HR 70 | Temp 98.4°F | Wt 144.0 lb

## 2018-01-08 DIAGNOSIS — D509 Iron deficiency anemia, unspecified: Secondary | ICD-10-CM

## 2018-01-08 DIAGNOSIS — I5042 Chronic combined systolic (congestive) and diastolic (congestive) heart failure: Secondary | ICD-10-CM | POA: Diagnosis not present

## 2018-01-08 DIAGNOSIS — J439 Emphysema, unspecified: Secondary | ICD-10-CM | POA: Diagnosis not present

## 2018-01-08 DIAGNOSIS — D6869 Other thrombophilia: Secondary | ICD-10-CM | POA: Diagnosis not present

## 2018-01-08 DIAGNOSIS — N183 Chronic kidney disease, stage 3 unspecified: Secondary | ICD-10-CM

## 2018-01-08 DIAGNOSIS — I2510 Atherosclerotic heart disease of native coronary artery without angina pectoris: Secondary | ICD-10-CM

## 2018-01-08 DIAGNOSIS — K219 Gastro-esophageal reflux disease without esophagitis: Secondary | ICD-10-CM | POA: Diagnosis not present

## 2018-01-08 DIAGNOSIS — I4891 Unspecified atrial fibrillation: Secondary | ICD-10-CM

## 2018-01-08 DIAGNOSIS — I482 Chronic atrial fibrillation, unspecified: Secondary | ICD-10-CM
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
## 2018-01-09 ENCOUNTER — Other Ambulatory Visit: Payer: Self-pay | Admitting: *Deleted

## 2018-01-09 MED ORDER — ZOSTER VAC RECOMB ADJUVANTED 50 MCG/0.5ML IM SUSR: 0.5000 mL | Freq: Once | INTRAMUSCULAR | 1 refills | Status: AC

## 2018-01-16 ENCOUNTER — Other Ambulatory Visit: Payer: Self-pay | Admitting: Internal Medicine

## 2018-01-16 NOTE — Telephone Encounter (Signed)
A medication refill was received from pharmacy for lyrica 50 mg. Rx was called in to pharmacy after verifying last fill date, provider, and quantity on PMP [place].

## 2018-02-04 ENCOUNTER — Ambulatory Visit (INDEPENDENT_AMBULATORY_CARE_PROVIDER_SITE_OTHER): Payer: Medicare Other | Admitting: Nurse Practitioner

## 2018-02-04 ENCOUNTER — Encounter: Payer: Self-pay | Admitting: Nurse Practitioner

## 2018-02-04 VITALS — BP 130/60 | HR 68 | Ht 66.0 in | Wt 145.1 lb

## 2018-02-04 DIAGNOSIS — Z79899 Other long term (current) drug therapy: Secondary | ICD-10-CM | POA: Diagnosis not present

## 2018-02-04 DIAGNOSIS — I10 Essential (primary) hypertension: Secondary | ICD-10-CM

## 2018-02-04 DIAGNOSIS — I2510 Atherosclerotic heart disease of native coronary artery without angina pectoris: Secondary | ICD-10-CM

## 2018-02-04 DIAGNOSIS — Z7901 Long term (current) use of anticoagulants: Secondary | ICD-10-CM | POA: Diagnosis not present

## 2018-02-04 DIAGNOSIS — I5042 Chronic combined systolic (congestive) and diastolic (congestive) heart failure: Secondary | ICD-10-CM
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
## 2018-02-04 NOTE — Patient Instructions (Addendum)
We will be checking the following labs today - NONE   Medication Instructions:    Continue with your current medicines.     Testing/Procedures To Be Arranged:  N/A  Follow-Up:   See me in 4 months    Other Special Instructions:   Keep up the good work!    If you need a refill on your cardiac medications before your next appointment, please call your pharmacy.   Call the [place] office at [phone] if you have any questions, problems or concerns.

## 2018-02-05 ENCOUNTER — Telehealth: Payer: Self-pay | Admitting: *Deleted

## 2018-02-05 ENCOUNTER — Ambulatory Visit: Payer: Self-pay | Admitting: Nurse Practitioner

## 2018-02-05 NOTE — Telephone Encounter (Signed)
S/w pt to schedule ov appt in 4 months.  Will mail calendar to pt's confirmed address. In mail today.

## 2018-02-06 ENCOUNTER — Telehealth: Payer: Self-pay | Admitting: *Deleted

## 2018-02-06 DIAGNOSIS — M810 Age-related osteoporosis without current pathological fracture: Secondary | ICD-10-CM

## 2018-02-06 NOTE — Telephone Encounter (Signed)
Patient calling asking when she's due for the next bone density, last bone density was 03/15/16. Please advise  ** pt does not and will not take Prolia

## 2018-02-06 NOTE — Telephone Encounter (Signed)
After 03/15/18.

## 2018-02-07 NOTE — Addendum Note (Signed)
Addended by: Hollace Kinnier L on: 02/07/2018 02:00 PM   Modules accepted: Orders

## 2018-02-07 NOTE — Telephone Encounter (Signed)
Spoke with patient and advised results, bone density ordered

## 2018-02-10 ENCOUNTER — Other Ambulatory Visit: Payer: Self-pay | Admitting: Internal Medicine

## 2018-02-12 ENCOUNTER — Other Ambulatory Visit: Payer: Self-pay | Admitting: Internal Medicine

## 2018-02-12 NOTE — Telephone Encounter (Signed)
A medication refill was received from pharmacy for alprazolam 0.25 mg. Rx was called in to pharmacy after verifying last fill date, provider, and quantity on [place]MP [place] database.   

## 2018-02-25 ENCOUNTER — Non-Acute Institutional Stay: Payer: Medicare Other

## 2018-02-25 VITALS — BP 120/58 | HR 112 | Temp 97.7°F | Ht 66.0 in | Wt 147.0 lb

## 2018-02-25 DIAGNOSIS — Z0000 Encounter for general adult medical examination without abnormal findings: Secondary | ICD-10-CM | POA: Diagnosis not present

## 2018-02-25 MED ORDER — TETANUS-DIPHTH-ACELL PERTUSSIS 5-2.5-18.5 LF-MCG/0.5 IM SUSP: 0.5000 mL | Freq: Once | INTRAMUSCULAR | 0 refills | Status: AC

## 2018-02-25 MED ORDER — ZOSTER VAC RECOMB ADJUVANTED 50 MCG/0.5ML IM SUSR: 0.5000 mL | Freq: Once | INTRAMUSCULAR | 1 refills | Status: AC

## 2018-02-25 MED FILL — BOOSTRIX VACCINE SYRINGE: 5-2.5-18.5 | 1 days supply | Qty: 1 | Fill #0

## 2018-02-25 MED FILL — SHINGRIX VIAL KIT: 50 | 1 days supply | Qty: 1 | Fill #0

## 2018-02-25 NOTE — Patient Instructions (Signed)
Debra Brooks , Thank you for taking time to come for your Medicare Wellness Visit. I appreciate your ongoing commitment to your health goals. Please review the following plan we discussed and let me know if I can assist you in the future.   Screening recommendations/referrals: Colonoscopy excluded Mammogram excluded Bone Density up to date, due 03/15/2018 Recommended yearly ophthalmology/optometry visit for glaucoma screening and checkup Recommended yearly dental visit for hygiene and checkup  Vaccinations: Influenza vaccine up to date, due 2019 fall season Pneumococcal vaccine up to date, completed Tdap vaccine due, ordered Shingles vaccine due, ordered    Advanced directives: in chart  Conditions/risks identified: none  Next appointment: Dr. Mariea Clonts 05/14/2018 @ 2pm   Preventive Care 82 Years and Older, Female Preventive care refers to lifestyle choices and visits with your health care provider that can promote health and wellness. What does preventive care include?  A yearly physical exam. This is also called an annual well check.  Dental exams once or twice a year.  Routine eye exams. Ask your health care provider how often you should have your eyes checked.  Personal lifestyle choices, including:  Daily care of your teeth and gums.  Regular physical activity.  Eating a healthy diet.  Avoiding tobacco and drug use.  Limiting alcohol use.  Practicing safe sex.  Taking low-dose aspirin every day.  Taking vitamin and mineral supplements as recommended by your health care provider. What happens during an annual well check? The services and screenings done by your health care provider during your annual well check will depend on your age, overall health, lifestyle risk factors, and family history of disease. Counseling  Your health care provider may ask you questions about your:  Alcohol use.  Tobacco use.  Drug use.  Emotional well-being.  Home and relationship  well-being.  Sexual activity.  Eating habits.  History of falls.  Memory and ability to understand (cognition).  Work and work Statistician.  Reproductive health. Screening  You may have the following tests or measurements:  Height, weight, and BMI.  Blood pressure.  Lipid and cholesterol levels. These may be checked every 5 years, or more frequently if you are over 21 years old.  Skin check.  Lung cancer screening. You may have this screening every year starting at age 61 if you have a 30-pack-year history of smoking and currently smoke or have quit within the past 15 years.  Fecal occult blood test (FOBT) of the stool. You may have this test every year starting at age 56.  Flexible sigmoidoscopy or colonoscopy. You may have a sigmoidoscopy every 5 years or a colonoscopy every 10 years starting at age 70.  Hepatitis C blood test.  Hepatitis B blood test.  Sexually transmitted disease (STD) testing.  Diabetes screening. This is done by checking your blood sugar (glucose) after you have not eaten for a while (fasting). You may have this done every 1-3 years.  Bone density scan. This is done to screen for osteoporosis. You may have this done starting at age 53.  Mammogram. This may be done every 1-2 years. Talk to your health care provider about how often you should have regular mammograms. Talk with your health care provider about your test results, treatment options, and if necessary, the need for more tests. Vaccines  Your health care provider may recommend certain vaccines, such as:  Influenza vaccine. This is recommended every year.  Tetanus, diphtheria, and acellular pertussis (Tdap, Td) vaccine. You may need a Td booster  every 10 years.  Zoster vaccine. You may need this after age 22.  Pneumococcal 13-valent conjugate (PCV13) vaccine. One dose is recommended after age 14.  Pneumococcal polysaccharide (PPSV23) vaccine. One dose is recommended after age  82. Talk to your health care provider about which screenings and vaccines you need and how often you need them. This information is not intended to replace advice given to you by your health care provider. Make sure you discuss any questions you have with your health care provider. Document Released: 11/11/2015 Document Revised: 07/04/2016 Document Reviewed: 08/16/2015 Elsevier Interactive Patient Education  2017 [place] Prevention in the Home Falls can cause injuries. They can happen to people of all ages. There are many things you can do to make your home safe and to help prevent falls. What can I do on the outside of my home?  Regularly fix the edges of walkways and driveways and fix any cracks.  Remove anything that might make you trip as you walk through a door, such as a raised step or threshold.  Trim any bushes or trees on the path to your home.  Use bright outdoor lighting.  Clear any walking paths of anything that might make someone trip, such as rocks or tools.  Regularly check to see if handrails are loose or broken. Make sure that both sides of any steps have handrails.  Any raised decks and porches should have guardrails on the edges.  Have any leaves, snow, or ice cleared regularly.  Use sand or salt on walking paths during winter.  Clean up any spills in your garage right away. This includes oil or grease spills. What can I do in the bathroom?  Use night lights.  Install grab bars by the toilet and in the tub and shower. Do not use towel bars as grab bars.  Use non-skid mats or decals in the tub or shower.  If you need to sit down in the shower, use a plastic, non-slip stool.  Keep the floor dry. Clean up any water that spills on the floor as soon as it happens.  Remove soap buildup in the tub or shower regularly.  Attach bath mats securely with double-sided non-slip rug tape.  Do not have throw rugs and other things on the floor that can make  you trip. What can I do in the bedroom?  Use night lights.  Make sure that you have a light by your bed that is easy to reach.  Do not use any sheets or blankets that are too big for your bed. They should not hang down onto the floor.  Have a firm chair that has side arms. You can use this for support while you get dressed.  Do not have throw rugs and other things on the floor that can make you trip. What can I do in the kitchen?  Clean up any spills right away.  Avoid walking on wet floors.  Keep items that you use a lot in easy-to-reach places.  If you need to reach something above you, use a strong step stool that has a grab bar.  Keep electrical cords out of the way.  Do not use floor polish or wax that makes floors slippery. If you must use wax, use non-skid floor wax.  Do not have throw rugs and other things on the floor that can make you trip. What can I do with my stairs?  Do not leave any items on the stairs.  Make  sure that there are handrails on both sides of the stairs and use them. Fix handrails that are broken or loose. Make sure that handrails are as long as the stairways.  Check any carpeting to make sure that it is firmly attached to the stairs. Fix any carpet that is loose or worn.  Avoid having throw rugs at the top or bottom of the stairs. If you do have throw rugs, attach them to the floor with carpet tape.  Make sure that you have a light switch at the top of the stairs and the bottom of the stairs. If you do not have them, ask someone to add them for you. What else can I do to help prevent falls?  Wear shoes that:  Do not have high heels.  Have rubber bottoms.  Are comfortable and fit you well.  Are closed at the toe. Do not wear sandals.  If you use a stepladder:  Make sure that it is fully opened. Do not climb a closed stepladder.  Make sure that both sides of the stepladder are locked into place.  Ask someone to hold it for you, if  possible.  Clearly mark and make sure that you can see:  Any grab bars or handrails.  First and last steps.  Where the edge of each step is.  Use tools that help you move around (mobility aids) if they are needed. These include:  Canes.  Walkers.  Scooters.  Crutches.  Turn on the lights when you go into a dark area. Replace any light bulbs as soon as they burn out.  Set up your furniture so you have a clear path. Avoid moving your furniture around.  If any of your floors are uneven, fix them.  If there are any pets around you, be aware of where they are.  Review your medicines with your doctor. Some medicines can make you feel dizzy. This can increase your chance of falling. Ask your doctor what other things that you can do to help prevent falls. This information is not intended to replace advice given to you by your health care provider. Make sure you discuss any questions you have with your health care provider. Document Released: 08/11/2009 Document Revised: 03/22/2016 Document Reviewed: 11/19/2014 Elsevier Interactive Patient Education  2017 [employer].
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
## 2018-03-12 ENCOUNTER — Other Ambulatory Visit: Payer: Self-pay | Admitting: Internal Medicine

## 2018-03-12 ENCOUNTER — Other Ambulatory Visit: Payer: Self-pay | Admitting: Cardiology

## 2018-03-12 DIAGNOSIS — K219 Gastro-esophageal reflux disease without esophagitis: Secondary | ICD-10-CM

## 2018-03-12 NOTE — Telephone Encounter (Signed)
Eliquis 5mg  refill request received; pt is 84 yrs old, wt-65.8kg, Crea-1.0 on 01/02/18, last seen by Truitt Merle on 02/04/18; will send in refill to requested Pharmacy.

## 2018-03-14 ENCOUNTER — Other Ambulatory Visit: Payer: Self-pay | Admitting: Internal Medicine

## 2018-04-10 ENCOUNTER — Other Ambulatory Visit: Payer: Self-pay | Admitting: Internal Medicine

## 2018-04-11 ENCOUNTER — Telehealth: Payer: Self-pay | Admitting: *Deleted

## 2018-04-11 NOTE — Telephone Encounter (Signed)
Pt calling in today to check appt date and time.  Was moved to the following week.

## 2018-04-13 ENCOUNTER — Other Ambulatory Visit: Payer: Self-pay | Admitting: Internal Medicine

## 2018-04-15 ENCOUNTER — Other Ambulatory Visit: Payer: Self-pay | Admitting: Internal Medicine

## 2018-04-15 NOTE — Telephone Encounter (Signed)
A medication refill was received from pharmacy for alprazolam 0.25 mg. Rx was called in to pharmacy after verifying last fill date, provider, and quantity on [place]MP [place] database.   

## 2018-04-15 NOTE — Telephone Encounter (Signed)
rx called into pharmacy

## 2018-05-01 MED FILL — SHINGRIX VIAL KIT: 50 | 1 days supply | Qty: 1 | Fill #1

## 2018-05-08 DIAGNOSIS — K5901 Slow transit constipation: Secondary | ICD-10-CM | POA: Diagnosis not present

## 2018-05-08 DIAGNOSIS — D509 Iron deficiency anemia, unspecified: Secondary | ICD-10-CM | POA: Diagnosis not present

## 2018-05-08 DIAGNOSIS — R195 Other fecal abnormalities: Secondary | ICD-10-CM | POA: Diagnosis not present

## 2018-05-08 DIAGNOSIS — K52832 Lymphocytic colitis: Secondary | ICD-10-CM | POA: Diagnosis not present

## 2018-05-08 DIAGNOSIS — K551 Chronic vascular disorders of intestine: Secondary | ICD-10-CM | POA: Diagnosis not present

## 2018-05-13 ENCOUNTER — Other Ambulatory Visit: Payer: Self-pay | Admitting: Internal Medicine

## 2018-05-13 DIAGNOSIS — K219 Gastro-esophageal reflux disease without esophagitis: Secondary | ICD-10-CM

## 2018-05-14 ENCOUNTER — Non-Acute Institutional Stay: Payer: Medicare Other | Admitting: Internal Medicine

## 2018-05-14 ENCOUNTER — Encounter: Payer: Self-pay | Admitting: Internal Medicine

## 2018-05-14 VITALS — BP 132/62 | HR 77 | Temp 98.1°F | Ht 66.0 in | Wt 148.0 lb

## 2018-05-14 DIAGNOSIS — I5042 Chronic combined systolic (congestive) and diastolic (congestive) heart failure: Secondary | ICD-10-CM

## 2018-05-14 DIAGNOSIS — I2510 Atherosclerotic heart disease of native coronary artery without angina pectoris: Secondary | ICD-10-CM

## 2018-05-14 DIAGNOSIS — J439 Emphysema, unspecified: Secondary | ICD-10-CM

## 2018-05-14 DIAGNOSIS — N183 Chronic kidney disease, stage 3 unspecified: Secondary | ICD-10-CM

## 2018-05-14 DIAGNOSIS — M810 Age-related osteoporosis without current pathological fracture: Secondary | ICD-10-CM | POA: Diagnosis not present

## 2018-05-14 DIAGNOSIS — E039 Hypothyroidism, unspecified: Secondary | ICD-10-CM

## 2018-05-14 DIAGNOSIS — I482 Chronic atrial fibrillation, unspecified: Secondary | ICD-10-CM

## 2018-05-14 DIAGNOSIS — G609 Hereditary and idiopathic neuropathy, unspecified: Secondary | ICD-10-CM | POA: Diagnosis not present
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
## 2018-05-17 ENCOUNTER — Other Ambulatory Visit: Payer: Self-pay | Admitting: Internal Medicine

## 2018-05-20 DIAGNOSIS — H18423 Band keratopathy, bilateral: Secondary | ICD-10-CM | POA: Diagnosis not present

## 2018-05-20 DIAGNOSIS — Z961 Presence of intraocular lens: Secondary | ICD-10-CM | POA: Diagnosis not present

## 2018-05-20 DIAGNOSIS — H353221 Exudative age-related macular degeneration, left eye, with active choroidal neovascularization: Secondary | ICD-10-CM | POA: Diagnosis not present

## 2018-05-20 DIAGNOSIS — H353114 Nonexudative age-related macular degeneration, right eye, advanced atrophic with subfoveal involvement: Secondary | ICD-10-CM | POA: Diagnosis not present

## 2018-05-20 DIAGNOSIS — H35372 Puckering of macula, left eye: Secondary | ICD-10-CM | POA: Diagnosis not present

## 2018-05-20 DIAGNOSIS — H43813 Vitreous degeneration, bilateral: Secondary | ICD-10-CM | POA: Diagnosis not present

## 2018-06-05 ENCOUNTER — Telehealth: Payer: Self-pay | Admitting: *Deleted

## 2018-06-05 NOTE — Telephone Encounter (Signed)
Received fax from [facility] to [facility] [phone] requesting order to be signed for Mastectomy Supplies.  Silicone Breast Prosthesis 2 Mastectomy Bras 6 Placed fax in Dr. Cyndi Lennert folder to review and sign. To be faxed back to: [phone]

## 2018-06-09 ENCOUNTER — Other Ambulatory Visit: Payer: Self-pay | Admitting: Nurse Practitioner

## 2018-06-09 ENCOUNTER — Other Ambulatory Visit: Payer: Self-pay | Admitting: Internal Medicine

## 2018-06-11 ENCOUNTER — Other Ambulatory Visit: Payer: Self-pay | Admitting: Internal Medicine

## 2018-06-12 ENCOUNTER — Other Ambulatory Visit: Payer: Self-pay | Admitting: Internal Medicine

## 2018-06-13 ENCOUNTER — Other Ambulatory Visit: Payer: Self-pay | Admitting: Internal Medicine

## 2018-06-14 ENCOUNTER — Other Ambulatory Visit: Payer: Self-pay | Admitting: Internal Medicine

## 2018-06-17 ENCOUNTER — Other Ambulatory Visit: Payer: Self-pay | Admitting: *Deleted

## 2018-06-17 ENCOUNTER — Ambulatory Visit: Payer: Self-pay | Admitting: Nurse Practitioner

## 2018-06-17 MED ORDER — PREGABALIN 50 MG PO CAPS: 50.0000 mg | ORAL_CAPSULE | Freq: Every day | ORAL | 0 refills | Status: DC

## 2018-06-23 DIAGNOSIS — L821 Other seborrheic keratosis: Secondary | ICD-10-CM | POA: Diagnosis not present

## 2018-06-23 DIAGNOSIS — L570 Actinic keratosis: Secondary | ICD-10-CM | POA: Diagnosis not present

## 2018-06-24 ENCOUNTER — Encounter

## 2018-06-24 ENCOUNTER — Ambulatory Visit (INDEPENDENT_AMBULATORY_CARE_PROVIDER_SITE_OTHER): Payer: Medicare Other | Admitting: Nurse Practitioner

## 2018-06-24 ENCOUNTER — Encounter: Payer: Self-pay | Admitting: Nurse Practitioner

## 2018-06-24 VITALS — BP 118/60 | HR 76 | Ht 66.0 in | Wt 149.8 lb

## 2018-06-24 DIAGNOSIS — Z7901 Long term (current) use of anticoagulants: Secondary | ICD-10-CM | POA: Diagnosis not present

## 2018-06-24 DIAGNOSIS — I481 Persistent atrial fibrillation: Secondary | ICD-10-CM | POA: Diagnosis not present

## 2018-06-24 DIAGNOSIS — I5032 Chronic diastolic (congestive) heart failure: Secondary | ICD-10-CM | POA: Diagnosis not present

## 2018-06-24 DIAGNOSIS — I2510 Atherosclerotic heart disease of native coronary artery without angina pectoris: Secondary | ICD-10-CM | POA: Diagnosis not present

## 2018-06-24 DIAGNOSIS — I10 Essential (primary) hypertension: Secondary | ICD-10-CM

## 2018-06-24 DIAGNOSIS — Z79899 Other long term (current) drug therapy: Secondary | ICD-10-CM | POA: Diagnosis not present

## 2018-06-24 DIAGNOSIS — I4819 Other persistent atrial fibrillation: Secondary | ICD-10-CM
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
## 2018-06-24 NOTE — Patient Instructions (Signed)
We will be checking the following labs today - NONE   Medication Instructions:    Continue with your current medicines.     Testing/Procedures To Be Arranged:  N/A  Follow-Up:   See me in 4 months    Other Special Instructions:   N/A    If you need a refill on your cardiac medications before your next appointment, please call your pharmacy.   Call the [facility] office at [phone] if you have any questions, problems or concerns.      

## 2018-07-06 IMAGING — DX DG CHEST 2V
2 series · 2 of 2 positions shown · non-contrast
Comparison: 10/17/2017

CLINICAL DATA: Tachycardia.  Recent diagnosis of pneumonia.

EXAM:
CHEST  2 VIEW

[chest lat]
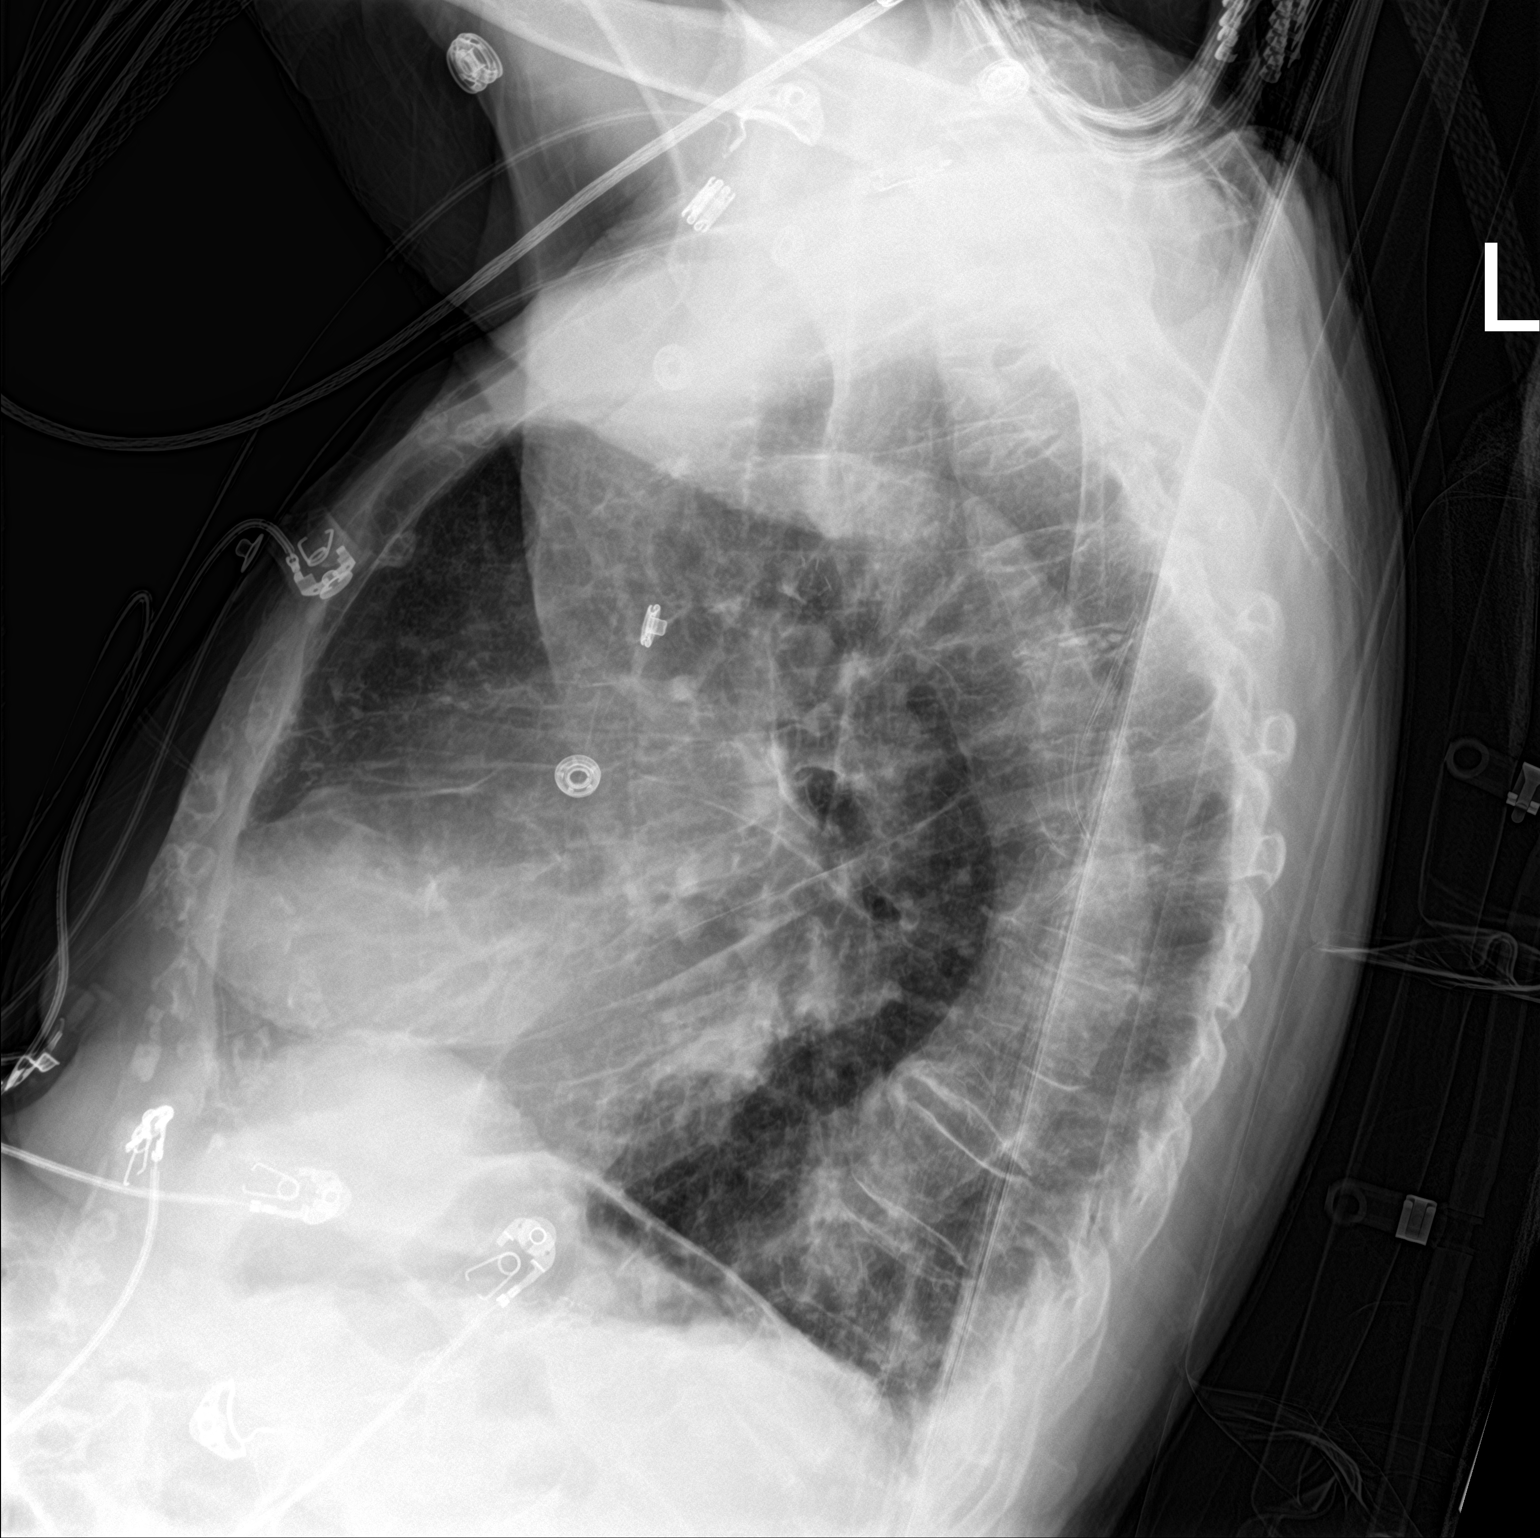

[chest ap]
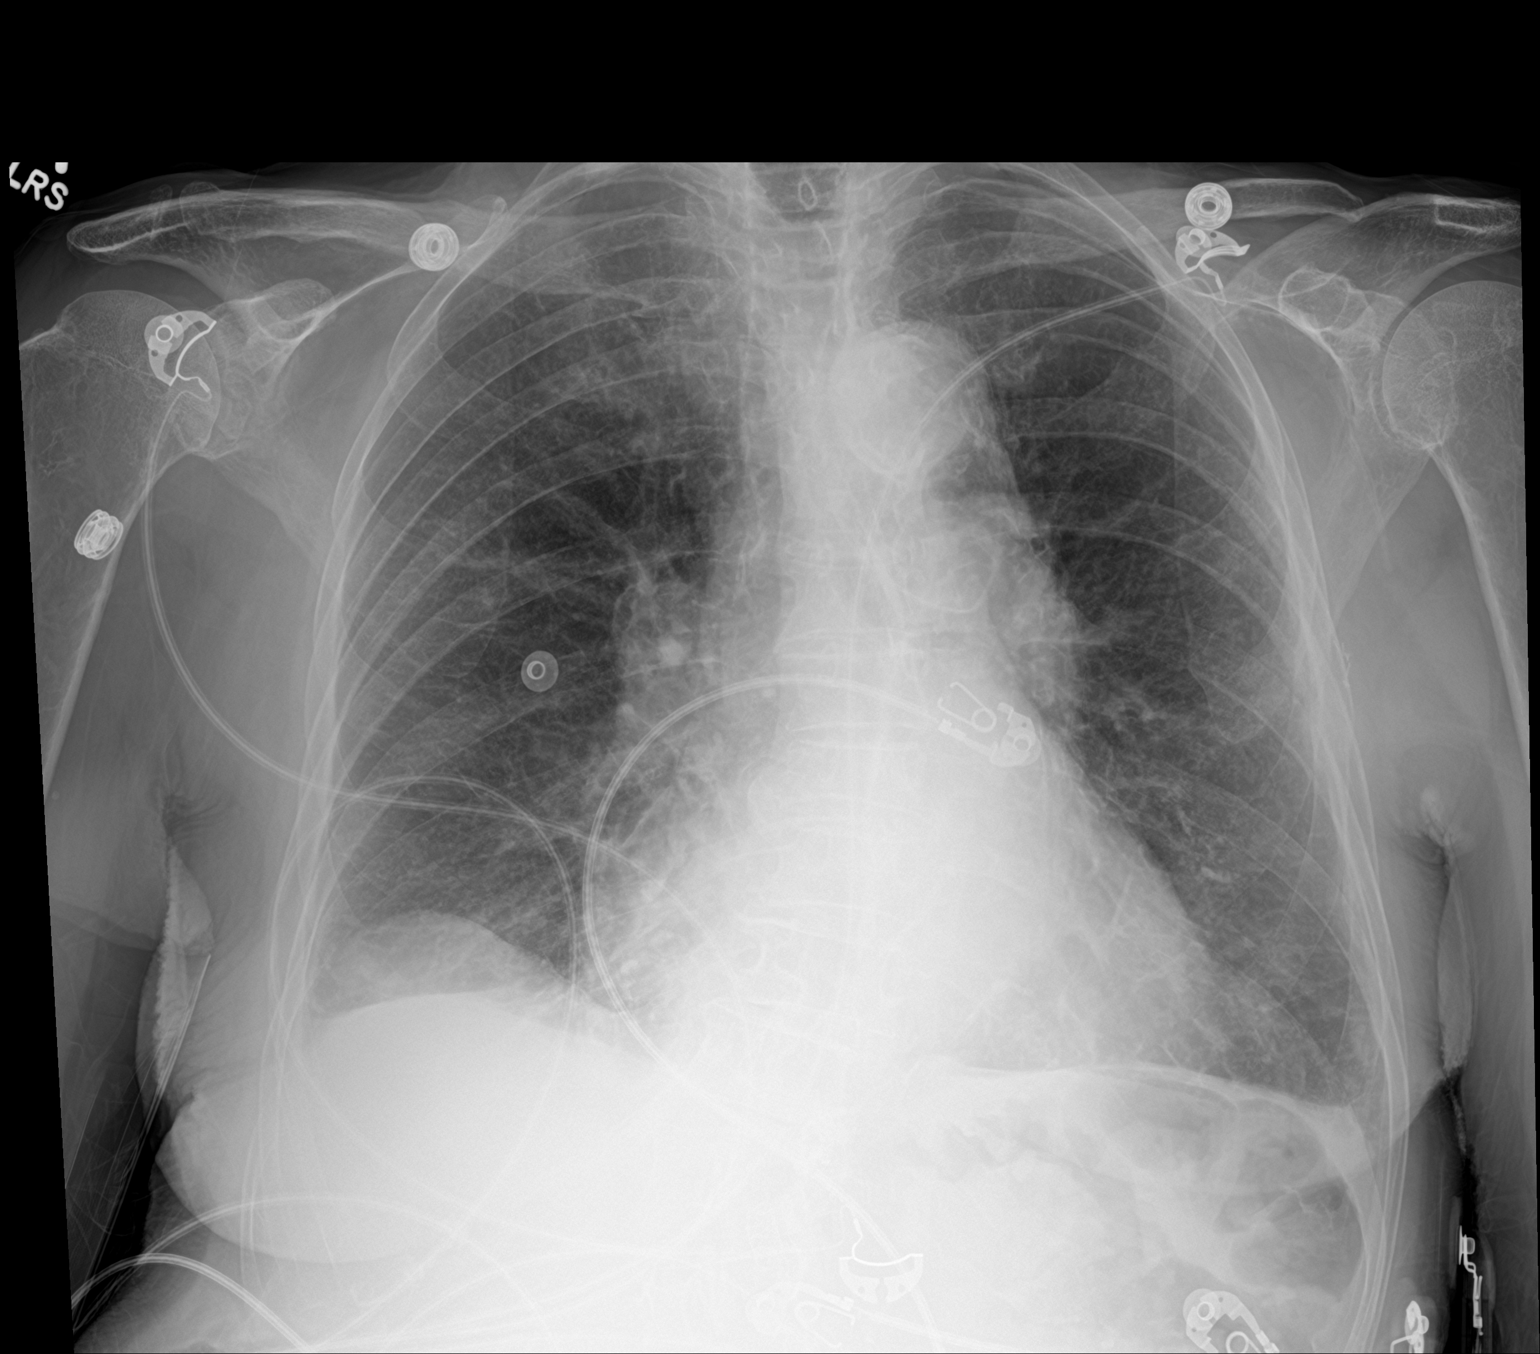

[2 of 2 positions shown; findings below may reference images not displayed]

FINDINGS: Stable cardiomegaly and tortuous atherosclerotic aorta. Mild
vascular congestion. Fluid in the fissures without large subpulmonic
effusion. No consolidation or pneumothorax. No acute osseous
abnormalities are seen. Chronic compression deformity in the mid
lower thoracic spine. Truncated distal left clavicle may be
postsurgical or resorption.
IMPRESSION: Cardiomegaly with vascular congestion. Minimal fissure oral fluid.
Findings suggest mild fluid overload.

## 2018-07-06 IMAGING — CT CT CHEST W/O CM
2 of 4 series · 15 of 36 positions shown, 18 images · non-contrast
Comparison: Chest x-ray October 31, 2016.  Chest CT November 06, 2014.

CLINICAL DATA: Cough for 3 weeks. Today's chest x-ray suggested
fluid overload.

EXAM:
CT CHEST WITHOUT CONTRAST
TECHNIQUE: Multidetector CT imaging of the chest was performed following the
standard protocol without IV contrast.

[Series 3: chest wo · axial · 0.62mm/px · z∈[+496,+768]mm · 12 of 162 slices shown, 15 images]
[im 13/162  mediastinal]
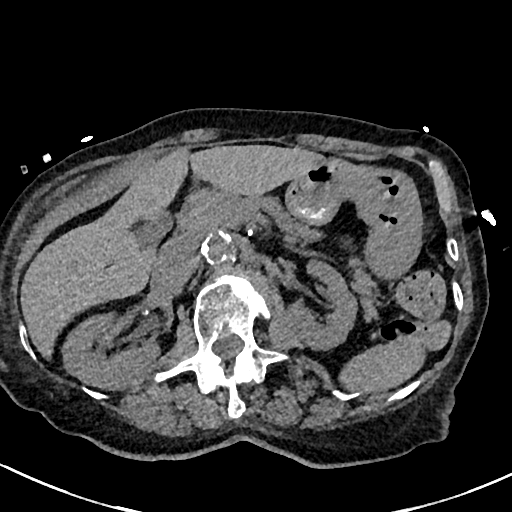
[im 13/162  lung]
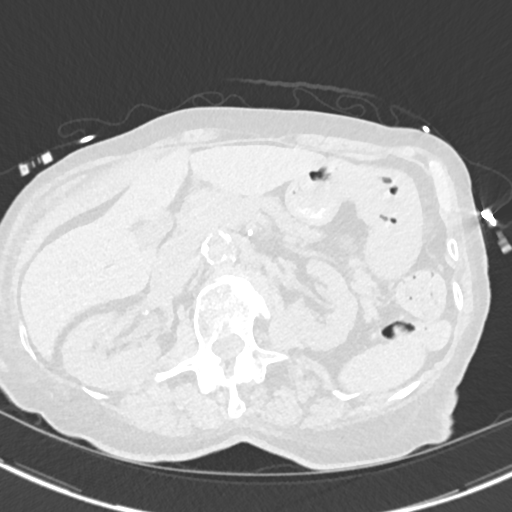
[im 25/162  lung]
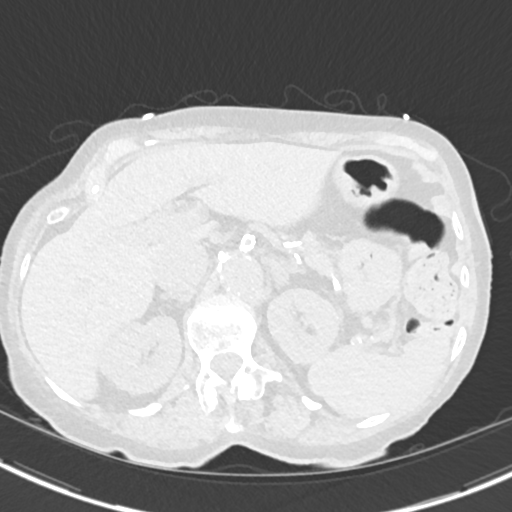
[im 38/162  lung]
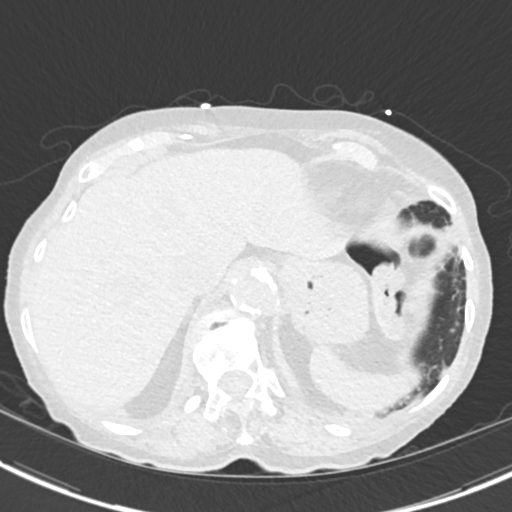
[im 50/162  lung]
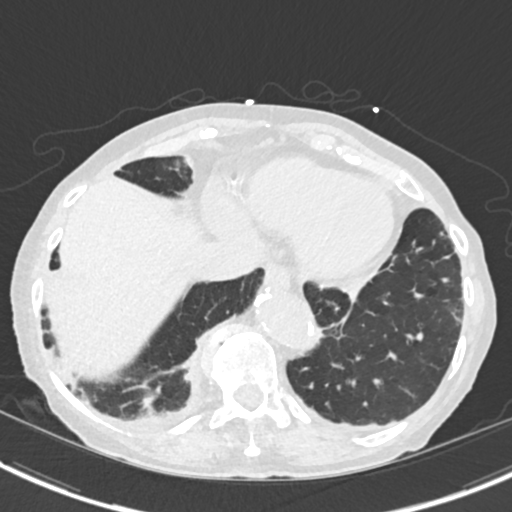
[im 62/162  mediastinal]
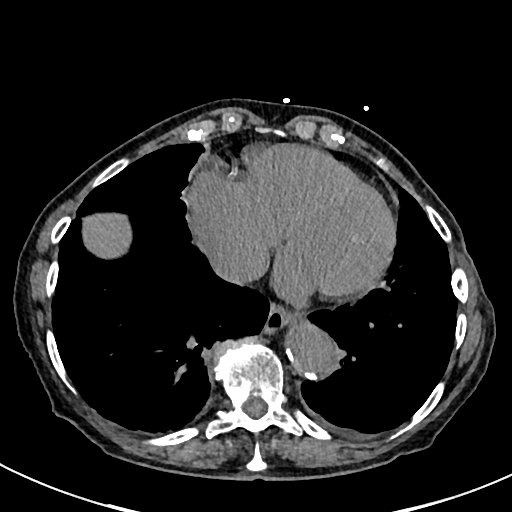
[im 62/162  lung]
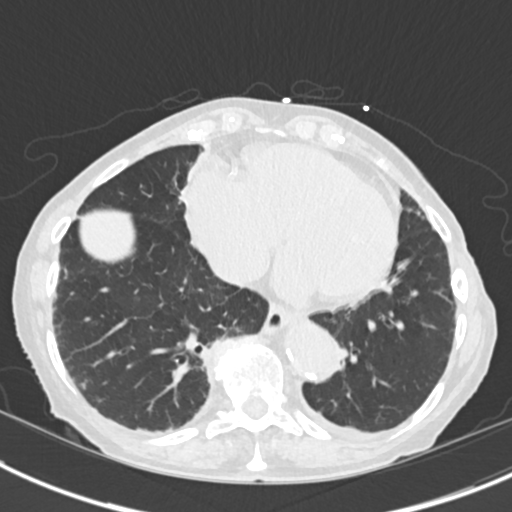
[im 75/162  lung]
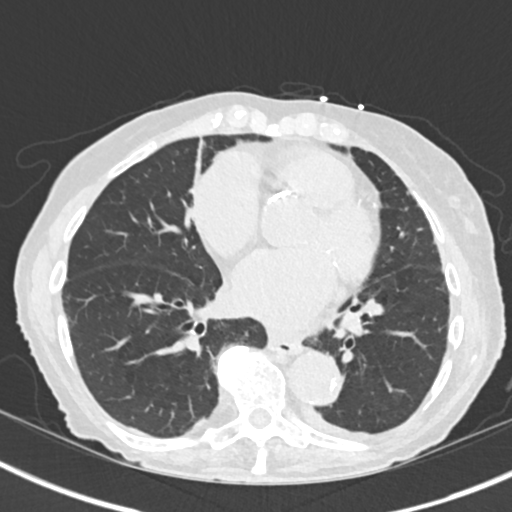
[im 87/162  lung]
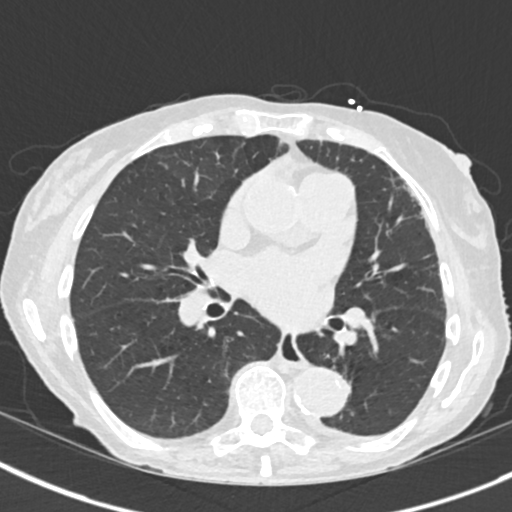
[im 100/162  lung]
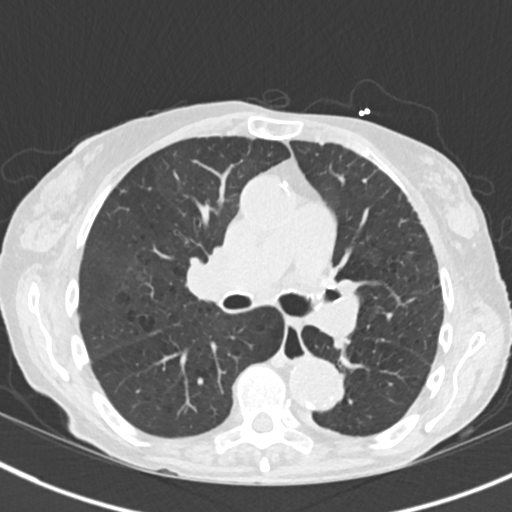
[im 112/162  mediastinal]
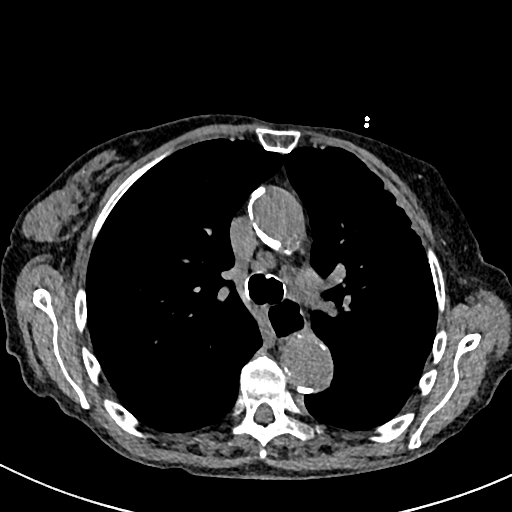
[im 112/162  lung]
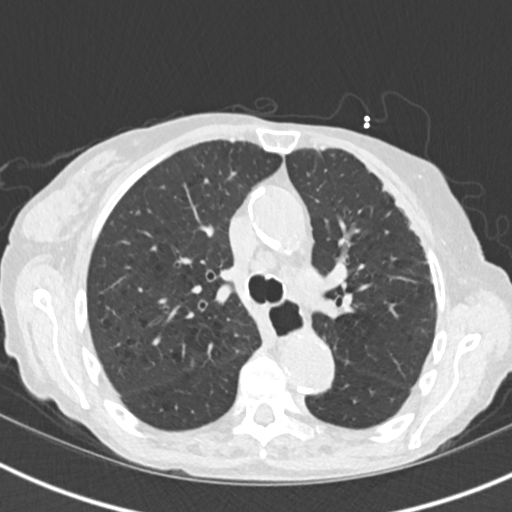
[im 124/162  lung]
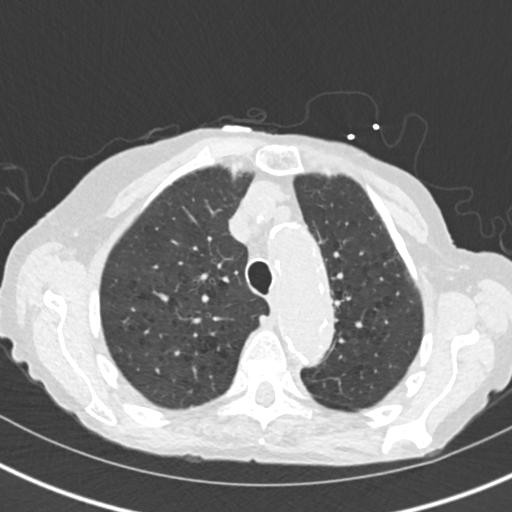
[im 137/162  lung]
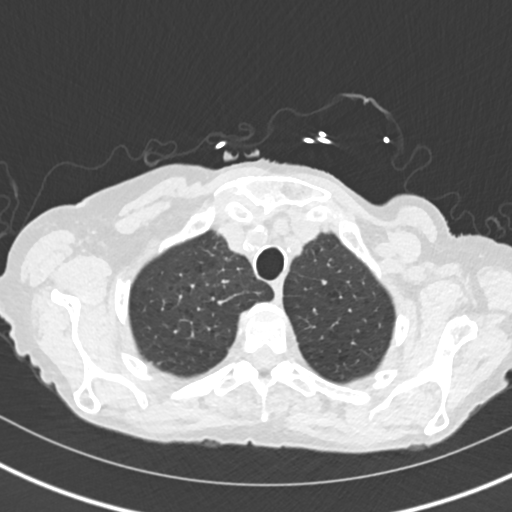
[im 149/162  lung]
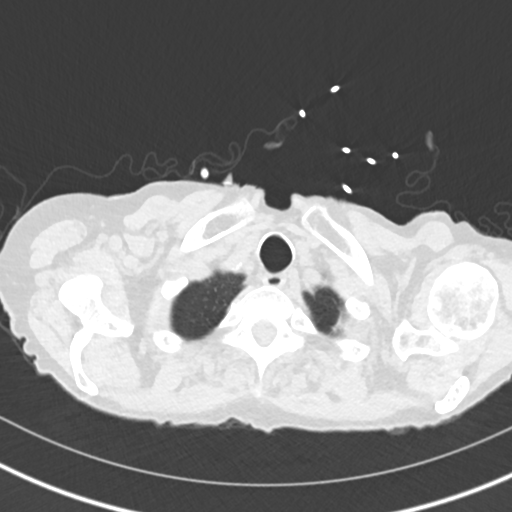

[Series 6: cor · coronal · 0.63mm/px · 3 of 151 slices shown]
[im 31/151  lung]
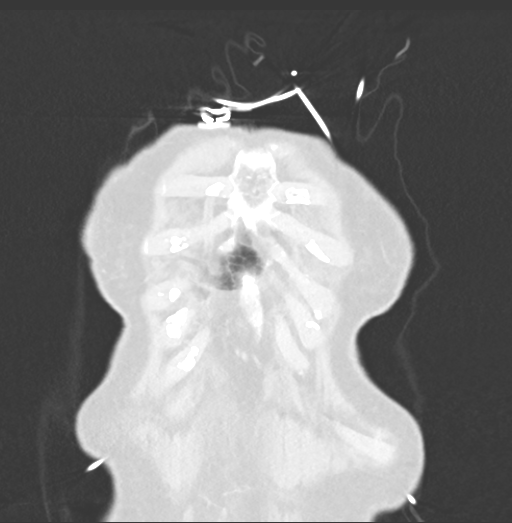
[im 61/151  lung]
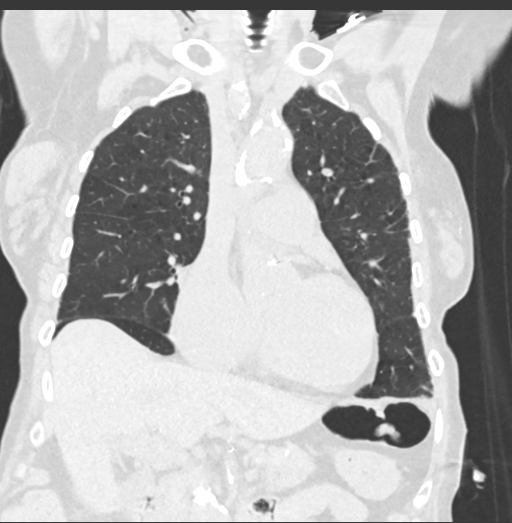
[im 91/151  lung]
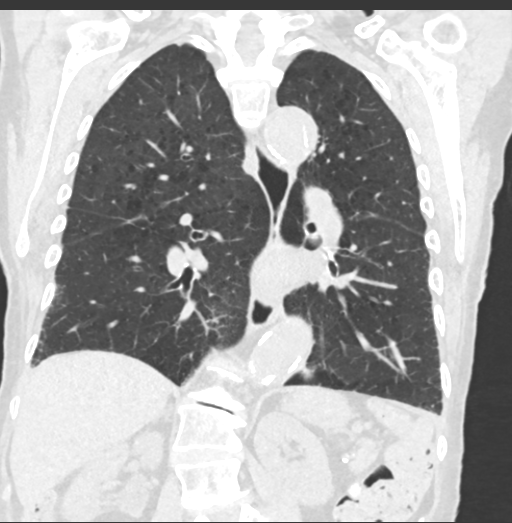

[15 of 36 positions shown; findings below may reference images not displayed]

FINDINGS: Cardiovascular: The thoracic aorta is atherosclerotic but
nonaneurysmal. The central pulmonary artery is normal. Mild
cardiomegaly. Coronary artery calcifications.

Mediastinum/Nodes: The thyroid and esophagus are unremarkable. Tiny
pleural effusions. No pericardial effusion. Shotty nodes are seen in
the mediastinum, likely reactive with no gross adenopathy. No other
adenopathy in the chest.

Lungs/Pleura: Central airways are normal. No pneumothorax. Mild
scattered subsegmental atelectasis. Emphysematous changes seen in
the lungs. No suspicious nodules or masses. No over pulmonary edema.
No evidence of pneumonia or aspiration.

Upper Abdomen: No acute abnormality.

Musculoskeletal: Anterior wedging of a midthoracic vertebral body is
stable since October 2014. No acute bony abnormalities.
IMPRESSION: 1. Tiny pleural effusions and atelectasis. No other cause for cough
identified. No volume overload.
2. Thoracic aorta atherosclerotic change. No aneurysm. Coronary
artery calcifications.
3. Emphysema.
4. Stable compression fracture of a midthoracic vertebral body,
unchanged since 2580.

Aortic Atherosclerosis (AYA03-R98.8) and Emphysema (AYA03-KLG.8).

## 2018-07-14 ENCOUNTER — Other Ambulatory Visit: Payer: Self-pay | Admitting: Internal Medicine

## 2018-07-17 ENCOUNTER — Other Ambulatory Visit: Payer: Self-pay | Admitting: Internal Medicine

## 2018-07-18 ENCOUNTER — Other Ambulatory Visit: Payer: Self-pay | Admitting: Internal Medicine

## 2018-07-18 NOTE — Telephone Encounter (Signed)
[place] Database verified and compliance confirmed   

## 2018-07-31 DIAGNOSIS — L821 Other seborrheic keratosis: Secondary | ICD-10-CM | POA: Diagnosis not present

## 2018-07-31 DIAGNOSIS — L814 Other melanin hyperpigmentation: Secondary | ICD-10-CM | POA: Diagnosis not present

## 2018-07-31 DIAGNOSIS — D1801 Hemangioma of skin and subcutaneous tissue: Secondary | ICD-10-CM | POA: Diagnosis not present

## 2018-08-04 ENCOUNTER — Other Ambulatory Visit: Payer: Self-pay | Admitting: Internal Medicine

## 2018-08-14 ENCOUNTER — Other Ambulatory Visit: Payer: Self-pay | Admitting: Internal Medicine

## 2018-08-14 ENCOUNTER — Other Ambulatory Visit: Payer: Self-pay | Admitting: *Deleted

## 2018-08-14 MED ORDER — PREGABALIN 50 MG PO CAPS: ORAL_CAPSULE | ORAL | 0 refills | Status: DC

## 2018-08-14 NOTE — Telephone Encounter (Signed)
[facility]  

## 2018-08-22 DIAGNOSIS — Z23 Encounter for immunization: Secondary | ICD-10-CM | POA: Diagnosis not present

## 2018-09-08 ENCOUNTER — Other Ambulatory Visit: Payer: Self-pay | Admitting: Internal Medicine

## 2018-09-16 ENCOUNTER — Other Ambulatory Visit: Payer: Self-pay | Admitting: Internal Medicine

## 2018-09-16 MED ORDER — ALPRAZOLAM 0.25 MG PO TABS: ORAL_TABLET | ORAL | 0 refills | Status: DC

## 2018-09-16 NOTE — Telephone Encounter (Signed)
A medication refill was received from pharmacy for lyrica 50 mg and alprazolam 0.25 mg . Rx was pended to provider for approval  after verifying last fill date, provider, and quantity on PMP [place]

## 2018-09-23 DIAGNOSIS — D649 Anemia, unspecified: Secondary | ICD-10-CM | POA: Diagnosis not present

## 2018-09-23 DIAGNOSIS — E039 Hypothyroidism, unspecified: Secondary | ICD-10-CM | POA: Diagnosis not present

## 2018-09-23 DIAGNOSIS — I10 Essential (primary) hypertension: Secondary | ICD-10-CM | POA: Diagnosis not present

## 2018-09-23 LAB — CBC AND DIFFERENTIAL
HCT: 37 (ref 36–46)
Hemoglobin: 12.7 (ref 12.0–16.0)
Platelets: 216 (ref 150–399)
WBC: 5.4

## 2018-09-23 LAB — BASIC METABOLIC PANEL
BUN: 26 — AB (ref 4–21)
Creatinine: 1 (ref 0.5–1.1)
Glucose: 96
Potassium: 3.8 (ref 3.4–5.3)
Sodium: 144 (ref 137–147)

## 2018-09-23 LAB — TSH: TSH: 1.39 (ref 0.41–5.90)

## 2018-09-23 LAB — HEPATIC FUNCTION PANEL
ALT: 12 (ref 7–35)
AST: 16 (ref 13–35)
Alkaline Phosphatase: 66 (ref 25–125)
Bilirubin, Total: 0.4

## 2018-09-24 ENCOUNTER — Non-Acute Institutional Stay: Payer: Medicare Other | Admitting: Internal Medicine

## 2018-09-24 ENCOUNTER — Encounter: Payer: Self-pay | Admitting: Internal Medicine

## 2018-09-24 VITALS — BP 132/70 | HR 75 | Temp 97.6°F | Ht 66.0 in | Wt 144.0 lb

## 2018-09-24 DIAGNOSIS — H353221 Exudative age-related macular degeneration, left eye, with active choroidal neovascularization: Secondary | ICD-10-CM

## 2018-09-24 DIAGNOSIS — H353114 Nonexudative age-related macular degeneration, right eye, advanced atrophic with subfoveal involvement: Secondary | ICD-10-CM

## 2018-09-24 DIAGNOSIS — I5042 Chronic combined systolic (congestive) and diastolic (congestive) heart failure: Secondary | ICD-10-CM | POA: Diagnosis not present

## 2018-09-24 DIAGNOSIS — J440 Chronic obstructive pulmonary disease with acute lower respiratory infection: Secondary | ICD-10-CM

## 2018-09-24 DIAGNOSIS — N183 Chronic kidney disease, stage 3 unspecified: Secondary | ICD-10-CM

## 2018-09-24 DIAGNOSIS — I482 Chronic atrial fibrillation, unspecified: Secondary | ICD-10-CM

## 2018-09-24 DIAGNOSIS — J209 Acute bronchitis, unspecified: Secondary | ICD-10-CM

## 2018-09-24 DIAGNOSIS — I2510 Atherosclerotic heart disease of native coronary artery without angina pectoris: Secondary | ICD-10-CM

## 2018-09-24 DIAGNOSIS — E039 Hypothyroidism, unspecified: Secondary | ICD-10-CM | POA: Diagnosis not present
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
## 2018-09-30 DIAGNOSIS — R198 Other specified symptoms and signs involving the digestive system and abdomen: Secondary | ICD-10-CM | POA: Diagnosis not present

## 2018-09-30 DIAGNOSIS — K52832 Lymphocytic colitis: Secondary | ICD-10-CM | POA: Diagnosis not present

## 2018-09-30 DIAGNOSIS — K5901 Slow transit constipation: Secondary | ICD-10-CM | POA: Diagnosis not present

## 2018-09-30 DIAGNOSIS — I999 Unspecified disorder of circulatory system: Secondary | ICD-10-CM | POA: Diagnosis not present

## 2018-10-06 ENCOUNTER — Other Ambulatory Visit: Payer: Self-pay | Admitting: Internal Medicine
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
## 2018-10-07 ENCOUNTER — Ambulatory Visit (INDEPENDENT_AMBULATORY_CARE_PROVIDER_SITE_OTHER): Payer: Medicare Other | Admitting: Nurse Practitioner

## 2018-10-07 ENCOUNTER — Encounter: Payer: Self-pay | Admitting: Nurse Practitioner

## 2018-10-07 VITALS — BP 112/60 | HR 62 | Ht 66.0 in | Wt 146.4 lb

## 2018-10-07 DIAGNOSIS — I2510 Atherosclerotic heart disease of native coronary artery without angina pectoris: Secondary | ICD-10-CM

## 2018-10-07 DIAGNOSIS — I10 Essential (primary) hypertension: Secondary | ICD-10-CM

## 2018-10-07 DIAGNOSIS — I5032 Chronic diastolic (congestive) heart failure: Secondary | ICD-10-CM

## 2018-10-07 DIAGNOSIS — I482 Chronic atrial fibrillation, unspecified: Secondary | ICD-10-CM | POA: Diagnosis not present

## 2018-10-07 NOTE — Patient Instructions (Signed)
We will be checking the following labs today - NONE   Medication Instructions:    Continue with your current medicines.    If you need a refill on your cardiac medications before your next appointment, please call your pharmacy.     Testing/Procedures To Be Arranged:  N/A  Follow-Up:   See me in 4 months    At [facility], you and your health needs are our priority.  As part of our continuing mission to provide you with exceptional heart care, we have created designated Provider Care Teams.  These Care Teams include your primary Cardiologist (physician) and Advanced Practice Providers (APPs -  Physician Assistants and Nurse Practitioners) who all work together to provide you with the care you need, when you need it.  Special Instructions:  . Keep up the good work!  Call the [place] office at [phone] if you have any questions, problems or concerns.      2

## 2018-10-16 ENCOUNTER — Other Ambulatory Visit: Payer: Self-pay | Admitting: Internal Medicine

## 2018-10-16 NOTE — Telephone Encounter (Signed)
rx called into pharmacy

## 2018-11-04 ENCOUNTER — Telehealth: Payer: Self-pay | Admitting: *Deleted

## 2018-11-04 DIAGNOSIS — H353114 Nonexudative age-related macular degeneration, right eye, advanced atrophic with subfoveal involvement: Secondary | ICD-10-CM | POA: Diagnosis not present

## 2018-11-04 DIAGNOSIS — H35372 Puckering of macula, left eye: Secondary | ICD-10-CM | POA: Diagnosis not present

## 2018-11-04 DIAGNOSIS — H18423 Band keratopathy, bilateral: Secondary | ICD-10-CM | POA: Diagnosis not present

## 2018-11-04 DIAGNOSIS — H353221 Exudative age-related macular degeneration, left eye, with active choroidal neovascularization: Secondary | ICD-10-CM | POA: Diagnosis not present

## 2018-11-04 DIAGNOSIS — H43813 Vitreous degeneration, bilateral: Secondary | ICD-10-CM | POA: Diagnosis not present

## 2018-11-04 DIAGNOSIS — Z961 Presence of intraocular lens: Secondary | ICD-10-CM | POA: Diagnosis not present

## 2018-11-04 DIAGNOSIS — H35342 Macular cyst, hole, or pseudohole, left eye: Secondary | ICD-10-CM | POA: Diagnosis not present

## 2018-11-04 NOTE — Telephone Encounter (Signed)
Patient called and stated that she has Laryngitis and runny nose. Stated that when she blows sometimes its a little bloody. No fever. Patient stated that she is going out of town next week and wants to go ahead and get this taken care of. Wants an appointment there at [facility]. Nothing is available. Patient stated she would like to be worked in. Offered an appointment at the office and she wants to be seen there instead. Please Advise.

## 2018-11-04 NOTE — Telephone Encounter (Signed)
Schedule is totally full with an extra patient.  Let's add her at the 1pm that is blocked--this will be an exception.

## 2018-11-04 NOTE — Telephone Encounter (Signed)
Patient notified. Appointment scheduled for 11/05/18 at 1:00 at [facility]

## 2018-11-05 ENCOUNTER — Encounter: Payer: Self-pay | Admitting: Internal Medicine

## 2018-11-05 ENCOUNTER — Non-Acute Institutional Stay: Payer: Medicare Other | Admitting: Internal Medicine

## 2018-11-05 VITALS — BP 128/70 | HR 65 | Temp 98.7°F | Ht 66.0 in | Wt 145.0 lb

## 2018-11-05 DIAGNOSIS — J0100 Acute maxillary sinusitis, unspecified: Secondary | ICD-10-CM

## 2018-11-05 DIAGNOSIS — J439 Emphysema, unspecified: Secondary | ICD-10-CM | POA: Diagnosis not present

## 2018-11-05 MED ORDER — AMOXICILLIN-POT CLAVULANATE 875-125 MG PO TABS: 1.0000 | ORAL_TABLET | Freq: Two times a day (BID) | ORAL | 0 refills | Status: DC
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
## 2018-11-06 ENCOUNTER — Other Ambulatory Visit: Payer: Self-pay | Admitting: Internal Medicine

## 2018-11-14 ENCOUNTER — Other Ambulatory Visit: Payer: Self-pay | Admitting: Internal Medicine

## 2018-11-14 NOTE — Telephone Encounter (Signed)
rx called into pharmacy

## 2018-12-15 ENCOUNTER — Other Ambulatory Visit: Payer: Self-pay | Admitting: Internal Medicine

## 2018-12-16 ENCOUNTER — Other Ambulatory Visit: Payer: Self-pay | Admitting: Cardiology

## 2018-12-17 ENCOUNTER — Other Ambulatory Visit: Payer: Self-pay | Admitting: Internal Medicine

## 2018-12-30 DIAGNOSIS — H6123 Impacted cerumen, bilateral: Secondary | ICD-10-CM | POA: Diagnosis not present

## 2019-01-14 ENCOUNTER — Other Ambulatory Visit: Payer: Self-pay | Admitting: Internal Medicine

## 2019-02-02 ENCOUNTER — Ambulatory Visit: Payer: Self-pay | Admitting: Nurse Practitioner

## 2019-02-03 ENCOUNTER — Ambulatory Visit: Payer: Self-pay | Admitting: Nurse Practitioner

## 2019-02-04 ENCOUNTER — Encounter: Payer: Medicare Other | Admitting: Internal Medicine

## 2019-02-07 ENCOUNTER — Other Ambulatory Visit: Payer: Self-pay | Admitting: Nurse Practitioner

## 2019-02-07 ENCOUNTER — Other Ambulatory Visit: Payer: Self-pay | Admitting: Internal Medicine

## 2019-02-07 DIAGNOSIS — K219 Gastro-esophageal reflux disease without esophagitis: Secondary | ICD-10-CM

## 2019-02-12 ENCOUNTER — Other Ambulatory Visit: Payer: Self-pay | Admitting: Internal Medicine

## 2019-02-13 ENCOUNTER — Other Ambulatory Visit: Payer: Self-pay | Admitting: Internal Medicine

## 2019-02-18 ENCOUNTER — Encounter: Payer: Self-pay | Admitting: Internal Medicine

## 2019-02-18 ENCOUNTER — Non-Acute Institutional Stay: Payer: Medicare Other | Admitting: Internal Medicine

## 2019-02-18 ENCOUNTER — Other Ambulatory Visit: Payer: Self-pay

## 2019-02-18 VITALS — BP 120/60 | HR 77 | Temp 98.7°F | Ht 66.0 in | Wt 143.0 lb

## 2019-02-18 DIAGNOSIS — D6869 Other thrombophilia: Secondary | ICD-10-CM

## 2019-02-18 DIAGNOSIS — H353221 Exudative age-related macular degeneration, left eye, with active choroidal neovascularization: Secondary | ICD-10-CM | POA: Diagnosis not present

## 2019-02-18 DIAGNOSIS — F419 Anxiety disorder, unspecified: Secondary | ICD-10-CM | POA: Diagnosis not present

## 2019-02-18 DIAGNOSIS — I4891 Unspecified atrial fibrillation: Secondary | ICD-10-CM | POA: Diagnosis not present

## 2019-02-18 DIAGNOSIS — I700 Atherosclerosis of aorta: Secondary | ICD-10-CM | POA: Diagnosis not present

## 2019-02-18 DIAGNOSIS — F325 Major depressive disorder, single episode, in full remission: Secondary | ICD-10-CM | POA: Diagnosis not present

## 2019-02-18 DIAGNOSIS — I4821 Permanent atrial fibrillation: Secondary | ICD-10-CM | POA: Diagnosis not present

## 2019-02-18 DIAGNOSIS — I5032 Chronic diastolic (congestive) heart failure: Secondary | ICD-10-CM

## 2019-02-18 MED ORDER — ALPRAZOLAM 0.25 MG PO TABS: 0.2500 mg | ORAL_TABLET | Freq: Every evening | ORAL | 0 refills | Status: DC | PRN
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
## 2019-02-22 ENCOUNTER — Encounter: Payer: Self-pay | Admitting: Internal Medicine

## 2019-02-27 ENCOUNTER — Telehealth: Payer: Self-pay | Admitting: *Deleted

## 2019-02-27 NOTE — Telephone Encounter (Signed)
lvm with instructions for upcoming telehealth visit with Truitt Merle, NP on Monday, May 4.  Pt will need consent day of visit.  Pt is instructed if pt cannot keep appt to call office to R/S.
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
## 2019-03-02 ENCOUNTER — Encounter: Payer: Self-pay | Admitting: Nurse Practitioner

## 2019-03-02 ENCOUNTER — Telehealth (INDEPENDENT_AMBULATORY_CARE_PROVIDER_SITE_OTHER): Payer: Medicare Other | Admitting: Nurse Practitioner

## 2019-03-02 ENCOUNTER — Other Ambulatory Visit: Payer: Self-pay

## 2019-03-02 VITALS — BP 120/60 | HR 78 | Wt 143.0 lb

## 2019-03-02 DIAGNOSIS — I5032 Chronic diastolic (congestive) heart failure: Secondary | ICD-10-CM | POA: Diagnosis not present

## 2019-03-02 DIAGNOSIS — I2510 Atherosclerotic heart disease of native coronary artery without angina pectoris: Secondary | ICD-10-CM

## 2019-03-02 DIAGNOSIS — Z7189 Other specified counseling: Secondary | ICD-10-CM

## 2019-03-02 DIAGNOSIS — I10 Essential (primary) hypertension: Secondary | ICD-10-CM

## 2019-03-02 DIAGNOSIS — I482 Chronic atrial fibrillation, unspecified: Secondary | ICD-10-CM

## 2019-03-02 NOTE — Patient Instructions (Addendum)
After Visit Summary:  We will be checking the following labs today - NONE  If you have labs (blood work) drawn today and your tests are completely normal, you will receive your results only by: Marland Kitchen MyChart Message (if you have MyChart) OR . A paper copy in the mail If you have any lab test that is abnormal or we need to change your treatment, we will call you to review the results.   Medication Instructions:    Continue with your current medicines.   Let's stay on the Protonix.    If you need a refill on your cardiac medications before your next appointment, please call your pharmacy.     Testing/Procedures To Be Arranged:  N/A  Follow-Up:   See me in 4 months     At [facility], you and your health needs are our priority.  As part of our continuing mission to provide you with exceptional heart care, we have created designated Provider Care Teams.  These Care Teams include your primary Cardiologist (physician) and Advanced Practice Providers (APPs -  Physician Assistants and Nurse Practitioners) who all work together to provide you with the care you need, when you need it.  Special Instructions:  . Stay safe, stay home, wash your hands for at least 20 seconds and wear a mask when out in public.  . It was good to talk with you today.    Call the [place] office at [phone] if you have any questions, problems or concerns.

## 2019-03-02 NOTE — Telephone Encounter (Signed)

## 2019-03-03 ENCOUNTER — Telehealth: Payer: Self-pay | Admitting: Nurse Practitioner

## 2019-03-07 ENCOUNTER — Other Ambulatory Visit: Payer: Self-pay | Admitting: Nurse Practitioner

## 2019-03-09 NOTE — Telephone Encounter (Signed)
S/w pt to verify dosage of Lasix.  Pt is taking one tablet (40 mg) daily, pt can take an additional half tablet (20 mg ) as needed for swelling. Sent in today, medication list updated.

## 2019-03-13 ENCOUNTER — Other Ambulatory Visit: Payer: Self-pay | Admitting: Internal Medicine

## 2019-04-06 ENCOUNTER — Other Ambulatory Visit: Payer: Self-pay | Admitting: Internal Medicine

## 2019-05-05 DIAGNOSIS — K52832 Lymphocytic colitis: Secondary | ICD-10-CM | POA: Diagnosis not present

## 2019-05-05 DIAGNOSIS — K5901 Slow transit constipation: Secondary | ICD-10-CM | POA: Diagnosis not present

## 2019-05-05 DIAGNOSIS — K219 Gastro-esophageal reflux disease without esophagitis: Secondary | ICD-10-CM | POA: Diagnosis not present

## 2019-05-05 DIAGNOSIS — R198 Other specified symptoms and signs involving the digestive system and abdomen: Secondary | ICD-10-CM | POA: Diagnosis not present

## 2019-05-05 DIAGNOSIS — I999 Unspecified disorder of circulatory system: Secondary | ICD-10-CM | POA: Diagnosis not present

## 2019-05-12 DIAGNOSIS — H35372 Puckering of macula, left eye: Secondary | ICD-10-CM | POA: Diagnosis not present

## 2019-05-12 DIAGNOSIS — H18423 Band keratopathy, bilateral: Secondary | ICD-10-CM | POA: Diagnosis not present

## 2019-05-12 DIAGNOSIS — H353221 Exudative age-related macular degeneration, left eye, with active choroidal neovascularization: Secondary | ICD-10-CM | POA: Diagnosis not present

## 2019-05-12 DIAGNOSIS — Z961 Presence of intraocular lens: Secondary | ICD-10-CM | POA: Diagnosis not present

## 2019-05-12 DIAGNOSIS — H353114 Nonexudative age-related macular degeneration, right eye, advanced atrophic with subfoveal involvement: Secondary | ICD-10-CM | POA: Diagnosis not present

## 2019-05-12 DIAGNOSIS — H43813 Vitreous degeneration, bilateral: Secondary | ICD-10-CM | POA: Diagnosis not present

## 2019-05-15 ENCOUNTER — Other Ambulatory Visit: Payer: Self-pay | Admitting: Internal Medicine

## 2019-05-15 DIAGNOSIS — F419 Anxiety disorder, unspecified: Secondary | ICD-10-CM

## 2019-05-15 NOTE — Telephone Encounter (Signed)
[facility] [place] patient requesting refill for alprazolam 0.25 mg tab. Verified patient's pharmacy [facility] and verified last refill [place] database 02/18/2019 for 90 tabs. Last appointment with Dr. Mariea Clonts 02/18/2019 and next appointment is 06/24/2019

## 2019-06-08 ENCOUNTER — Telehealth: Payer: Self-pay

## 2019-06-08 NOTE — Telephone Encounter (Signed)
.  left message to have patient return my call. Pt has lab appt at [place] on 06/16/2019 at 8am

## 2019-06-08 NOTE — Telephone Encounter (Signed)
Patient called and stated she would like to confirm her appointment with Dr. Mariea Clonts at [facility] on 8/26 at 2:30 pm. She also stated she did not have recent lab work done at her last appointment and would like to schedule to have labs done before her appointment. Please advise.

## 2019-06-08 NOTE — Telephone Encounter (Signed)
.  left message to have patient return my call.  

## 2019-06-08 NOTE — Telephone Encounter (Signed)
Debra Brooks, It looks like she already should have labs scheduled before her appt based on my note.  Can you please check with Bernadette/Sandra about it?

## 2019-06-16 DIAGNOSIS — D649 Anemia, unspecified: Secondary | ICD-10-CM | POA: Diagnosis not present

## 2019-06-16 DIAGNOSIS — I10 Essential (primary) hypertension: Secondary | ICD-10-CM | POA: Diagnosis not present

## 2019-06-16 DIAGNOSIS — E785 Hyperlipidemia, unspecified: Secondary | ICD-10-CM | POA: Diagnosis not present

## 2019-06-16 LAB — CBC AND DIFFERENTIAL
HCT: 40 (ref 36–46)
Hemoglobin: 13.6 (ref 12.0–16.0)
Platelets: 190 (ref 150–399)
WBC: 4.7

## 2019-06-16 LAB — BASIC METABOLIC PANEL
BUN: 25 — AB (ref 4–21)
Creatinine: 1.1 (ref 0.5–1.1)
Glucose: 85
Potassium: 4.5 (ref 3.4–5.3)
Sodium: 141 (ref 137–147)

## 2019-06-16 LAB — HEPATIC FUNCTION PANEL
ALT: 16 (ref 7–35)
AST: 20 (ref 13–35)
Alkaline Phosphatase: 64 (ref 25–125)
Bilirubin, Total: 0.6

## 2019-06-16 LAB — LIPID PANEL
Cholesterol: 203 — AB (ref 0–200)
HDL: 70 (ref 35–70)
LDL Cholesterol: 115
Triglycerides: 91 (ref 40–160)

## 2019-06-22 ENCOUNTER — Encounter: Payer: Self-pay | Admitting: Internal Medicine

## 2019-06-24 ENCOUNTER — Non-Acute Institutional Stay: Payer: Medicare Other | Admitting: Internal Medicine

## 2019-06-24 ENCOUNTER — Encounter: Payer: Self-pay | Admitting: Internal Medicine

## 2019-06-24 ENCOUNTER — Other Ambulatory Visit: Payer: Self-pay

## 2019-06-24 VITALS — BP 120/60 | HR 79 | Temp 97.8°F | Ht 65.0 in | Wt 139.0 lb

## 2019-06-24 DIAGNOSIS — I4891 Unspecified atrial fibrillation: Secondary | ICD-10-CM | POA: Diagnosis not present

## 2019-06-24 DIAGNOSIS — I2510 Atherosclerotic heart disease of native coronary artery without angina pectoris: Secondary | ICD-10-CM | POA: Diagnosis not present

## 2019-06-24 DIAGNOSIS — D6869 Other thrombophilia: Secondary | ICD-10-CM | POA: Diagnosis not present

## 2019-06-24 DIAGNOSIS — R1907 Generalized intra-abdominal and pelvic swelling, mass and lump: Secondary | ICD-10-CM

## 2019-06-24 DIAGNOSIS — I4821 Permanent atrial fibrillation: Secondary | ICD-10-CM | POA: Diagnosis not present

## 2019-06-24 DIAGNOSIS — R198 Other specified symptoms and signs involving the digestive system and abdomen: Secondary | ICD-10-CM

## 2019-06-24 DIAGNOSIS — H353221 Exudative age-related macular degeneration, left eye, with active choroidal neovascularization: Secondary | ICD-10-CM

## 2019-06-24 DIAGNOSIS — I872 Venous insufficiency (chronic) (peripheral): Secondary | ICD-10-CM | POA: Diagnosis not present

## 2019-06-24 DIAGNOSIS — N183 Chronic kidney disease, stage 3 unspecified: Secondary | ICD-10-CM

## 2019-06-24 DIAGNOSIS — D509 Iron deficiency anemia, unspecified: Secondary | ICD-10-CM
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
## 2019-06-26 DIAGNOSIS — I872 Venous insufficiency (chronic) (peripheral): Secondary | ICD-10-CM | POA: Insufficient documentation

## 2019-06-30 ENCOUNTER — Telehealth: Payer: Self-pay | Admitting: *Deleted

## 2019-06-30 NOTE — Telephone Encounter (Signed)
.  left message to have patient return my call.   Per Dr. Mariea Clonts "Can you please call Horatio Pel and let her know that Dr. Cristina Gong and I spoke and we decided to get a noncontrast CT of her abdomen to look into the area on her right side that concerns her? thanks

## 2019-06-30 NOTE — Addendum Note (Signed)
Addended by: Gayland Curry on: 06/30/2019 10:34 AM   Modules accepted: Orders

## 2019-07-02 NOTE — Telephone Encounter (Signed)
Spoke with patient and advised results   
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
## 2019-07-21 ENCOUNTER — Other Ambulatory Visit: Payer: Self-pay

## 2019-07-21 ENCOUNTER — Ambulatory Visit (INDEPENDENT_AMBULATORY_CARE_PROVIDER_SITE_OTHER): Payer: Medicare Other | Admitting: Nurse Practitioner

## 2019-07-21 ENCOUNTER — Encounter: Payer: Self-pay | Admitting: Nurse Practitioner

## 2019-07-21 VITALS — BP 138/78 | HR 108 | Ht 65.0 in | Wt 137.8 lb

## 2019-07-21 DIAGNOSIS — Z7901 Long term (current) use of anticoagulants: Secondary | ICD-10-CM | POA: Diagnosis not present

## 2019-07-21 DIAGNOSIS — I10 Essential (primary) hypertension: Secondary | ICD-10-CM

## 2019-07-21 DIAGNOSIS — I2510 Atherosclerotic heart disease of native coronary artery without angina pectoris: Secondary | ICD-10-CM

## 2019-07-21 DIAGNOSIS — I5032 Chronic diastolic (congestive) heart failure: Secondary | ICD-10-CM | POA: Diagnosis not present

## 2019-07-21 DIAGNOSIS — I482 Chronic atrial fibrillation, unspecified: Secondary | ICD-10-CM

## 2019-07-21 DIAGNOSIS — Z7189 Other specified counseling: Secondary | ICD-10-CM | POA: Diagnosis not present

## 2019-07-21 NOTE — Patient Instructions (Signed)
After Visit Summary:  We will be checking the following labs today - NONE   Medication Instructions:    Continue with your current medicines.    If you need a refill on your cardiac medications before your next appointment, please call your pharmacy.     Testing/Procedures To Be Arranged:  N/A  Follow-Up:   See me in 4 months    At [facility], you and your health needs are our priority.  As part of our continuing mission to provide you with exceptional heart care, we have created designated Provider Care Teams.  These Care Teams include your primary Cardiologist (physician) and Advanced Practice Providers (APPs -  Physician Assistants and Nurse Practitioners) who all work together to provide you with the care you need, when you need it.  Special Instructions:  . Stay safe, stay home, wash your hands for at least 20 seconds and wear a mask when out in public.  . It was good to talk with you today.  . Keep up the good work!   Call the [facility] office at [phone] if you have any questions, problems or concerns.       

## 2019-07-22 ENCOUNTER — Telehealth: Payer: Self-pay | Admitting: *Deleted

## 2019-07-22 NOTE — Telephone Encounter (Signed)
If the oral contrast contains iodine, she cannot have it due to her allergy.  If it does not contain iodine, she can have it.  Ideally, the study should be done with the oral contrast to be most useful.

## 2019-07-22 NOTE — Telephone Encounter (Signed)
Thenia notified. LM on her voicemail of Dr. Cyndi Lennert response.

## 2019-07-22 NOTE — Telephone Encounter (Signed)
Thenia with [facility] called and stated that patient is down for a CT Scan. Stated that chart states no Iodine. Patient is wanting to know if she needs to do the 2 bottles of oral contrast.   I told Caswell Corwin that the exam states without contrast and she stated that that's without IV but they still give the oral contrast to highlight the organs.   Please Advise.

## 2019-07-30 ENCOUNTER — Other Ambulatory Visit: Payer: Self-pay

## 2019-07-30 ENCOUNTER — Ambulatory Visit
Admission: RE | Admit: 2019-07-30 | Discharge: 2019-07-30 | Disposition: A | Discharge status: Home / Self Care | Payer: Medicare Other | Source: Ambulatory Visit | Attending: Internal Medicine | Admitting: Internal Medicine

## 2019-07-30 DIAGNOSIS — R1907 Generalized intra-abdominal and pelvic swelling, mass and lump: Secondary | ICD-10-CM

## 2019-07-30 DIAGNOSIS — K8020 Calculus of gallbladder without cholecystitis without obstruction: Secondary | ICD-10-CM | POA: Diagnosis not present

## 2019-07-30 DIAGNOSIS — K5730 Diverticulosis of large intestine without perforation or abscess without bleeding: Secondary | ICD-10-CM | POA: Diagnosis not present

## 2019-07-30 DIAGNOSIS — R198 Other specified symptoms and signs involving the digestive system and abdomen: Secondary | ICD-10-CM

## 2019-07-31 ENCOUNTER — Telehealth: Payer: Self-pay | Admitting: *Deleted

## 2019-07-31 NOTE — Telephone Encounter (Signed)
I already responded to it this morning and the ovarian mass is not a new finding and patient does not want intervention.

## 2019-07-31 NOTE — Telephone Encounter (Signed)
Diane with [facility] called and wanted to be sure that Dr. Mariea Clonts received patient's results from her CT Abdomen she done yesterday. Report is in chart. Message sent to Dr. Mariea Clonts.

## 2019-08-04 ENCOUNTER — Other Ambulatory Visit: Payer: Self-pay | Admitting: Internal Medicine

## 2019-08-10 ENCOUNTER — Ambulatory Visit (INDEPENDENT_AMBULATORY_CARE_PROVIDER_SITE_OTHER): Payer: Medicare Other | Admitting: Internal Medicine

## 2019-08-10 ENCOUNTER — Other Ambulatory Visit: Payer: Self-pay

## 2019-08-10 DIAGNOSIS — N83201 Unspecified ovarian cyst, right side: Secondary | ICD-10-CM

## 2019-08-10 DIAGNOSIS — Z7189 Other specified counseling: Secondary | ICD-10-CM | POA: Diagnosis not present

## 2019-08-10 DIAGNOSIS — R198 Other specified symptoms and signs involving the digestive system and abdomen: Secondary | ICD-10-CM | POA: Diagnosis not present
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
## 2019-08-11 ENCOUNTER — Other Ambulatory Visit: Payer: Self-pay

## 2019-08-11 ENCOUNTER — Ambulatory Visit (INDEPENDENT_AMBULATORY_CARE_PROVIDER_SITE_OTHER): Payer: Medicare Other

## 2019-08-11 DIAGNOSIS — Z23 Encounter for immunization: Secondary | ICD-10-CM | POA: Diagnosis not present

## 2019-08-11 DIAGNOSIS — Z8669 Personal history of other diseases of the nervous system and sense organs: Secondary | ICD-10-CM | POA: Diagnosis not present

## 2019-08-11 DIAGNOSIS — H9111 Presbycusis, right ear: Secondary | ICD-10-CM | POA: Diagnosis not present

## 2019-08-13 ENCOUNTER — Encounter: Payer: Self-pay | Admitting: Internal Medicine

## 2019-08-13 DIAGNOSIS — Z79899 Other long term (current) drug therapy: Secondary | ICD-10-CM | POA: Diagnosis not present

## 2019-08-13 DIAGNOSIS — K52832 Lymphocytic colitis: Secondary | ICD-10-CM | POA: Diagnosis not present

## 2019-08-13 DIAGNOSIS — R5383 Other fatigue: Secondary | ICD-10-CM | POA: Diagnosis not present

## 2019-08-13 DIAGNOSIS — R971 Elevated cancer antigen 125 [CA 125]: Secondary | ICD-10-CM | POA: Diagnosis not present

## 2019-08-15 LAB — CA 125: CA 125: 16

## 2019-08-19 ENCOUNTER — Telehealth: Payer: Self-pay

## 2019-08-19 NOTE — Telephone Encounter (Signed)
CA 125 Patient Results 16, reference Range <35  Dr.Reed's comments:  Patient doe not want info-request son to be notified.  Fortunately this is low. Await Ultrasound results.  I called patients son Shanon Brow and discussed results. Shanon Brow verbalized understanding of results and questions when will U/S be set up. I seen order was placed on 08/10/2019 and is still marked as needs to be scheduled. David aware I will look into this and call back with more information.   Lattie Haw can you please follow-up on U/S order placed on 08/10/2019

## 2019-08-19 NOTE — Telephone Encounter (Signed)
Just spoke with GI & they will call pt today 08/19/19 to get sch/LM

## 2019-08-21 DIAGNOSIS — R935 Abnormal findings on diagnostic imaging of other abdominal regions, including retroperitoneum: Secondary | ICD-10-CM | POA: Diagnosis not present

## 2019-08-21 DIAGNOSIS — K52832 Lymphocytic colitis: Secondary | ICD-10-CM | POA: Diagnosis not present

## 2019-08-21 DIAGNOSIS — R198 Other specified symptoms and signs involving the digestive system and abdomen: Secondary | ICD-10-CM | POA: Diagnosis not present

## 2019-08-22 ENCOUNTER — Other Ambulatory Visit: Payer: Self-pay | Admitting: Internal Medicine

## 2019-08-22 DIAGNOSIS — F419 Anxiety disorder, unspecified: Secondary | ICD-10-CM

## 2019-08-24 NOTE — Telephone Encounter (Signed)
For your review/approval.  Allergy to diazepam.

## 2019-08-27 ENCOUNTER — Other Ambulatory Visit: Payer: Medicare Other

## 2019-08-31 ENCOUNTER — Other Ambulatory Visit: Payer: Self-pay | Admitting: Nurse Practitioner

## 2019-08-31 ENCOUNTER — Other Ambulatory Visit: Payer: Self-pay | Admitting: Internal Medicine

## 2019-08-31 NOTE — Telephone Encounter (Signed)
Verified in [facility] database last refill was written on 03/13/2019 and filled on 08/09/2019. Verified correct pharmacy. Patient's last appointment was 08/10/2019 and next appointment is 10/21/2019.

## 2019-09-02 ENCOUNTER — Other Ambulatory Visit: Payer: Self-pay | Admitting: Internal Medicine

## 2019-09-03 ENCOUNTER — Ambulatory Visit
Admission: RE | Admit: 2019-09-03 | Discharge: 2019-09-03 | Disposition: A | Discharge status: Home / Self Care | Payer: Medicare Other | Source: Ambulatory Visit | Attending: Internal Medicine | Admitting: Internal Medicine

## 2019-09-03 DIAGNOSIS — N83201 Unspecified ovarian cyst, right side: Secondary | ICD-10-CM | POA: Diagnosis not present

## 2019-09-03 DIAGNOSIS — R198 Other specified symptoms and signs involving the digestive system and abdomen: Secondary | ICD-10-CM

## 2019-09-03 DIAGNOSIS — N838 Other noninflammatory disorders of ovary, fallopian tube and broad ligament: Secondary | ICD-10-CM | POA: Diagnosis not present

## 2019-09-03 NOTE — Telephone Encounter (Signed)
Controlled substance refill.

## 2019-09-08 ENCOUNTER — Telehealth: Payer: Self-pay | Admitting: *Deleted

## 2019-09-08 NOTE — Telephone Encounter (Signed)
Patient son, Debra Brooks called wanting the results from patient's Ultrasound results done last week on 09/03/2019. Stated that he was concerned. Please Advise.

## 2019-09-08 NOTE — Telephone Encounter (Signed)
Noted  

## 2019-09-08 NOTE — Telephone Encounter (Signed)
Covering provider had opted to leave that for me to address this week.  I will call him to review the results.

## 2019-09-09 NOTE — Telephone Encounter (Signed)
I called Debra Brooks this afternoon. I have reviewed the most recent CT from last month and the recent pelvic ultrasound.   Debra Brooks has had a cystic mass on the right ovary that has been worrisome since 2012 when seen on MRI. Recent study shows that it has increased in size.   She has no plans to see GYN or change her current plan of care which she is very satisfied with. She is a DNR.   I will continue to be available as needed.   Burtis Junes, RN, [facility] [address] [place], [place]  [address] [phone]

## 2019-09-30 ENCOUNTER — Other Ambulatory Visit: Payer: Self-pay | Admitting: Internal Medicine

## 2019-09-30 ENCOUNTER — Other Ambulatory Visit: Payer: Self-pay | Admitting: Nurse Practitioner

## 2019-10-01 ENCOUNTER — Other Ambulatory Visit: Payer: Self-pay | Admitting: Nurse Practitioner

## 2019-10-01 DIAGNOSIS — D1801 Hemangioma of skin and subcutaneous tissue: Secondary | ICD-10-CM | POA: Diagnosis not present

## 2019-10-01 DIAGNOSIS — D692 Other nonthrombocytopenic purpura: Secondary | ICD-10-CM | POA: Diagnosis not present

## 2019-10-01 DIAGNOSIS — L821 Other seborrheic keratosis: Secondary | ICD-10-CM | POA: Diagnosis not present

## 2019-10-01 DIAGNOSIS — L680 Hirsutism: Secondary | ICD-10-CM | POA: Diagnosis not present

## 2019-10-01 DIAGNOSIS — L820 Inflamed seborrheic keratosis: Secondary | ICD-10-CM | POA: Diagnosis not present

## 2019-10-02 NOTE — Telephone Encounter (Signed)
Pending appointment 10/21/2019, note made to update contract.  RX last filled 09/03/2019, #30

## 2019-10-21 ENCOUNTER — Other Ambulatory Visit: Payer: Self-pay

## 2019-10-21 ENCOUNTER — Encounter: Payer: Self-pay | Admitting: Internal Medicine

## 2019-10-21 ENCOUNTER — Non-Acute Institutional Stay: Payer: Medicare Other | Admitting: Internal Medicine

## 2019-10-21 VITALS — BP 138/78 | HR 74 | Temp 97.1°F | Ht 65.0 in | Wt 136.0 lb

## 2019-10-21 DIAGNOSIS — I2510 Atherosclerotic heart disease of native coronary artery without angina pectoris: Secondary | ICD-10-CM | POA: Diagnosis not present

## 2019-10-21 DIAGNOSIS — I4821 Permanent atrial fibrillation: Secondary | ICD-10-CM

## 2019-10-21 DIAGNOSIS — H353221 Exudative age-related macular degeneration, left eye, with active choroidal neovascularization: Secondary | ICD-10-CM | POA: Diagnosis not present

## 2019-10-21 DIAGNOSIS — N83201 Unspecified ovarian cyst, right side: Secondary | ICD-10-CM | POA: Diagnosis not present

## 2019-10-21 DIAGNOSIS — D509 Iron deficiency anemia, unspecified: Secondary | ICD-10-CM

## 2019-10-21 DIAGNOSIS — F419 Anxiety disorder, unspecified: Secondary | ICD-10-CM

## 2019-10-21 DIAGNOSIS — I872 Venous insufficiency (chronic) (peripheral): Secondary | ICD-10-CM
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
## 2019-10-27 ENCOUNTER — Other Ambulatory Visit: Payer: Self-pay

## 2019-10-27 ENCOUNTER — Other Ambulatory Visit: Payer: Self-pay | Admitting: Internal Medicine

## 2019-10-27 ENCOUNTER — Other Ambulatory Visit: Payer: Self-pay | Admitting: Nurse Practitioner

## 2019-10-27 NOTE — Telephone Encounter (Signed)
rx sent to pharmacy by e-script  

## 2019-10-27 NOTE — Telephone Encounter (Signed)
Eliquis 5mg  refill request received, pt is 85yrs old, weight-61.7kg, Crea-1.1 on 06/16/2019, Diagnosis-Afib, and last seen by Truitt Merle on 07/21/2019. Dose is appropriate based on dosing criteria. Will send in refill to requested pharmacy.

## 2019-10-29 ENCOUNTER — Other Ambulatory Visit: Payer: Self-pay | Admitting: Internal Medicine

## 2019-11-02 NOTE — Telephone Encounter (Signed)
Last filled 10/02/2019

## 2019-11-04 ENCOUNTER — Telehealth: Payer: Self-pay | Admitting: *Deleted

## 2019-11-04 NOTE — Telephone Encounter (Signed)
Pt calling in today to see if any special instructions for the COVID vaccine.  Pt is scheduled to get the vaccine on Monday, Jan 11.  Pt is advised no special instructions.

## 2019-11-11 DIAGNOSIS — Z23 Encounter for immunization: Secondary | ICD-10-CM | POA: Diagnosis not present

## 2019-11-17 DIAGNOSIS — H353221 Exudative age-related macular degeneration, left eye, with active choroidal neovascularization: Secondary | ICD-10-CM | POA: Diagnosis not present

## 2019-11-17 DIAGNOSIS — H18423 Band keratopathy, bilateral: Secondary | ICD-10-CM | POA: Diagnosis not present

## 2019-11-17 DIAGNOSIS — H43813 Vitreous degeneration, bilateral: Secondary | ICD-10-CM | POA: Diagnosis not present

## 2019-11-17 DIAGNOSIS — H353114 Nonexudative age-related macular degeneration, right eye, advanced atrophic with subfoveal involvement: Secondary | ICD-10-CM | POA: Diagnosis not present

## 2019-11-17 DIAGNOSIS — H35372 Puckering of macula, left eye: Secondary | ICD-10-CM | POA: Diagnosis not present

## 2019-11-17 DIAGNOSIS — Z961 Presence of intraocular lens: Secondary | ICD-10-CM | POA: Diagnosis not present
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
## 2019-11-24 ENCOUNTER — Ambulatory Visit (INDEPENDENT_AMBULATORY_CARE_PROVIDER_SITE_OTHER): Payer: Medicare Other | Admitting: Nurse Practitioner

## 2019-11-24 ENCOUNTER — Encounter: Payer: Self-pay | Admitting: Nurse Practitioner

## 2019-11-24 ENCOUNTER — Other Ambulatory Visit: Payer: Self-pay

## 2019-11-24 VITALS — BP 122/60 | HR 83 | Ht 65.0 in | Wt 136.8 lb

## 2019-11-24 DIAGNOSIS — Z7189 Other specified counseling: Secondary | ICD-10-CM

## 2019-11-24 DIAGNOSIS — I2510 Atherosclerotic heart disease of native coronary artery without angina pectoris: Secondary | ICD-10-CM | POA: Diagnosis not present

## 2019-11-24 DIAGNOSIS — Z7901 Long term (current) use of anticoagulants: Secondary | ICD-10-CM | POA: Diagnosis not present

## 2019-11-24 DIAGNOSIS — I5032 Chronic diastolic (congestive) heart failure: Secondary | ICD-10-CM

## 2019-11-24 MED ORDER — POTASSIUM CHLORIDE CRYS ER 20 MEQ PO TBCR: EXTENDED_RELEASE_TABLET | ORAL | 1 refills | Status: DC

## 2019-11-24 NOTE — Patient Instructions (Addendum)
After Visit Summary:  We will be checking the following labs today - BMET, CBC and HPF   Medication Instructions:    Continue with your current medicines.  I want you to take an extra 1/2 of Lasix at lunch for one week - then go back to just one a day.  Take an extra dose of your potassium for this next week too - I have sent this to the pharmacy   If you need a refill on your cardiac medications before your next appointment, please call your pharmacy.     Testing/Procedures To Be Arranged:  N/A  Follow-Up:   See me in 4 months    At [facility], you and your health needs are our priority.  As part of our continuing mission to provide you with exceptional heart care, we have created designated Provider Care Teams.  These Care Teams include your primary Cardiologist (physician) and Advanced Practice Providers (APPs -  Physician Assistants and Nurse Practitioners) who all work together to provide you with the care you need, when you need it.  Special Instructions:  . Stay safe, stay home, wash your hands for at least 20 seconds and wear a mask when out in public.  . It was good to talk with you today.  . You need to lie down and elevate your legs for at least 30 minutes every day.  . Continue to "stay at home" for 2 weeks after your second vaccine and even then - wear a mask, social distance and stay home.    Call the [place] office at [phone] if you have any questions, problems or concerns.

## 2019-11-25 ENCOUNTER — Other Ambulatory Visit: Payer: Self-pay | Admitting: Internal Medicine

## 2019-11-25 DIAGNOSIS — F419 Anxiety disorder, unspecified: Secondary | ICD-10-CM

## 2019-11-25 LAB — BASIC METABOLIC PANEL
BUN/Creatinine Ratio: 16 (ref 12–28)
BUN: 19 mg/dL (ref 10–36)
CO2: 28 mmol/L (ref 20–29)
Calcium: 10.7 mg/dL — ABNORMAL HIGH (ref 8.7–10.3)
Chloride: 103 mmol/L (ref 96–106)
Creatinine, Ser: 1.19 mg/dL — ABNORMAL HIGH (ref 0.57–1.00)
GFR calc Af Amer: 46 mL/min/{1.73_m2} — ABNORMAL LOW (ref >59)
GFR calc non Af Amer: 40 mL/min/{1.73_m2} — ABNORMAL LOW (ref >59)
Glucose: 108 mg/dL — ABNORMAL HIGH (ref 65–99)
Potassium: 4.4 mmol/L (ref 3.5–5.2)
Sodium: 144 mmol/L (ref 134–144)

## 2019-11-25 LAB — CBC
Hematocrit: 36.7 % (ref 34.0–46.6)
Hemoglobin: 11.7 g/dL (ref 11.1–15.9)
MCH: 31 pg (ref 26.6–33.0)
MCHC: 31.9 g/dL (ref 31.5–35.7)
MCV: 97 fL (ref 79–97)
Platelets: 235 10*3/uL (ref 150–450)
RBC: 3.77 x10E6/uL (ref 3.77–5.28)
RDW: 12.6 % (ref 11.7–15.4)
WBC: 6 10*3/uL (ref 3.4–10.8)

## 2019-11-25 LAB — HEPATIC FUNCTION PANEL
ALT: 16 IU/L (ref 0–32)
AST: 22 IU/L (ref 0–40)
Albumin: 3.9 g/dL (ref 3.5–4.6)
Alkaline Phosphatase: 76 IU/L (ref 39–117)
Bilirubin Total: 0.5 mg/dL (ref 0.0–1.2)
Bilirubin, Direct: 0.16 mg/dL (ref 0.00–0.40)
Total Protein: 5.8 g/dL — ABNORMAL LOW (ref 6.0–8.5)

## 2019-11-30 ENCOUNTER — Other Ambulatory Visit: Payer: Self-pay | Admitting: Internal Medicine

## 2019-11-30 NOTE — Telephone Encounter (Signed)
Medication was sent for refill. Patient last appointment with provider Dr.Reed was 10/21/2019. Patient just had appointment with Cardiology on 11/24/2019. Medication pend and sent to provider. Please Advise.

## 2019-11-30 NOTE — Telephone Encounter (Signed)
Will refill on 12/03/2019 last script

## 2019-12-08 DIAGNOSIS — Z23 Encounter for immunization: Secondary | ICD-10-CM | POA: Diagnosis not present

## 2020-01-05 DIAGNOSIS — H938X1 Other specified disorders of right ear: Secondary | ICD-10-CM | POA: Diagnosis not present

## 2020-01-05 DIAGNOSIS — Z974 Presence of external hearing-aid: Secondary | ICD-10-CM | POA: Diagnosis not present

## 2020-01-05 DIAGNOSIS — H9113 Presbycusis, bilateral: Secondary | ICD-10-CM | POA: Diagnosis not present

## 2020-01-08 ENCOUNTER — Ambulatory Visit (INDEPENDENT_AMBULATORY_CARE_PROVIDER_SITE_OTHER): Payer: Medicare Other | Admitting: Family

## 2020-01-08 ENCOUNTER — Other Ambulatory Visit: Payer: Self-pay

## 2020-01-08 ENCOUNTER — Encounter: Payer: Self-pay | Admitting: Family

## 2020-01-08 DIAGNOSIS — Z0000 Encounter for general adult medical examination without abnormal findings: Secondary | ICD-10-CM | POA: Diagnosis not present

## 2020-01-08 NOTE — Progress Notes (Signed)
Patient ID: Debra Brooks, female   DOB: 1924/12/28, 86 y.o.   MRN: [id number] This service is provided via telemedicine  No vital signs collected/recorded due to the encounter was a telemedicine visit.   Location of patient (ex: home, work):  HOME  Patient consents to a telephone visit:  YES  Location of the provider (ex: office, home):  OFFICE  Name of any referring provider:  TIFFANY REED, DO  Names of all persons participating in the telemedicine service and their role in the encounter:  PATIENT, Edwin Dada, [place], [facility] NGETICH,NP  Time spent on call:  8:38

## 2020-01-08 NOTE — Patient Instructions (Signed)
[name] , Thank you for taking time to come for your Medicare Wellness Visit. I appreciate your ongoing commitment to your health goals. Please review the following plan we discussed and let me know if I can assist you in the future.   Screening recommendations/referrals: Colonoscopy: Age out  Mammogram: Up to date  Bone Density : Up to date  Recommended yearly ophthalmology/optometry visit for glaucoma screening and checkup Recommended yearly dental visit for hygiene and checkup  Vaccinations: Influenza vaccine : Up to date  Pneumococcal vaccine : Up to date  Tdap vaccine : Up to date due next 02/26/2028  Shingles vaccine : Up to date    Advanced directives: Yes   Conditions/risks identified: Advanced age female > 60 yrs,Hypertension  And Hx of smoking   Next appointment: 1 year    Preventive Care 48 Years and Older, Female Preventive care refers to lifestyle choices and visits with your health care provider that can promote health and wellness. What does preventive care include?  A yearly physical exam. This is also called an annual well check.  Dental exams once or twice a year.  Routine eye exams. Ask your health care provider how often you should have your eyes checked.  Personal lifestyle choices, including:  Daily care of your teeth and gums.  Regular physical activity.  Eating a healthy diet.  Avoiding tobacco and drug use.  Limiting alcohol use.  Practicing safe sex.  Taking low-dose aspirin every day.  Taking vitamin and mineral supplements as recommended by your health care provider. What happens during an annual well check? The services and screenings done by your health care provider during your annual well check will depend on your age, overall health, lifestyle risk factors, and family history of disease. Counseling  Your health care provider may ask you questions about your:  Alcohol use.  Tobacco use.  Drug use.  Emotional well-being.   Home and relationship well-being.  Sexual activity.  Eating habits.  History of falls.  Memory and ability to understand (cognition).  Work and work Statistician.  Reproductive health. Screening  You may have the following tests or measurements:  Height, weight, and BMI.  Blood pressure.  Lipid and cholesterol levels. These may be checked every 5 years, or more frequently if you are over 19 years old.  Skin check.  Lung cancer screening. You may have this screening every year starting at age 19 if you have a 30-pack-year history of smoking and currently smoke or have quit within the past 15 years.  Fecal occult blood test (FOBT) of the stool. You may have this test every year starting at age 32.  Flexible sigmoidoscopy or colonoscopy. You may have a sigmoidoscopy every 5 years or a colonoscopy every 10 years starting at age 43.  Hepatitis C blood test.  Hepatitis B blood test.  Sexually transmitted disease (STD) testing.  Diabetes screening. This is done by checking your blood sugar (glucose) after you have not eaten for a while (fasting). You may have this done every 1-3 years.  Bone density scan. This is done to screen for osteoporosis. You may have this done starting at age 73.  Mammogram. This may be done every 1-2 years. Talk to your health care provider about how often you should have regular mammograms. Talk with your health care provider about your test results, treatment options, and if necessary, the need for more tests. Vaccines  Your health care provider may recommend certain vaccines, such as:  Influenza  vaccine. This is recommended every year.  Tetanus, diphtheria, and acellular pertussis (Tdap, Td) vaccine. You may need a Td booster every 10 years.  Zoster vaccine. You may need this after age 17.  Pneumococcal 13-valent conjugate (PCV13) vaccine. One dose is recommended after age 68.  Pneumococcal polysaccharide (PPSV23) vaccine. One dose is  recommended after age 37. Talk to your health care provider about which screenings and vaccines you need and how often you need them. This information is not intended to replace advice given to you by your health care provider. Make sure you discuss any questions you have with your health care provider. Document Released: 11/11/2015 Document Revised: 07/04/2016 Document Reviewed: 08/16/2015 Elsevier Interactive Patient Education  2017 [place] Prevention in the Home Falls can cause injuries. They can happen to people of all ages. There are many things you can do to make your home safe and to help prevent falls. What can I do on the outside of my home?  Regularly fix the edges of walkways and driveways and fix any cracks.  Remove anything that might make you trip as you walk through a door, such as a raised step or threshold.  Trim any bushes or trees on the path to your home.  Use bright outdoor lighting.  Clear any walking paths of anything that might make someone trip, such as rocks or tools.  Regularly check to see if handrails are loose or broken. Make sure that both sides of any steps have handrails.  Any raised decks and porches should have guardrails on the edges.  Have any leaves, snow, or ice cleared regularly.  Use sand or salt on walking paths during winter.  Clean up any spills in your garage right away. This includes oil or grease spills. What can I do in the bathroom?  Use night lights.  Install grab bars by the toilet and in the tub and shower. Do not use towel bars as grab bars.  Use non-skid mats or decals in the tub or shower.  If you need to sit down in the shower, use a plastic, non-slip stool.  Keep the floor dry. Clean up any water that spills on the floor as soon as it happens.  Remove soap buildup in the tub or shower regularly.  Attach bath mats securely with double-sided non-slip rug tape.  Do not have throw rugs and other things on  the floor that can make you trip. What can I do in the bedroom?  Use night lights.  Make sure that you have a light by your bed that is easy to reach.  Do not use any sheets or blankets that are too big for your bed. They should not hang down onto the floor.  Have a firm chair that has side arms. You can use this for support while you get dressed.  Do not have throw rugs and other things on the floor that can make you trip. What can I do in the kitchen?  Clean up any spills right away.  Avoid walking on wet floors.  Keep items that you use a lot in easy-to-reach places.  If you need to reach something above you, use a strong step stool that has a grab bar.  Keep electrical cords out of the way.  Do not use floor polish or wax that makes floors slippery. If you must use wax, use non-skid floor wax.  Do not have throw rugs and other things on the floor that can  make you trip. What can I do with my stairs?  Do not leave any items on the stairs.  Make sure that there are handrails on both sides of the stairs and use them. Fix handrails that are broken or loose. Make sure that handrails are as long as the stairways.  Check any carpeting to make sure that it is firmly attached to the stairs. Fix any carpet that is loose or worn.  Avoid having throw rugs at the top or bottom of the stairs. If you do have throw rugs, attach them to the floor with carpet tape.  Make sure that you have a light switch at the top of the stairs and the bottom of the stairs. If you do not have them, ask someone to add them for you. What else can I do to help prevent falls?  Wear shoes that:  Do not have high heels.  Have rubber bottoms.  Are comfortable and fit you well.  Are closed at the toe. Do not wear sandals.  If you use a stepladder:  Make sure that it is fully opened. Do not climb a closed stepladder.  Make sure that both sides of the stepladder are locked into place.  Ask someone to  hold it for you, if possible.  Clearly mark and make sure that you can see:  Any grab bars or handrails.  First and last steps.  Where the edge of each step is.  Use tools that help you move around (mobility aids) if they are needed. These include:  Canes.  Walkers.  Scooters.  Crutches.  Turn on the lights when you go into a dark area. Replace any light bulbs as soon as they burn out.  Set up your furniture so you have a clear path. Avoid moving your furniture around.  If any of your floors are uneven, fix them.  If there are any pets around you, be aware of where they are.  Review your medicines with your doctor. Some medicines can make you feel dizzy. This can increase your chance of falling. Ask your doctor what other things that you can do to help prevent falls. This information is not intended to replace advice given to you by your health care provider. Make sure you discuss any questions you have with your health care provider. Document Released: 08/11/2009 Document Revised: 03/22/2016 Document Reviewed: 11/19/2014 Elsevier Interactive Patient Education  2017 [employer].
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
## 2020-01-25 ENCOUNTER — Other Ambulatory Visit: Payer: Self-pay | Admitting: Internal Medicine

## 2020-01-25 ENCOUNTER — Other Ambulatory Visit: Payer: Self-pay | Admitting: Nurse Practitioner

## 2020-01-25 NOTE — Telephone Encounter (Signed)
rx sent to pharmacy by e-script  

## 2020-02-16 DIAGNOSIS — D649 Anemia, unspecified: Secondary | ICD-10-CM | POA: Diagnosis not present

## 2020-02-16 DIAGNOSIS — E876 Hypokalemia: Secondary | ICD-10-CM | POA: Diagnosis not present

## 2020-02-16 DIAGNOSIS — I10 Essential (primary) hypertension: Secondary | ICD-10-CM | POA: Diagnosis not present

## 2020-02-16 DIAGNOSIS — Z79899 Other long term (current) drug therapy: Secondary | ICD-10-CM | POA: Diagnosis not present

## 2020-02-16 DIAGNOSIS — E785 Hyperlipidemia, unspecified: Secondary | ICD-10-CM | POA: Diagnosis not present

## 2020-02-16 LAB — BASIC METABOLIC PANEL
BUN: 20 (ref 4–21)
CO2: 25 — AB (ref 13–22)
Chloride: 103 (ref 99–108)
Creatinine: 1 (ref 0.5–1.1)
Glucose: 87
Potassium: 3.9 (ref 3.4–5.3)
Sodium: 141 (ref 137–147)

## 2020-02-16 LAB — COMPREHENSIVE METABOLIC PANEL
Albumin: 4.1 (ref 3.5–5.0)
Calcium: 10.4 (ref 8.7–10.7)

## 2020-02-16 LAB — HEPATIC FUNCTION PANEL
ALT: 12 (ref 7–35)
AST: 18 (ref 13–35)

## 2020-02-16 LAB — CBC AND DIFFERENTIAL
HCT: 40 (ref 36–46)
Hemoglobin: 13.3 (ref 12.0–16.0)
Platelets: 197 (ref 150–399)
WBC: 4.8

## 2020-02-16 LAB — CBC: RBC: 4.21 (ref 3.87–5.11)

## 2020-02-16 LAB — LIPID PANEL
Cholesterol: 198 (ref 0–200)
HDL: 74 — AB (ref 35–70)
LDL Cholesterol: 118
Triglycerides: 85 (ref 40–160)

## 2020-02-22 ENCOUNTER — Other Ambulatory Visit: Payer: Self-pay | Admitting: Internal Medicine

## 2020-02-22 NOTE — Telephone Encounter (Signed)
Patient has not had a TSH since 09/23/2018 before I refill it this time, I last refilled it in march not knowing she needed a TSH. Please advise

## 2020-02-23 ENCOUNTER — Encounter: Payer: Self-pay | Admitting: Internal Medicine

## 2020-02-23 NOTE — Telephone Encounter (Signed)
My note says she was to have a cbc and cmp before her visit tomorrow at [facility].  Do you have her labs?  Maybe the TSH could be added.  If she did not get labs, we can have her get them after her visit.  Just fill the levothyroxine for one month for now.

## 2020-02-23 NOTE — Telephone Encounter (Signed)
Yes her labs are in for tomorrow's visit and I just sent in the script for 30 days

## 2020-02-24 ENCOUNTER — Encounter: Payer: Self-pay | Admitting: Internal Medicine

## 2020-02-24 ENCOUNTER — Other Ambulatory Visit: Payer: Self-pay

## 2020-02-24 ENCOUNTER — Non-Acute Institutional Stay: Payer: Medicare Other | Admitting: Internal Medicine

## 2020-02-24 VITALS — BP 118/68 | HR 79 | Temp 97.8°F | Ht 65.0 in | Wt 137.4 lb

## 2020-02-24 DIAGNOSIS — N83201 Unspecified ovarian cyst, right side: Secondary | ICD-10-CM

## 2020-02-24 DIAGNOSIS — I2510 Atherosclerotic heart disease of native coronary artery without angina pectoris: Secondary | ICD-10-CM | POA: Diagnosis not present

## 2020-02-24 DIAGNOSIS — I4821 Permanent atrial fibrillation: Secondary | ICD-10-CM | POA: Diagnosis not present

## 2020-02-24 DIAGNOSIS — I872 Venous insufficiency (chronic) (peripheral): Secondary | ICD-10-CM

## 2020-02-24 DIAGNOSIS — H353221 Exudative age-related macular degeneration, left eye, with active choroidal neovascularization: Secondary | ICD-10-CM

## 2020-02-24 DIAGNOSIS — D509 Iron deficiency anemia, unspecified: Secondary | ICD-10-CM | POA: Diagnosis not present

## 2020-02-24 DIAGNOSIS — I4891 Unspecified atrial fibrillation: Secondary | ICD-10-CM | POA: Diagnosis not present

## 2020-02-24 DIAGNOSIS — D6869 Other thrombophilia: Secondary | ICD-10-CM | POA: Diagnosis not present
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
## 2020-02-26 ENCOUNTER — Other Ambulatory Visit: Payer: Self-pay | Admitting: Internal Medicine

## 2020-02-26 DIAGNOSIS — F419 Anxiety disorder, unspecified: Secondary | ICD-10-CM

## 2020-02-26 NOTE — Telephone Encounter (Signed)
Patient requesting refill on medication through pharmacy ePrescription.  Medication sent to provider Ngetich, Dinah, due to [employer], DO being out of office. Please Advise.

## 2020-03-07 DIAGNOSIS — R198 Other specified symptoms and signs involving the digestive system and abdomen: Secondary | ICD-10-CM | POA: Diagnosis not present

## 2020-03-07 DIAGNOSIS — R634 Abnormal weight loss: Secondary | ICD-10-CM | POA: Diagnosis not present

## 2020-03-07 DIAGNOSIS — K52832 Lymphocytic colitis: Secondary | ICD-10-CM | POA: Diagnosis not present

## 2020-03-07 DIAGNOSIS — R935 Abnormal findings on diagnostic imaging of other abdominal regions, including retroperitoneum: Secondary | ICD-10-CM | POA: Diagnosis not present
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
## 2020-03-15 ENCOUNTER — Other Ambulatory Visit: Payer: Self-pay

## 2020-03-15 ENCOUNTER — Ambulatory Visit (INDEPENDENT_AMBULATORY_CARE_PROVIDER_SITE_OTHER): Payer: Medicare Other | Admitting: Nurse Practitioner

## 2020-03-15 ENCOUNTER — Encounter: Payer: Self-pay | Admitting: Nurse Practitioner

## 2020-03-15 VITALS — BP 110/62 | HR 69 | Ht 65.0 in | Wt 138.8 lb

## 2020-03-15 DIAGNOSIS — I10 Essential (primary) hypertension: Secondary | ICD-10-CM | POA: Diagnosis not present

## 2020-03-15 DIAGNOSIS — Z79899 Other long term (current) drug therapy: Secondary | ICD-10-CM | POA: Diagnosis not present

## 2020-03-15 DIAGNOSIS — I5032 Chronic diastolic (congestive) heart failure: Secondary | ICD-10-CM | POA: Diagnosis not present

## 2020-03-15 DIAGNOSIS — I4819 Other persistent atrial fibrillation: Secondary | ICD-10-CM | POA: Diagnosis not present

## 2020-03-15 DIAGNOSIS — I2510 Atherosclerotic heart disease of native coronary artery without angina pectoris: Secondary | ICD-10-CM | POA: Diagnosis not present

## 2020-03-15 DIAGNOSIS — Z7901 Long term (current) use of anticoagulants: Secondary | ICD-10-CM

## 2020-03-15 NOTE — Patient Instructions (Addendum)
After Visit Summary:  We will be checking the following labs today - NONE   Medication Instructions:    Continue with your current medicines.    If you need a refill on your cardiac medications before your next appointment, please call your pharmacy.     Testing/Procedures To Be Arranged:  N/A  Follow-Up:   See me in 4 months    At [facility], you and your health needs are our priority.  As part of our continuing mission to provide you with exceptional heart care, we have created designated Provider Care Teams.  These Care Teams include your primary Cardiologist (physician) and Advanced Practice Providers (APPs -  Physician Assistants and Nurse Practitioners) who all work together to provide you with the care you need, when you need it.  Special Instructions:  . Stay safe, stay home, wash your hands for at least 20 seconds and wear a mask when out in public.  . It was good to talk with you today.  Marland Kitchen Keep up the good work!   Call the [place] office at [phone] if you have any questions, problems or concerns.

## 2020-03-27 ENCOUNTER — Other Ambulatory Visit: Payer: Self-pay | Admitting: Internal Medicine

## 2020-04-22 ENCOUNTER — Other Ambulatory Visit: Payer: Self-pay | Admitting: Internal Medicine

## 2020-05-04 DIAGNOSIS — I6789 Other cerebrovascular disease: Secondary | ICD-10-CM | POA: Diagnosis not present

## 2020-05-04 DIAGNOSIS — I5032 Chronic diastolic (congestive) heart failure: Secondary | ICD-10-CM | POA: Diagnosis not present

## 2020-05-04 DIAGNOSIS — H353221 Exudative age-related macular degeneration, left eye, with active choroidal neovascularization: Secondary | ICD-10-CM | POA: Diagnosis not present

## 2020-05-04 DIAGNOSIS — R2689 Other abnormalities of gait and mobility: Secondary | ICD-10-CM | POA: Diagnosis not present

## 2020-05-04 DIAGNOSIS — M6389 Disorders of muscle in diseases classified elsewhere, multiple sites: Secondary | ICD-10-CM | POA: Diagnosis not present

## 2020-05-17 DIAGNOSIS — H353114 Nonexudative age-related macular degeneration, right eye, advanced atrophic with subfoveal involvement: Secondary | ICD-10-CM | POA: Diagnosis not present

## 2020-05-17 DIAGNOSIS — H35372 Puckering of macula, left eye: Secondary | ICD-10-CM | POA: Diagnosis not present

## 2020-05-17 DIAGNOSIS — H18423 Band keratopathy, bilateral: Secondary | ICD-10-CM | POA: Diagnosis not present

## 2020-05-17 DIAGNOSIS — Z961 Presence of intraocular lens: Secondary | ICD-10-CM | POA: Diagnosis not present

## 2020-05-17 DIAGNOSIS — H353221 Exudative age-related macular degeneration, left eye, with active choroidal neovascularization: Secondary | ICD-10-CM | POA: Diagnosis not present

## 2020-05-17 DIAGNOSIS — H43813 Vitreous degeneration, bilateral: Secondary | ICD-10-CM | POA: Diagnosis not present

## 2020-05-26 ENCOUNTER — Other Ambulatory Visit: Payer: Self-pay | Admitting: Nurse Practitioner

## 2020-05-26 ENCOUNTER — Other Ambulatory Visit: Payer: Self-pay | Admitting: Internal Medicine

## 2020-05-26 DIAGNOSIS — G609 Hereditary and idiopathic neuropathy, unspecified: Secondary | ICD-10-CM

## 2020-05-26 MED ORDER — PREGABALIN 50 MG PO CAPS: ORAL_CAPSULE | ORAL | 5 refills | Status: DC

## 2020-05-26 NOTE — Telephone Encounter (Signed)
Received rx request from Pharmacy.  Pended Rx and sent to Dr. Mariea Clonts for approval.

## 2020-05-30 ENCOUNTER — Other Ambulatory Visit: Payer: Self-pay | Admitting: Family

## 2020-05-30 DIAGNOSIS — F419 Anxiety disorder, unspecified: Secondary | ICD-10-CM

## 2020-05-30 NOTE — Telephone Encounter (Signed)
Routed to Dr. Reed for approval  

## 2020-06-09 ENCOUNTER — Telehealth: Payer: Self-pay

## 2020-06-09 NOTE — Telephone Encounter (Signed)
Incoming call received from patient stating her caregiver whom she was in close contact with on Tuesday has symptoms of coivd, caregiver was suppose to get tested yet Debra Brooks is unaware of her results. Caregiver called out yesterday.  Patient is asymptomatic, yet questions what she should do for the staff at [facility] is giving "mixed signals."   Please advise

## 2020-06-09 NOTE — Telephone Encounter (Signed)
Bernadette with [facility] called and stated that she tested patient for Covid due to a Positive Exposure.

## 2020-06-09 NOTE — Telephone Encounter (Signed)
Per verbal conversation with Dr.Reed if any of her patients at [facility] would like to get tested they can get tested.   Patient aware and plans to contact the [place] nurse at [facility] to set up testing.

## 2020-06-29 ENCOUNTER — Encounter: Payer: Self-pay | Admitting: Internal Medicine

## 2020-06-29 ENCOUNTER — Non-Acute Institutional Stay: Payer: Medicare Other | Admitting: Internal Medicine

## 2020-06-29 ENCOUNTER — Other Ambulatory Visit: Payer: Self-pay

## 2020-06-29 VITALS — BP 122/78 | HR 75 | Temp 98.1°F | Ht 65.0 in | Wt 139.2 lb

## 2020-06-29 DIAGNOSIS — H353221 Exudative age-related macular degeneration, left eye, with active choroidal neovascularization: Secondary | ICD-10-CM

## 2020-06-29 DIAGNOSIS — N83201 Unspecified ovarian cyst, right side: Secondary | ICD-10-CM

## 2020-06-29 DIAGNOSIS — I4821 Permanent atrial fibrillation: Secondary | ICD-10-CM | POA: Diagnosis not present

## 2020-06-29 DIAGNOSIS — I2510 Atherosclerotic heart disease of native coronary artery without angina pectoris: Secondary | ICD-10-CM | POA: Diagnosis not present

## 2020-06-29 DIAGNOSIS — R198 Other specified symptoms and signs involving the digestive system and abdomen: Secondary | ICD-10-CM

## 2020-06-29 DIAGNOSIS — I4891 Unspecified atrial fibrillation: Secondary | ICD-10-CM | POA: Diagnosis not present

## 2020-06-29 DIAGNOSIS — I872 Venous insufficiency (chronic) (peripheral): Secondary | ICD-10-CM

## 2020-06-29 DIAGNOSIS — D6869 Other thrombophilia: Secondary | ICD-10-CM | POA: Diagnosis not present
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
## 2020-07-04 ENCOUNTER — Emergency Department (HOSPITAL_COMMUNITY): Payer: Medicare Other

## 2020-07-04 ENCOUNTER — Other Ambulatory Visit: Payer: Self-pay

## 2020-07-04 ENCOUNTER — Inpatient Hospital Stay (HOSPITAL_COMMUNITY)
Admission: EM | Admit: 2020-07-04 | Discharge: 2020-07-07 | DRG: 291 | Disposition: A | Discharge status: Skilled Nursing Facility | Payer: Medicare Other | Attending: Internal Medicine | Admitting: Internal Medicine

## 2020-07-04 DIAGNOSIS — I499 Cardiac arrhythmia, unspecified: Secondary | ICD-10-CM | POA: Diagnosis not present

## 2020-07-04 DIAGNOSIS — Z7901 Long term (current) use of anticoagulants: Secondary | ICD-10-CM

## 2020-07-04 DIAGNOSIS — I5043 Acute on chronic combined systolic (congestive) and diastolic (congestive) heart failure: Secondary | ICD-10-CM | POA: Diagnosis not present

## 2020-07-04 DIAGNOSIS — I4892 Unspecified atrial flutter: Secondary | ICD-10-CM | POA: Diagnosis present

## 2020-07-04 DIAGNOSIS — I452 Bifascicular block: Secondary | ICD-10-CM | POA: Diagnosis present

## 2020-07-04 DIAGNOSIS — F329 Major depressive disorder, single episode, unspecified: Secondary | ICD-10-CM | POA: Diagnosis present

## 2020-07-04 DIAGNOSIS — J80 Acute respiratory distress syndrome: Secondary | ICD-10-CM | POA: Diagnosis not present

## 2020-07-04 DIAGNOSIS — E785 Hyperlipidemia, unspecified: Secondary | ICD-10-CM | POA: Diagnosis present

## 2020-07-04 DIAGNOSIS — J9601 Acute respiratory failure with hypoxia: Secondary | ICD-10-CM | POA: Diagnosis present

## 2020-07-04 DIAGNOSIS — Z85038 Personal history of other malignant neoplasm of large intestine: Secondary | ICD-10-CM | POA: Diagnosis not present

## 2020-07-04 DIAGNOSIS — I509 Heart failure, unspecified: Secondary | ICD-10-CM | POA: Diagnosis not present

## 2020-07-04 DIAGNOSIS — I491 Atrial premature depolarization: Secondary | ICD-10-CM | POA: Diagnosis not present

## 2020-07-04 DIAGNOSIS — Z87891 Personal history of nicotine dependence: Secondary | ICD-10-CM | POA: Diagnosis not present

## 2020-07-04 DIAGNOSIS — I083 Combined rheumatic disorders of mitral, aortic and tricuspid valves: Secondary | ICD-10-CM | POA: Diagnosis present

## 2020-07-04 DIAGNOSIS — N183 Chronic kidney disease, stage 3 unspecified: Secondary | ICD-10-CM | POA: Diagnosis present

## 2020-07-04 DIAGNOSIS — I2510 Atherosclerotic heart disease of native coronary artery without angina pectoris: Secondary | ICD-10-CM | POA: Diagnosis present

## 2020-07-04 DIAGNOSIS — Z881 Allergy status to other antibiotic agents status: Secondary | ICD-10-CM

## 2020-07-04 DIAGNOSIS — R0602 Shortness of breath: Secondary | ICD-10-CM | POA: Diagnosis not present

## 2020-07-04 DIAGNOSIS — N83291 Other ovarian cyst, right side: Secondary | ICD-10-CM | POA: Diagnosis not present

## 2020-07-04 DIAGNOSIS — N838 Other noninflammatory disorders of ovary, fallopian tube and broad ligament: Secondary | ICD-10-CM

## 2020-07-04 DIAGNOSIS — I472 Ventricular tachycardia: Secondary | ICD-10-CM | POA: Diagnosis present

## 2020-07-04 DIAGNOSIS — I482 Chronic atrial fibrillation, unspecified: Secondary | ICD-10-CM | POA: Diagnosis present

## 2020-07-04 DIAGNOSIS — Z8249 Family history of ischemic heart disease and other diseases of the circulatory system: Secondary | ICD-10-CM

## 2020-07-04 DIAGNOSIS — R1909 Other intra-abdominal and pelvic swelling, mass and lump: Secondary | ICD-10-CM | POA: Diagnosis not present

## 2020-07-04 DIAGNOSIS — K2100 Gastro-esophageal reflux disease with esophagitis, without bleeding: Secondary | ICD-10-CM | POA: Diagnosis present

## 2020-07-04 DIAGNOSIS — Z66 Do not resuscitate: Secondary | ICD-10-CM | POA: Diagnosis present

## 2020-07-04 DIAGNOSIS — Z91041 Radiographic dye allergy status: Secondary | ICD-10-CM

## 2020-07-04 DIAGNOSIS — F419 Anxiety disorder, unspecified: Secondary | ICD-10-CM | POA: Diagnosis present

## 2020-07-04 DIAGNOSIS — N839 Noninflammatory disorder of ovary, fallopian tube and broad ligament, unspecified: Secondary | ICD-10-CM | POA: Diagnosis present

## 2020-07-04 DIAGNOSIS — K559 Vascular disorder of intestine, unspecified: Secondary | ICD-10-CM | POA: Diagnosis present

## 2020-07-04 DIAGNOSIS — M810 Age-related osteoporosis without current pathological fracture: Secondary | ICD-10-CM | POA: Diagnosis present

## 2020-07-04 DIAGNOSIS — I4819 Other persistent atrial fibrillation: Secondary | ICD-10-CM | POA: Diagnosis present

## 2020-07-04 DIAGNOSIS — Z79890 Hormone replacement therapy (postmenopausal): Secondary | ICD-10-CM

## 2020-07-04 DIAGNOSIS — N858 Other specified noninflammatory disorders of uterus: Secondary | ICD-10-CM | POA: Diagnosis not present

## 2020-07-04 DIAGNOSIS — R008 Other abnormalities of heart beat: Secondary | ICD-10-CM | POA: Diagnosis present

## 2020-07-04 DIAGNOSIS — I872 Venous insufficiency (chronic) (peripheral): Secondary | ICD-10-CM | POA: Diagnosis present

## 2020-07-04 DIAGNOSIS — Z888 Allergy status to other drugs, medicaments and biological substances status: Secondary | ICD-10-CM

## 2020-07-04 DIAGNOSIS — I4510 Unspecified right bundle-branch block: Secondary | ICD-10-CM | POA: Diagnosis not present

## 2020-07-04 DIAGNOSIS — I214 Non-ST elevation (NSTEMI) myocardial infarction: Secondary | ICD-10-CM

## 2020-07-04 DIAGNOSIS — Z20822 Contact with and (suspected) exposure to covid-19: Secondary | ICD-10-CM | POA: Diagnosis present

## 2020-07-04 DIAGNOSIS — D509 Iron deficiency anemia, unspecified: Secondary | ICD-10-CM | POA: Diagnosis present

## 2020-07-04 DIAGNOSIS — Z853 Personal history of malignant neoplasm of breast: Secondary | ICD-10-CM

## 2020-07-04 DIAGNOSIS — J439 Emphysema, unspecified: Secondary | ICD-10-CM | POA: Diagnosis present

## 2020-07-04 DIAGNOSIS — R079 Chest pain, unspecified: Secondary | ICD-10-CM | POA: Diagnosis not present

## 2020-07-04 DIAGNOSIS — Z9049 Acquired absence of other specified parts of digestive tract: Secondary | ICD-10-CM

## 2020-07-04 DIAGNOSIS — Z809 Family history of malignant neoplasm, unspecified: Secondary | ICD-10-CM

## 2020-07-04 DIAGNOSIS — Z79899 Other long term (current) drug therapy: Secondary | ICD-10-CM

## 2020-07-04 DIAGNOSIS — I517 Cardiomegaly: Secondary | ICD-10-CM | POA: Diagnosis not present

## 2020-07-04 DIAGNOSIS — J90 Pleural effusion, not elsewhere classified: Secondary | ICD-10-CM | POA: Diagnosis not present

## 2020-07-04 DIAGNOSIS — I5033 Acute on chronic diastolic (congestive) heart failure: Secondary | ICD-10-CM | POA: Diagnosis not present

## 2020-07-04 DIAGNOSIS — E039 Hypothyroidism, unspecified: Secondary | ICD-10-CM | POA: Diagnosis present

## 2020-07-04 DIAGNOSIS — J9811 Atelectasis: Secondary | ICD-10-CM | POA: Diagnosis present

## 2020-07-04 DIAGNOSIS — N1832 Chronic kidney disease, stage 3b: Secondary | ICD-10-CM | POA: Diagnosis present

## 2020-07-04 DIAGNOSIS — G2581 Restless legs syndrome: Secondary | ICD-10-CM | POA: Diagnosis present

## 2020-07-04 DIAGNOSIS — I10 Essential (primary) hypertension: Secondary | ICD-10-CM | POA: Diagnosis not present

## 2020-07-04 DIAGNOSIS — I110 Hypertensive heart disease with heart failure: Secondary | ICD-10-CM | POA: Diagnosis not present

## 2020-07-04 DIAGNOSIS — I130 Hypertensive heart and chronic kidney disease with heart failure and stage 1 through stage 4 chronic kidney disease, or unspecified chronic kidney disease: Secondary | ICD-10-CM | POA: Diagnosis present

## 2020-07-04 DIAGNOSIS — E876 Hypokalemia: Secondary | ICD-10-CM | POA: Diagnosis present

## 2020-07-04 DIAGNOSIS — J811 Chronic pulmonary edema: Secondary | ICD-10-CM | POA: Diagnosis not present

## 2020-07-04 LAB — I-STAT CHEM 8, ED
BUN: 24 mg/dL — ABNORMAL HIGH (ref 8–23)
Calcium, Ion: 1.22 mmol/L (ref 1.15–1.40)
Chloride: 103 mmol/L (ref 98–111)
Creatinine, Ser: 1.3 mg/dL — ABNORMAL HIGH (ref 0.44–1.00)
Glucose, Bld: 128 mg/dL — ABNORMAL HIGH (ref 70–99)
HCT: 41 % (ref 36.0–46.0)
Hemoglobin: 13.9 g/dL (ref 12.0–15.0)
Potassium: 4.1 mmol/L (ref 3.5–5.1)
Sodium: 141 mmol/L (ref 135–145)
TCO2: 26 mmol/L (ref 22–32)

## 2020-07-04 LAB — COMPREHENSIVE METABOLIC PANEL
ALT: 25 U/L (ref 0–44)
AST: 30 U/L (ref 15–41)
Albumin: 3.5 g/dL (ref 3.5–5.0)
Alkaline Phosphatase: 74 U/L (ref 38–126)
Anion gap: 12 (ref 5–15)
BUN: 22 mg/dL (ref 8–23)
CO2: 27 mmol/L (ref 22–32)
Calcium: 10.1 mg/dL (ref 8.9–10.3)
Chloride: 102 mmol/L (ref 98–111)
Creatinine, Ser: 1.38 mg/dL — ABNORMAL HIGH (ref 0.44–1.00)
GFR calc Af Amer: 38 mL/min — ABNORMAL LOW (ref >60)
GFR calc non Af Amer: 33 mL/min — ABNORMAL LOW (ref >60)
Glucose, Bld: 132 mg/dL — ABNORMAL HIGH (ref 70–99)
Potassium: 4.3 mmol/L (ref 3.5–5.1)
Sodium: 141 mmol/L (ref 135–145)
Total Bilirubin: 0.9 mg/dL (ref 0.3–1.2)
Total Protein: 5.9 g/dL — ABNORMAL LOW (ref 6.5–8.1)

## 2020-07-04 LAB — CBC WITH DIFFERENTIAL/PLATELET
Abs Immature Granulocytes: 0.02 10*3/uL (ref 0.00–0.07)
Basophils Absolute: 0.1 10*3/uL (ref 0.0–0.1)
Basophils Relative: 1 %
Eosinophils Absolute: 0.2 10*3/uL (ref 0.0–0.5)
Eosinophils Relative: 3 %
HCT: 43.4 % (ref 36.0–46.0)
Hemoglobin: 13.8 g/dL (ref 12.0–15.0)
Immature Granulocytes: 0 %
Lymphocytes Relative: 7 %
Lymphs Abs: 0.6 10*3/uL — ABNORMAL LOW (ref 0.7–4.0)
MCH: 31.6 pg (ref 26.0–34.0)
MCHC: 31.8 g/dL (ref 30.0–36.0)
MCV: 99.3 fL (ref 80.0–100.0)
Monocytes Absolute: 0.6 10*3/uL (ref 0.1–1.0)
Monocytes Relative: 6 %
Neutro Abs: 7.4 10*3/uL (ref 1.7–7.7)
Neutrophils Relative %: 83 %
Platelets: 208 10*3/uL (ref 150–400)
RBC: 4.37 MIL/uL (ref 3.87–5.11)
RDW: 13.4 % (ref 11.5–15.5)
WBC: 8.9 10*3/uL (ref 4.0–10.5)
nRBC: 0 % (ref 0.0–0.2)

## 2020-07-04 LAB — I-STAT ARTERIAL BLOOD GAS, ED
Acid-Base Excess: 6 mmol/L — ABNORMAL HIGH (ref 0.0–2.0)
Bicarbonate: 29.3 mmol/L — ABNORMAL HIGH (ref 20.0–28.0)
Calcium, Ion: 1.25 mmol/L (ref 1.15–1.40)
HCT: 39 % (ref 36.0–46.0)
Hemoglobin: 13.3 g/dL (ref 12.0–15.0)
O2 Saturation: 100 %
Potassium: 4.1 mmol/L (ref 3.5–5.1)
Sodium: 139 mmol/L (ref 135–145)
TCO2: 30 mmol/L (ref 22–32)
pCO2 arterial: 38.4 mmHg (ref 32.0–48.0)
pH, Arterial: 7.49 — ABNORMAL HIGH (ref 7.350–7.450)
pO2, Arterial: 178 mmHg — ABNORMAL HIGH (ref 83.0–108.0)

## 2020-07-04 LAB — TROPONIN I (HIGH SENSITIVITY): Troponin I (High Sensitivity): 68 ng/L — ABNORMAL HIGH (ref <18)

## 2020-07-04 LAB — BRAIN NATRIURETIC PEPTIDE: B Natriuretic Peptide: 488.5 pg/mL — ABNORMAL HIGH (ref 0.0–100.0)

## 2020-07-04 MED ORDER — FUROSEMIDE 10 MG/ML IJ SOLN
40.0000 mg | Freq: Once | INTRAMUSCULAR | Status: AC
  Administered 2020-07-05: 40 mg via INTRAVENOUS
  Filled 2020-07-04: qty 4

## 2020-07-04 MED ORDER — NITROGLYCERIN 2 % TD OINT: 0.5000 [in_us] | TOPICAL_OINTMENT | Freq: Once | TRANSDERMAL | Status: DC

## 2020-07-04 MED ORDER — LABETALOL HCL 5 MG/ML IV SOLN: 10.0000 mg | Freq: Once | INTRAVENOUS | Status: DC

## 2020-07-04 NOTE — ED Triage Notes (Signed)
Pt. From [facility] transferred [facility] c/o SOB. Per EMS, pt stated she was lying down then got up to eat and the SOB started. Also c/o cp and  BLE swelling.  Hx. HF, AFIB, breast cancer- R extremity restriction. Sats in the 80's RA on EMS arrival- 97 4L [place]. [facility] administer 1 nitro for cp.   Vitals:  B/p 198/119 HR  106
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
## 2020-07-05 ENCOUNTER — Encounter (HOSPITAL_COMMUNITY): Payer: Self-pay | Admitting: Internal Medicine

## 2020-07-05 ENCOUNTER — Telehealth: Payer: Self-pay | Admitting: Nurse Practitioner

## 2020-07-05 DIAGNOSIS — I10 Essential (primary) hypertension: Secondary | ICD-10-CM

## 2020-07-05 DIAGNOSIS — I509 Heart failure, unspecified: Secondary | ICD-10-CM

## 2020-07-05 DIAGNOSIS — I5033 Acute on chronic diastolic (congestive) heart failure: Secondary | ICD-10-CM

## 2020-07-05 DIAGNOSIS — I5043 Acute on chronic combined systolic (congestive) and diastolic (congestive) heart failure: Secondary | ICD-10-CM | POA: Diagnosis present

## 2020-07-05 DIAGNOSIS — I482 Chronic atrial fibrillation, unspecified: Secondary | ICD-10-CM

## 2020-07-05 DIAGNOSIS — J9601 Acute respiratory failure with hypoxia: Secondary | ICD-10-CM | POA: Diagnosis present

## 2020-07-05 LAB — MAGNESIUM: Magnesium: 2 mg/dL (ref 1.7–2.4)

## 2020-07-05 LAB — CBC
HCT: 41.1 % (ref 36.0–46.0)
Hemoglobin: 13 g/dL (ref 12.0–15.0)
MCH: 31.5 pg (ref 26.0–34.0)
MCHC: 31.6 g/dL (ref 30.0–36.0)
MCV: 99.5 fL (ref 80.0–100.0)
Platelets: 212 10*3/uL (ref 150–400)
RBC: 4.13 MIL/uL (ref 3.87–5.11)
RDW: 13.5 % (ref 11.5–15.5)
WBC: 8.6 10*3/uL (ref 4.0–10.5)
nRBC: 0 % (ref 0.0–0.2)

## 2020-07-05 LAB — TROPONIN I (HIGH SENSITIVITY)
Troponin I (High Sensitivity): 93 ng/L — ABNORMAL HIGH (ref <18)
Troponin I (High Sensitivity): 131 ng/L (ref <18)

## 2020-07-05 LAB — TSH: TSH: 0.802 u[IU]/mL (ref 0.350–4.500)

## 2020-07-05 LAB — SARS CORONAVIRUS 2 BY RT PCR (HOSPITAL ORDER, PERFORMED IN ~~LOC~~ HOSPITAL LAB): SARS Coronavirus 2: NEGATIVE

## 2020-07-05 LAB — DIGOXIN LEVEL: Digoxin Level: 0.4 ng/mL — ABNORMAL LOW (ref 0.8–2.0)

## 2020-07-05 LAB — MRSA PCR SCREENING: MRSA by PCR: NEGATIVE

## 2020-07-05 MED ORDER — APIXABAN 5 MG PO TABS: 5.0000 mg | ORAL_TABLET | Freq: Two times a day (BID) | ORAL | Status: DC

## 2020-07-05 MED ORDER — FUROSEMIDE 10 MG/ML IJ SOLN
20.0000 mg | Freq: Once | INTRAMUSCULAR | Status: AC
  Administered 2020-07-05: 20 mg via INTRAVENOUS
  Filled 2020-07-05: qty 2

## 2020-07-05 MED ORDER — MELATONIN 5 MG PO TABS
10.0000 mg | ORAL_TABLET | Freq: Every day | ORAL | Status: DC
  Administered 2020-07-05 – 2020-07-06 (×2): 10 mg via ORAL
  Filled 2020-07-05 (×3): qty 2

## 2020-07-05 MED ORDER — PREGABALIN 50 MG PO CAPS
50.0000 mg | ORAL_CAPSULE | Freq: Every day | ORAL | Status: DC
  Administered 2020-07-05 – 2020-07-07 (×3): 50 mg via ORAL
  Filled 2020-07-05 (×3): qty 1

## 2020-07-05 MED ORDER — DIGOXIN 125 MCG PO TABS
62.5000 ug | ORAL_TABLET | Freq: Every day | ORAL | Status: DC
  Administered 2020-07-05 – 2020-07-07 (×3): 62.5 ug via ORAL
  Filled 2020-07-05 (×3): qty 1

## 2020-07-05 MED ORDER — BUPROPION HCL ER (XL) 150 MG PO TB24
150.0000 mg | ORAL_TABLET | Freq: Every day | ORAL | Status: DC
  Administered 2020-07-05 – 2020-07-07 (×3): 150 mg via ORAL
  Filled 2020-07-05 (×3): qty 1

## 2020-07-05 MED ORDER — ALPRAZOLAM 0.25 MG PO TABS
0.2500 mg | ORAL_TABLET | Freq: Every evening | ORAL | Status: DC | PRN
  Administered 2020-07-06 (×2): 0.25 mg via ORAL
  Filled 2020-07-05 (×2): qty 1

## 2020-07-05 MED ORDER — BUDESONIDE 3 MG PO CPEP
3.0000 mg | ORAL_CAPSULE | Freq: Every day | ORAL | Status: DC
  Administered 2020-07-05 – 2020-07-06 (×2): 3 mg via ORAL
  Filled 2020-07-05 (×2): qty 1

## 2020-07-05 MED ORDER — APIXABAN 5 MG PO TABS
5.0000 mg | ORAL_TABLET | Freq: Two times a day (BID) | ORAL | Status: DC
  Administered 2020-07-05 – 2020-07-07 (×5): 5 mg via ORAL
  Filled 2020-07-05 (×5): qty 1

## 2020-07-05 MED ORDER — METOPROLOL TARTRATE 5 MG/5ML IV SOLN
5.0000 mg | Freq: Once | INTRAVENOUS | Status: AC
  Administered 2020-07-05: 5 mg via INTRAVENOUS
  Filled 2020-07-05: qty 5

## 2020-07-05 MED ORDER — CARVEDILOL 3.125 MG PO TABS
3.1250 mg | ORAL_TABLET | Freq: Two times a day (BID) | ORAL | Status: DC
  Administered 2020-07-05 – 2020-07-07 (×5): 3.125 mg via ORAL
  Filled 2020-07-05 (×5): qty 1

## 2020-07-05 MED ORDER — ONDANSETRON HCL 4 MG PO TABS: 4.0000 mg | ORAL_TABLET | Freq: Four times a day (QID) | ORAL | Status: DC | PRN

## 2020-07-05 MED ORDER — LEVOTHYROXINE SODIUM 75 MCG PO TABS
75.0000 ug | ORAL_TABLET | Freq: Every day | ORAL | Status: DC
  Administered 2020-07-05 – 2020-07-07 (×3): 75 ug via ORAL
  Filled 2020-07-05 (×3): qty 1

## 2020-07-05 MED ORDER — ONDANSETRON HCL 4 MG/2ML IJ SOLN: 4.0000 mg | Freq: Four times a day (QID) | INTRAMUSCULAR | Status: DC | PRN

## 2020-07-05 MED ORDER — DILTIAZEM LOAD VIA INFUSION
15.0000 mg | Freq: Once | INTRAVENOUS | Status: DC
  Filled 2020-07-05: qty 15

## 2020-07-05 MED ORDER — URSODIOL 500 MG PO TABS: 250.0000 mg | ORAL_TABLET | Freq: Two times a day (BID) | ORAL | Status: DC

## 2020-07-05 MED ORDER — FUROSEMIDE 10 MG/ML IJ SOLN
40.0000 mg | Freq: Two times a day (BID) | INTRAMUSCULAR | Status: DC
  Administered 2020-07-05 – 2020-07-06 (×3): 40 mg via INTRAVENOUS
  Filled 2020-07-05 (×3): qty 4

## 2020-07-05 MED ORDER — NITROGLYCERIN 0.4 MG SL SUBL: 0.4000 mg | SUBLINGUAL_TABLET | SUBLINGUAL | Status: DC | PRN

## 2020-07-05 MED ORDER — DILTIAZEM HCL-DEXTROSE 125-5 MG/125ML-% IV SOLN (PREMIX)
5.0000 mg/h | INTRAVENOUS | Status: DC
  Filled 2020-07-05: qty 125

## 2020-07-05 MED ORDER — URSODIOL 300 MG PO CAPS
300.0000 mg | ORAL_CAPSULE | Freq: Two times a day (BID) | ORAL | Status: DC
  Administered 2020-07-05 – 2020-07-07 (×5): 300 mg via ORAL
  Filled 2020-07-05 (×6): qty 1

## 2020-07-05 NOTE — Telephone Encounter (Signed)
Returned call to pt's son, Shanon Brow.  I explained to him that Cecille Rubin was not in the office all week, and he didn't want me to send her a message, he said he would just deal with the doctors at the hospital.

## 2020-07-05 NOTE — ED Notes (Signed)
Pur wick placement failed. Pt wet. Pt changed and full linens changed
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
## 2020-07-05 NOTE — ED Provider Notes (Signed)
Called to see patient for respiratory distress and hypoxia.  She is holding for admission for pulmonary edema and atrial fibrillation.  She is gasping for breath on a nonrebreather and tachycardic to the 160s.  Rhythm is irregular.  Rales bilaterally.  Tachycardic irregular rhythm.  She received Lasix earlier.  Placed on BiPAP and will start Cardizem drip for rapid A. fib with RVR. EF is 45%.  Will give additional Lasix as well. She does have a DNR in place.   Discussed with Dr. Hal Hope at bedside   EKG Interpretation  Date/Time:  Tuesday July 05 2020 00:59:55 EDT Ventricular Rate:  160 PR Interval:    QRS Duration: 164 QT Interval:  322 QTC Calculation: 509 R Axis:   -54 Text Interpretation: Poor quality data, interpretation may be affected Atrial fibrillation with RVR Right bundle branch block Confirmed by Ezequiel Essex [phone]) on 07/05/2020 1:10:20 AM        CRITICAL CARE Performed by: Ezequiel Essex Total critical care time: 35 minutes Critical care time was exclusive of separately billable procedures and treating other patients. Critical care was necessary to treat or prevent imminent or life-threatening deterioration. Critical care was time spent personally by me on the following activities: development of treatment plan with patient and/or surrogate as well as nursing, discussions with consultants, evaluation of patient's response to treatment, examination of patient, obtaining history from patient or surrogate, ordering and performing treatments and interventions, ordering and review of laboratory studies, ordering and review of radiographic studies, pulse oximetry and re-evaluation of patient's condition.    Ezequiel Essex, MD 07/05/20 [phone]

## 2020-07-05 NOTE — Telephone Encounter (Signed)
Patient's son calling in to speak with Truitt Merle. His mother was admitted to the hospital and he would like to speak with her regarding this. Please advise.
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
## 2020-07-05 NOTE — Progress Notes (Signed)
HPI: Debra Brooks is a 86 y.o. female with history of chronic systolic and diastolic CHF last EF measured was 45 to 50% with grade 2 diastolic dysfunction in 7473, atrial flutter status post ablation presently in A. fib on apixaban, CAD managed medically, chronic kidney disease, hypertension, history of: Breast cancer in remission started developing sudden onset of shortness of breath last evening at the facility when patient got up to eat something.  At that time patient also had some chest pressure.  ED Course: In the ER, systolic BP >403, chest x-ray showing bilateral pleural effusion with congestion.  EKG was showing A. Fib.  Admitted for acute on chronic combined systolic and diastolic CHF and acute hypoxic respiratory failure.     07/05/20: Seen and examined at bedside.  Reports dyspnea with minimal exertion.  Not on oxygen supplementation at baseline.  Currently on 4 L [place] with O2 saturation in the mid 90s on the monitor in the room.  Independently reviewed chest x-ray done on 07/04/2020 which showed cardiomegaly with increased pulmonary vascularity and bilateral pleural effusion.  Ongoing diuresing.  Cardiology has been consulted.  Please refer to H&P dictated by my partner Dr. Hal Hope on 07/05/2020 for further details of the assessment and plan.

## 2020-07-05 NOTE — Care Management (Signed)
Heart failure home health screen completed.  Case manager discussed PCP, weighing daily, diet, medications and transportation.  Patient reports that does have PCP and cardiologist, weighs once a week due to needed assistance with this, diet-does not add salt to diet, able to obtain and take medications as prescribed and able to get to and from medical appointments as needed.  Adding request for Physical Therapy evaluation due to unstable at times.

## 2020-07-05 NOTE — Evaluation (Signed)
Physical Therapy Evaluation Patient Details Name: Debra Brooks MRN: [id number] DOB: [birth year] Today's Date: 07/05/2020   History of Present Illness  86 yo admitted with SOB with Afib with RVR from [facility] independent [facility] facility. PMhx: CAD, CHF with EF 45%, aflutter, anxiety, breast CA, colon CA, HTN, HLD, spinal stenosis  Clinical Impression  Pt very pleasant and reports doing yoga at baseline and living at [facility] for grossly 15 years. Pt cognitively intact with good mobility and awareness of deficits. Pt currently limited by fatigue with HR 82-105 with activity and SpO2 94-97% on RA. Pt with decreased activity tolerance and balance who will benefit from acute therapy to maximize safety and independence. Recommend daily mobility with nursing staff and mobility tech.     Follow Up Recommendations Home health PT (pending progressive gait)    Equipment Recommendations  None recommended by PT    Recommendations for Other Services       Precautions / Restrictions Precautions Precautions: Fall      Mobility  Bed Mobility Overal bed mobility: Independent                Transfers Overall transfer level: Modified independent               General transfer comment: pt able to rise from bed without assist and from low toilet with railing  Ambulation/Gait Ambulation/Gait assistance: Supervision Gait Distance (Feet): 20 Feet Assistive device: Rolling walker (2 wheeled) Gait Pattern/deviations: Step-through pattern;Decreased stride length   Gait velocity interpretation: 1.31 - 2.62 ft/sec, indicative of limited community ambulator General Gait Details: difficulty steering RW as pt used to rollator with swivel wheels. pt walked 20' x 2 with seated rest to toilet. pt declined further gait  Stairs            Wheelchair Mobility    Modified Rankin (Stroke Patients Only)       Balance Overall balance assessment: Mild deficits observed, not formally  tested                                           Pertinent Vitals/Pain Pain Assessment: No/denies pain    Home Living Family/patient expects to be discharged to:: Private residence Living Arrangements: Alone Available Help at Discharge: Family;Available PRN/intermittently Type of Home: Apartment Home Access: Elevator     Home Layout: One level Home Equipment: Walker - 4 wheels;Grab bars - tub/shower;Grab bars - toilet;Shower seat;Cane - single point;Hand held shower head      Prior Function Level of Independence: Independent with assistive device(s)         Comments: yoga and scrabble. Staff does the cooking and cleaning, pt uses rollator in community     Hand Dominance        Extremity/Trunk Assessment   Upper Extremity Assessment Upper Extremity Assessment: Generalized weakness    Lower Extremity Assessment Lower Extremity Assessment: Generalized weakness    Cervical / Trunk Assessment Cervical / Trunk Assessment: Other exceptions Cervical / Trunk Exceptions: rounded shoulders  Communication   Communication: No difficulties  Cognition Arousal/Alertness: Awake/alert Behavior During Therapy: WFL for tasks assessed/performed Overall Cognitive Status: Within Functional Limits for tasks assessed  General Comments      Exercises     Assessment/Plan    PT Assessment Patient needs continued PT services  PT Problem List Decreased mobility;Decreased activity tolerance;Decreased balance       PT Treatment Interventions Gait training;Balance training;Functional mobility training;Patient/family education;Therapeutic activities    PT Goals (Current goals can be found in the Care Plan section)  Acute Rehab PT Goals Patient Stated Goal: return home and to yoga PT Goal Formulation: With patient Time For Goal Achievement: 07/19/20 Potential to Achieve Goals: Good    Frequency Min  3X/week   Barriers to discharge Decreased caregiver support      Co-evaluation               AM-PAC PT "6 Clicks" Mobility  Outcome Measure Help needed turning from your back to your side while in a flat bed without using bedrails?: None Help needed moving from lying on your back to sitting on the side of a flat bed without using bedrails?: None Help needed moving to and from a bed to a chair (including a wheelchair)?: None Help needed standing up from a chair using your arms (e.g., wheelchair or bedside chair)?: A Little Help needed to walk in hospital room?: A Little Help needed climbing 3-5 steps with a railing? : A Little 6 Click Score: 21    End of Session Equipment Utilized During Treatment: Gait belt Activity Tolerance: Patient tolerated treatment well Patient left: in chair;with call bell/phone within reach;with nursing/sitter in room;with chair alarm set Nurse Communication: Mobility status PT Visit Diagnosis: Other abnormalities of gait and mobility (R26.89)    Time: 7622-6333 PT Time Calculation (min) (ACUTE ONLY): 23 min   Charges:   PT Evaluation $PT Eval Moderate Complexity: 1 Mod          Althea Backs P, PT Acute Rehabilitation Services Pager: [phone] Office: [place] [facility] Rosalee Tolley 07/05/2020, 1:33 PM

## 2020-07-06 ENCOUNTER — Inpatient Hospital Stay (HOSPITAL_COMMUNITY): Payer: Medicare Other

## 2020-07-06 ENCOUNTER — Other Ambulatory Visit: Payer: Medicare Other

## 2020-07-06 ENCOUNTER — Telehealth: Payer: Self-pay | Admitting: Student

## 2020-07-06 ENCOUNTER — Telehealth: Payer: Self-pay

## 2020-07-06 LAB — BASIC METABOLIC PANEL
Anion gap: 11 (ref 5–15)
Anion gap: 10 (ref 5–15)
BUN: 20 mg/dL (ref 8–23)
BUN: 20 mg/dL (ref 8–23)
CO2: 28 mmol/L (ref 22–32)
CO2: 28 mmol/L (ref 22–32)
Calcium: 9.5 mg/dL (ref 8.9–10.3)
Calcium: 9.7 mg/dL (ref 8.9–10.3)
Chloride: 100 mmol/L (ref 98–111)
Chloride: 102 mmol/L (ref 98–111)
Creatinine, Ser: 1.05 mg/dL — ABNORMAL HIGH (ref 0.44–1.00)
Creatinine, Ser: 1.1 mg/dL — ABNORMAL HIGH (ref 0.44–1.00)
GFR calc Af Amer: 53 mL/min — ABNORMAL LOW (ref >60)
GFR calc Af Amer: 50 mL/min — ABNORMAL LOW (ref >60)
GFR calc non Af Amer: 46 mL/min — ABNORMAL LOW (ref >60)
GFR calc non Af Amer: 43 mL/min — ABNORMAL LOW (ref >60)
Glucose, Bld: 90 mg/dL (ref 70–99)
Glucose, Bld: 97 mg/dL (ref 70–99)
Potassium: 2.8 mmol/L — ABNORMAL LOW (ref 3.5–5.1)
Potassium: 4.4 mmol/L (ref 3.5–5.1)
Sodium: 139 mmol/L (ref 135–145)
Sodium: 140 mmol/L (ref 135–145)

## 2020-07-06 LAB — LIPID PANEL
Cholesterol: 157 mg/dL (ref 0–200)
HDL: 51 mg/dL (ref >40)
LDL Cholesterol: 91 mg/dL (ref 0–99)
Total CHOL/HDL Ratio: 3.1 RATIO
Triglycerides: 77 mg/dL (ref <150)
VLDL: 15 mg/dL (ref 0–40)

## 2020-07-06 MED ORDER — POTASSIUM CHLORIDE CRYS ER 20 MEQ PO TBCR
40.0000 meq | EXTENDED_RELEASE_TABLET | Freq: Once | ORAL | Status: AC
  Administered 2020-07-06: 40 meq via ORAL
  Filled 2020-07-06: qty 2

## 2020-07-06 MED ORDER — POTASSIUM CHLORIDE CRYS ER 20 MEQ PO TBCR: 40.0000 meq | EXTENDED_RELEASE_TABLET | Freq: Once | ORAL | Status: DC

## 2020-07-06 MED ORDER — POTASSIUM CHLORIDE 10 MEQ/100ML IV SOLN
10.0000 meq | INTRAVENOUS | Status: AC
  Administered 2020-07-06 (×3): 10 meq via INTRAVENOUS
  Filled 2020-07-06 (×4): qty 100

## 2020-07-06 MED ORDER — POTASSIUM CHLORIDE CRYS ER 20 MEQ PO TBCR
20.0000 meq | EXTENDED_RELEASE_TABLET | Freq: Once | ORAL | Status: AC
  Administered 2020-07-06: 20 meq via ORAL
  Filled 2020-07-06: qty 1

## 2020-07-06 MED ORDER — FUROSEMIDE 40 MG PO TABS
40.0000 mg | ORAL_TABLET | Freq: Every day | ORAL | Status: DC
  Administered 2020-07-07: 40 mg via ORAL
  Filled 2020-07-06: qty 1

## 2020-07-06 NOTE — Progress Notes (Addendum)
Progress Note  Patient Name: Debra Brooks Date of Encounter: 07/06/2020  [facility] Cardiologist: Truitt Merle, NP  Subjective   No acute overnight events. Shortness of breath improved. She still feels like she has some abdominal fullness but this has also improved. No chest pain or palpitations.  Inpatient Medications    Scheduled Meds: . apixaban  5 mg Oral BID  . budesonide  3 mg Oral Daily  . buPROPion  150 mg Oral Daily  . carvedilol  3.125 mg Oral BID WC  . digoxin  62.5 mcg Oral Daily  . furosemide  40 mg Intravenous Q12H  . levothyroxine  75 mcg Oral QAC breakfast  . melatonin  10 mg Oral QHS  . pregabalin  50 mg Oral Daily  . ursodiol  300 mg Oral BID   Continuous Infusions: . potassium chloride 10 mEq (07/06/20 0842)   PRN Meds: ALPRAZolam, nitroGLYCERIN, ondansetron **OR** ondansetron (ZOFRAN) IV   Vital Signs    Vitals:   07/05/20 1946 07/05/20 2358 07/06/20 0337 07/06/20 0835  BP: 139/73 117/70 139/72 124/74  Pulse: 81 80 85 89  Resp: 20 16  19   Temp: 98.3 F (36.8 C) 98.7 F (37.1 C) 98 F (36.7 C) 98.5 F (36.9 C)  TempSrc: Oral Oral Oral Oral  SpO2: 97% 91% 91% 97%  Weight:   60.3 kg   Height:        Intake/Output Summary (Last 24 hours) at 07/06/2020 0846 Last data filed at 07/06/2020 0500 Gross per 24 hour  Intake 1140 ml  Output 900 ml  Net 240 ml   Last 3 Weights 07/06/2020 07/05/2020 07/04/2020  Weight (lbs) 132 lb 14.4 oz 135 lb 9.3 oz 139 lb  Weight (kg) 60.283 kg 61.5 kg 63.05 kg      Telemetry    Atrial fibrillation with frequent PVCs (someimtes in bigeminy pattern). Short runs of non-sustained VT noted (longest being 4 beats). Baseline rates in the 70's but goes up into the 90's (suspect this is with activity). - Personally Reviewed  ECG    No new ECG tracings today. - Personally Reviewed  Physical Exam   GEN: No acute distress.   Neck: Supple. No JVD. Cardiac: Irregular rhythm with normal rate. No murmurs, rubs, or  gallops.  Respiratory: Clear to auscultation bilaterally. No wheezes, rhonchi, or rales. GI: Soft, non-tender, non-distended  MS: Chronic mild (trace to 1+) lower extremity edema. No deformity. Skin: Warm and dry. Neuro:  No focal deficits. Psych: Normal affect. Responds appropriately.  Labs    High Sensitivity Troponin:   Recent Labs  Lab 07/04/20 2243 07/05/20 0042 07/05/20 0334  TROPONINIHS 68* 93* 131*      Chemistry Recent Labs  Lab 07/04/20 2243 07/04/20 2252 07/06/20 0356  NA 141 141 139  K 4.3 4.1 2.8*  CL 102 103 100  CO2 27  --  28  GLUCOSE 132* 128* 90  BUN 22 24* 20  CREATININE 1.38* 1.30* 1.05*  CALCIUM 10.1  --  9.5  PROT 5.9*  --   --   ALBUMIN 3.5  --   --   AST 30  --   --   ALT 25  --   --   ALKPHOS 74  --   --   BILITOT 0.9  --   --   GFRNONAA 33*  --  46*  GFRAA 38*  --  53*  ANIONGAP 12  --  11     Hematology Recent Labs  Lab 07/04/20 2243 07/04/20 2252 07/05/20 0555  WBC 8.9  --  8.6  RBC 4.37  --  4.13  HGB 13.8 13.9 13.0  HCT 43.4 41.0 41.1  MCV 99.3  --  99.5  MCH 31.6  --  31.5  MCHC 31.8  --  31.6  RDW 13.4  --  13.5  PLT 208  --  212    BNP Recent Labs  Lab 07/04/20 2243  BNP 488.5*     DDimer No results for input(s): DDIMER in the last 168 hours.   Radiology    DG Chest Port 1 View  Result Date: 07/04/2020 CLINICAL DATA:  Chest pain and shortness of breath. EXAM: PORTABLE CHEST 1 VIEW COMPARISON:  Radiograph and CT 10/31/2017 FINDINGS: Cardiomegaly is unchanged from prior exam. Unchanged mediastinal contours. Hilar prominence likely secondary to enlarged pulmonary arteries. There are bilateral pleural effusions and adjacent basilar opacities, likely atelectasis. Vascular congestion. Mild pulmonary edema. No pneumothorax. Bones are diffusely under mineralized. No acute osseous abnormalities are seen. There is some external artifact overlying the lower chest likely related to breast prosthesis. IMPRESSION: Findings  consistent with CHF. Cardiomegaly with bilateral pleural effusions, vascular congestion, and mild pulmonary edema. Aortic Atherosclerosis (ICD10-I70.0). Electronically Signed   By: Keith Rake M.D.   On: 07/04/2020 22:12    Cardiac Studies   Echocardiogram 11/01/2017: Impressions: - Left ventricle: The cavity size was normal. Wall thickness was  normal. Systolic function was mildly reduced. The estimated  ejection fraction was in the range of 45% to 50%. There is  hypokinesis of the basalinferior myocardium. Features are  consistent with a pseudonormal left ventricular filling pattern,  with concomitant abnormal relaxation and increased filling  pressure (grade 2 diastolic dysfunction).  - Aortic valve: Trileaflet; mildly thickened, mildly calcified  leaflets.  - Mitral valve: There was moderate regurgitation.  - Left atrium: The atrium was moderately dilated. Volume/bsa, ES,  (1-plane Simpson&'s, A2C): 49.9 ml/m^2.  - Right atrium: The atrium was moderately dilated.  - Tricuspid valve: There was moderate regurgitation.  - Pulmonary arteries: Systolic pressure was moderately increased.  PA peak pressure: 61 mm Hg (S).  - Pericardium, extracardiac: A trivial pericardial effusion was  identified posterior to the heart.   Patient Profile     86 y.o. 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 Hal Hope.  Assessment & Plan    Acute on Chronic Diastolic CHF - Patient presented with sudden onset of shortness of  breath and abdominal fullness.  - BNP elevated in the 400's.  - Chest x-ray consistent with CHF with cardiomegaly, bilateral pleural effusion, vascular congestion, and mild pulmonary edema. - Last Echo in 2019 showed LVEF of 45-50% with grade 2 diastolic dysfunction. - Echo pending. - Started on IV Lasix 40mg  twice daily. Documented urinary output of 1.45 L yesterday but only net negative 340 mL since admission. Weight down 7 lbs since admission. Renal function stable. - Appears euvolemic on exam.  - She has already received morning dose of IV Lasix today. Will stop IV and restart PO Lasix 40mg  daily tomorrow. -  Continue Coreg as above. - Continue to monitor daily weight, strict I/O's, and renal function.  Chronic Atrial Fibrillation  History of Atrial Flutter s/p Ablation in 2007 Non-Sustained VT - Presented with atrial fibrillation with RVR. Received IV Metoprolol 5mg  with improvement in rates.  - Rate currently controlled. Frequent PVC noted on telemetry sometimes in bigeminy pattern. Short runs of NSVT also noted (longest run being 4 beats). - Potassium 4.1 yesterday but 2.8 today. Being repleted. - Magnesium 2.0 yesterday. - TSH normal. - Continue home Coreg 3.125mg  twice daily. - Continue home Digoxin 62.5 mcg. - Continue Eliquis 5mg  twice daily.  CAD - History of remote cath showing RCA disease which was treated medically. - EKG shows no acute ischemic changes.  - High-sensitivity troponin mildly elevated 68 >> 93 >> 131. - No angina. - Echo ordered. - Suspect demand ischemia in setting of acute hypoxic respiratory failure due to CHF and atrial fibrillation with RVR. Do no anticipate any ischemic evaluation.  Hypertension - BP initially elevated but now improved.  - Continue Coreg 3.125mg  twice daily.  Hyperlipidemia - History of hyperlipidemia but does not appear to be on any cholesterol medications.  - Will check lipid panel.  Hypokalemia - Potassium 2.8 today.  Down from 4.1 yesterday. - Being repleted by primary team. - Continue to monitor closely.  Ovarian Mass - CT in 07/2019 showed complex cystic ovarian mass high worrisome for cystic ovarian neoplasm. Pelvic ultrasound in 08/2019 showed 7.6 x 5.3 x 6.0 cm complex multi septate right ovarian mass again highly concerning for cystic ovarian neoplasm. Patient declined any further evaluation af the time. - Consider repeat imaging while inpatient given abdominal fullness. Will defer to primary team.  Otherwise, per primary team.  For questions or updates, please contact [place] Please consult [email or web address] for contact info under        Signed, Darreld Mclean, PA-C  07/06/2020, 8:46 AM     Agree with note by Sande Rives, PA-C  Stable today. I & Os only -300. Her serum potassium was decreased to 2.8 and is getting repletion. She did receive IV Lasix this morning we'll transition to p.o. 2D echo pending. Lungs are clear. Jugular venous pressure appears flat. Is no peripheral edema. Plan to ambulate today. Transition to p.o. Lasix. Anticipate discharge in the morning.   Lorretta Harp, M.D., [place], [facility], Debra Brooks [place] [address] [place], [place]  [address]  [phone] 07/06/2020 10:28 AM

## 2020-07-06 NOTE — Progress Notes (Signed)
Physical Therapy Treatment Patient Details Name: Debra Brooks MRN: [id number] DOB: Apr 16, 1925 Today's Date: 07/06/2020    History of Present Illness 86 yo admitted with SOB with Afib with RVR from [place] independent living facility. PMhx: CAD, CHF with EF 45%, aflutter, anxiety, breast CA, colon CA, HTN, HLD, spinal stenosis    PT Comments    Patient received in bed, very anxious about her IV hurting (per MD and RN, likely from potassium) but willing to work with therapy; still, required extra time and encouragement/reassurance from therapist today overall. Continued gait training in-room distances, and she declined out of room activities due to pain in her hand from the IV. Able to toilet but did need help with pericare. Just as we got to sitting up in the recliner, transport tech arrived and needed to take her to [place] so we returned to the bed. Left in care of transport staff, all needs otherwise met this morning.     Follow Up Recommendations  Home health PT (pending progressive gait)     Equipment Recommendations  None recommended by PT    Recommendations for Other Services       Precautions / Restrictions Precautions Precautions: Fall Restrictions Weight Bearing Restrictions: No    Mobility  Bed Mobility Overal bed mobility: Independent                Transfers Overall transfer level: Modified independent Equipment used: Rolling walker (2 wheeled)             General transfer comment: needs occasional cues for hand placement and safety due to anxiety over IV  Ambulation/Gait Ambulation/Gait assistance: Min guard Gait Distance (Feet): 25 Feet (84f, then 157f Assistive device: Rolling walker (2 wheeled) Gait Pattern/deviations: Step-through pattern;Decreased stride length Gait velocity: decreased   General Gait Details: difficulty steering RW as she kept taking her hand off to relieve pressure from the IV site; declined gait out of room due to pain  from IV   Stairs             Wheelchair Mobility    Modified Rankin (Stroke Patients Only)       Balance Overall balance assessment: Mild deficits observed, not formally tested                                          Cognition Arousal/Alertness: Awake/alert Behavior During Therapy: Anxious Overall Cognitive Status: Within Functional Limits for tasks assessed                                 General Comments: very anxious and fixated on her IV hurting today      Exercises      General Comments        Pertinent Vitals/Pain Pain Assessment: No/denies pain    Home Living                      Prior Function            PT Goals (current goals can now be found in the care plan section) Acute Rehab PT Goals Patient Stated Goal: return home and to yoga PT Goal Formulation: With patient Time For Goal Achievement: 07/19/20 Potential to Achieve Goals: Good Progress towards PT goals: Progressing toward goals    Frequency    Min  3X/week      PT Plan Current plan remains appropriate    Co-evaluation              AM-PAC PT "6 Clicks" Mobility   Outcome Measure  Help needed turning from your back to your side while in a flat bed without using bedrails?: None Help needed moving from lying on your back to sitting on the side of a flat bed without using bedrails?: None Help needed moving to and from a bed to a chair (including a wheelchair)?: None Help needed standing up from a chair using your arms (e.g., wheelchair or bedside chair)?: A Little Help needed to walk in hospital room?: A Little Help needed climbing 3-5 steps with a railing? : A Little 6 Click Score: 21    End of Session   Activity Tolerance: Patient tolerated treatment well Patient left: in bed;Other (comment) (in care of patient transport tech, ready to leave for [place]) Nurse Communication: Mobility status PT Visit Diagnosis: Other abnormalities  of gait and mobility (R26.89)     Time: 1020-1058 PT Time Calculation (min) (ACUTE ONLY): 38 min  Charges:  $Gait Training: 8-22 mins $Therapeutic Activity: 8-22 mins $Self Care/Home Management: 8-22                     Windell Norfolk, DPT, PN1   [facility]    Pager [phone] Acute Rehab Office [phone]

## 2020-07-06 NOTE — Progress Notes (Signed)
PROGRESS NOTE    Debra Brooks  VPX:[id number] DOB: 1925/01/14 DOA: 07/04/2020 PCP: Gayland Curry, DO   Chief Complaint  Patient presents with  . Shortness of Breath     Brief Narrative: As per hpi "86 y.o. female with history of chronic systolic and diastolic CHF last EF measured was 45 to 50% with grade 2 diastolic dysfunction in 4627, atrial flutter status post ablation presently in A. fib on apixaban, CAD managed medically, chronic kidney disease, hypertension, history of: Breast cancer in remission started developing sudden onset of shortness of breath last evening at the facility when patient got up to eat something.  At that time patient also had some chest pressure.  ED Course:In the ER patient was hypertensive with blood pressure more than 035 systolic chest x-ray showing bilateral pleural effusion with congestion.  EKG was showing initially A. fib rate controlled with RBBB by the time I examined the patient patient was in A. fib with RVR and had to be given IV metoprolol.  Prior to which patient also received 40 mg IV IV Lasix and since patient became acutely short of breath at the time of my exam had to be placed on BiPAP another 20 mg IV Lasix has been ordered and for the rate control metoprolol was given and heart rate improved.  Patient's labs show creatinine 1.3 BNP of 488 high sensitive troponin was 68 Covid test was negative"  Subjective:  Had rough night w/ restless leg Feeling beter Wt 139> 132.9 lb uop 1450 ml k 2.8 this am- was ordered 10 meq x 4 iv kcl before 7 am, orderd 40 meq po. Requesting pelvic ultrasound but does not want to have biopsy done  Assessment & Plan:  Acute on chronic diastolic KKX:[id number] with sudden onset of shortness of breath and abdominal fullness.  Patient diuresed with Lasix IV and clinically improving.last echo 2019 EF 45 to 50% with grade 2 diastolic dysfunction.  Echo pending.  Weight down to 132.9 pounds from 139 on admission, UOP 1450  ml.  Appreciate cardiology input, discussed this morning- plan is to switch to p.o. Lasix tomorrow replete potassium, ambulate with PT.  Continue to monitor intake and monitor renal function.  Continue Coreg   Chest pain/CAD history of remote cath with RCA disease treated medically, mildly elevated high-sensitivity troponin cardiology on board, suspect demand ischemia due to hypoxia CHF A. fib RVR  Hypokalemia: Repleting with IV and p.o. potassium will recheck labs later today. [name] to get only 3 runs as she could not tolerate more.  Will give additional p.o. and repeat  Hypertension: BP well controlled NOW.  Uncontrolled on admission.  Continue low-dose Coreg.  Chronic kidney disease (CKD), stage IIIb, baseline creatinine around 1.0-1.3 monitor renal function while on diuresis.  Chronic atrial fibrillation w/ RVR/history of a flutter status post ablation 2007/NSVT: Continue low-dose Coreg, Eliquis  Hypothyroidism: Euthyroid, TSH 0.8.  Continue her home Synthroid  Hyperlipidemia not on any meds: Lipid panel pending  Acute respiratory failure with hypoxia: Due to CHF.  Currently doing well on room air.  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  Requesting pelvic ultrasound here, was being planned for one as outpatient basis.  She still reports she will  not get biopsy.  History of mesenteric ischemia. History of breast cancer and colon cancer in remission  DVT prophylaxis:  Code Status:  Code Status: DNR  Family Communication: plan of care discussed with patient at bedside.  Updated patient's son over the phone. Status is: Inpatient Remains inpatient appropriate because:IV treatments appropriate due to intensity of illness or inability to take PO and Inpatient level of care appropriate due to severity of illness  Dispo:  Patient  From: [place]  Planned Disposition: Home with Health Care Svc  Expected discharge date: 07/07/20  Medically stable for discharge: No  Nutrition: Diet Order            Diet Heart Room service appropriate? Yes with Assist; Fluid consistency: Thin; Fluid restriction: 1200 mL Fluid  Diet effective now                 Body mass index is 21.45 kg/m.  Consultants:see note  Procedures:see note Microbiology:see note Blood Culture    Component Value Date/Time   SDES BLOOD RIGHT FOREARM 10/31/2017 0630   SPECREQUEST  10/31/2017 0630    BOTTLES DRAWN AEROBIC AND ANAEROBIC Blood Culture adequate volume   CULT NO GROWTH 5 DAYS 10/31/2017 0630   REPTSTATUS 11/05/2017 FINAL 10/31/2017 0630    Other culture-see note  Medications: Scheduled Meds: . apixaban  5 mg Oral BID  . buPROPion  150 mg Oral Daily  . carvedilol  3.125 mg Oral BID WC  . digoxin  62.5 mcg Oral Daily  . [START ON 07/07/2020] furosemide  40 mg Oral Daily  . levothyroxine  75 mcg Oral QAC breakfast  . melatonin  10 mg Oral QHS  . potassium chloride  40 mEq Oral Once  . pregabalin  50 mg Oral Daily  . ursodiol  300 mg Oral BID   Continuous Infusions: . potassium chloride 10 mEq (07/06/20 1012)    Antimicrobials: Anti-infectives (From admission, onward)   None     Objective: Vitals: Today's Vitals   07/05/20 2358 07/06/20 0337 07/06/20 0400 07/06/20 0835  BP: 117/70 139/72  124/74  Pulse: 80 85  89  Resp: 16   19  Temp: 98.7 F (37.1 C) 98 F (36.7 C)  98.5 F (36.9 C)  TempSrc: Oral Oral  Oral  SpO2: 91% 91%  97%  Weight:  60.3 kg    Height:      PainSc:   Asleep     Intake/Output Summary (Last 24 hours) at 07/06/2020 1020 Last data filed at 07/06/2020 0500 Gross per 24 hour  Intake 900 ml  Output 900 ml  Net 0 ml   Filed Weights   07/04/20 2206 07/05/20 0436 07/06/20 0337  Weight: 63 kg 61.5 kg 60.3 kg   Weight change: -2.767 kg  Intake/Output from previous day: 09/07 0701 -  09/08 0700 In: 1260 [P.O.:1260] Out: 1450 [Urine:1450] Intake/Output this shift: No intake/output data recorded.  Examination: General exam: AAOx3 ,NAD, weak appearing. HEENT:Oral mucosa moist, Ear/Nose WNL grossly,dentition normal. Respiratory system: bilaterally clear,no wheezing or crackles,no use of accessory muscle, non tender. Cardiovascular system: S1 & S2 +, regular, No JVD. Gastrointestinal system: Abdomen soft, NT,ND, BS+. Nervous System:Alert, awake, moving extremities and grossly nonfocal Extremities: No edema, distal peripheral pulses palpable.  Skin: No rashes,no icterus. MSK: Normal muscle bulk,tone, power  Data Reviewed: I have personally reviewed following labs and imaging studies CBC: Recent Labs  Lab 07/04/20 2236 07/04/20 2243 07/04/20 2252 07/05/20 0555  WBC  --  8.9  --  8.6  NEUTROABS  --  7.4  --   --   HGB 13.3 13.8 13.9 13.0  HCT 39.0  43.4 41.0 41.1  MCV  --  99.3  --  99.5  PLT  --  208  --  222   Basic Metabolic Panel: Recent Labs  Lab 07/04/20 2236 07/04/20 2243 07/04/20 2252 07/05/20 0334 07/06/20 0356  NA 139 141 141  --  139  K 4.1 4.3 4.1  --  2.8*  CL  --  102 103  --  100  CO2  --  27  --   --  28  GLUCOSE  --  132* 128*  --  90  BUN  --  22 24*  --  20  CREATININE  --  1.38* 1.30*  --  1.05*  CALCIUM  --  10.1  --   --  9.5  MG  --   --   --  2.0  --    GFR: Estimated Creatinine Clearance: 31.3 mL/min (A) (by C-G formula based on SCr of 1.05 mg/dL (H)). Liver Function Tests: Recent Labs  Lab 07/04/20 2243  AST 30  ALT 25  ALKPHOS 74  BILITOT 0.9  PROT 5.9*  ALBUMIN 3.5   No results for input(s): LIPASE, AMYLASE in the last 168 hours. No results for input(s): AMMONIA in the last 168 hours. Coagulation Profile: No results for input(s): INR, PROTIME in the last 168 hours. Cardiac Enzymes: No results for input(s): CKTOTAL, CKMB, CKMBINDEX, TROPONINI in the last 168 hours. BNP (last 3 results) No results for  input(s): PROBNP in the last 8760 hours. HbA1C: No results for input(s): HGBA1C in the last 72 hours. CBG: No results for input(s): GLUCAP in the last 168 hours. Lipid Profile: No results for input(s): CHOL, HDL, LDLCALC, TRIG, CHOLHDL, LDLDIRECT in the last 72 hours. Thyroid Function Tests: Recent Labs    07/05/20 0555  TSH 0.802   Anemia Panel: No results for input(s): VITAMINB12, FOLATE, FERRITIN, TIBC, IRON, RETICCTPCT in the last 72 hours. Sepsis Labs: No results for input(s): PROCALCITON, LATICACIDVEN in the last 168 hours.  Recent Results (from the past 240 hour(s))  SARS Coronavirus 2 by RT PCR (hospital order, performed in [facility] hospital lab) Nasopharyngeal Nasopharyngeal Swab     Status: None   Collection Time: 07/05/20  1:36 AM   Specimen: Nasopharyngeal Swab  Result Value Ref Range Status   SARS Coronavirus 2 NEGATIVE NEGATIVE Final    Comment: (NOTE) SARS-CoV-2 target nucleic acids are NOT DETECTED.  The SARS-CoV-2 RNA is generally detectable in upper and lower respiratory specimens during the acute phase of infection. The lowest concentration of SARS-CoV-2 viral copies this assay can detect is 250 copies / mL. A negative result does not preclude SARS-CoV-2 infection and should not be used as the sole basis for treatment or other patient management decisions.  A negative result may occur with improper specimen collection / handling, submission of specimen other than nasopharyngeal swab, presence of viral mutation(s) within the areas targeted by this assay, and inadequate number of viral copies (<250 copies / mL). A negative result must be combined with clinical observations, patient history, and epidemiological information.  Fact Sheet for Patients:   StrictlyIdeas.no  Fact Sheet for Healthcare Providers: BankingDealers.co.za  This test is not yet approved or  cleared by the [place] FDA and has been  authorized for detection and/or diagnosis of SARS-CoV-2 by FDA under an Emergency Use Authorization (EUA).  This EUA will remain in effect (meaning this test can be used) for the duration of the COVID-19 declaration under Section 564(b)(1) of the Act,  21 U.S.C. section 360bbb-3(b)(1), unless the authorization is terminated or revoked sooner.  Performed at [facility], [place] [address], [place], [place] [address]   MRSA PCR Screening     Status: None   Collection Time: 07/05/20  9:21 AM   Specimen: Nasal Mucosa; Nasopharyngeal  Result Value Ref Range Status   MRSA by PCR NEGATIVE NEGATIVE Final    Comment:        The GeneXpert MRSA Assay (FDA approved for NASAL specimens only), is one component of a comprehensive MRSA colonization surveillance program. It is not intended to diagnose MRSA infection nor to guide or monitor treatment for MRSA infections. Performed at [facility], [place] [address] [place], [place] [address]      Radiology Studies: DG Chest Port 1 View  Result Date: 07/04/2020 CLINICAL DATA:  Chest pain and shortness of breath. EXAM: PORTABLE CHEST 1 VIEW COMPARISON:  Radiograph and CT 10/31/2017 FINDINGS: Cardiomegaly is unchanged from prior exam. Unchanged mediastinal contours. Hilar prominence likely secondary to enlarged pulmonary arteries. There are bilateral pleural effusions and adjacent basilar opacities, likely atelectasis. Vascular congestion. Mild pulmonary edema. No pneumothorax. Bones are diffusely under mineralized. No acute osseous abnormalities are seen. There is some external artifact overlying the lower chest likely related to breast prosthesis. IMPRESSION: Findings consistent with CHF. Cardiomegaly with bilateral pleural effusions, vascular congestion, and mild pulmonary edema. Aortic Atherosclerosis (ICD10-I70.0). Electronically Signed   By: Keith Rake M.D.   On: 07/04/2020 22:12     LOS: 2 days   Antonieta Pert, MD [facility]  07/06/2020, 10:20 AM

## 2020-07-06 NOTE — Telephone Encounter (Signed)
Debra Brooks called and stated the plan is for her to be discharged tomorrow, though she still has to have an EKG. She stated her potassium was low (2.8 at 03:56 today) and she wants someone to monitor it. She stated she thought she might want to go to rehab for a couple of days after discharge.  She also stated she had a vaginal ultrasound in the hospital, stated you were wanting her to have this.   I didn't know if [facility] would contact [facility] for disposition on discharge. Patient was concerned there would not be a bed available in rehab.  Of course I have no idea of the process there. I told her I would send you a message and let her know what the process is likely to be once she is discharged.   I do see that she has had a PT evaluation and 1 PT encounter documented thus far.

## 2020-07-06 NOTE — Telephone Encounter (Signed)
Called and spoke with patient's son. I have also tried to call Echo tech for update.

## 2020-07-06 NOTE — Progress Notes (Signed)
Mobility Specialist - Progress Note   07/06/20 1457  Mobility  Activity Ambulated in hall  Level of Assistance Standby assist, set-up cues, supervision of patient - no hands on  Assistive Device Front wheel walker  Distance Ambulated (ft) 250 ft (Intervals: 125 ft x 2)  Mobility Response Tolerated well  Mobility performed by Mobility specialist  $Mobility charge 1 Mobility    Pre-mobility: 105 HR During mobility: 122 HR, 97% SpO2 Post-mobility: 92 HR  Pt says that she felt much better ambulating w/ the IV removed. She needed one standing rest break due to feeling out of breath, her SpO2 was 97% at that time.   Pricilla Handler Mobility Specialist Mobility Specialist Phone: [phone]

## 2020-07-06 NOTE — Telephone Encounter (Signed)
Patient's son states the patient saw Dr. Gwenlyn Found in the hospital and he was going to discharge her but wanted her to have an echo. He states she has not had it done yet and would like someone to check on getting that done. He states he spoke with Callie at the hospital and would like her to call him back.

## 2020-07-06 NOTE — Telephone Encounter (Signed)
I'm sure social services at the hospital has been in touch with [facility] to make arrangements.  I sent a message to the [place] team about her desire for a rehab stay.  Please let her know.  We will monitor all necessary labs when she returns.

## 2020-07-07 ENCOUNTER — Inpatient Hospital Stay (HOSPITAL_COMMUNITY): Payer: Medicare Other

## 2020-07-07 DIAGNOSIS — I5043 Acute on chronic combined systolic (congestive) and diastolic (congestive) heart failure: Secondary | ICD-10-CM

## 2020-07-07 LAB — BASIC METABOLIC PANEL
Anion gap: 7 (ref 5–15)
BUN: 20 mg/dL (ref 8–23)
CO2: 28 mmol/L (ref 22–32)
Calcium: 9.5 mg/dL (ref 8.9–10.3)
Chloride: 105 mmol/L (ref 98–111)
Creatinine, Ser: 1.25 mg/dL — ABNORMAL HIGH (ref 0.44–1.00)
GFR calc Af Amer: 43 mL/min — ABNORMAL LOW (ref >60)
GFR calc non Af Amer: 37 mL/min — ABNORMAL LOW (ref >60)
Glucose, Bld: 81 mg/dL (ref 70–99)
Potassium: 4 mmol/L (ref 3.5–5.1)
Sodium: 140 mmol/L (ref 135–145)

## 2020-07-07 LAB — ECHOCARDIOGRAM COMPLETE
Area-P 1/2: 5.27 cm2
Height: 66 in
S' Lateral: 3.4 cm
Single Plane A4C EF: 39.3 %
Weight: 2127 oz

## 2020-07-07 LAB — GLUCOSE, CAPILLARY: Glucose-Capillary: 112 mg/dL — ABNORMAL HIGH (ref 70–99)

## 2020-07-07 LAB — MAGNESIUM: Magnesium: 1.8 mg/dL (ref 1.7–2.4)

## 2020-07-07 MED ORDER — CARVEDILOL 6.25 MG PO TABS: 6.2500 mg | ORAL_TABLET | Freq: Two times a day (BID) | ORAL | 0 refills | Status: DC

## 2020-07-07 MED ORDER — FUROSEMIDE 40 MG PO TABS: 40.0000 mg | ORAL_TABLET | Freq: Every day | ORAL | 0 refills | Status: DC

## 2020-07-07 MED ORDER — CARVEDILOL 6.25 MG PO TABS: 6.2500 mg | ORAL_TABLET | Freq: Two times a day (BID) | ORAL | Status: DC

## 2020-07-07 NOTE — Telephone Encounter (Signed)
Patient called to check the status of her message for she had not heard back. I informed patient of Dr.Reed's response and she was pleased with the answer.

## 2020-07-07 NOTE — Progress Notes (Signed)
Order received to discharge patient.  Telemetry monitor removed and CCMD notified.  PIV access removed.  Discharge instructions, follow up, medications and instructions for their use discussed with patient. 

## 2020-07-07 NOTE — TOC Transition Note (Signed)
Transition of Care [facility]) - CM/SW Discharge Note   Patient Details  Name: Debra Brooks MRN: [id number] Date of Birth: 1925-01-24  [facility]) CM/SW Contact:  Hyman Hopes, RN Phone Number: 07/07/2020, 1:42 PM   Clinical Narrative:    Case manager noted discharge orders-went to speak with patient about options for home health physical therapy.  Patient noted to have residence in [place] and said that she could do rehab physical therapy there.  Patient also noted that she does have an apartment there but wanted to choose the skilled nursing facility for a few days to get stronger since she lives alone.  Patient has already called [facility] and gotten a room on the SNF side.  Social work is working to assist with transition to [employer].    Final next level of care: Skilled Nursing Facility Barriers to Discharge: No Barriers Identified   Patient Goals and CMS Choice Patient states their goals for this hospitalization and ongoing recovery are:: to go to SNF CMS Medicare.gov Compare Post Acute Care list provided to:: Patient Choice offered to / list presented to : Patient  Discharge Placement              Patient chooses bed at: [facility] Patient to be transferred to facility by: [place] Name of family member notified: Shanon Brow Patient and family notified of of transfer: 07/07/20  Discharge Plan and Services In-house Referral: NA Discharge Planning Services: CM Consult                                 Social Determinants of Health (SDOH) Interventions     Readmission Risk Interventions No flowsheet data found.

## 2020-07-07 NOTE — TOC Transition Note (Signed)
Transition of Care [facility]) - CM/SW Discharge Note   Patient Details  Name: ANABELEN KAMINSKY MRN: [id number] Date of Birth: 11/23/1924  Transition of Care [facility]) CM/SW Contact:  Trula Ore, [place] Phone Number: 07/07/2020, 1:59 PM   Clinical Narrative:      Patient will DC to: [facility]   Anticipated DC date: 07/07/2020  Family notified: Shanon Brow   Transport by: [facility] Transportation  ?  Per MD patient ready for DC to [place]. RN, patient, patient's family, and facility notified of DC. Discharge Summary sent to facility. RN given number for report tele#[phone] RM#[phone]. DC packet on chart. Ambulance transport requested for patient.  CSW signing off. Final next level of care: Skilled Nursing Facility Barriers to Discharge: No Barriers Identified   Patient Goals and CMS Choice Patient states their goals for this hospitalization and ongoing recovery are:: to go to SNF CMS Medicare.gov Compare Post Acute Care list provided to:: Patient Choice offered to / list presented to : Patient  Discharge Placement              Patient chooses bed at: [facility] Patient to be transferred to facility by: [place] Name of family member notified: Shanon Brow Patient and family notified of of transfer: 07/07/20  Discharge Plan and Services In-house Referral: NA Discharge Planning Services: CM Consult                                 Social Determinants of Health (SDOH) Interventions     Readmission Risk Interventions No flowsheet data found.

## 2020-07-07 NOTE — Progress Notes (Signed)
Mobility Specialist: Progress Note    07/07/20 1434  Mobility  Activity Ambulated in hall  Level of Assistance Independent  Assistive Device Four wheel walker  Distance Ambulated (ft) 590 ft  Mobility Response Tolerated well  Mobility performed by Mobility specialist  Bed Position High-fowlers  $Mobility charge 1 Mobility   Pt stopped to take one rest break, leaning up against the wall halfway through ambulation. Pt said she felt fine throughout walk.   [facility] Debra Brooks Mobility Specialist

## 2020-07-07 NOTE — Discharge Summary (Signed)
Physician Discharge Summary  Debra Brooks WRU:[id number] DOB: September 07, 1925 DOA: 07/04/2020  PCP: Gayland Curry, DO  Admit date: 07/04/2020 Discharge date: 07/07/2020  Admitted From: home Disposition:  SNF  Recommendations for Outpatient Follow-up:  1. Follow up with PCP in 1-2 weeks 2. Please obtain BMP/CBC in one week 3. Please follow up on the following pending results:  Home Health:Yes  Equipment/Devices: None  Discharge Condition: Stable Code Status:   Code Status: DNR Diet recommendation:  Diet Order            Diet - low sodium heart healthy           Diet Heart Room service appropriate? Yes with Assist; Fluid consistency: Thin; Fluid restriction: 1200 mL Fluid  Diet effective now                  Brief/Interim Summary: 86 y.o.femalewithhistory of chronic systolic and diastolic CHF last EF measured was 45 to 50% with grade 2 diastolic dysfunction in 9147, atrial flutter status post ablation presently in A. fib on apixaban, CAD managed medically, chronic kidney disease, hypertension, history of: Breast cancer in remission started developing sudden onset of shortness of breath last evening at the facility when patient got up to eat something. At that time patient also had some chest pressure.  ED Course:In the ER patient was hypertensive with blood pressure more than 829 systolic chest x-ray showing bilateral pleural effusion with congestion. EKG was showing initially A. fib rate controlled with RBBB by the time I examined the patient patient was in A. fib with RVR and had to be given IV metoprolol. Prior to which patient also received 40 mg IV IV Lasix and since patient became acutely short of breath at the time of my exam had to be placed on BiPAP another 20 mg IV Lasix has been ordered and for the rate control metoprolol was given and heart rate improved. Patient's labs show creatinine 1.3 BNP of 488 high sensitive troponin was 68 Covid test was negative" patient was  admitted treated for CHF and diuresed, with improvement in the swelling, seen by cardiology and at this time patient euvolemic and okay for discharge to skilled nursing facility on oral Lasix.  Discharge Diagnoses:  Acute on chronic combined systolic and diastolic FAO:[id number] with sudden onset of shortness of breath and abdominal fullness.  Patient diuresed with Lasix IV and clinically improved.last echo 2019 EF 45 to 50% with grade 2 diastolic dysfunction.  Echo reviewed showed 35 to 40% EF, grade 2 diastolic dysfunction, recommend avoiding stressors in 1 motion allergies. At this time euvolemic now on oral Lasix as per cardiology.  She is on coreg 6.25 mg bid.She will be discharged with instruction follow-up daily weight monitoring fluid restriction salt restriction and outpatient follow-up with cardiology.   Chest pain/CAD history of remote cath with RCA disease treated medically, mildly elevated high-sensitivity troponin cardiology on board, suspect demand ischemia due to hypoxia CHF A. fib RVR  Hypokalemia: Repleted. Cont po kcl at snf  Hypertension: BP well controlled continue on increased dose of Coreg.  Chronic kidney disease (CKD), stage IIIb, baseline creatinine around 1.0-1.3 monitor renal function while on diuresis. Creatinine at  1.2. Repeat BMP in a week Recent Labs  Lab 07/04/20 2243 07/04/20 2252 07/06/20 0356 07/06/20 1611 07/07/20 0428  BUN 22 24* 20 20 20   CREATININE 1.38* 1.30* 1.05* 1.10* 1.25*    Chronic atrial fibrillation w/ RVR/history of a flutter status post ablation 2007/NSVT: Continue  Coreg,  Eliquis. Follow-up with cardiology outpatient.  Hypothyroidism: Euthyroid, TSH 0.8.  Continue her home Synthroid  Hyperlipidemia not on any meds: LDL stable at 91.  Acute respiratory failure with hypoxia: Due to CHF.   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  Requesting pelvic ultrasound here and shows "Complex cystic mass of the RIGHT ovary with internal septations is not significantly changed from ultrasound 09/03/2019. 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 2. Normal uterus and endometrium. 3. LEFT ovary not identified, was being planned for one as outpatient basis.  She still reports she will  not get biopsy.  History of mesenteric ischemia. History of breast cancer and colon cancer in remission  Consults:  Cardiology  Subjective: Resting comfortably.  No nausea vomiting shortness breath fever chills.  Ready for discharge today.  Discharge Exam: Vitals:   07/07/20 0940 07/07/20 1027  BP:  137/72  Pulse: 93 82  Resp:  18  Temp:  97.8 F (36.6 C)  SpO2:  94%   General: Pt is alert, awake, not in acute distress Cardiovascular: RRR, S1/S2 +, no rubs, no gallops Respiratory: CTA bilaterally, no wheezing, no rhonchi Abdominal: Soft, NT, ND, bowel sounds + Extremities: no edema, no cyanosis  Discharge Instructions  Discharge Instructions    Diet - low sodium heart healthy   Complete by: As directed    Discharge instructions   Complete by: As directed    Please call call MD or return to ER for similar or worsening recurring problem that brought you to hospital or if any fever,nausea/vomiting,abdominal pain, uncontrolled pain, chest pain,  shortness of breath or any other alarming symptoms.  Please follow-up your doctor as instructed in a week time and call the office for appointment.  Please avoid alcohol, smoking, or any other illicit substance and maintain healthy habits including taking your regular medications as prescribed.  You were cared for by a hospitalist during your hospital stay. If you have any questions about your discharge medications or the care you received while you were in the hospital after you are discharged, you can  call the unit and ask to speak with the hospitalist on call if the hospitalist that took care of you is not available.  Once you are discharged, your primary care physician will handle any further medical issues. Please note that NO REFILLS for any discharge medications will be authorized once you are discharged, as it is imperative that you return to your primary care physician (or establish a relationship with a primary care physician if you do not have one) for your aftercare needs so that they can reassess your need for medications and monitor your lab values    Check weight daily. Restrict fluid intake to 1200 ml daily and salt intake to <2 g mdaily. Call MD:  Anytime you have any of the following symptoms: 1) 3 pound weight gain in 24 hours or 5 pounds in 1 week 2) shortness of breath, with or without a dry hacking cough 3) swelling in the hands, feet or stomach 4) if you have to sleep on extra pillows at night in order to breathe   Increase activity slowly   Complete by: As directed      Allergies as of 07/07/2020      Reactions   Avelox [moxifloxacin Hcl In Nacl] Swelling   Tongue swelling  Moxifloxacin Anaphylaxis   Tongue thickness and dysarthria   Prolia [denosumab] Other (See Comments)   Arthralgias   Iodinated Diagnostic Agents    Unknown allergy' but it was documented that she did fine and had no problems with the 13 hour prep for CT consisting of prednisone and benadryl before imaging.  Today on 11/06/14, she did the 1 hour emergent prep of prednisone and benadryl 1 hour before testing and after imaging, she had no problems with the IV dye   Iodine Hives   Levaquin [levofloxacin] Nausea Only   Pacerone [amiodarone]    unknown   Valium [diazepam]    unknown      Medication List    TAKE these medications   acetaminophen 325 MG tablet Commonly known as: TYLENOL Take 650 mg by mouth every 6 (six) hours as needed for mild pain or fever.   ALPRAZolam 0.25 MG  tablet Commonly known as: XANAX TAKE 1 TABLET AT BEDTIME AS NEEDED FOR ANXIETY. What changed: See the new instructions.   budesonide 3 MG 24 hr capsule Commonly known as: ENTOCORT EC Take 3 mg by mouth daily.   buPROPion 150 MG 24 hr tablet Commonly known as: WELLBUTRIN XL TAKE (1) TABLET DAILY IN THE MORNING. What changed: See the new instructions.   calcium-vitamin D 500-200 MG-UNIT tablet Commonly known as: OSCAL WITH D Take 1 tablet by mouth daily with breakfast.   carvedilol 6.25 MG tablet Commonly known as: COREG Take 1 tablet (6.25 mg total) by mouth 2 (two) times daily with a meal. What changed:   medication strength  See the new instructions.   digoxin 0.125 MG tablet Commonly known as: LANOXIN TAKE 1/2 TABLET DAILY.   Eliquis 5 MG Tabs tablet Generic drug: apixaban TAKE 1 TABLET BY MOUTH TWICE DAILY. What changed: how much to take   furosemide 40 MG tablet Commonly known as: LASIX Take 1 tablet (40 mg total) by mouth daily. What changed: See the new instructions.   iron polysaccharides 150 MG capsule Commonly known as: NIFEREX Take 150 mg by mouth daily.   levalbuterol 0.63 MG/3ML nebulizer solution Commonly known as: XOPENEX Take 0.63 mg by nebulization 2 (two) times daily as needed for wheezing or shortness of breath.   Melatonin 10 MG Caps Take 1 tablet by mouth at bedtime.   mometasone 50 MCG/ACT nasal spray Commonly known as: NASONEX Place 2 sprays into the nose as needed (stuffy nose).   nitroGLYCERIN 0.4 MG SL tablet Commonly known as: NITROSTAT TAKE 1 TABLET UNDER TONGUE EVERY 5 MINUTES AS NEEDED FOR CHEST PAIN. What changed: See the new instructions.   pantoprazole 40 MG tablet Commonly known as: PROTONIX Take 40 mg by mouth daily.   polyethylene glycol 17 g packet Commonly known as: MIRALAX / GLYCOLAX Take 17 g by mouth daily as needed for mild constipation.   potassium chloride SA 20 MEQ tablet Commonly known as:  KLOR-CON TAKE 1 TABLET DAILY. AND TAKE AS NEEDED IF SECOND FUROSEMIDE TABLET IS NEEDED. What changed:   how much to take  how to take this  when to take this   pregabalin 50 MG capsule Commonly known as: LYRICA Take one capsule by mouth once daily. What changed:   how much to take  how to take this  when to take this  additional instructions   PRESERVISION AREDS PO Take 1 tablet by mouth 2 (two) times daily.   Synthroid 75 MCG tablet Generic drug: levothyroxine TAKE 1 TABLET ONCE DAILY. What  changed:   how much to take  when to take this   ursodiol 500 MG tablet Commonly known as: ACTIGALL Take 250 mg by mouth 2 (two) times daily.   Vitamin C 500 MG Chew Chew 500 mg by mouth daily. Take 1 tablet by mouth daily ( chewable )   Vitamin D3 125 MCG (5000 UT) Tabs Take 1 tablet by mouth in the morning and at bedtime.       Follow-up Information    Burtis Junes, NP Follow up.   Specialties: Nurse Practitioner, Interventional Cardiology, Cardiology, Radiology Why: Hospital follow-up visit scheduled for 07/26/2020 at 1:45pm. Please arrive 15 minutes early for check-in. If this date/time does not work for you, please call our office to reschedule. Contact information: [address] [place] [address] [phone]        Hollace Kinnier L, DO Follow up in 1 week(s).   Specialty: Geriatric Medicine Contact information: [address] [place] [address] [phone]              Allergies  Allergen Reactions  . Avelox [Moxifloxacin Hcl In Nacl] Swelling    Tongue swelling  . Moxifloxacin Anaphylaxis    Tongue thickness and dysarthria  . Prolia [Denosumab] Other (See Comments)    Arthralgias  . Iodinated Diagnostic Agents     Unknown allergy' but it was documented that she did fine and had no problems with the 13 hour prep for CT consisting of prednisone and benadryl before imaging.  Today on 11/06/14, she did the 1 hour emergent  prep of prednisone and benadryl 1 hour before testing and after imaging, she had no problems with the IV dye  . Iodine Hives  . Levaquin [Levofloxacin] Nausea Only  . Pacerone [Amiodarone]     unknown  . Valium [Diazepam]     unknown    The results of significant diagnostics from this hospitalization (including imaging, microbiology, ancillary and laboratory) are listed below for reference.    Microbiology: Recent Results (from the past 240 hour(s))  SARS Coronavirus 2 by RT PCR (hospital order, performed in [facility] hospital lab) Nasopharyngeal Nasopharyngeal Swab     Status: None   Collection Time: 07/05/20  1:36 AM   Specimen: Nasopharyngeal Swab  Result Value Ref Range Status   SARS Coronavirus 2 NEGATIVE NEGATIVE Final    Comment: (NOTE) SARS-CoV-2 target nucleic acids are NOT DETECTED.  The SARS-CoV-2 RNA is generally detectable in upper and lower respiratory specimens during the acute phase of infection. The lowest concentration of SARS-CoV-2 viral copies this assay can detect is 250 copies / mL. A negative result does not preclude SARS-CoV-2 infection and should not be used as the sole basis for treatment or other patient management decisions.  A negative result may occur with improper specimen collection / handling, submission of specimen other than nasopharyngeal swab, presence of viral mutation(s) within the areas targeted by this assay, and inadequate number of viral copies (<250 copies / mL). A negative result must be combined with clinical observations, patient history, and epidemiological information.  Fact Sheet for Patients:   StrictlyIdeas.no  Fact Sheet for Healthcare Providers: BankingDealers.co.za  This test is not yet approved or  cleared by the [place] FDA and has been authorized for detection and/or diagnosis of SARS-CoV-2 by FDA under an Emergency Use Authorization (EUA).  This EUA will remain in  effect (meaning this test can be used) for the duration of the COVID-19 declaration under Section  564(b)(1) of the Act, 21 U.S.C. section 360bbb-3(b)(1), unless the authorization is terminated or revoked sooner.  Performed at [facility], [place] [address], [place], [place] [address]   MRSA PCR Screening     Status: None   Collection Time: 07/05/20  9:21 AM   Specimen: Nasal Mucosa; Nasopharyngeal  Result Value Ref Range Status   MRSA by PCR NEGATIVE NEGATIVE Final    Comment:        The GeneXpert MRSA Assay (FDA approved for NASAL specimens only), is one component of a comprehensive MRSA colonization surveillance program. It is not intended to diagnose MRSA infection nor to guide or monitor treatment for MRSA infections. Performed at [facility], [place] [address], [place], [place] [address]     Procedures/Studies: DG Chest Port 1 View  Result Date: 07/04/2020 CLINICAL DATA:  Chest pain and shortness of breath. EXAM: PORTABLE CHEST 1 VIEW COMPARISON:  Radiograph and CT 10/31/2017 FINDINGS: Cardiomegaly is unchanged from prior exam. Unchanged mediastinal contours. Hilar prominence likely secondary to enlarged pulmonary arteries. There are bilateral pleural effusions and adjacent basilar opacities, likely atelectasis. Vascular congestion. Mild pulmonary edema. No pneumothorax. Bones are diffusely under mineralized. No acute osseous abnormalities are seen. There is some external artifact overlying the lower chest likely related to breast prosthesis. IMPRESSION: Findings consistent with CHF. Cardiomegaly with bilateral pleural effusions, vascular congestion, and mild pulmonary edema. Aortic Atherosclerosis (ICD10-I70.0). Electronically Signed   By: Keith Rake M.D.   On: 07/04/2020 22:12   ECHOCARDIOGRAM COMPLETE  Result Date: 07/07/2020    ECHOCARDIOGRAM REPORT   Patient Name:   Debra Brooks Date of Exam: 07/07/2020 Medical Rec #:  [id number]      Height:       66.0 in  Accession #:    [id number]     Weight:       132.9 lb Date of Birth:  [birth year]       BSA:          1.681 m Patient Age:    55 years       BP:           149/83 mmHg Patient Gender: F              HR:           79 bpm. Exam Location:  Inpatient Procedure: 2D Echo, Cardiac Doppler and Color Doppler Indications:    CHF  History:        Patient has prior history of Echocardiogram examinations, most                 recent 11/01/2017. CHF, CAD, Carotid Disease, Arrythmias:Atrial                 Flutter and RBBB, Signs/Symptoms:Shortness of Breath; Risk                 Factors:Hypertension and Dyslipidemia.  Sonographer:    Dustin Flock Referring Phys: [id number] [place]  1. Left ventricular ejection fraction, by estimation, is 35 to 40%. The left ventricle has moderately decreased function. The left ventricle demonstrates regional wall motion abnormalities (see scoring diagram/findings for description). There is moderate concentric left ventricular hypertrophy. Left ventricular diastolic parameters are consistent with Grade III diastolic dysfunction (restrictive). There is severe hypokinesis of the left ventricular, basal inferior wall.  2. Right ventricular systolic function is normal. The right ventricular size is normal. There is severely elevated pulmonary artery systolic pressure.  3.  Left atrial size was moderately dilated.  4. Right atrial size was severely dilated.  5. 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  6. Tricuspid valve regurgitation is moderate to severe.  7. The aortic valve is tricuspid. There is moderate calcification of the aortic valve. There is mild thickening of the aortic valve. Aortic valve regurgitation is not visualized. Mild to moderate aortic valve sclerosis/calcification is present, without any evidence of aortic stenosis.  8. The inferior vena cava is dilated in size with <50% respiratory variability, suggesting right atrial pressure  of 15 mmHg. Conclusion(s)/Recommendation(s): EF appears reduced compared to prior. There is mild-moderate global hypokinesis as well as previously noted basal inferior wall motion abnormalitiy. Eccentric MR, at least moderate and possibly severe. Restrictive filling, elevated PA pressures, dilated IVC. FINDINGS  Left Ventricle: Left ventricular ejection fraction, by estimation, is 35 to 40%. The left ventricle has moderately decreased function. The left ventricle demonstrates regional wall motion abnormalities. Severe hypokinesis of the left ventricular, basal inferior wall. The left ventricular internal cavity size was normal in size. There is moderate concentric left ventricular hypertrophy. Left ventricular diastolic parameters are consistent with Grade III diastolic dysfunction (restrictive). Right Ventricle: The right ventricular size is normal. No increase in right ventricular wall thickness. Right ventricular systolic function is normal. There is severely elevated pulmonary artery systolic pressure. The tricuspid regurgitant velocity is 3.68 m/s, and with an assumed right atrial pressure of 15 mmHg, the estimated right ventricular systolic pressure is 86.5 mmHg. Left Atrium: Left atrial size was moderately dilated. Right Atrium: Right atrial size was severely dilated. Pericardium: Trivial pericardial effusion is present. Mitral Valve: At least moderate eccentric posterior directed MR. 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 Tricuspid Valve: The tricuspid valve is normal in structure. Tricuspid valve regurgitation is moderate to severe. Aortic Valve: The aortic valve is tricuspid. There is moderate calcification of the aortic valve. There is mild thickening of the aortic valve. There is moderate aortic valve annular calcification. Aortic valve regurgitation is not visualized. Mild to moderate aortic valve sclerosis/calcification is present, without any evidence of aortic  stenosis. Pulmonic Valve: The pulmonic valve was grossly normal. Pulmonic valve regurgitation is mild. No evidence of pulmonic stenosis. Aorta: The aortic root and ascending aorta are structurally normal, with no evidence of dilitation. Venous: The inferior vena cava is dilated in size with less than 50% respiratory variability, suggesting right atrial pressure of 15 mmHg. IAS/Shunts: The atrial septum is grossly normal. Additional Comments: There is a small pleural effusion in both left and right lateral regions.  LEFT VENTRICLE PLAX 2D LVIDd:         4.20 cm     Diastology LVIDs:         3.40 cm     LV e' medial:    3.68 cm/s LV PW:         1.30 cm     LV E/e' medial:  23.3 LV IVS:        1.30 cm     LV e' lateral:   7.80 cm/s LVOT diam:     2.40 cm     LV E/e' lateral: 11.0 LV SV:         56 LV SV Index:   33 LVOT Area:     4.52 cm  LV Volumes (MOD) LV vol d, MOD A4C: 86.0 ml LV vol s, MOD A4C: 52.2 ml LV SV MOD A4C:  86.0 ml RIGHT VENTRICLE RV Basal diam:  3.20 cm RV S prime:     8.59 cm/s TAPSE (M-mode): 2.1 cm LEFT ATRIUM             Index       RIGHT ATRIUM           Index LA diam:        3.70 cm 2.20 cm/m  RA Area:     21.00 cm LA Vol (A2C):   56.7 ml 33.73 ml/m RA Volume:   65.40 ml  38.90 ml/m LA Vol (A4C):   45.6 ml 27.12 ml/m LA Biplane Vol: 53.6 ml 31.88 ml/m  AORTIC VALVE LVOT Vmax:   63.30 cm/s LVOT Vmean:  39.300 cm/s LVOT VTI:    0.123 m  AORTA Ao Root diam: 2.90 cm MITRAL VALVE               TRICUSPID VALVE MV Area (PHT): 5.27 cm    TR Peak grad:   54.2 mmHg MV Decel Time: 144 msec    TR Vmax:        368.00 cm/s MV E velocity: 85.90 cm/s MV A velocity: 26.30 cm/s  SHUNTS MV E/A ratio:  3.27        Systemic VTI:  0.12 m                            Systemic Diam: 2.40 cm Buford Dresser MD Electronically signed by Buford Dresser MD Signature Date/Time: 07/07/2020/9:59:00 AM    Final    US PELVIC COMPLETE WITH TRANSVAGINAL  Result Date: 07/06/2020 CLINICAL DATA:  Ovarian mass  EXAM: TRANSABDOMINAL AND TRANSVAGINAL ULTRASOUND OF PELVIS TECHNIQUE: Both transabdominal and transvaginal ultrasound examinations of the pelvis were performed. Transabdominal technique was performed for global imaging of the pelvis including uterus, ovaries, adnexal regions, and pelvic cul-de-sac. It was necessary to proceed with endovaginal exam following the transabdominal exam to visualize the . COMPARISON:  Ultrasound 09/03/2019 FINDINGS: Uterus Measurements: 4.5 x 2.2 x 3.4 cm = volume: 17.5 mL. Heterogeneous myometrium no focal lesion Endometrium Thickness: 3 mm.  No focal abnormality visualized. Right ovary Complex cystic mass of the RIGHT ovary is again demonstrated. The mass is predominantly anechoic with mildly thickened septation. RIGHT ovarian mass measures 6.9 x 4.5 x 5.9 cm compared to 7.6 x 5.3 of 6.3 on comparison ultrasound 09/03/2019. No significant internal vascularity. Visually the lesion appears very similar to comparison ultrasound. Left ovary Measurements: Not identified Other findings No free fluid IMPRESSION: 1. Complex cystic mass of the RIGHT ovary with internal septations is not significantly changed from ultrasound 09/03/2019. 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 2. Normal uterus and endometrium. 3. LEFT ovary not identified. Electronically Signed   By: Suzy Bouchard M.D.   On: 07/06/2020 13:33    Labs: BNP (last 3 results) Recent Labs    07/04/20 2243  BNP 505.3*   Basic Metabolic Panel: Recent Labs  Lab 07/04/20 2243 07/04/20 2252 07/05/20 0334 07/06/20 0356 07/06/20 1611 07/07/20 0428  NA 141 141  --  139 140 140  K 4.3 4.1  --  2.8* 4.4 4.0  CL 102 103  --  100 102 105  CO2 27  --   --  28 28 28   GLUCOSE 132* 128*  --  90 97 81  BUN 22 24*  --  20 20 20   CREATININE 1.38* 1.30*  --  1.05* 1.10* 1.25*  CALCIUM  10.1  --   --  9.5 9.7 9.5  MG  --   --  2.0  --   --  1.8   Liver Function Tests: Recent Labs  Lab 07/04/20 2243  AST 30   ALT 25  ALKPHOS 74  BILITOT 0.9  PROT 5.9*  ALBUMIN 3.5   No results for input(s): LIPASE, AMYLASE in the last 168 hours. No results for input(s): AMMONIA in the last 168 hours. CBC: Recent Labs  Lab 07/04/20 2236 07/04/20 2243 07/04/20 2252 07/05/20 0555  WBC  --  8.9  --  8.6  NEUTROABS  --  7.4  --   --   HGB 13.3 13.8 13.9 13.0  HCT 39.0 43.4 41.0 41.1  MCV  --  99.3  --  99.5  PLT  --  208  --  212   Cardiac Enzymes: No results for input(s): CKTOTAL, CKMB, CKMBINDEX, TROPONINI in the last 168 hours. BNP: Invalid input(s): POCBNP CBG: Recent Labs  Lab 07/07/20 1045  GLUCAP 112*   D-Dimer No results for input(s): DDIMER in the last 72 hours. Hgb A1c No results for input(s): HGBA1C in the last 72 hours. Lipid Profile Recent Labs    07/06/20 0356  CHOL 157  HDL 51  LDLCALC 91  TRIG 77  CHOLHDL 3.1   Thyroid function studies Recent Labs    07/05/20 0555  TSH 0.802   Anemia work up No results for input(s): VITAMINB12, FOLATE, FERRITIN, TIBC, IRON, RETICCTPCT in the last 72 hours. Urinalysis    Component Value Date/Time   COLORURINE STRAW (A) 10/31/2017 0112   APPEARANCEUR CLEAR 10/31/2017 0112   LABSPEC 1.004 (L) 10/31/2017 0112   PHURINE 7.0 10/31/2017 0112   GLUCOSEU NEGATIVE 10/31/2017 0112   HGBUR NEGATIVE 10/31/2017 0112   BILIRUBINUR NEGATIVE 10/31/2017 0112   BILIRUBINUR Neg 10/09/2016 1355   KETONESUR NEGATIVE 10/31/2017 0112   PROTEINUR NEGATIVE 10/31/2017 0112   UROBILINOGEN 0.2 10/09/2016 1355   UROBILINOGEN 0.2 02/03/2012 0156   NITRITE NEGATIVE 10/31/2017 0112   LEUKOCYTESUR NEGATIVE 10/31/2017 0112   Sepsis Labs Invalid input(s): PROCALCITONIN,  WBC,  LACTICIDVEN Microbiology Recent Results (from the past 240 hour(s))  SARS Coronavirus 2 by RT PCR (hospital order, performed in [facility] hospital lab) Nasopharyngeal Nasopharyngeal Swab     Status: None   Collection Time: 07/05/20  1:36 AM   Specimen: Nasopharyngeal Swab   Result Value Ref Range Status   SARS Coronavirus 2 NEGATIVE NEGATIVE Final    Comment: (NOTE) SARS-CoV-2 target nucleic acids are NOT DETECTED.  The SARS-CoV-2 RNA is generally detectable in upper and lower respiratory specimens during the acute phase of infection. The lowest concentration of SARS-CoV-2 viral copies this assay can detect is 250 copies / mL. A negative result does not preclude SARS-CoV-2 infection and should not be used as the sole basis for treatment or other patient management decisions.  A negative result may occur with improper specimen collection / handling, submission of specimen other than nasopharyngeal swab, presence of viral mutation(s) within the areas targeted by this assay, and inadequate number of viral copies (<250 copies / mL). A negative result must be combined with clinical observations, patient history, and epidemiological information.  Fact Sheet for Patients:   StrictlyIdeas.no  Fact Sheet for Healthcare Providers: BankingDealers.co.za  This test is not yet approved or  cleared by the [place] FDA and has been authorized for detection and/or diagnosis of SARS-CoV-2 by FDA under an Emergency Use Authorization (EUA).  This  EUA will remain in effect (meaning this test can be used) for the duration of the COVID-19 declaration under Section 564(b)(1) of the Act, 21 U.S.C. section 360bbb-3(b)(1), unless the authorization is terminated or revoked sooner.  Performed at [facility], [place] [address] [place], [place] [address]   MRSA PCR Screening     Status: None   Collection Time: 07/05/20  9:21 AM   Specimen: Nasal Mucosa; Nasopharyngeal  Result Value Ref Range Status   MRSA by PCR NEGATIVE NEGATIVE Final    Comment:        The GeneXpert MRSA Assay (FDA approved for NASAL specimens only), is one component of a comprehensive MRSA colonization surveillance program. It is not intended to  diagnose MRSA infection nor to guide or monitor treatment for MRSA infections. Performed at [facility], [place] [address], [place], [place] [address]      Time coordinating discharge: 25  minutes  SIGNED: Antonieta Pert, MD  [facility] 07/07/2020, 1:46 PM  If 7PM-7AM, please contact night-coverage [email or web address]

## 2020-07-07 NOTE — Discharge Instructions (Signed)
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
## 2020-07-07 NOTE — Progress Notes (Addendum)
Progress Note  Patient Name: Debra Brooks Date of Encounter: 07/07/2020  [facility] Cardiologist: Truitt Merle, NP  Subjective   No acute overnight events. Breathing much better. Almost back to baseline. Abdominal fullness improved. No chest pain.   Inpatient Medications    Scheduled Meds:  apixaban  5 mg Oral BID   buPROPion  150 mg Oral Daily   carvedilol  3.125 mg Oral BID WC   digoxin  62.5 mcg Oral Daily   furosemide  40 mg Oral Daily   levothyroxine  75 mcg Oral QAC breakfast   melatonin  10 mg Oral QHS   pregabalin  50 mg Oral Daily   ursodiol  300 mg Oral BID   Continuous Infusions:   PRN Meds: ALPRAZolam, nitroGLYCERIN, ondansetron **OR** ondansetron (ZOFRAN) IV   Vital Signs    Vitals:   07/06/20 2007 07/06/20 2349 07/07/20 0532 07/07/20 0940  BP: (!) 145/79 127/87 (!) 149/83   Pulse: 82 81 79 93  Resp: 19 19 19    Temp: 97.6 F (36.4 C) (!) 97.3 F (36.3 C) 97.6 F (36.4 C)   TempSrc: Oral Oral Oral   SpO2: 92% 95% 97%   Weight:   60.3 kg   Height:        Intake/Output Summary (Last 24 hours) at 07/07/2020 0947 Last data filed at 07/07/2020 0200 Gross per 24 hour  Intake 400 ml  Output --  Net 400 ml   Last 3 Weights 07/07/2020 07/06/2020 07/05/2020  Weight (lbs) 132 lb 15 oz 132 lb 14.4 oz 135 lb 9.3 oz  Weight (kg) 60.3 kg 60.283 kg 61.5 kg      Telemetry    Atrial fibrillation with rates in the 80's to 90's at rest. Jumps to 110's with relatively minimal activity. - Personally Reviewed  ECG    No new ECG tracing today. - Personally Reviewed  Physical Exam   GEN: No acute distress.   Neck: Supple. No JVD. Cardiac: Irregularly irregular rhythm with normal rates. No significant murmurs, rubs, or gallops.  Respiratory: Clear to auscultation bilaterally. No wheezes, rhonchi, or rales. GI: Soft, non-tender, non-distended  MS: Chronic mild (trace to 1+) lower extremity edema bilaterally. No deformity. Neuro:  No focal deficits. Psych:  Normal affect.  Labs    High Sensitivity Troponin:   Recent Labs  Lab 07/04/20 2243 07/05/20 0042 07/05/20 0334  TROPONINIHS 68* 93* 131*      Chemistry Recent Labs  Lab 07/04/20 2243 07/04/20 2252 07/06/20 0356 07/06/20 1611 07/07/20 0428  NA 141   < > 139 140 140  K 4.3   < > 2.8* 4.4 4.0  CL 102   < > 100 102 105  CO2 27   < > 28 28 28   GLUCOSE 132*   < > 90 97 81  BUN 22   < > 20 20 20   CREATININE 1.38*   < > 1.05* 1.10* 1.25*  CALCIUM 10.1   < > 9.5 9.7 9.5  PROT 5.9*  --   --   --   --   ALBUMIN 3.5  --   --   --   --   AST 30  --   --   --   --   ALT 25  --   --   --   --   ALKPHOS 74  --   --   --   --   BILITOT 0.9  --   --   --   --  GFRNONAA 33*   < > 46* 43* 37*  GFRAA 38*   < > 53* 50* 43*  ANIONGAP 12   < > 11 10 7    < > = values in this interval not displayed.     Hematology Recent Labs  Lab 07/04/20 2243 07/04/20 2252 07/05/20 0555  WBC 8.9  --  8.6  RBC 4.37  --  4.13  HGB 13.8 13.9 13.0  HCT 43.4 41.0 41.1  MCV 99.3  --  99.5  MCH 31.6  --  31.5  MCHC 31.8  --  31.6  RDW 13.4  --  13.5  PLT 208  --  212    BNP Recent Labs  Lab 07/04/20 2243  BNP 488.5*     DDimer No results for input(s): DDIMER in the last 168 hours.   Radiology    US PELVIC COMPLETE WITH TRANSVAGINAL  Result Date: 07/06/2020 CLINICAL DATA:  Ovarian mass EXAM: TRANSABDOMINAL AND TRANSVAGINAL ULTRASOUND OF PELVIS TECHNIQUE: Both transabdominal and transvaginal ultrasound examinations of the pelvis were performed. Transabdominal technique was performed for global imaging of the pelvis including uterus, ovaries, adnexal regions, and pelvic cul-de-sac. It was necessary to proceed with endovaginal exam following the transabdominal exam to visualize the . COMPARISON:  Ultrasound 09/03/2019 FINDINGS: Uterus Measurements: 4.5 x 2.2 x 3.4 cm = volume: 17.5 mL. Heterogeneous myometrium no focal lesion Endometrium Thickness: 3 mm.  No focal abnormality visualized. Right  ovary Complex cystic mass of the RIGHT ovary is again demonstrated. The mass is predominantly anechoic with mildly thickened septation. RIGHT ovarian mass measures 6.9 x 4.5 x 5.9 cm compared to 7.6 x 5.3 of 6.3 on comparison ultrasound 09/03/2019. No significant internal vascularity. Visually the lesion appears very similar to comparison ultrasound. Left ovary Measurements: Not identified Other findings No free fluid IMPRESSION: 1. Complex cystic mass of the RIGHT ovary with internal septations is not significantly changed from ultrasound 09/03/2019. 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 2. Normal uterus and endometrium. 3. LEFT ovary not identified. Electronically Signed   By: Suzy Bouchard M.D.   On: 07/06/2020 13:33    Cardiac Studies   Echocardiogram 07/07/2020: Impressions:  1. Left ventricular ejection fraction, by estimation, is 35 to 40%. The  left ventricle has moderately decreased function. The left ventricle  demonstrates regional wall motion abnormalities (see scoring  diagram/findings for description). There is  moderate concentric left ventricular hypertrophy. Left ventricular  diastolic parameters are consistent with Grade III diastolic dysfunction  (restrictive). There is severe hypokinesis of the left ventricular, basal  inferior wall.   2. Right ventricular systolic function is normal. The right ventricular  size is normal. There is severely elevated pulmonary artery systolic  pressure.   3. Left atrial size was moderately dilated.   4. Right atrial size was severely dilated.   5. The mitral valve is rheumatic. Moderate to severe mitral valve  regurgitation.   6. Tricuspid valve regurgitation is moderate to severe.   7. The aortic valve is tricuspid. There is moderate calcification of the  aortic valve. There is mild thickening of the aortic valve. Aortic valve  regurgitation is not visualized. Mild to moderate aortic valve  sclerosis/calcification is  present, without  any evidence of aortic stenosis.   8. The inferior vena cava is dilated in size with <50% respiratory  variability, suggesting right atrial pressure of 15 mmHg.   Conclusion(s)/Recommendation(s): EF appears reduced compared to prior.  There is mild-moderate global hypokinesis as well as previously  noted  basal inferior wall motion abnormalitiy. Eccentric MR, at least moderate  and possibly severe. Restrictive  filling, elevated PA pressures, dilated IVC.   Patient Profile     86 y.o. 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 Hal Hope.  Assessment & Plan    Acute on Chronic Diastolic CHF - Patient presented with sudden onset of shortness of breath and abdominal fullness.  - BNP elevated in the 400's.  - Chest x-ray consistent with CHF with cardiomegaly, bilateral pleural effusion, vascular congestion, and mild pulmonary edema. - Prior Echo in 2019 showed LVEF of 45-50% with grade 2 diastolic dysfunction. - Repeat Echo this admission showed LVEF of 35-40% with severe hypokinesis of basal inferior wall, grade III diastolic dysfunction, moderate to severe MR, and moderate to severe TR, severely elevated PASP.  - Initially diursed with IV Lasix but switched to PO Lasix 40mg  daily today. No documented urinary output from yesterday. Weight down 7 lbs since admission. Creatinine slightly higher from yesterday but stable. - Appears euvolemic on exam.  - Will increase Coreg to 6.25mg  twice daily. - Will hold off on adding  ACEi/ARB at this time. Given advanced age, don't want to lower BP too much. - CHF exacerbation possibly due to moderate to severe TR and MR.  - Discussed importance of weighing daily and limiting sodium and fluid intake.   Chronic Atrial Fibrillation  History of Atrial Flutter s/p Ablation in 2007 Non-Sustained VT - Presented with atrial fibrillation with RVR. Received IV Metoprolol 5mg  with improvement in rates.  - Rates in the 80's to 90's at rest and will increase to 110's with minimal activity. - Potassium 4.1 yesterday but 2.8 today. Being repleted. - Magnesium 2.0 yesterday. - TSH normal. - Will increase Coreg 6.25mg  twice daily. - Continue home Digoxin 62.5 mcg. - Continue Eliquis 5mg  twice daily.  Moderate to Severe MR Moderate to Severe TR - Noted on Echo this admission. Last Echo in 2019 showed moderate MR and TR so some progression.  - Plan is for conservative treatment at this time given advanced age. Patient is in complete agreement with this and does not want any invasive procedures.  Demand Ischemia CAD - History of remote cath showing RCA disease which was treated medically. - EKG shows no acute ischemic changes.  - High-sensitivity troponin mildly elevated 68 >> 93 >> 131. - No angina. - Echo as above. - Suspect demand ischemia in setting of acute hypoxic respiratory failure due to CHF and atrial fibrillation with RVR. Do no anticipate any ischemic evaluation.   Hypertension - BP mildly elevated at times. - Will increase Coreg as above.   Hyperlipidemia - History of hyperlipidemia but does not appear to be on any cholesterol medications.  - Lipid panel this admission: Total Cholesterol 157, Triglycerides 77, HDL 51, LDL 91. - Given age, I don't think we necessarily need to be aggressive in treating this.    Hypokalemia - Potassium 2.8 today but 4.4 on repeat yesterday afternoon (suspect 2.8 was an error). Stable at 4.0 today. - Continue to monitor closely.  Ovarian Mass - CT in 07/2019 showed complex cystic ovarian mass high worrisome for cystic ovarian neoplasm. Pelvic ultrasound in 08/2019 showed 7.6 x 5.3 x 6.0 cm complex multi septate right ovarian mass again highly concerning for cystic ovarian neoplasm. Patient declined any further evaluation af the time. - Patient had repeat transvaginal ultrasound during admission. No significant change in size of mass. - Follow-up with PCP.   Otherwise, per primary team.  Disposition: Patient is OK for discharge from a cardiac standpoint. Dr. Gwenlyn Found has seen and agrees. Coreg increased as above. Patient already has follow-up with Truitt Merle, NP, scheduled for 07/26/2020.  For questions or updates, please contact [place] Please consult [email or web address] for contact info under        Signed, Darreld Mclean, PA-C  07/07/2020, 9:47 AM     Agree with note by Sande Rives, PA-C  Pt appears clinically improved this AM. No clinical evid of CHF. HR mildly elevated and will adjust BB. On Eliquis. 2D showed mildly lower EF than 2D in 2019 (35-40%) with mod to severe MR. Suspect the decline in EF is probably related to the log standing severe MR. She is not a candidate for any invasive procedure and is stable for DC home with close OP follow up   Lorretta Harp, M.D., [place], [facility], [place]. [place], [place]  [id number]  [phone] 07/07/2020 3:48 PM

## 2020-07-07 NOTE — Progress Notes (Signed)
  Echocardiogram 2D Echocardiogram has been performed.  Debra Brooks 07/07/2020, 9:28 AM

## 2020-07-11 ENCOUNTER — Non-Acute Institutional Stay (SKILLED_NURSING_FACILITY): Payer: Medicare Other | Admitting: Adult Health

## 2020-07-11 DIAGNOSIS — I5042 Chronic combined systolic (congestive) and diastolic (congestive) heart failure: Secondary | ICD-10-CM | POA: Diagnosis not present

## 2020-07-11 DIAGNOSIS — I2510 Atherosclerotic heart disease of native coronary artery without angina pectoris: Secondary | ICD-10-CM

## 2020-07-11 DIAGNOSIS — R278 Other lack of coordination: Secondary | ICD-10-CM | POA: Diagnosis not present

## 2020-07-11 DIAGNOSIS — E039 Hypothyroidism, unspecified: Secondary | ICD-10-CM

## 2020-07-11 DIAGNOSIS — N1832 Chronic kidney disease, stage 3b: Secondary | ICD-10-CM | POA: Diagnosis not present

## 2020-07-11 DIAGNOSIS — H353221 Exudative age-related macular degeneration, left eye, with active choroidal neovascularization: Secondary | ICD-10-CM

## 2020-07-11 DIAGNOSIS — I4821 Permanent atrial fibrillation: Secondary | ICD-10-CM

## 2020-07-11 DIAGNOSIS — M6389 Disorders of muscle in diseases classified elsewhere, multiple sites: Secondary | ICD-10-CM | POA: Diagnosis not present

## 2020-07-11 DIAGNOSIS — I10 Essential (primary) hypertension: Secondary | ICD-10-CM

## 2020-07-11 DIAGNOSIS — N83201 Unspecified ovarian cyst, right side: Secondary | ICD-10-CM | POA: Diagnosis not present

## 2020-07-11 DIAGNOSIS — I5043 Acute on chronic combined systolic (congestive) and diastolic (congestive) heart failure: Secondary | ICD-10-CM

## 2020-07-11 DIAGNOSIS — I872 Venous insufficiency (chronic) (peripheral): Secondary | ICD-10-CM

## 2020-07-11 DIAGNOSIS — R2689 Other abnormalities of gait and mobility: Secondary | ICD-10-CM | POA: Diagnosis not present

## 2020-07-12 ENCOUNTER — Encounter: Payer: Self-pay | Admitting: Adult Health

## 2020-07-12 DIAGNOSIS — I5042 Chronic combined systolic (congestive) and diastolic (congestive) heart failure: Secondary | ICD-10-CM | POA: Diagnosis not present

## 2020-07-12 DIAGNOSIS — R2689 Other abnormalities of gait and mobility: Secondary | ICD-10-CM | POA: Diagnosis not present

## 2020-07-12 DIAGNOSIS — R278 Other lack of coordination: Secondary | ICD-10-CM | POA: Diagnosis not present

## 2020-07-12 DIAGNOSIS — M6389 Disorders of muscle in diseases classified elsewhere, multiple sites: Secondary | ICD-10-CM | POA: Diagnosis not present

## 2020-07-12 DIAGNOSIS — I5020 Unspecified systolic (congestive) heart failure: Secondary | ICD-10-CM | POA: Diagnosis not present

## 2020-07-12 NOTE — Telephone Encounter (Signed)
Patient's son is following up regarding echocardiogram, completed on 07/07/20. He is requesting a call back from Truitt Merle to further discuss.

## 2020-07-12 NOTE — Telephone Encounter (Signed)
I have called Shanon Brow - Dolly's son - we discussed recent admission - echo findings.  EF is lower. Has worsening valvular heart disease along with worsening pulmonary HTN.  Had probably had some extra salt (went to a picnic and had ribs/watermelon) - this may have been the trigger.   Goal remains quality of care.   Will see back as planned. She will most likely be returning to independent living later this week.   Burtis Junes, RN, [place] [address] [place], [place]  [address] [phone]
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
## 2020-07-14 ENCOUNTER — Telehealth: Payer: Self-pay

## 2020-07-14 NOTE — Telephone Encounter (Signed)
Patient called stating she was recently in the hospital and was told that she needed to double her lasix.   Patient states the hospital was under the impression she was on Lasix 20 mg and doubled her dose to 40 mg  Patient was in rehab and recently released, upon returning to her home patient checked her pill bottle and it states Lasix 40 mg. Patient states she been on 40 mg for the last year or so and questions if she should be on 80 mg since it was recommended in the hospital that she double her dose.   Please advise

## 2020-07-14 NOTE — Telephone Encounter (Signed)
I was out of town and not involved in [facility] care while she was in rehab.  Alyse Low, are you able to address her question?  Thanks.

## 2020-07-14 NOTE — Telephone Encounter (Signed)
[name] was discharged from rehab on 40 mg of lasix daily. Her weight had been stable. She should continue on this dose and continue to monitor her weight and for s/s of CHF as discussed in our visit. She can take another 20 mg qd prn for weight gain or leg swelling.

## 2020-07-14 NOTE — Telephone Encounter (Signed)
Patient called back and stated that today she has taken 60mg  of Lasix for today. Stated she took a 40mg  and then took another 1/2 (20mg ) until she heard back from Dr. Mariea Clonts.  FYI

## 2020-07-15 DIAGNOSIS — I5042 Chronic combined systolic (congestive) and diastolic (congestive) heart failure: Secondary | ICD-10-CM | POA: Diagnosis not present

## 2020-07-15 DIAGNOSIS — R278 Other lack of coordination: Secondary | ICD-10-CM | POA: Diagnosis not present

## 2020-07-15 DIAGNOSIS — M6389 Disorders of muscle in diseases classified elsewhere, multiple sites: Secondary | ICD-10-CM | POA: Diagnosis not present

## 2020-07-15 DIAGNOSIS — R2689 Other abnormalities of gait and mobility: Secondary | ICD-10-CM | POA: Diagnosis not present

## 2020-07-15 NOTE — Telephone Encounter (Signed)
Left detailed message on voicemail for patient with Dr.Reed's and Cindi Carbon, NP response.  I repeated Christy response x 2 in voicemail message and advised patient to call and speak with clinical intake assistant if she has additional questions or concerns.

## 2020-07-18 DIAGNOSIS — R278 Other lack of coordination: Secondary | ICD-10-CM | POA: Diagnosis not present

## 2020-07-18 DIAGNOSIS — I5042 Chronic combined systolic (congestive) and diastolic (congestive) heart failure: Secondary | ICD-10-CM | POA: Diagnosis not present

## 2020-07-18 DIAGNOSIS — M6389 Disorders of muscle in diseases classified elsewhere, multiple sites: Secondary | ICD-10-CM | POA: Diagnosis not present

## 2020-07-18 DIAGNOSIS — R2689 Other abnormalities of gait and mobility: Secondary | ICD-10-CM | POA: Diagnosis not present
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
## 2020-07-20 ENCOUNTER — Encounter: Payer: Self-pay | Admitting: Internal Medicine

## 2020-07-20 ENCOUNTER — Non-Acute Institutional Stay: Payer: Medicare Other | Admitting: Internal Medicine

## 2020-07-20 ENCOUNTER — Other Ambulatory Visit: Payer: Self-pay

## 2020-07-20 VITALS — BP 122/78 | HR 77 | Temp 98.4°F | Ht 66.0 in | Wt 132.2 lb

## 2020-07-20 DIAGNOSIS — I4891 Unspecified atrial fibrillation: Secondary | ICD-10-CM | POA: Diagnosis not present

## 2020-07-20 DIAGNOSIS — N1832 Chronic kidney disease, stage 3b: Secondary | ICD-10-CM | POA: Diagnosis not present

## 2020-07-20 DIAGNOSIS — I252 Old myocardial infarction: Secondary | ICD-10-CM | POA: Diagnosis not present

## 2020-07-20 DIAGNOSIS — I4821 Permanent atrial fibrillation: Secondary | ICD-10-CM | POA: Diagnosis not present

## 2020-07-20 DIAGNOSIS — N83201 Unspecified ovarian cyst, right side: Secondary | ICD-10-CM

## 2020-07-20 DIAGNOSIS — Z66 Do not resuscitate: Secondary | ICD-10-CM

## 2020-07-20 DIAGNOSIS — I2510 Atherosclerotic heart disease of native coronary artery without angina pectoris: Secondary | ICD-10-CM

## 2020-07-20 DIAGNOSIS — D6869 Other thrombophilia: Secondary | ICD-10-CM

## 2020-07-20 DIAGNOSIS — I5043 Acute on chronic combined systolic (congestive) and diastolic (congestive) heart failure: Secondary | ICD-10-CM

## 2020-07-20 DIAGNOSIS — I872 Venous insufficiency (chronic) (peripheral): Secondary | ICD-10-CM | POA: Diagnosis not present

## 2020-07-20 DIAGNOSIS — J9601 Acute respiratory failure with hypoxia: Secondary | ICD-10-CM | POA: Diagnosis not present

## 2020-07-20 MED ORDER — POTASSIUM CHLORIDE CRYS ER 20 MEQ PO TBCR
EXTENDED_RELEASE_TABLET | ORAL | 1 refills | Status: DC
Start: 2020-07-20 — End: 2020-11-07
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
## 2020-07-22 DIAGNOSIS — R2689 Other abnormalities of gait and mobility: Secondary | ICD-10-CM | POA: Diagnosis not present

## 2020-07-22 DIAGNOSIS — M6389 Disorders of muscle in diseases classified elsewhere, multiple sites: Secondary | ICD-10-CM | POA: Diagnosis not present

## 2020-07-22 DIAGNOSIS — R278 Other lack of coordination: Secondary | ICD-10-CM | POA: Diagnosis not present

## 2020-07-22 DIAGNOSIS — I5042 Chronic combined systolic (congestive) and diastolic (congestive) heart failure: Secondary | ICD-10-CM | POA: Diagnosis not present

## 2020-07-25 ENCOUNTER — Other Ambulatory Visit: Payer: Self-pay | Admitting: Nurse Practitioner

## 2020-07-25 NOTE — Telephone Encounter (Signed)
Prescription refill request for Eliquis received.  Last office visit: Debra Brooks 03/15/2020 Scr: 1.25, 07/07/2020 Age: 86  Weight: 60 kg

## 2020-07-25 NOTE — Telephone Encounter (Signed)
Pt is to see Cecille Rubin on 07/26/2020, Will see what pt's weight it to see if pt qualifies for a dose decrease.

## 2020-07-26 ENCOUNTER — Other Ambulatory Visit: Payer: Self-pay

## 2020-07-26 ENCOUNTER — Encounter: Payer: Self-pay | Admitting: Nurse Practitioner

## 2020-07-26 ENCOUNTER — Ambulatory Visit (INDEPENDENT_AMBULATORY_CARE_PROVIDER_SITE_OTHER): Payer: Medicare Other | Admitting: Nurse Practitioner

## 2020-07-26 VITALS — BP 160/80 | HR 78 | Ht 60.5 in | Wt 136.4 lb

## 2020-07-26 DIAGNOSIS — Z7901 Long term (current) use of anticoagulants: Secondary | ICD-10-CM | POA: Diagnosis not present

## 2020-07-26 DIAGNOSIS — I4819 Other persistent atrial fibrillation: Secondary | ICD-10-CM | POA: Diagnosis not present

## 2020-07-26 DIAGNOSIS — Z79899 Other long term (current) drug therapy: Secondary | ICD-10-CM | POA: Diagnosis not present

## 2020-07-26 DIAGNOSIS — I5042 Chronic combined systolic (congestive) and diastolic (congestive) heart failure: Secondary | ICD-10-CM | POA: Diagnosis not present

## 2020-07-26 DIAGNOSIS — I2510 Atherosclerotic heart disease of native coronary artery without angina pectoris: Secondary | ICD-10-CM

## 2020-07-26 DIAGNOSIS — I10 Essential (primary) hypertension: Secondary | ICD-10-CM

## 2020-07-26 NOTE — Telephone Encounter (Signed)
Pt's weight at today's (928/21) OV with Truitt Merle was 61.9kg, age 86, last labs Creat 1.25 on 07/07/20.  Based on specified criteria pt is on appropriate dosage of Eliquis 5mg  BID.  Will refill rx.

## 2020-07-26 NOTE — Patient Instructions (Addendum)
After Visit Summary:  We will be checking the following labs today - BMET   Medication Instructions:    Continue with your current medicines.    If you need a refill on your cardiac medications before your next appointment, please call your pharmacy.     Testing/Procedures To Be Arranged:  N/A  Follow-Up:   See me in about 2 months.     At [facility], you and your health needs are our priority.  As part of our continuing mission to provide you with exceptional heart care, we have created designated Provider Care Teams.  These Care Teams include your primary Cardiologist (physician) and Advanced Practice Providers (APPs -  Physician Assistants and Nurse Practitioners) who all work together to provide you with the care you need, when you need it.  Special Instructions:  . Stay safe, wash your hands for at least 20 seconds and wear a mask when needed.  . It was good to talk with you today.    Call the [place] office at [phone] if you have any questions, problems or concerns.

## 2020-07-27 LAB — BASIC METABOLIC PANEL
BUN/Creatinine Ratio: 22 (ref 12–28)
BUN: 23 mg/dL (ref 10–36)
CO2: 29 mmol/L (ref 20–29)
Calcium: 10.6 mg/dL — ABNORMAL HIGH (ref 8.7–10.3)
Chloride: 103 mmol/L (ref 96–106)
Creatinine, Ser: 1.04 mg/dL — ABNORMAL HIGH (ref 0.57–1.00)
GFR calc Af Amer: 54 mL/min/{1.73_m2} — ABNORMAL LOW (ref >59)
GFR calc non Af Amer: 46 mL/min/{1.73_m2} — ABNORMAL LOW (ref >59)
Glucose: 97 mg/dL (ref 65–99)
Potassium: 4.4 mmol/L (ref 3.5–5.2)
Sodium: 145 mmol/L — ABNORMAL HIGH (ref 134–144)

## 2020-07-28 DIAGNOSIS — R278 Other lack of coordination: Secondary | ICD-10-CM | POA: Diagnosis not present

## 2020-07-28 DIAGNOSIS — M6389 Disorders of muscle in diseases classified elsewhere, multiple sites: Secondary | ICD-10-CM | POA: Diagnosis not present

## 2020-07-28 DIAGNOSIS — I5042 Chronic combined systolic (congestive) and diastolic (congestive) heart failure: Secondary | ICD-10-CM | POA: Diagnosis not present

## 2020-07-28 DIAGNOSIS — R2689 Other abnormalities of gait and mobility: Secondary | ICD-10-CM | POA: Diagnosis not present

## 2020-08-03 DIAGNOSIS — R278 Other lack of coordination: Secondary | ICD-10-CM | POA: Diagnosis not present

## 2020-08-03 DIAGNOSIS — I5042 Chronic combined systolic (congestive) and diastolic (congestive) heart failure: Secondary | ICD-10-CM | POA: Diagnosis not present

## 2020-08-03 DIAGNOSIS — M6389 Disorders of muscle in diseases classified elsewhere, multiple sites: Secondary | ICD-10-CM | POA: Diagnosis not present

## 2020-08-20 DIAGNOSIS — I5042 Chronic combined systolic (congestive) and diastolic (congestive) heart failure: Secondary | ICD-10-CM | POA: Diagnosis not present

## 2020-08-20 DIAGNOSIS — R278 Other lack of coordination: Secondary | ICD-10-CM | POA: Diagnosis not present

## 2020-08-20 DIAGNOSIS — M6389 Disorders of muscle in diseases classified elsewhere, multiple sites: Secondary | ICD-10-CM | POA: Diagnosis not present

## 2020-08-22 DIAGNOSIS — I5042 Chronic combined systolic (congestive) and diastolic (congestive) heart failure: Secondary | ICD-10-CM | POA: Diagnosis not present

## 2020-08-22 DIAGNOSIS — M6389 Disorders of muscle in diseases classified elsewhere, multiple sites: Secondary | ICD-10-CM | POA: Diagnosis not present

## 2020-08-22 DIAGNOSIS — R278 Other lack of coordination: Secondary | ICD-10-CM | POA: Diagnosis not present

## 2020-08-23 ENCOUNTER — Telehealth: Payer: Self-pay | Admitting: *Deleted

## 2020-08-23 NOTE — Telephone Encounter (Signed)
S/w pt due to pt needed to be seen sooner due to TOC.  Pt was told Debra Brooks wouldn't be here the day of scheduled appt.  Debra Brooks is [place] that day.  Moved pt's appt to Nov 30. Pt is agreeable.

## 2020-08-24 DIAGNOSIS — Z85038 Personal history of other malignant neoplasm of large intestine: Secondary | ICD-10-CM | POA: Diagnosis not present

## 2020-08-24 DIAGNOSIS — R935 Abnormal findings on diagnostic imaging of other abdominal regions, including retroperitoneum: Secondary | ICD-10-CM | POA: Diagnosis not present

## 2020-08-24 DIAGNOSIS — K8020 Calculus of gallbladder without cholecystitis without obstruction: Secondary | ICD-10-CM | POA: Diagnosis not present

## 2020-08-24 DIAGNOSIS — R1900 Intra-abdominal and pelvic swelling, mass and lump, unspecified site: Secondary | ICD-10-CM | POA: Diagnosis not present

## 2020-08-24 DIAGNOSIS — Z7901 Long term (current) use of anticoagulants: Secondary | ICD-10-CM | POA: Diagnosis not present

## 2020-08-24 DIAGNOSIS — R634 Abnormal weight loss: Secondary | ICD-10-CM | POA: Diagnosis not present

## 2020-08-24 DIAGNOSIS — K5901 Slow transit constipation: Secondary | ICD-10-CM | POA: Diagnosis not present

## 2020-08-24 DIAGNOSIS — R198 Other specified symptoms and signs involving the digestive system and abdomen: Secondary | ICD-10-CM | POA: Diagnosis not present

## 2020-08-24 DIAGNOSIS — K52832 Lymphocytic colitis: Secondary | ICD-10-CM | POA: Diagnosis not present

## 2020-08-26 ENCOUNTER — Other Ambulatory Visit: Payer: Self-pay | Admitting: Internal Medicine

## 2020-08-26 DIAGNOSIS — I5042 Chronic combined systolic (congestive) and diastolic (congestive) heart failure: Secondary | ICD-10-CM | POA: Diagnosis not present

## 2020-08-26 DIAGNOSIS — R278 Other lack of coordination: Secondary | ICD-10-CM | POA: Diagnosis not present

## 2020-08-26 DIAGNOSIS — M6389 Disorders of muscle in diseases classified elsewhere, multiple sites: Secondary | ICD-10-CM | POA: Diagnosis not present

## 2020-09-13 DIAGNOSIS — Z23 Encounter for immunization: Secondary | ICD-10-CM | POA: Diagnosis not present
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
## 2020-09-17 ENCOUNTER — Other Ambulatory Visit: Payer: Self-pay | Admitting: Internal Medicine

## 2020-09-19 NOTE — Telephone Encounter (Signed)
rx sent to pharmacy by e-script  

## 2020-09-27 ENCOUNTER — Encounter: Payer: Self-pay | Admitting: Nurse Practitioner

## 2020-09-27 ENCOUNTER — Ambulatory Visit (INDEPENDENT_AMBULATORY_CARE_PROVIDER_SITE_OTHER): Payer: Medicare Other | Admitting: Nurse Practitioner

## 2020-09-27 ENCOUNTER — Other Ambulatory Visit: Payer: Self-pay

## 2020-09-27 VITALS — BP 134/70 | HR 93 | Ht 60.05 in | Wt 133.0 lb

## 2020-09-27 DIAGNOSIS — I5042 Chronic combined systolic (congestive) and diastolic (congestive) heart failure: Secondary | ICD-10-CM | POA: Diagnosis not present

## 2020-09-27 DIAGNOSIS — Z7901 Long term (current) use of anticoagulants: Secondary | ICD-10-CM

## 2020-09-27 DIAGNOSIS — I2510 Atherosclerotic heart disease of native coronary artery without angina pectoris: Secondary | ICD-10-CM

## 2020-09-27 DIAGNOSIS — Z79899 Other long term (current) drug therapy: Secondary | ICD-10-CM

## 2020-09-27 DIAGNOSIS — I10 Essential (primary) hypertension: Secondary | ICD-10-CM

## 2020-09-27 DIAGNOSIS — I4819 Other persistent atrial fibrillation: Secondary | ICD-10-CM | POA: Diagnosis not present

## 2020-09-27 NOTE — Patient Instructions (Addendum)
After Visit Summary:  We will be checking the following labs today - NONE   Medication Instructions:    Continue with your current medicines.    If you need a refill on your cardiac medications before your next appointment, please call your pharmacy.     Testing/Procedures To Be Arranged:  N/A  Follow-Up:   See me in February - we will then transition over to Dr. Johney Frame.     At [facility], you and your health needs are our priority.  As part of our continuing mission to provide you with exceptional heart care, we have created designated Provider Care Teams.  These Care Teams include your primary Cardiologist (physician) and Advanced Practice Providers (APPs -  Physician Assistants and Nurse Practitioners) who all work together to provide you with the care you need, when you need it.  Special Instructions:  . Stay safe, wash your hands for at least 20 seconds and wear a mask when needed.  . It was good to talk with you today.    Call the [place] office at [phone] if you have any questions, problems or concerns.

## 2020-10-04 ENCOUNTER — Ambulatory Visit: Payer: Medicare Other | Admitting: Nurse Practitioner

## 2020-10-05 ENCOUNTER — Ambulatory Visit: Payer: Medicare Other | Admitting: Nurse Practitioner

## 2020-10-05 ENCOUNTER — Other Ambulatory Visit: Payer: Self-pay | Admitting: Internal Medicine

## 2020-10-05 ENCOUNTER — Other Ambulatory Visit: Payer: Self-pay | Admitting: *Deleted

## 2020-10-05 MED ORDER — LEVOTHYROXINE SODIUM 75 MCG PO TABS: 75.0000 ug | ORAL_TABLET | Freq: Every day | ORAL | 0 refills | Status: DC

## 2020-10-05 NOTE — Telephone Encounter (Signed)
Received refill Request from [facility]

## 2020-10-07 ENCOUNTER — Other Ambulatory Visit: Payer: Self-pay | Admitting: Internal Medicine

## 2020-10-07 NOTE — Telephone Encounter (Signed)
Last refill indicated an end date of 09/27/20 after 30 doses  I will send to [place], DO to review and confirm if patient should continue medication  Please advise

## 2020-10-12 ENCOUNTER — Other Ambulatory Visit: Payer: Self-pay | Admitting: Internal Medicine

## 2020-10-14 DIAGNOSIS — D692 Other nonthrombocytopenic purpura: Secondary | ICD-10-CM | POA: Diagnosis not present

## 2020-10-14 DIAGNOSIS — L821 Other seborrheic keratosis: Secondary | ICD-10-CM | POA: Diagnosis not present

## 2020-10-14 DIAGNOSIS — D1801 Hemangioma of skin and subcutaneous tissue: Secondary | ICD-10-CM | POA: Diagnosis not present

## 2020-10-14 DIAGNOSIS — L300 Nummular dermatitis: Secondary | ICD-10-CM | POA: Diagnosis not present

## 2020-10-14 DIAGNOSIS — L570 Actinic keratosis: Secondary | ICD-10-CM | POA: Diagnosis not present

## 2020-10-14 DIAGNOSIS — L814 Other melanin hyperpigmentation: Secondary | ICD-10-CM | POA: Diagnosis not present

## 2020-10-19 ENCOUNTER — Non-Acute Institutional Stay: Payer: Medicare Other | Admitting: Internal Medicine

## 2020-10-19 ENCOUNTER — Encounter: Payer: Self-pay | Admitting: Internal Medicine

## 2020-10-19 ENCOUNTER — Other Ambulatory Visit: Payer: Self-pay

## 2020-10-19 VITALS — BP 132/68 | HR 64 | Temp 98.1°F | Ht 60.0 in | Wt 132.6 lb

## 2020-10-19 DIAGNOSIS — I2510 Atherosclerotic heart disease of native coronary artery without angina pectoris: Secondary | ICD-10-CM | POA: Diagnosis not present

## 2020-10-19 DIAGNOSIS — H6122 Impacted cerumen, left ear: Secondary | ICD-10-CM

## 2020-10-19 DIAGNOSIS — S20219A Contusion of unspecified front wall of thorax, initial encounter: Secondary | ICD-10-CM | POA: Diagnosis not present

## 2020-10-19 DIAGNOSIS — W19XXXA Unspecified fall, initial encounter: Secondary | ICD-10-CM | POA: Diagnosis not present
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
## 2020-11-07 ENCOUNTER — Other Ambulatory Visit: Payer: Self-pay | Admitting: Nurse Practitioner

## 2020-11-09 ENCOUNTER — Encounter: Payer: Self-pay | Admitting: Internal Medicine

## 2020-11-09 ENCOUNTER — Other Ambulatory Visit: Payer: Self-pay

## 2020-11-09 ENCOUNTER — Non-Acute Institutional Stay: Payer: Medicare Other | Admitting: Internal Medicine

## 2020-11-09 VITALS — BP 120/84 | HR 68 | Temp 98.1°F | Ht 60.0 in | Wt 136.0 lb

## 2020-11-09 DIAGNOSIS — I700 Atherosclerosis of aorta: Secondary | ICD-10-CM

## 2020-11-09 DIAGNOSIS — N83201 Unspecified ovarian cyst, right side: Secondary | ICD-10-CM

## 2020-11-09 DIAGNOSIS — F325 Major depressive disorder, single episode, in full remission: Secondary | ICD-10-CM | POA: Diagnosis not present

## 2020-11-09 DIAGNOSIS — I5042 Chronic combined systolic (congestive) and diastolic (congestive) heart failure: Secondary | ICD-10-CM | POA: Diagnosis not present

## 2020-11-09 DIAGNOSIS — H353221 Exudative age-related macular degeneration, left eye, with active choroidal neovascularization: Secondary | ICD-10-CM | POA: Diagnosis not present

## 2020-11-09 DIAGNOSIS — I4891 Unspecified atrial fibrillation: Secondary | ICD-10-CM

## 2020-11-09 DIAGNOSIS — J439 Emphysema, unspecified: Secondary | ICD-10-CM

## 2020-11-09 DIAGNOSIS — R1907 Generalized intra-abdominal and pelvic swelling, mass and lump: Secondary | ICD-10-CM

## 2020-11-09 DIAGNOSIS — D6869 Other thrombophilia: Secondary | ICD-10-CM

## 2020-11-09 DIAGNOSIS — I4819 Other persistent atrial fibrillation: Secondary | ICD-10-CM
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
## 2020-11-18 ENCOUNTER — Telehealth: Payer: Self-pay | Admitting: Nurse Practitioner

## 2020-11-18 NOTE — Telephone Encounter (Signed)
New message:     Patient son would like for a nurse or Cecille Rubin concering his mother.

## 2020-11-18 NOTE — Telephone Encounter (Signed)
Spoke with pt's son who requests a phone call from Kathrene Alu re: pt and the progression of her CHF.  Pt advised Cecille Rubin is out of the office until Tuesday 11/22/2020 but will forward message to her.  Pt's son verbalizes understanding and thanked Therapist, sports for call.

## 2020-11-22 NOTE — Telephone Encounter (Signed)
I have called Debra Brooks today - he had concerns about his brother who has been found to have CHF/CAD - currently being treated in [place].   Also wanted to thank me for taking care of his mom all these years.   Cecille Rubin
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
## 2020-12-06 ENCOUNTER — Other Ambulatory Visit: Payer: Self-pay

## 2020-12-06 ENCOUNTER — Encounter: Payer: Self-pay | Admitting: Nurse Practitioner

## 2020-12-06 ENCOUNTER — Ambulatory Visit (INDEPENDENT_AMBULATORY_CARE_PROVIDER_SITE_OTHER): Payer: Medicare Other | Admitting: Nurse Practitioner

## 2020-12-06 VITALS — BP 118/68 | HR 77 | Ht 60.0 in | Wt 133.0 lb

## 2020-12-06 DIAGNOSIS — I10 Essential (primary) hypertension: Secondary | ICD-10-CM

## 2020-12-06 DIAGNOSIS — Z7901 Long term (current) use of anticoagulants: Secondary | ICD-10-CM | POA: Diagnosis not present

## 2020-12-06 DIAGNOSIS — I4819 Other persistent atrial fibrillation: Secondary | ICD-10-CM

## 2020-12-06 DIAGNOSIS — I5042 Chronic combined systolic (congestive) and diastolic (congestive) heart failure: Secondary | ICD-10-CM

## 2020-12-06 NOTE — Patient Instructions (Addendum)
After Visit Summary:  We will be checking the following labs today - NONE   Medication Instructions:    Continue with your current medicines.    If you need a refill on your cardiac medications before your next appointment, please call your pharmacy.     Testing/Procedures To Be Arranged:  N/A  Follow-Up:   See Dr. Johney Frame in 4 months     At [facility], you and your health needs are our priority.  As part of our continuing mission to provide you with exceptional heart care, we have created designated Provider Care Teams.  These Care Teams include your primary Cardiologist (physician) and Advanced Practice Providers (APPs -  Physician Assistants and Nurse Practitioners) who all work together to provide you with the care you need, when you need it.  Special Instructions:  . Stay safe, wash your hands for at least 20 seconds and wear a mask when needed.  . It was good to talk with you today.    Call the [place] office at [phone] if you have any questions, problems or concerns.

## 2020-12-09 ENCOUNTER — Other Ambulatory Visit: Payer: Self-pay | Admitting: Internal Medicine

## 2020-12-09 DIAGNOSIS — G609 Hereditary and idiopathic neuropathy, unspecified: Secondary | ICD-10-CM

## 2020-12-16 ENCOUNTER — Other Ambulatory Visit: Payer: Self-pay | Admitting: Internal Medicine

## 2020-12-16 DIAGNOSIS — F419 Anxiety disorder, unspecified: Secondary | ICD-10-CM

## 2020-12-16 NOTE — Telephone Encounter (Signed)
RX last filled on 05/30/2020 #90,1 refill  Treatment agreement on file from 09/2019  Patient was seen on 11/09/2020, pending appointment with Dr.Reed 02/15/2021 (notation made to update treatment agreement)

## 2020-12-19 ENCOUNTER — Encounter: Payer: Self-pay | Admitting: Internal Medicine

## 2021-01-01 ENCOUNTER — Other Ambulatory Visit: Payer: Self-pay | Admitting: Internal Medicine

## 2021-01-12 ENCOUNTER — Encounter: Payer: Medicare Other | Admitting: Family

## 2021-01-17 ENCOUNTER — Other Ambulatory Visit: Payer: Self-pay

## 2021-01-17 ENCOUNTER — Ambulatory Visit (INDEPENDENT_AMBULATORY_CARE_PROVIDER_SITE_OTHER): Payer: Medicare Other | Admitting: Family

## 2021-01-17 ENCOUNTER — Encounter: Payer: Self-pay | Admitting: Family

## 2021-01-17 DIAGNOSIS — Z0000 Encounter for general adult medical examination without abnormal findings: Secondary | ICD-10-CM
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
## 2021-01-18 NOTE — Patient Instructions (Signed)
Debra Brooks , Thank you for taking time to come for your Medicare Wellness Visit. I appreciate your ongoing commitment to your health goals. Please review the following plan we discussed and let me know if I can assist you in the future.   Screening recommendations/referrals: Colonoscopy: N/A  Mammogram: N/A  Bone Density: Up to date  Recommended yearly ophthalmology/optometry visit for glaucoma screening and checkup Recommended yearly dental visit for hygiene and checkup  Vaccinations: Influenza vaccine: Up to date  Pneumococcal vaccine : Up to date Tdap vaccine : Up to date Shingles vaccine : Up to date  Advanced directives: Yes   Conditions/risks identified: Advance age female > 17 yrs and Hx of smoking   Next appointment: 1 year    Preventive Care 65 Years and Older, Female Preventive care refers to lifestyle choices and visits with your health care provider that can promote health and wellness. What does preventive care include?  A yearly physical exam. This is also called an annual well check.  Dental exams once or twice a year.  Routine eye exams. Ask your health care provider how often you should have your eyes checked.  Personal lifestyle choices, including:  Daily care of your teeth and gums.  Regular physical activity.  Eating a healthy diet.  Avoiding tobacco and drug use.  Limiting alcohol use.  Practicing safe sex.  Taking low-dose aspirin every day.  Taking vitamin and mineral supplements as recommended by your health care provider. What happens during an annual well check? The services and screenings done by your health care provider during your annual well check will depend on your age, overall health, lifestyle risk factors, and family history of disease. Counseling  Your health care provider may ask you questions about your:  Alcohol use.  Tobacco use.  Drug use.  Emotional well-being.  Home and relationship well-being.  Sexual  activity.  Eating habits.  History of falls.  Memory and ability to understand (cognition).  Work and work Statistician.  Reproductive health. Screening  You may have the following tests or measurements:  Height, weight, and BMI.  Blood pressure.  Lipid and cholesterol levels. These may be checked every 5 years, or more frequently if you are over 61 years old.  Skin check.  Lung cancer screening. You may have this screening every year starting at age 12 if you have a 30-pack-year history of smoking and currently smoke or have quit within the past 15 years.  Fecal occult blood test (FOBT) of the stool. You may have this test every year starting at age 71.  Flexible sigmoidoscopy or colonoscopy. You may have a sigmoidoscopy every 5 years or a colonoscopy every 10 years starting at age 5.  Hepatitis C blood test.  Hepatitis B blood test.  Sexually transmitted disease (STD) testing.  Diabetes screening. This is done by checking your blood sugar (glucose) after you have not eaten for a while (fasting). You may have this done every 1-3 years.  Bone density scan. This is done to screen for osteoporosis. You may have this done starting at age 63.  Mammogram. This may be done every 1-2 years. Talk to your health care provider about how often you should have regular mammograms. Talk with your health care provider about your test results, treatment options, and if necessary, the need for more tests. Vaccines  Your health care provider may recommend certain vaccines, such as:  Influenza vaccine. This is recommended every year.  Tetanus, diphtheria, and acellular pertussis (Tdap,  Td) vaccine. You may need a Td booster every 10 years.  Zoster vaccine. You may need this after age 45.  Pneumococcal 13-valent conjugate (PCV13) vaccine. One dose is recommended after age 59.  Pneumococcal polysaccharide (PPSV23) vaccine. One dose is recommended after age 79. Talk to your health care  provider about which screenings and vaccines you need and how often you need them. This information is not intended to replace advice given to you by your health care provider. Make sure you discuss any questions you have with your health care provider. Document Released: 11/11/2015 Document Revised: 07/04/2016 Document Reviewed: 08/16/2015 Elsevier Interactive Patient Education  2017 [place] Prevention in the Home Falls can cause injuries. They can happen to people of all ages. There are many things you can do to make your home safe and to help prevent falls. What can I do on the outside of my home?  Regularly fix the edges of walkways and driveways and fix any cracks.  Remove anything that might make you trip as you walk through a door, such as a raised step or threshold.  Trim any bushes or trees on the path to your home.  Use bright outdoor lighting.  Clear any walking paths of anything that might make someone trip, such as rocks or tools.  Regularly check to see if handrails are loose or broken. Make sure that both sides of any steps have handrails.  Any raised decks and porches should have guardrails on the edges.  Have any leaves, snow, or ice cleared regularly.  Use sand or salt on walking paths during winter.  Clean up any spills in your garage right away. This includes oil or grease spills. What can I do in the bathroom?  Use night lights.  Install grab bars by the toilet and in the tub and shower. Do not use towel bars as grab bars.  Use non-skid mats or decals in the tub or shower.  If you need to sit down in the shower, use a plastic, non-slip stool.  Keep the floor dry. Clean up any water that spills on the floor as soon as it happens.  Remove soap buildup in the tub or shower regularly.  Attach bath mats securely with double-sided non-slip rug tape.  Do not have throw rugs and other things on the floor that can make you trip. What can I do in  the bedroom?  Use night lights.  Make sure that you have a light by your bed that is easy to reach.  Do not use any sheets or blankets that are too big for your bed. They should not hang down onto the floor.  Have a firm chair that has side arms. You can use this for support while you get dressed.  Do not have throw rugs and other things on the floor that can make you trip. What can I do in the kitchen?  Clean up any spills right away.  Avoid walking on wet floors.  Keep items that you use a lot in easy-to-reach places.  If you need to reach something above you, use a strong step stool that has a grab bar.  Keep electrical cords out of the way.  Do not use floor polish or wax that makes floors slippery. If you must use wax, use non-skid floor wax.  Do not have throw rugs and other things on the floor that can make you trip. What can I do with my stairs?  Do not  leave any items on the stairs.  Make sure that there are handrails on both sides of the stairs and use them. Fix handrails that are broken or loose. Make sure that handrails are as long as the stairways.  Check any carpeting to make sure that it is firmly attached to the stairs. Fix any carpet that is loose or worn.  Avoid having throw rugs at the top or bottom of the stairs. If you do have throw rugs, attach them to the floor with carpet tape.  Make sure that you have a light switch at the top of the stairs and the bottom of the stairs. If you do not have them, ask someone to add them for you. What else can I do to help prevent falls?  Wear shoes that:  Do not have high heels.  Have rubber bottoms.  Are comfortable and fit you well.  Are closed at the toe. Do not wear sandals.  If you use a stepladder:  Make sure that it is fully opened. Do not climb a closed stepladder.  Make sure that both sides of the stepladder are locked into place.  Ask someone to hold it for you, if possible.  Clearly mark and  make sure that you can see:  Any grab bars or handrails.  First and last steps.  Where the edge of each step is.  Use tools that help you move around (mobility aids) if they are needed. These include:  Canes.  Walkers.  Scooters.  Crutches.  Turn on the lights when you go into a dark area. Replace any light bulbs as soon as they burn out.  Set up your furniture so you have a clear path. Avoid moving your furniture around.  If any of your floors are uneven, fix them.  If there are any pets around you, be aware of where they are.  Review your medicines with your doctor. Some medicines can make you feel dizzy. This can increase your chance of falling. Ask your doctor what other things that you can do to help prevent falls. This information is not intended to replace advice given to you by your health care provider. Make sure you discuss any questions you have with your health care provider. Document Released: 08/11/2009 Document Revised: 03/22/2016 Document Reviewed: 11/19/2014 Elsevier Interactive Patient Education  2017 [employer].

## 2021-01-30 ENCOUNTER — Other Ambulatory Visit: Payer: Self-pay

## 2021-01-30 MED ORDER — ELIQUIS 5 MG PO TABS: 1.0000 | ORAL_TABLET | Freq: Two times a day (BID) | ORAL | 5 refills | Status: DC

## 2021-01-31 DIAGNOSIS — H353114 Nonexudative age-related macular degeneration, right eye, advanced atrophic with subfoveal involvement: Secondary | ICD-10-CM | POA: Diagnosis not present

## 2021-01-31 DIAGNOSIS — Z961 Presence of intraocular lens: Secondary | ICD-10-CM | POA: Diagnosis not present

## 2021-01-31 DIAGNOSIS — H35372 Puckering of macula, left eye: Secondary | ICD-10-CM | POA: Diagnosis not present

## 2021-01-31 DIAGNOSIS — H353221 Exudative age-related macular degeneration, left eye, with active choroidal neovascularization: Secondary | ICD-10-CM | POA: Diagnosis not present

## 2021-01-31 DIAGNOSIS — H18423 Band keratopathy, bilateral: Secondary | ICD-10-CM | POA: Diagnosis not present

## 2021-01-31 DIAGNOSIS — H43813 Vitreous degeneration, bilateral: Secondary | ICD-10-CM | POA: Diagnosis not present

## 2021-02-09 DIAGNOSIS — J439 Emphysema, unspecified: Secondary | ICD-10-CM | POA: Diagnosis not present

## 2021-02-09 DIAGNOSIS — I4819 Other persistent atrial fibrillation: Secondary | ICD-10-CM | POA: Diagnosis not present

## 2021-02-09 DIAGNOSIS — I5042 Chronic combined systolic (congestive) and diastolic (congestive) heart failure: Secondary | ICD-10-CM | POA: Diagnosis not present

## 2021-02-09 DIAGNOSIS — D6869 Other thrombophilia: Secondary | ICD-10-CM | POA: Diagnosis not present

## 2021-02-09 LAB — BASIC METABOLIC PANEL
BUN: 28 — AB (ref 4–21)
CO2: 30 — AB (ref 13–22)
Chloride: 101 (ref 99–108)
Creatinine: 1.2 — AB (ref 0.5–1.1)
Glucose: 84
Potassium: 3.9 (ref 3.4–5.3)
Sodium: 140 (ref 137–147)

## 2021-02-09 LAB — HEPATIC FUNCTION PANEL
ALT: 17 (ref 7–35)
AST: 22 (ref 13–35)
Alkaline Phosphatase: 66 (ref 25–125)
Bilirubin, Total: 0.5

## 2021-02-09 LAB — CBC: RBC: 3.75 — AB (ref 3.87–5.11)

## 2021-02-09 LAB — CBC AND DIFFERENTIAL
HCT: 35 — AB (ref 36–46)
Hemoglobin: 11.7 — AB (ref 12.0–16.0)
Platelets: 186 (ref 150–399)
WBC: 4.5

## 2021-02-09 LAB — COMPREHENSIVE METABOLIC PANEL
Albumin: 3.7 (ref 3.5–5.0)
Calcium: 10.6 (ref 8.7–10.7)

## 2021-02-09 LAB — TSH: TSH: 1.51 (ref 0.41–5.90)

## 2021-02-14 DIAGNOSIS — Z974 Presence of external hearing-aid: Secondary | ICD-10-CM | POA: Diagnosis not present

## 2021-02-14 DIAGNOSIS — H938X1 Other specified disorders of right ear: Secondary | ICD-10-CM | POA: Diagnosis not present

## 2021-02-14 DIAGNOSIS — J343 Hypertrophy of nasal turbinates: Secondary | ICD-10-CM | POA: Diagnosis not present

## 2021-02-14 DIAGNOSIS — H903 Sensorineural hearing loss, bilateral: Secondary | ICD-10-CM | POA: Diagnosis not present

## 2021-02-15 ENCOUNTER — Encounter: Payer: Self-pay | Admitting: Internal Medicine

## 2021-02-15 ENCOUNTER — Encounter: Payer: Self-pay | Admitting: *Deleted

## 2021-02-20 ENCOUNTER — Other Ambulatory Visit: Payer: Self-pay

## 2021-02-20 ENCOUNTER — Encounter: Payer: Self-pay | Admitting: Adult Health

## 2021-02-20 ENCOUNTER — Non-Acute Institutional Stay: Payer: Medicare Other | Admitting: Adult Health

## 2021-02-20 VITALS — BP 124/62 | HR 72 | Temp 97.9°F | Ht 60.0 in | Wt 132.8 lb

## 2021-02-20 DIAGNOSIS — I872 Venous insufficiency (chronic) (peripheral): Secondary | ICD-10-CM

## 2021-02-20 DIAGNOSIS — I4821 Permanent atrial fibrillation: Secondary | ICD-10-CM | POA: Diagnosis not present

## 2021-02-20 DIAGNOSIS — D509 Iron deficiency anemia, unspecified: Secondary | ICD-10-CM

## 2021-02-20 DIAGNOSIS — K52832 Lymphocytic colitis: Secondary | ICD-10-CM

## 2021-02-20 DIAGNOSIS — N83201 Unspecified ovarian cyst, right side: Secondary | ICD-10-CM | POA: Diagnosis not present

## 2021-02-20 DIAGNOSIS — I5042 Chronic combined systolic (congestive) and diastolic (congestive) heart failure: Secondary | ICD-10-CM | POA: Diagnosis not present

## 2021-02-20 DIAGNOSIS — N1832 Chronic kidney disease, stage 3b: Secondary | ICD-10-CM | POA: Diagnosis not present

## 2021-02-20 DIAGNOSIS — I10 Essential (primary) hypertension: Secondary | ICD-10-CM | POA: Diagnosis not present

## 2021-02-20 DIAGNOSIS — K5901 Slow transit constipation: Secondary | ICD-10-CM | POA: Diagnosis not present

## 2021-02-20 DIAGNOSIS — H353221 Exudative age-related macular degeneration, left eye, with active choroidal neovascularization: Secondary | ICD-10-CM | POA: Diagnosis not present

## 2021-02-20 MED ORDER — SENNA-DOCUSATE SODIUM 8.6-50 MG PO TABS: 2.0000 | ORAL_TABLET | Freq: Every evening | ORAL | 3 refills | Status: DC | PRN
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
## 2021-02-20 NOTE — Patient Instructions (Signed)
You are doing great, keep up the good work!  Monitor your abd for fluid collection, shortness of breath, constipation, swelling (ascites)  Stop calcium due to elevation of Ca level  F/U 4 months  Try senokot s 2 tabs qhs as needed

## 2021-03-07 DIAGNOSIS — H35372 Puckering of macula, left eye: Secondary | ICD-10-CM | POA: Diagnosis not present

## 2021-03-07 DIAGNOSIS — Z961 Presence of intraocular lens: Secondary | ICD-10-CM | POA: Diagnosis not present

## 2021-03-07 DIAGNOSIS — H353221 Exudative age-related macular degeneration, left eye, with active choroidal neovascularization: Secondary | ICD-10-CM | POA: Diagnosis not present

## 2021-03-07 DIAGNOSIS — H18423 Band keratopathy, bilateral: Secondary | ICD-10-CM | POA: Diagnosis not present

## 2021-03-07 DIAGNOSIS — H43813 Vitreous degeneration, bilateral: Secondary | ICD-10-CM | POA: Diagnosis not present

## 2021-03-07 DIAGNOSIS — H353114 Nonexudative age-related macular degeneration, right eye, advanced atrophic with subfoveal involvement: Secondary | ICD-10-CM | POA: Diagnosis not present

## 2021-03-21 DIAGNOSIS — Z23 Encounter for immunization: Secondary | ICD-10-CM | POA: Diagnosis not present

## 2021-03-30 ENCOUNTER — Ambulatory Visit: Payer: Medicare Other | Admitting: Cardiology

## 2021-03-31 ENCOUNTER — Other Ambulatory Visit: Payer: Self-pay

## 2021-03-31 ENCOUNTER — Other Ambulatory Visit: Payer: Self-pay | Admitting: *Deleted

## 2021-03-31 MED ORDER — SYNTHROID 75 MCG PO TABS: 75.0000 ug | ORAL_TABLET | Freq: Every day | ORAL | 1 refills | Status: DC

## 2021-03-31 MED ORDER — CARVEDILOL 6.25 MG PO TABS: ORAL_TABLET | ORAL | 1 refills | Status: DC

## 2021-03-31 MED ORDER — POTASSIUM CHLORIDE CRYS ER 20 MEQ PO TBCR: EXTENDED_RELEASE_TABLET | ORAL | 0 refills | Status: DC

## 2021-03-31 NOTE — Telephone Encounter (Signed)
Received refill request from pharmacy
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
## 2021-04-06 ENCOUNTER — Ambulatory Visit (INDEPENDENT_AMBULATORY_CARE_PROVIDER_SITE_OTHER): Payer: Medicare Other | Admitting: Cardiology

## 2021-04-06 ENCOUNTER — Encounter: Payer: Self-pay | Admitting: Cardiology

## 2021-04-06 ENCOUNTER — Other Ambulatory Visit: Payer: Self-pay

## 2021-04-06 VITALS — BP 140/80 | HR 64 | Ht 66.0 in | Wt 131.4 lb

## 2021-04-06 DIAGNOSIS — I739 Peripheral vascular disease, unspecified: Secondary | ICD-10-CM

## 2021-04-06 DIAGNOSIS — Z79899 Other long term (current) drug therapy: Secondary | ICD-10-CM

## 2021-04-06 DIAGNOSIS — I10 Essential (primary) hypertension: Secondary | ICD-10-CM | POA: Diagnosis not present

## 2021-04-06 DIAGNOSIS — I2510 Atherosclerotic heart disease of native coronary artery without angina pectoris: Secondary | ICD-10-CM | POA: Diagnosis not present

## 2021-04-06 DIAGNOSIS — Z5181 Encounter for therapeutic drug level monitoring: Secondary | ICD-10-CM

## 2021-04-06 DIAGNOSIS — I5042 Chronic combined systolic (congestive) and diastolic (congestive) heart failure: Secondary | ICD-10-CM | POA: Diagnosis not present

## 2021-04-06 DIAGNOSIS — I4819 Other persistent atrial fibrillation: Secondary | ICD-10-CM | POA: Diagnosis not present

## 2021-04-06 DIAGNOSIS — Z0189 Encounter for other specified special examinations: Secondary | ICD-10-CM

## 2021-04-06 NOTE — Patient Instructions (Signed)
Medication Instructions:   Your physician recommends that you continue on your current medications as directed. Please refer to the Current Medication list given to you today.  *If you need a refill on your cardiac medications before your next appointment, please call your pharmacy*   Lab Work:  [facility] LEVEL AND BMET  If you have labs (blood work) drawn today and your tests are completely normal, you will receive your results only by: [place] (if you have MyChart) OR A paper copy in the mail If you have any lab test that is abnormal or we need to change your treatment, we will call you to review the results.    Follow-Up:  3-4 MONTHS WITH AN EXTENDER IN THE OFFICE
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
## 2021-04-07 LAB — BASIC METABOLIC PANEL
BUN/Creatinine Ratio: 22 (ref 12–28)
BUN: 23 mg/dL (ref 10–36)
CO2: 27 mmol/L (ref 20–29)
Calcium: 11.2 mg/dL — ABNORMAL HIGH (ref 8.7–10.3)
Chloride: 102 mmol/L (ref 96–106)
Creatinine, Ser: 1.04 mg/dL — ABNORMAL HIGH (ref 0.57–1.00)
Glucose: 77 mg/dL (ref 65–99)
Potassium: 3.8 mmol/L (ref 3.5–5.2)
Sodium: 145 mmol/L — ABNORMAL HIGH (ref 134–144)
eGFR: 50 mL/min/{1.73_m2} — ABNORMAL LOW (ref >59)

## 2021-04-07 LAB — DIGOXIN LEVEL: Digoxin, Serum: 0.7 ng/mL (ref 0.5–0.9)

## 2021-04-10 ENCOUNTER — Ambulatory Visit (INDEPENDENT_AMBULATORY_CARE_PROVIDER_SITE_OTHER): Payer: Medicare Other | Admitting: Family Medicine

## 2021-04-10 ENCOUNTER — Other Ambulatory Visit: Payer: Self-pay

## 2021-04-10 ENCOUNTER — Encounter: Payer: Self-pay | Admitting: Family Medicine

## 2021-04-10 ENCOUNTER — Encounter: Payer: Medicare Other | Admitting: Family

## 2021-04-10 VITALS — BP 150/56 | Ht 66.0 in | Wt 130.0 lb

## 2021-04-10 DIAGNOSIS — M25561 Pain in right knee: Secondary | ICD-10-CM | POA: Diagnosis present

## 2021-04-10 DIAGNOSIS — I2510 Atherosclerotic heart disease of native coronary artery without angina pectoris: Secondary | ICD-10-CM | POA: Diagnosis not present

## 2021-04-10 DIAGNOSIS — G8929 Other chronic pain: Secondary | ICD-10-CM | POA: Diagnosis not present

## 2021-04-10 NOTE — Patient Instructions (Signed)
Your knee exam is reassuring. The best way to treat this knee issue is to focus on strengthening of your quad muscles - add these exercises to your daily routine. Continue with yoga. Walk with a walker every time while you work on building the strength back in your leg. Follow up with me in 6 weeks for reevaluation.

## 2021-04-10 NOTE — Progress Notes (Signed)
  Debra Brooks - 87 y.o. female MRN [id number]  Date of birth: [birth year]  SUBJECTIVE:    CC: Right knee weakness  Geneen presents today with complaints of R knee weakness. She reports that it will intermittently go out on her and she will need to catch herself on her walker. The knee is not painful at baseline but she does experience some pain when it goes out on her. She reports that her R knee is swollen and seems to knock in on her other knee. She does not take anything for the pain. She does yoga 3 times a week. She has never had surgery on the knee. She does wear hoes for compression. Of note, she was seen by Dr. Oneida Alar around 10 years ago for the same issue and was given an injection and cane to use.    Objective:  VS: BP:(!) 150/56  HR: bpm  TEMP: ( )  RESP:   HT:5\' 6"  (167.6 cm)   WT:130 lb (59 kg)  BMI:20.99 PHYSICAL EXAM:  Well appearing female in NAD.   Right Knee: Mild effusion without erythema.  Mildly TTP at the medial joint line.  Gait with moderate genu valgus of right knee.  FROM in flexion and extension.  Negative Mcmurray's.  Negative ant/post drawers, valgus and varus stress.   ASSESSMENT & PLAN:  Right Knee Weakness: She most likely has OA with shifting of her femur on her tibia which has made her unstable while walking. She should continue to use the walker to ambulate. We are giving her quad strengthening exercises to do at home that her yoga instructor will help with.   Marcelino Duster, MS4

## 2021-04-24 DIAGNOSIS — B078 Other viral warts: Secondary | ICD-10-CM | POA: Diagnosis not present

## 2021-04-24 DIAGNOSIS — L821 Other seborrheic keratosis: Secondary | ICD-10-CM | POA: Diagnosis not present

## 2021-04-24 DIAGNOSIS — D1801 Hemangioma of skin and subcutaneous tissue: Secondary | ICD-10-CM | POA: Diagnosis not present

## 2021-04-24 DIAGNOSIS — L720 Epidermal cyst: Secondary | ICD-10-CM | POA: Diagnosis not present

## 2021-04-24 DIAGNOSIS — L570 Actinic keratosis: Secondary | ICD-10-CM | POA: Diagnosis not present

## 2021-05-03 ENCOUNTER — Other Ambulatory Visit: Payer: Self-pay

## 2021-05-03 MED ORDER — DIGOXIN 125 MCG PO TABS: 62.5000 ug | ORAL_TABLET | Freq: Every day | ORAL | 3 refills | Status: AC

## 2021-05-24 ENCOUNTER — Other Ambulatory Visit: Payer: Self-pay

## 2021-05-24 ENCOUNTER — Ambulatory Visit (INDEPENDENT_AMBULATORY_CARE_PROVIDER_SITE_OTHER): Payer: Medicare Other | Admitting: Family Medicine

## 2021-05-24 VITALS — BP 129/80 | Ht 66.0 in | Wt 130.0 lb

## 2021-05-24 DIAGNOSIS — M25551 Pain in right hip: Secondary | ICD-10-CM

## 2021-05-24 DIAGNOSIS — G8929 Other chronic pain: Secondary | ICD-10-CM | POA: Diagnosis not present

## 2021-05-24 DIAGNOSIS — M25561 Pain in right knee: Secondary | ICD-10-CM | POA: Diagnosis not present

## 2021-05-24 DIAGNOSIS — M25571 Pain in right ankle and joints of right foot: Secondary | ICD-10-CM

## 2021-05-24 DIAGNOSIS — I2510 Atherosclerotic heart disease of native coronary artery without angina pectoris: Secondary | ICD-10-CM | POA: Diagnosis not present

## 2021-05-24 NOTE — Assessment & Plan Note (Addendum)
Improved and essentially resolved.  She will continue with her exercises as needed.  Overall she is very happy with her progress and doing very well from a functional standpoint.  Can follow-up here as needed as well.

## 2021-05-24 NOTE — Assessment & Plan Note (Signed)
She has some tenderness directly over the greater trochanter, otherwise has no tenderness to palpation of her hip.  Her pain is most likely related to this, however she opts to not perform any treatment modalities or exercises as she states it is not very painful.  She will follow-up here as needed if she decides that the pain worsens or she desires treatment.

## 2021-05-24 NOTE — Assessment & Plan Note (Signed)
Most likely related to strain of her peroneal tendons.  Ultimately, she is not severely bothered by this and does not want to proceed with any further exercises.  She will return as needed if the pain worsens.

## 2021-05-24 NOTE — Progress Notes (Signed)
Debra Brooks is a 87 y.o. female who presents to [facility] today for the following:  Right Knee Pain F/U Last seen on 04/10/2021 At that time she had some genu valgus and medial subluxation due to osteoarthritis that had good ligamentous and meniscus testing She was advised to use her walker while ambulating and performing quad strengthening exercises on her own She reports that her pain is significantly improved since her last visit and she has been doing her exercises almost daily She states that now she is having some discomfort in the right lateral aspect of her hip without radiation into her groin, buttocks, or posterior leg She states that the pain comes and goes, mostly occurs when she is standing at the sink and feels that sometimes her hip wants to give way She also states that it is sometimes tender to the touch, but does not cause her to have difficulty sleeping She is also having some soreness in her right lateral ankle, posterior to her lateral malleolus She denies any radiation of the pain States that it is not very painful, is only been occurring for the last 2 weeks, and is only a slight soreness She is not bothered by any of this pain that she is having enough to take any medication   PMH reviewed.  ROS as above. Medications reviewed.  Exam:  BP 129/80   Ht '5\' 6"'$  (1.676 m)   Wt 130 lb (59 kg)   BMI 20.98 kg/m  Gen: Well NAD MSK:  Right knee: - Inspection: Genu valgus of right knee.  Bilateral effusions with 1+ pitting edema BLE.  Scattered varicosities b/l.  No erythema or bruising b/l. Skin intact - Palpation: no TTP b/l - ROM: full active ROM with flexion and extension in knee and hip b/l - Strength: 5/5 strength b/l - Neuro/vasc: NV intact distally b/l - Special Tests: - LIGAMENTS: negative anterior and posterior drawer, negative Lachman's, no MCL or LCL laxity  -- MENISCUS: negative McMurray's, negative Thessaly  -- PF JOINT: nml patellar mobility  bilaterally.  negative patellar grind, negative patellar apprehension  Hips: normal ROM, she has some TTP along right greater trochanter  Right Ankle: - Inspection: 1+ pitting edema BLE, scattered varicosities - Palpation: No TTP at MT heads, no TTP at base of 5th MT, no TTP over cuboid, no tenderness over navicular prominence, no TTP over lateral or medial malleolus. She has mild TTP overlying peroneal tendons on right - Strength: Normal strength with dorsiflexion, plantarflexion, inversion, and eversion of foot; flexion and extension of toes b/l - ROM: Full ROM b/l - Neuro/vasc: NV intact distally bilaterally - Special Tests: Negative anterior drawer, normal inversion test.  Negative syndesmotic compression.  No results found.   Assessment and Plan: 1) Chronic pain of right knee Improved and essentially resolved.  She will continue with her exercises as needed.  Overall she is very happy with her progress and doing very well from a functional standpoint.  Can follow-up here as needed as well.  Acute right ankle pain Most likely related to strain of her peroneal tendons.  Ultimately, she is not severely bothered by this and does not want to proceed with any further exercises.  She will return as needed if the pain worsens.  Acute right hip pain She has some tenderness directly over the greater trochanter, otherwise has no tenderness to palpation of her hip.  Her pain is most likely related to this, however she opts to not perform any treatment  modalities or exercises as she states it is not very painful.  She will follow-up here as needed if she decides that the pain worsens or she desires treatment.   Arizona Constable, D.O.  PGY-4 [facility]  05/24/2021 5:04 PM

## 2021-05-25 ENCOUNTER — Encounter: Payer: Self-pay | Admitting: Family Medicine

## 2021-05-30 ENCOUNTER — Other Ambulatory Visit: Payer: Self-pay | Admitting: Orthopedic Surgery

## 2021-05-30 DIAGNOSIS — G609 Hereditary and idiopathic neuropathy, unspecified: Secondary | ICD-10-CM

## 2021-05-30 NOTE — Telephone Encounter (Signed)
RX last filled on 12/09/2020 # 30 with 5, no treatment agreement on file (notation made on 06/19/21 appointment)

## 2021-05-31 ENCOUNTER — Other Ambulatory Visit: Payer: Self-pay | Admitting: *Deleted

## 2021-05-31 MED ORDER — POTASSIUM CHLORIDE CRYS ER 20 MEQ PO TBCR: EXTENDED_RELEASE_TABLET | ORAL | 11 refills | Status: DC

## 2021-06-12 ENCOUNTER — Other Ambulatory Visit: Payer: Self-pay | Admitting: *Deleted

## 2021-06-12 DIAGNOSIS — F419 Anxiety disorder, unspecified: Secondary | ICD-10-CM

## 2021-06-12 MED ORDER — ALPRAZOLAM 0.25 MG PO TABS: ORAL_TABLET | ORAL | 1 refills | Status: DC

## 2021-06-12 NOTE — Telephone Encounter (Signed)
Pharmacy requested refill.  Pended Rx and sent to [facility] for approval.  Epid LR: 12/16/20

## 2021-06-13 DIAGNOSIS — E876 Hypokalemia: Secondary | ICD-10-CM | POA: Diagnosis not present

## 2021-06-13 DIAGNOSIS — I5042 Chronic combined systolic (congestive) and diastolic (congestive) heart failure: Secondary | ICD-10-CM | POA: Diagnosis not present

## 2021-06-13 DIAGNOSIS — D649 Anemia, unspecified: Secondary | ICD-10-CM | POA: Diagnosis not present

## 2021-06-13 DIAGNOSIS — I10 Essential (primary) hypertension: Secondary | ICD-10-CM | POA: Diagnosis not present

## 2021-06-13 DIAGNOSIS — E785 Hyperlipidemia, unspecified: Secondary | ICD-10-CM | POA: Diagnosis not present

## 2021-06-13 DIAGNOSIS — I5020 Unspecified systolic (congestive) heart failure: Secondary | ICD-10-CM | POA: Diagnosis not present

## 2021-06-13 DIAGNOSIS — D6869 Other thrombophilia: Secondary | ICD-10-CM | POA: Diagnosis not present

## 2021-06-13 LAB — CBC: RBC: 3.96 (ref 3.87–5.11)

## 2021-06-13 LAB — LIPID PANEL
Cholesterol: 189 (ref 0–200)
HDL: 71 — AB (ref 35–70)
LDL Cholesterol: 101
LDl/HDL Ratio: 2.7
Triglycerides: 84 (ref 40–160)

## 2021-06-13 LAB — CBC AND DIFFERENTIAL
HCT: 37 (ref 36–46)
Hemoglobin: 12.4 (ref 12.0–16.0)
Platelets: 189 (ref 150–399)
WBC: 5.1

## 2021-06-13 LAB — BASIC METABOLIC PANEL
BUN: 24 — AB (ref 4–21)
CO2: 25 — AB (ref 13–22)
Chloride: 101 (ref 99–108)
Creatinine: 1.1 (ref 0.5–1.1)
Glucose: 95
Potassium: 4 (ref 3.4–5.3)
Sodium: 140 (ref 137–147)

## 2021-06-13 LAB — COMPREHENSIVE METABOLIC PANEL: Calcium: 10.2 (ref 8.7–10.7)

## 2021-06-19 ENCOUNTER — Non-Acute Institutional Stay: Payer: Medicare Other | Admitting: Adult Health

## 2021-06-19 ENCOUNTER — Other Ambulatory Visit: Payer: Self-pay

## 2021-06-19 ENCOUNTER — Encounter: Payer: Self-pay | Admitting: Adult Health

## 2021-06-19 VITALS — BP 132/84 | HR 71 | Temp 97.5°F | Ht 66.0 in | Wt 131.0 lb

## 2021-06-19 DIAGNOSIS — R059 Cough, unspecified: Secondary | ICD-10-CM | POA: Diagnosis not present

## 2021-06-19 DIAGNOSIS — D509 Iron deficiency anemia, unspecified: Secondary | ICD-10-CM

## 2021-06-19 DIAGNOSIS — N83201 Unspecified ovarian cyst, right side: Secondary | ICD-10-CM

## 2021-06-19 DIAGNOSIS — K5901 Slow transit constipation: Secondary | ICD-10-CM | POA: Diagnosis not present

## 2021-06-19 DIAGNOSIS — I872 Venous insufficiency (chronic) (peripheral): Secondary | ICD-10-CM

## 2021-06-19 DIAGNOSIS — I4821 Permanent atrial fibrillation: Secondary | ICD-10-CM

## 2021-06-19 DIAGNOSIS — N1832 Chronic kidney disease, stage 3b: Secondary | ICD-10-CM | POA: Diagnosis not present

## 2021-06-19 DIAGNOSIS — I10 Essential (primary) hypertension: Secondary | ICD-10-CM | POA: Diagnosis not present

## 2021-06-19 DIAGNOSIS — I2510 Atherosclerotic heart disease of native coronary artery without angina pectoris: Secondary | ICD-10-CM

## 2021-06-19 DIAGNOSIS — E039 Hypothyroidism, unspecified: Secondary | ICD-10-CM

## 2021-06-19 DIAGNOSIS — H353221 Exudative age-related macular degeneration, left eye, with active choroidal neovascularization: Secondary | ICD-10-CM | POA: Diagnosis not present

## 2021-06-19 DIAGNOSIS — I5042 Chronic combined systolic (congestive) and diastolic (congestive) heart failure: Secondary | ICD-10-CM
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
## 2021-06-26 DIAGNOSIS — R1900 Intra-abdominal and pelvic swelling, mass and lump, unspecified site: Secondary | ICD-10-CM | POA: Diagnosis not present

## 2021-06-26 DIAGNOSIS — R198 Other specified symptoms and signs involving the digestive system and abdomen: Secondary | ICD-10-CM | POA: Diagnosis not present

## 2021-06-26 DIAGNOSIS — K52832 Lymphocytic colitis: Secondary | ICD-10-CM | POA: Diagnosis not present

## 2021-06-26 DIAGNOSIS — R634 Abnormal weight loss: Secondary | ICD-10-CM | POA: Diagnosis not present

## 2021-06-26 DIAGNOSIS — K8020 Calculus of gallbladder without cholecystitis without obstruction: Secondary | ICD-10-CM | POA: Diagnosis not present

## 2021-06-26 DIAGNOSIS — Z85038 Personal history of other malignant neoplasm of large intestine: Secondary | ICD-10-CM | POA: Diagnosis not present

## 2021-06-26 DIAGNOSIS — R935 Abnormal findings on diagnostic imaging of other abdominal regions, including retroperitoneum: Secondary | ICD-10-CM | POA: Diagnosis not present

## 2021-06-26 DIAGNOSIS — K5901 Slow transit constipation: Secondary | ICD-10-CM | POA: Diagnosis not present

## 2021-06-26 DIAGNOSIS — Z7901 Long term (current) use of anticoagulants: Secondary | ICD-10-CM | POA: Diagnosis not present

## 2021-07-06 ENCOUNTER — Other Ambulatory Visit: Payer: Self-pay | Admitting: *Deleted

## 2021-07-06 MED ORDER — BUPROPION HCL ER (XL) 150 MG PO TB24: ORAL_TABLET | ORAL | 5 refills | Status: DC

## 2021-07-06 NOTE — Telephone Encounter (Signed)
Hoyle Sauer with [facility] called requesting refill.

## 2021-07-13 DIAGNOSIS — Z23 Encounter for immunization: Secondary | ICD-10-CM | POA: Diagnosis not present

## 2021-07-17 ENCOUNTER — Other Ambulatory Visit: Payer: Self-pay

## 2021-07-17 ENCOUNTER — Encounter (HOSPITAL_BASED_OUTPATIENT_CLINIC_OR_DEPARTMENT_OTHER): Payer: Self-pay | Admitting: Obstetrics and Gynecology

## 2021-07-17 ENCOUNTER — Emergency Department (HOSPITAL_BASED_OUTPATIENT_CLINIC_OR_DEPARTMENT_OTHER)
Admission: EM | Admit: 2021-07-17 | Discharge: 2021-07-17 | Disposition: A | Discharge status: Home / Self Care | Payer: Medicare Other | Attending: Emergency Medicine | Admitting: Emergency Medicine

## 2021-07-17 ENCOUNTER — Emergency Department (HOSPITAL_BASED_OUTPATIENT_CLINIC_OR_DEPARTMENT_OTHER): Payer: Medicare Other | Admitting: Radiology

## 2021-07-17 DIAGNOSIS — Z87891 Personal history of nicotine dependence: Secondary | ICD-10-CM | POA: Diagnosis not present

## 2021-07-17 DIAGNOSIS — W208XXA Other cause of strike by thrown, projected or falling object, initial encounter: Secondary | ICD-10-CM | POA: Diagnosis not present

## 2021-07-17 DIAGNOSIS — N183 Chronic kidney disease, stage 3 unspecified: Secondary | ICD-10-CM | POA: Insufficient documentation

## 2021-07-17 DIAGNOSIS — S99922A Unspecified injury of left foot, initial encounter: Secondary | ICD-10-CM | POA: Diagnosis present

## 2021-07-17 DIAGNOSIS — M79672 Pain in left foot: Secondary | ICD-10-CM

## 2021-07-17 DIAGNOSIS — I5042 Chronic combined systolic (congestive) and diastolic (congestive) heart failure: Secondary | ICD-10-CM | POA: Diagnosis not present

## 2021-07-17 DIAGNOSIS — I130 Hypertensive heart and chronic kidney disease with heart failure and stage 1 through stage 4 chronic kidney disease, or unspecified chronic kidney disease: Secondary | ICD-10-CM | POA: Diagnosis not present

## 2021-07-17 DIAGNOSIS — S9032XA Contusion of left foot, initial encounter: Secondary | ICD-10-CM | POA: Diagnosis not present

## 2021-07-17 DIAGNOSIS — E039 Hypothyroidism, unspecified: Secondary | ICD-10-CM | POA: Diagnosis not present

## 2021-07-17 DIAGNOSIS — Z79899 Other long term (current) drug therapy: Secondary | ICD-10-CM | POA: Diagnosis not present

## 2021-07-17 DIAGNOSIS — Z7901 Long term (current) use of anticoagulants: Secondary | ICD-10-CM | POA: Diagnosis not present

## 2021-07-17 DIAGNOSIS — I2510 Atherosclerotic heart disease of native coronary artery without angina pectoris: Secondary | ICD-10-CM | POA: Diagnosis not present

## 2021-07-17 DIAGNOSIS — Z8542 Personal history of malignant neoplasm of other parts of uterus: Secondary | ICD-10-CM | POA: Insufficient documentation

## 2021-07-17 DIAGNOSIS — Z853 Personal history of malignant neoplasm of breast: Secondary | ICD-10-CM | POA: Insufficient documentation

## 2021-07-17 DIAGNOSIS — M7989 Other specified soft tissue disorders: Secondary | ICD-10-CM | POA: Diagnosis not present

## 2021-07-17 NOTE — ED Triage Notes (Signed)
Patient reports she dropped an ipad on her foot x1 week ago and noticed today the swelling had gotten worse. Patient states she is on blood thinners.

## 2021-07-17 NOTE — ED Notes (Signed)
Pt ambulated to restroom with some difficulty and pain noted.
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
## 2021-07-17 NOTE — Discharge Instructions (Addendum)
You were seen in the ER today for left foot pain and swelling.  Your x-rays of your foot and ankle show no evidence of fracture. I think your symptoms are likely due to increased swelling with the Eliquis. You can take over the counter medicines like tylenol for pain, or use topical gels.   Continue to monitor your symptoms and return to the ED for new or worsening pain in your foot, difficulty walking, numbness or tingling.

## 2021-07-17 NOTE — ED Notes (Signed)
Pt discharged home after verbalizing understanding of discharge instructions; nad noted.  Pt called [facility], who will send driver to pick pt up soon.

## 2021-07-26 ENCOUNTER — Ambulatory Visit (INDEPENDENT_AMBULATORY_CARE_PROVIDER_SITE_OTHER): Payer: Medicare Other | Admitting: Physician Assistant

## 2021-07-26 ENCOUNTER — Encounter: Payer: Self-pay | Admitting: Physician Assistant

## 2021-07-26 ENCOUNTER — Other Ambulatory Visit: Payer: Self-pay

## 2021-07-26 VITALS — BP 138/72 | HR 82 | Ht 66.0 in | Wt 131.0 lb

## 2021-07-26 DIAGNOSIS — I2510 Atherosclerotic heart disease of native coronary artery without angina pectoris: Secondary | ICD-10-CM

## 2021-07-26 DIAGNOSIS — Z66 Do not resuscitate: Secondary | ICD-10-CM

## 2021-07-26 DIAGNOSIS — I10 Essential (primary) hypertension: Secondary | ICD-10-CM | POA: Diagnosis not present

## 2021-07-26 DIAGNOSIS — I739 Peripheral vascular disease, unspecified: Secondary | ICD-10-CM

## 2021-07-26 DIAGNOSIS — E785 Hyperlipidemia, unspecified: Secondary | ICD-10-CM | POA: Diagnosis not present

## 2021-07-26 DIAGNOSIS — Z01810 Encounter for preprocedural cardiovascular examination: Secondary | ICD-10-CM

## 2021-07-26 DIAGNOSIS — I5020 Unspecified systolic (congestive) heart failure: Secondary | ICD-10-CM | POA: Diagnosis not present

## 2021-07-26 DIAGNOSIS — I4819 Other persistent atrial fibrillation: Secondary | ICD-10-CM | POA: Diagnosis not present
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
## 2021-07-26 NOTE — Patient Instructions (Signed)
Medication Instructions:   Your physician recommends that you continue on your current medications as directed. Please refer to the Current Medication list given to you today.  *If you need a refill on your cardiac medications before your next appointment, please call your pharmacy*   Lab Work:  -NONE  If you have labs (blood work) drawn today and your tests are completely normal, you will receive your results only by: [place] (if you have MyChart) OR A paper copy in the mail If you have any lab test that is abnormal or we need to change your treatment, we will call you to review the results.  Testing/Procedures:  - NONE  Follow-Up: At [facility], you and your health needs are our priority.  As part of our continuing mission to provide you with exceptional heart care, we have created designated Provider Care Teams.  These Care Teams include your primary Cardiologist (physician) and Advanced Practice Providers (APPs -  Physician Assistants and Nurse Practitioners) who all work together to provide you with the care you need, when you need it.  We recommend signing up for the patient portal called "MyChart".  Sign up information is provided on this After Visit Summary.  MyChart is used to connect with patients for Virtual Visits (Telemedicine).  Patients are able to view lab/test results, encounter notes, upcoming appointments, etc.  Non-urgent messages can be sent to your provider as well.   To learn more about what you can do with MyChart, go to [email or web address].    Your next appointment:   6 month(s)  The format for your next appointment:   In Person  Provider:   You may see Dr. Johney Frame or one of the following Advanced Practice Providers on your designated Care Team:   Richardson Dopp, [place]  Other Instructions

## 2021-07-28 ENCOUNTER — Other Ambulatory Visit: Payer: Self-pay | Admitting: Cardiology

## 2021-07-28 NOTE — Telephone Encounter (Signed)
Eliquis 5mg  refill request received. Patient is 87 years old, weight-59.4kg on 07/26/2021, Crea-1.1 on 06/13/2021, Diagnosis-Afib, and last seen by Richardson Dopp on 07/26/2021. Per dosing criteria pt is on incorrect dose, however, according to 07/26/21 OV note with Scott: "Technically, she should be on low-dose Apixaban.  However, given her significant stroke risk and her weight being close to 60 kg, I have recommended that we continue apixaban 5 mg twice daily for now.  If she continues to show a trend with her weight going down (i.e. 58 kg or lower), we will need to decrease her apixaban to 2.5 mg twice daily." Refill sent.

## 2021-09-01 DIAGNOSIS — L821 Other seborrheic keratosis: Secondary | ICD-10-CM | POA: Diagnosis not present

## 2021-09-01 DIAGNOSIS — L570 Actinic keratosis: Secondary | ICD-10-CM | POA: Diagnosis not present

## 2021-09-12 DIAGNOSIS — H18423 Band keratopathy, bilateral: Secondary | ICD-10-CM | POA: Diagnosis not present

## 2021-09-12 DIAGNOSIS — H43813 Vitreous degeneration, bilateral: Secondary | ICD-10-CM | POA: Diagnosis not present

## 2021-09-12 DIAGNOSIS — Z961 Presence of intraocular lens: Secondary | ICD-10-CM | POA: Diagnosis not present

## 2021-09-12 DIAGNOSIS — H35372 Puckering of macula, left eye: Secondary | ICD-10-CM | POA: Diagnosis not present

## 2021-09-12 DIAGNOSIS — H353221 Exudative age-related macular degeneration, left eye, with active choroidal neovascularization: Secondary | ICD-10-CM | POA: Diagnosis not present

## 2021-09-12 DIAGNOSIS — H353114 Nonexudative age-related macular degeneration, right eye, advanced atrophic with subfoveal involvement: Secondary | ICD-10-CM | POA: Diagnosis not present

## 2021-09-27 ENCOUNTER — Other Ambulatory Visit: Payer: Self-pay | Admitting: Adult Health

## 2021-09-29 ENCOUNTER — Other Ambulatory Visit: Payer: Self-pay | Admitting: *Deleted

## 2021-09-29 MED ORDER — NITROGLYCERIN 0.4 MG SL SUBL: SUBLINGUAL_TABLET | SUBLINGUAL | 0 refills | Status: AC

## 2021-09-29 NOTE — Telephone Encounter (Signed)
Pharmacy requested refill

## 2021-10-12 ENCOUNTER — Telehealth: Payer: Self-pay | Admitting: *Deleted

## 2021-10-12 NOTE — Telephone Encounter (Signed)
I s/w the dental office and confirmed procedure.     Pre-operative Risk Assessment    Patient Name: Debra Brooks  DOB: 04/30/1925 MRN: [id number]     Request for Surgical Clearance    Procedure:  Dental Extraction - Amount of Teeth to be Pulled:  1 TOOTH EXTRACTION ALONG WITH BONE GRAFT REPAIR AFTER EXTRACTION  Date of Surgery:  Clearance TBD                                 Surgeon:  Chelsea Aus, DDS Surgeon's Group or Practice Name:  [facility] Phone number:  [phone] Fax number:  [phone]   Type of Clearance Requested:   - Medical  - Pharmacy:  Hold Apixaban (Eliquis)     Type of Anesthesia:  Local    Additional requests/questions:    Jiles Prows   10/12/2021, 12:30 PM

## 2021-10-12 NOTE — Telephone Encounter (Signed)
Will route to pharm then pt will need call (not just tooth extraction, also bone graft repair listed). Last OV 06/2021 with Richardson Dopp. Has valvular disease but no history of valvular repair so do not see SBE ppx indicated.

## 2021-10-13 NOTE — Telephone Encounter (Signed)
Unfortunately, the patient already had procedure on 12/13.  She says the dentist office tried to reach out to [place] without success.  Fortunately, Richardson Dopp has made some recommendation during the previous office visit in September and dentist office follow his instructions instead.  I will remove this cardiac clearance request from the pool.

## 2021-10-13 NOTE — Telephone Encounter (Signed)
Patient with diagnosis of atrial fibrillation on Eliquis for anticoagulation.    Procedure: 1 extraction with bone graft repair Date of procedure: TBD   CHA2DS2-VASc Score = 6   This indicates a 9.7% annual risk of stroke. The patient's score is based upon: CHF History: 1 HTN History: 1 Diabetes History: 0 Stroke History: 0 Vascular Disease History: 1 Age Score: 2 Gender Score: 1  CrCl 29 Platelet count 189  Patient does not require pre-op antibiotics for dental procedure.  Per office protocol, patient can hold Eliquis for 2 days prior to procedure.   Patient will not need bridging with Lovenox (enoxaparin) around procedure.

## 2021-10-16 ENCOUNTER — Encounter: Payer: Medicare Other | Admitting: Adult Health

## 2021-10-18 DIAGNOSIS — D692 Other nonthrombocytopenic purpura: Secondary | ICD-10-CM | POA: Diagnosis not present

## 2021-10-18 DIAGNOSIS — L814 Other melanin hyperpigmentation: Secondary | ICD-10-CM | POA: Diagnosis not present

## 2021-10-18 DIAGNOSIS — L821 Other seborrheic keratosis: Secondary | ICD-10-CM | POA: Diagnosis not present

## 2021-10-26 ENCOUNTER — Ambulatory Visit (INDEPENDENT_AMBULATORY_CARE_PROVIDER_SITE_OTHER): Payer: Medicare Other | Admitting: Family

## 2021-10-26 ENCOUNTER — Ambulatory Visit (HOSPITAL_BASED_OUTPATIENT_CLINIC_OR_DEPARTMENT_OTHER)
Admission: RE | Admit: 2021-10-26 | Discharge: 2021-10-26 | Disposition: A | Discharge status: Home / Self Care | Payer: Medicare Other | Source: Ambulatory Visit | Attending: Family | Admitting: Family

## 2021-10-26 ENCOUNTER — Encounter: Payer: Self-pay | Admitting: Family

## 2021-10-26 ENCOUNTER — Other Ambulatory Visit: Payer: Self-pay

## 2021-10-26 VITALS — BP 100/80 | HR 83 | Temp 97.7°F | Resp 16 | Ht 66.0 in | Wt 128.8 lb

## 2021-10-26 DIAGNOSIS — R198 Other specified symptoms and signs involving the digestive system and abdomen: Secondary | ICD-10-CM | POA: Diagnosis not present

## 2021-10-26 DIAGNOSIS — I2510 Atherosclerotic heart disease of native coronary artery without angina pectoris: Secondary | ICD-10-CM

## 2021-10-26 DIAGNOSIS — K831 Obstruction of bile duct: Secondary | ICD-10-CM | POA: Diagnosis not present

## 2021-10-26 DIAGNOSIS — R17 Unspecified jaundice: Secondary | ICD-10-CM

## 2021-10-26 MED ORDER — FUROSEMIDE 40 MG PO TABS: 40.0000 mg | ORAL_TABLET | Freq: Every day | ORAL | 2 refills | Status: DC

## 2021-10-26 NOTE — Patient Instructions (Signed)
Please get Abdominal ultrasound at [facility] center
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
## 2021-10-27 ENCOUNTER — Encounter (HOSPITAL_BASED_OUTPATIENT_CLINIC_OR_DEPARTMENT_OTHER): Payer: Self-pay | Admitting: Emergency Medicine

## 2021-10-27 ENCOUNTER — Telehealth: Payer: Self-pay

## 2021-10-27 ENCOUNTER — Telehealth: Payer: Self-pay | Admitting: *Deleted

## 2021-10-27 ENCOUNTER — Observation Stay (HOSPITAL_COMMUNITY): Payer: Medicare Other

## 2021-10-27 ENCOUNTER — Inpatient Hospital Stay (HOSPITAL_BASED_OUTPATIENT_CLINIC_OR_DEPARTMENT_OTHER)
Admission: EM | Admit: 2021-10-27 | Discharge: 2021-11-04 | DRG: 444 | Disposition: A | Discharge status: Skilled Nursing Facility | Payer: Medicare Other | Source: Skilled Nursing Facility | Attending: Family Medicine | Admitting: Family Medicine

## 2021-10-27 DIAGNOSIS — Z9221 Personal history of antineoplastic chemotherapy: Secondary | ICD-10-CM

## 2021-10-27 DIAGNOSIS — I5042 Chronic combined systolic (congestive) and diastolic (congestive) heart failure: Secondary | ICD-10-CM

## 2021-10-27 DIAGNOSIS — E872 Acidosis, unspecified: Secondary | ICD-10-CM | POA: Diagnosis not present

## 2021-10-27 DIAGNOSIS — E162 Hypoglycemia, unspecified: Secondary | ICD-10-CM | POA: Diagnosis not present

## 2021-10-27 DIAGNOSIS — J439 Emphysema, unspecified: Secondary | ICD-10-CM

## 2021-10-27 DIAGNOSIS — Z888 Allergy status to other drugs, medicaments and biological substances status: Secondary | ICD-10-CM

## 2021-10-27 DIAGNOSIS — I700 Atherosclerosis of aorta: Secondary | ICD-10-CM | POA: Diagnosis not present

## 2021-10-27 DIAGNOSIS — Z7901 Long term (current) use of anticoagulants: Secondary | ICD-10-CM | POA: Diagnosis not present

## 2021-10-27 DIAGNOSIS — K831 Obstruction of bile duct: Secondary | ICD-10-CM | POA: Diagnosis not present

## 2021-10-27 DIAGNOSIS — E039 Hypothyroidism, unspecified: Secondary | ICD-10-CM

## 2021-10-27 DIAGNOSIS — K859 Acute pancreatitis without necrosis or infection, unspecified: Secondary | ICD-10-CM | POA: Diagnosis not present

## 2021-10-27 DIAGNOSIS — E875 Hyperkalemia: Secondary | ICD-10-CM | POA: Diagnosis not present

## 2021-10-27 DIAGNOSIS — Z66 Do not resuscitate: Secondary | ICD-10-CM | POA: Diagnosis not present

## 2021-10-27 DIAGNOSIS — I739 Peripheral vascular disease, unspecified: Secondary | ICD-10-CM | POA: Diagnosis present

## 2021-10-27 DIAGNOSIS — I2510 Atherosclerotic heart disease of native coronary artery without angina pectoris: Secondary | ICD-10-CM | POA: Diagnosis not present

## 2021-10-27 DIAGNOSIS — I482 Chronic atrial fibrillation, unspecified: Secondary | ICD-10-CM

## 2021-10-27 DIAGNOSIS — N1832 Chronic kidney disease, stage 3b: Secondary | ICD-10-CM | POA: Diagnosis not present

## 2021-10-27 DIAGNOSIS — K8689 Other specified diseases of pancreas: Secondary | ICD-10-CM | POA: Diagnosis not present

## 2021-10-27 DIAGNOSIS — N838 Other noninflammatory disorders of ovary, fallopian tube and broad ligament: Secondary | ICD-10-CM

## 2021-10-27 DIAGNOSIS — E785 Hyperlipidemia, unspecified: Secondary | ICD-10-CM | POA: Diagnosis not present

## 2021-10-27 DIAGNOSIS — F32A Depression, unspecified: Secondary | ICD-10-CM | POA: Diagnosis not present

## 2021-10-27 DIAGNOSIS — F419 Anxiety disorder, unspecified: Secondary | ICD-10-CM

## 2021-10-27 DIAGNOSIS — M810 Age-related osteoporosis without current pathological fracture: Secondary | ICD-10-CM | POA: Diagnosis present

## 2021-10-27 DIAGNOSIS — R17 Unspecified jaundice: Secondary | ICD-10-CM | POA: Diagnosis not present

## 2021-10-27 DIAGNOSIS — C259 Malignant neoplasm of pancreas, unspecified: Secondary | ICD-10-CM | POA: Diagnosis present

## 2021-10-27 DIAGNOSIS — Z515 Encounter for palliative care: Secondary | ICD-10-CM | POA: Diagnosis not present

## 2021-10-27 DIAGNOSIS — L299 Pruritus, unspecified: Secondary | ICD-10-CM | POA: Diagnosis present

## 2021-10-27 DIAGNOSIS — Z881 Allergy status to other antibiotic agents status: Secondary | ICD-10-CM

## 2021-10-27 DIAGNOSIS — Z85038 Personal history of other malignant neoplasm of large intestine: Secondary | ICD-10-CM

## 2021-10-27 DIAGNOSIS — K269 Duodenal ulcer, unspecified as acute or chronic, without hemorrhage or perforation: Secondary | ICD-10-CM | POA: Diagnosis present

## 2021-10-27 DIAGNOSIS — Z79899 Other long term (current) drug therapy: Secondary | ICD-10-CM

## 2021-10-27 DIAGNOSIS — I10 Essential (primary) hypertension: Secondary | ICD-10-CM | POA: Diagnosis not present

## 2021-10-27 DIAGNOSIS — N1831 Chronic kidney disease, stage 3a: Secondary | ICD-10-CM | POA: Diagnosis present

## 2021-10-27 DIAGNOSIS — I130 Hypertensive heart and chronic kidney disease with heart failure and stage 1 through stage 4 chronic kidney disease, or unspecified chronic kidney disease: Secondary | ICD-10-CM | POA: Diagnosis not present

## 2021-10-27 DIAGNOSIS — I4821 Permanent atrial fibrillation: Secondary | ICD-10-CM | POA: Diagnosis present

## 2021-10-27 DIAGNOSIS — Z853 Personal history of malignant neoplasm of breast: Secondary | ICD-10-CM | POA: Diagnosis not present

## 2021-10-27 DIAGNOSIS — Z20822 Contact with and (suspected) exposure to covid-19: Secondary | ICD-10-CM | POA: Diagnosis not present

## 2021-10-27 DIAGNOSIS — I272 Pulmonary hypertension, unspecified: Secondary | ICD-10-CM | POA: Diagnosis not present

## 2021-10-27 DIAGNOSIS — K838 Other specified diseases of biliary tract: Secondary | ICD-10-CM | POA: Diagnosis not present

## 2021-10-27 DIAGNOSIS — R54 Age-related physical debility: Secondary | ICD-10-CM | POA: Diagnosis present

## 2021-10-27 DIAGNOSIS — N183 Chronic kidney disease, stage 3 unspecified: Secondary | ICD-10-CM | POA: Diagnosis present

## 2021-10-27 DIAGNOSIS — E876 Hypokalemia: Secondary | ICD-10-CM | POA: Diagnosis not present

## 2021-10-27 DIAGNOSIS — D378 Neoplasm of uncertain behavior of other specified digestive organs: Secondary | ICD-10-CM | POA: Diagnosis not present

## 2021-10-27 DIAGNOSIS — Z91041 Radiographic dye allergy status: Secondary | ICD-10-CM

## 2021-10-27 DIAGNOSIS — N179 Acute kidney failure, unspecified: Secondary | ICD-10-CM | POA: Diagnosis not present

## 2021-10-27 DIAGNOSIS — M419 Scoliosis, unspecified: Secondary | ICD-10-CM | POA: Diagnosis not present

## 2021-10-27 DIAGNOSIS — Z8249 Family history of ischemic heart disease and other diseases of the circulatory system: Secondary | ICD-10-CM

## 2021-10-27 DIAGNOSIS — N83201 Unspecified ovarian cyst, right side: Secondary | ICD-10-CM | POA: Diagnosis present

## 2021-10-27 DIAGNOSIS — R7989 Other specified abnormal findings of blood chemistry: Secondary | ICD-10-CM | POA: Diagnosis present

## 2021-10-27 DIAGNOSIS — Z79890 Hormone replacement therapy (postmenopausal): Secondary | ICD-10-CM

## 2021-10-27 DIAGNOSIS — Z87891 Personal history of nicotine dependence: Secondary | ICD-10-CM

## 2021-10-27 DIAGNOSIS — Z9049 Acquired absence of other specified parts of digestive tract: Secondary | ICD-10-CM

## 2021-10-27 LAB — HEPATITIS PANEL, ACUTE
Hep A IgM: NONREACTIVE
Hep B C IgM: NONREACTIVE
Hepatitis B Surface Ag: NONREACTIVE
Hepatitis C Ab: NONREACTIVE
SIGNAL TO CUT-OFF: 0.03 (ref <1.00)

## 2021-10-27 LAB — CBC WITH DIFFERENTIAL/PLATELET
Abs Immature Granulocytes: 0.02 10*3/uL (ref 0.00–0.07)
Absolute Monocytes: 640 cells/uL (ref 200–950)
Basophils Absolute: 92 cells/uL (ref 0–200)
Basophils Absolute: 0.1 10*3/uL (ref 0.0–0.1)
Basophils Relative: 1.4 %
Basophils Relative: 1 %
Eosinophils Absolute: 73 cells/uL (ref 15–500)
Eosinophils Absolute: 0.1 10*3/uL (ref 0.0–0.5)
Eosinophils Relative: 1.1 %
Eosinophils Relative: 1 %
HCT: 38.3 % (ref 35.0–45.0)
HCT: 37.6 % (ref 36.0–46.0)
Hemoglobin: 13 g/dL (ref 11.7–15.5)
Hemoglobin: 12.5 g/dL (ref 12.0–15.0)
Immature Granulocytes: 0 %
Lymphocytes Relative: 10 %
Lymphs Abs: 838 cells/uL — ABNORMAL LOW (ref 850–3900)
Lymphs Abs: 0.6 10*3/uL — ABNORMAL LOW (ref 0.7–4.0)
MCH: 31.9 pg (ref 27.0–33.0)
MCH: 31.2 pg (ref 26.0–34.0)
MCHC: 33.9 g/dL (ref 32.0–36.0)
MCHC: 33.2 g/dL (ref 30.0–36.0)
MCV: 93.9 fL (ref 80.0–100.0)
MCV: 93.8 fL (ref 80.0–100.0)
MPV: 11.1 fL (ref 7.5–12.5)
Monocytes Absolute: 0.4 10*3/uL (ref 0.1–1.0)
Monocytes Relative: 9.7 %
Monocytes Relative: 8 %
Neutro Abs: 4957 cells/uL (ref 1500–7800)
Neutro Abs: 4.7 10*3/uL (ref 1.7–7.7)
Neutrophils Relative %: 75.1 %
Neutrophils Relative %: 80 %
Platelets: 242 10*3/uL (ref 140–400)
Platelets: 227 10*3/uL (ref 150–400)
RBC: 4.08 10*6/uL (ref 3.80–5.10)
RBC: 4.01 MIL/uL (ref 3.87–5.11)
RDW: 13.4 % (ref 11.0–15.0)
RDW: 15.9 % — ABNORMAL HIGH (ref 11.5–15.5)
Total Lymphocyte: 12.7 %
WBC: 6.6 10*3/uL (ref 3.8–10.8)
WBC: 5.9 10*3/uL (ref 4.0–10.5)
nRBC: 0 % (ref 0.0–0.2)

## 2021-10-27 LAB — COMPREHENSIVE METABOLIC PANEL
ALT: 105 U/L — ABNORMAL HIGH (ref 0–44)
AST: 73 U/L — ABNORMAL HIGH (ref 15–41)
Albumin: 3.7 g/dL (ref 3.5–5.0)
Alkaline Phosphatase: 171 U/L — ABNORMAL HIGH (ref 38–126)
Anion gap: 11 (ref 5–15)
BUN: 22 mg/dL (ref 8–23)
CO2: 24 mmol/L (ref 22–32)
Calcium: 10.1 mg/dL (ref 8.9–10.3)
Chloride: 102 mmol/L (ref 98–111)
Creatinine, Ser: 1.17 mg/dL — ABNORMAL HIGH (ref 0.44–1.00)
GFR, Estimated: 43 mL/min — ABNORMAL LOW (ref >60)
Glucose, Bld: 159 mg/dL — ABNORMAL HIGH (ref 70–99)
Potassium: 3.1 mmol/L — ABNORMAL LOW (ref 3.5–5.1)
Sodium: 137 mmol/L (ref 135–145)
Total Bilirubin: 9.3 mg/dL — ABNORMAL HIGH (ref 0.3–1.2)
Total Protein: 6.1 g/dL — ABNORMAL LOW (ref 6.5–8.1)

## 2021-10-27 LAB — MAGNESIUM: Magnesium: 1.9 mg/dL (ref 1.7–2.4)

## 2021-10-27 LAB — COMPLETE METABOLIC PANEL WITH GFR
AG Ratio: 2 (calc) (ref 1.0–2.5)
ALT: 114 U/L — ABNORMAL HIGH (ref 6–29)
AST: 76 U/L — ABNORMAL HIGH (ref 10–35)
Albumin: 3.8 g/dL (ref 3.6–5.1)
Alkaline phosphatase (APISO): 204 U/L — ABNORMAL HIGH (ref 37–153)
BUN/Creatinine Ratio: 18 (calc) (ref 6–22)
BUN: 20 mg/dL (ref 7–25)
CO2: 27 mmol/L (ref 20–32)
Calcium: 10.6 mg/dL — ABNORMAL HIGH (ref 8.6–10.4)
Chloride: 100 mmol/L (ref 98–110)
Creat: 1.12 mg/dL — ABNORMAL HIGH (ref 0.60–0.95)
Globulin: 1.9 g/dL (calc) (ref 1.9–3.7)
Glucose, Bld: 112 mg/dL — ABNORMAL HIGH (ref 65–99)
Potassium: 3.3 mmol/L — ABNORMAL LOW (ref 3.5–5.3)
Sodium: 139 mmol/L (ref 135–146)
Total Bilirubin: 9.9 mg/dL — ABNORMAL HIGH (ref 0.2–1.2)
Total Protein: 5.7 g/dL — ABNORMAL LOW (ref 6.1–8.1)
eGFR: 46 mL/min/{1.73_m2} — ABNORMAL LOW (ref >=60)

## 2021-10-27 LAB — URINALYSIS, ROUTINE W REFLEX MICROSCOPIC
Glucose, UA: NEGATIVE mg/dL
Hgb urine dipstick: NEGATIVE
Ketones, ur: NEGATIVE mg/dL
Leukocytes,Ua: NEGATIVE
Nitrite: NEGATIVE
Protein, ur: NEGATIVE mg/dL
Specific Gravity, Urine: 1.008 (ref 1.005–1.030)
pH: 6 (ref 5.0–8.0)

## 2021-10-27 LAB — CK: Total CK: 60 U/L (ref 38–234)

## 2021-10-27 LAB — RESP PANEL BY RT-PCR (FLU A&B, COVID) ARPGX2
Influenza A by PCR: NEGATIVE
Influenza B by PCR: NEGATIVE
SARS Coronavirus 2 by RT PCR: NEGATIVE

## 2021-10-27 LAB — LIPASE, BLOOD: Lipase: 32 U/L (ref 11–51)

## 2021-10-27 LAB — GAMMA GT: GGT: 93 U/L — ABNORMAL HIGH (ref 7–50)

## 2021-10-27 LAB — PHOSPHORUS: Phosphorus: 3.2 mg/dL (ref 2.5–4.6)

## 2021-10-27 LAB — DIGOXIN LEVEL: Digoxin Level: 0.4 ng/mL — ABNORMAL LOW (ref 0.8–2.0)

## 2021-10-27 MED ORDER — CARVEDILOL 3.125 MG PO TABS
3.1250 mg | ORAL_TABLET | Freq: Two times a day (BID) | ORAL | Status: DC
  Administered 2021-10-28 – 2021-10-29 (×3): 3.125 mg via ORAL
  Filled 2021-10-27 (×3): qty 1

## 2021-10-27 MED ORDER — FUROSEMIDE 40 MG PO TABS: 40.0000 mg | ORAL_TABLET | Freq: Every day | ORAL | Status: DC

## 2021-10-27 MED ORDER — LEVOTHYROXINE SODIUM 50 MCG PO TABS
75.0000 ug | ORAL_TABLET | Freq: Every day | ORAL | Status: DC
  Administered 2021-10-28 – 2021-10-29 (×2): 75 ug via ORAL
  Filled 2021-10-27 (×3): qty 1

## 2021-10-27 MED ORDER — POTASSIUM CHLORIDE CRYS ER 20 MEQ PO TBCR
20.0000 meq | EXTENDED_RELEASE_TABLET | Freq: Once | ORAL | Status: AC
  Administered 2021-10-27: 23:00:00 20 meq via ORAL
  Filled 2021-10-27: qty 1

## 2021-10-27 MED ORDER — LEVOTHYROXINE SODIUM 50 MCG PO TABS: 75.0000 ug | ORAL_TABLET | Freq: Every day | ORAL | Status: DC

## 2021-10-27 MED ORDER — HYDROXYZINE HCL 10 MG PO TABS
10.0000 mg | ORAL_TABLET | Freq: Three times a day (TID) | ORAL | Status: DC | PRN
  Administered 2021-10-27: 23:00:00 10 mg via ORAL
  Filled 2021-10-27 (×2): qty 1

## 2021-10-27 MED ORDER — ALBUTEROL SULFATE (2.5 MG/3ML) 0.083% IN NEBU: 2.5000 mg | INHALATION_SOLUTION | RESPIRATORY_TRACT | Status: DC | PRN

## 2021-10-27 MED ORDER — SODIUM CHLORIDE 0.9% FLUSH
3.0000 mL | Freq: Two times a day (BID) | INTRAVENOUS | Status: DC
  Administered 2021-10-27 – 2021-10-28 (×3): 3 mL via INTRAVENOUS

## 2021-10-27 MED ORDER — SODIUM CHLORIDE 0.9 % IV SOLN: 250.0000 mL | INTRAVENOUS | Status: DC | PRN

## 2021-10-27 MED ORDER — GADOBUTROL 1 MMOL/ML IV SOLN
6.0000 mL | Freq: Once | INTRAVENOUS | Status: AC | PRN
  Administered 2021-10-27: 22:00:00 6 mL via INTRAVENOUS

## 2021-10-27 MED ORDER — ACETAMINOPHEN 650 MG RE SUPP: 650.0000 mg | Freq: Four times a day (QID) | RECTAL | Status: DC | PRN

## 2021-10-27 MED ORDER — ACETAMINOPHEN 325 MG PO TABS: 650.0000 mg | ORAL_TABLET | Freq: Four times a day (QID) | ORAL | Status: DC | PRN

## 2021-10-27 MED ORDER — HYDROCODONE-ACETAMINOPHEN 5-325 MG PO TABS: 1.0000 | ORAL_TABLET | ORAL | Status: DC | PRN

## 2021-10-27 MED ORDER — ALPRAZOLAM 0.25 MG PO TABS
0.2500 mg | ORAL_TABLET | Freq: Every evening | ORAL | Status: DC | PRN
  Administered 2021-10-28: 0.25 mg via ORAL
  Filled 2021-10-27: qty 1

## 2021-10-27 MED ORDER — SODIUM CHLORIDE 0.9% FLUSH: 3.0000 mL | INTRAVENOUS | Status: DC | PRN

## 2021-10-27 MED ORDER — MELATONIN 5 MG PO TABS
10.0000 mg | ORAL_TABLET | Freq: Every day | ORAL | Status: DC
  Administered 2021-10-27 – 2021-10-28 (×2): 10 mg via ORAL
  Filled 2021-10-27 (×2): qty 2

## 2021-10-27 NOTE — Assessment & Plan Note (Signed)
Albuterol prn

## 2021-10-27 NOTE — Assessment & Plan Note (Signed)
-  chronic avoid nephrotoxic medications such as NSAIDs, Vanco Zosyn combo,  avoid hypotension, continue to follow renal function  
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
## 2021-10-27 NOTE — Telephone Encounter (Signed)
Patient's son requests we call [facility] to let them know the patient is on the way.   Called [facility] (ED reception), first attempt just ringing then busy signal.  Second attempt, just ringing then busy signal.

## 2021-10-27 NOTE — ED Notes (Signed)
Attempt x1 to give report  

## 2021-10-27 NOTE — ED Triage Notes (Addendum)
Pt arrives pov from [facility] with c/o Jaundice and itchy skin x 4 days. Was treated yesterday by pcp for symptoms. Pt denies pain, endorses decreased po intake

## 2021-10-27 NOTE — Subjective & Objective (Signed)
Jaundice and pruritus.  For past few days.  The other day was told by her hairdresser that her skin was turning yellow. Patient does not drink alcohol no nausea no vomiting no fevers or chills. Yesterday had an ultrasound done that showed intra and extrahepatic biliary dilatation suspicious for obstructing process. Patient was notified and asked to come to emergency department she presented to [facility]. GI was consulted and recommended admitting patient for MRCP will see in a.m.

## 2021-10-27 NOTE — Assessment & Plan Note (Signed)
restart bb Not aspirin  Not on statin

## 2021-10-27 NOTE — Assessment & Plan Note (Signed)
Chronic-stable.

## 2021-10-27 NOTE — Assessment & Plan Note (Signed)
-   Check TSH continue home medications at current dose ° °

## 2021-10-27 NOTE — Telephone Encounter (Signed)
Radiologist Assistant called and states that provider needs to review patient Ultasound results as soon as possible. Message routed to Marlowe Sax, NP who ordered ultrasound for patient seen yesterday 10/26/2021 in office.

## 2021-10-27 NOTE — Telephone Encounter (Signed)
Noted  

## 2021-10-27 NOTE — Telephone Encounter (Signed)
Patient called and stated that she was seen yesterday and forgot to ask for something to help with her itching all over.   Stated that it wakes her up at night.   Has not tried anything OTC for it.   Patient is requesting something to be called into [facility] to help with the itching.   Please Advise.

## 2021-10-27 NOTE — Assessment & Plan Note (Signed)
Family and pt chose not to intervene

## 2021-10-27 NOTE — Progress Notes (Signed)
Spoke w/ Soijett Blue PA-C at [facility] ED and reviewed patient chart approx 2:15 PM 10/27/21  Pt stable, PA has spoken w/ GI who recommends admit for MRCP Will accept - admission orders are in for [facility] results pending, will see patient if she arrives before 6:00 PM today or will hand off case to oncoming coverage if she arrives at/after 6:00 PM.

## 2021-10-27 NOTE — Telephone Encounter (Signed)
Patient's lab results and abdominal ultrasound was called and discussed with patient's son.Recommended evaluation in ED due to elevated liver enzymes.
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
## 2021-10-27 NOTE — Telephone Encounter (Signed)
Patient talked to Marlowe Sax, NP. Refer to Ultrasound note.

## 2021-10-27 NOTE — Assessment & Plan Note (Addendum)
.   plan for MRCP.  GI aware Family in the past wanted to avoid aggressive interventions but would like to proceed with MRCP CT scan of what is possible prognosis.  Pending results of  MRCP for further discussion could be made regarding treatment

## 2021-10-28 ENCOUNTER — Encounter (HOSPITAL_COMMUNITY): Payer: Self-pay | Admitting: Internal Medicine

## 2021-10-28 DIAGNOSIS — K831 Obstruction of bile duct: Secondary | ICD-10-CM | POA: Diagnosis not present

## 2021-10-28 LAB — CBC WITH DIFFERENTIAL/PLATELET
Abs Immature Granulocytes: 0.02 10*3/uL (ref 0.00–0.07)
Basophils Absolute: 0.1 10*3/uL (ref 0.0–0.1)
Basophils Relative: 1 %
Eosinophils Absolute: 0.1 10*3/uL (ref 0.0–0.5)
Eosinophils Relative: 2 %
HCT: 32.8 % — ABNORMAL LOW (ref 36.0–46.0)
Hemoglobin: 11 g/dL — ABNORMAL LOW (ref 12.0–15.0)
Immature Granulocytes: 0 %
Lymphocytes Relative: 13 %
Lymphs Abs: 0.8 10*3/uL (ref 0.7–4.0)
MCH: 32.3 pg (ref 26.0–34.0)
MCHC: 33.5 g/dL (ref 30.0–36.0)
MCV: 96.2 fL (ref 80.0–100.0)
Monocytes Absolute: 0.7 10*3/uL (ref 0.1–1.0)
Monocytes Relative: 12 %
Neutro Abs: 4.2 10*3/uL (ref 1.7–7.7)
Neutrophils Relative %: 72 %
Platelets: 201 10*3/uL (ref 150–400)
RBC: 3.41 MIL/uL — ABNORMAL LOW (ref 3.87–5.11)
RDW: 16.1 % — ABNORMAL HIGH (ref 11.5–15.5)
WBC: 5.8 10*3/uL (ref 4.0–10.5)
nRBC: 0 % (ref 0.0–0.2)

## 2021-10-28 LAB — COMPREHENSIVE METABOLIC PANEL
ALT: 89 U/L — ABNORMAL HIGH (ref 0–44)
AST: 69 U/L — ABNORMAL HIGH (ref 15–41)
Albumin: 2.9 g/dL — ABNORMAL LOW (ref 3.5–5.0)
Alkaline Phosphatase: 157 U/L — ABNORMAL HIGH (ref 38–126)
Anion gap: 9 (ref 5–15)
BUN: 23 mg/dL (ref 8–23)
CO2: 24 mmol/L (ref 22–32)
Calcium: 9.4 mg/dL (ref 8.9–10.3)
Chloride: 103 mmol/L (ref 98–111)
Creatinine, Ser: 0.76 mg/dL (ref 0.44–1.00)
GFR, Estimated: >60 mL/min (ref >60)
Glucose, Bld: 93 mg/dL (ref 70–99)
Potassium: 2.5 mmol/L — CL (ref 3.5–5.1)
Sodium: 136 mmol/L (ref 135–145)
Total Bilirubin: 8.3 mg/dL — ABNORMAL HIGH (ref 0.3–1.2)
Total Protein: 5.4 g/dL — ABNORMAL LOW (ref 6.5–8.1)

## 2021-10-28 LAB — BASIC METABOLIC PANEL
Anion gap: 9 (ref 5–15)
BUN: 24 mg/dL — ABNORMAL HIGH (ref 8–23)
CO2: 26 mmol/L (ref 22–32)
Calcium: 9.6 mg/dL (ref 8.9–10.3)
Chloride: 100 mmol/L (ref 98–111)
Creatinine, Ser: 0.96 mg/dL (ref 0.44–1.00)
GFR, Estimated: 55 mL/min — ABNORMAL LOW (ref >60)
Glucose, Bld: 119 mg/dL — ABNORMAL HIGH (ref 70–99)
Potassium: 3 mmol/L — ABNORMAL LOW (ref 3.5–5.1)
Sodium: 135 mmol/L (ref 135–145)

## 2021-10-28 LAB — PHOSPHORUS: Phosphorus: 3.7 mg/dL (ref 2.5–4.6)

## 2021-10-28 LAB — TSH: TSH: 0.666 u[IU]/mL (ref 0.350–4.500)

## 2021-10-28 LAB — MAGNESIUM: Magnesium: 1.9 mg/dL (ref 1.7–2.4)

## 2021-10-28 MED ORDER — DIPHENHYDRAMINE HCL 25 MG PO CAPS
25.0000 mg | ORAL_CAPSULE | Freq: Four times a day (QID) | ORAL | Status: DC | PRN
  Administered 2021-10-28 – 2021-10-29 (×2): 25 mg via ORAL
  Filled 2021-10-28 (×2): qty 1

## 2021-10-28 MED ORDER — ALBUTEROL SULFATE (2.5 MG/3ML) 0.083% IN NEBU: 2.5000 mg | INHALATION_SOLUTION | Freq: Four times a day (QID) | RESPIRATORY_TRACT | Status: DC | PRN

## 2021-10-28 MED ORDER — DIGOXIN 0.0625 MG HALF TABLET
0.0625 mg | ORAL_TABLET | Freq: Every day | ORAL | Status: DC
  Administered 2021-10-28 – 2021-10-29 (×2): 0.0625 mg via ORAL
  Filled 2021-10-28 (×2): qty 1

## 2021-10-28 MED ORDER — POTASSIUM CHLORIDE 10 MEQ/100ML IV SOLN
10.0000 meq | INTRAVENOUS | Status: AC
  Administered 2021-10-28 (×3): 10 meq via INTRAVENOUS
  Filled 2021-10-28 (×3): qty 100

## 2021-10-28 MED ORDER — ALUM & MAG HYDROXIDE-SIMETH 200-200-20 MG/5ML PO SUSP
30.0000 mL | ORAL | Status: DC | PRN
  Administered 2021-10-28: 22:00:00 30 mL via ORAL
  Filled 2021-10-28: qty 30

## 2021-10-28 MED ORDER — DIPHENHYDRAMINE HCL 50 MG/ML IJ SOLN: 12.5000 mg | Freq: Four times a day (QID) | INTRAMUSCULAR | Status: DC | PRN

## 2021-10-28 MED ORDER — PREGABALIN 50 MG PO CAPS
50.0000 mg | ORAL_CAPSULE | Freq: Once | ORAL | Status: AC
  Administered 2021-10-28: 50 mg via ORAL
  Filled 2021-10-28: qty 1

## 2021-10-28 MED ORDER — POTASSIUM CHLORIDE IN NACL 20-0.9 MEQ/L-% IV SOLN
INTRAVENOUS | Status: DC
  Filled 2021-10-28 (×2): qty 1000

## 2021-10-28 MED ORDER — POTASSIUM CHLORIDE CRYS ER 20 MEQ PO TBCR
40.0000 meq | EXTENDED_RELEASE_TABLET | Freq: Three times a day (TID) | ORAL | Status: DC
  Administered 2021-10-28 – 2021-10-29 (×4): 40 meq via ORAL
  Filled 2021-10-28 (×4): qty 2

## 2021-10-28 NOTE — Evaluation (Signed)
Occupational Therapy Evaluation Patient Details Name: Debra Brooks MRN: [id number] DOB: 08/02/1925 Today's Date: 10/28/2021   History of Present Illness Debra Brooks is a 87 y.o. female with medical history significant of anemia, anxiety, atrial flutter status post ablation, status post breast cancer in 2004, CAD, chronic combined systolic diastolic heart failure, CKD stage III, chronic venous insufficiency, HLD, HTN GERD, right bundle branch block, hypothyroidism. Presented with jaundice and found to have biliary obstruction.   Clinical Impression   Debra Brooks is a pleasant 87 year old woman admitted with above medical history. On evaluation she is independent with bed mobility, modified independent with in room ambulation with rollator and independent with ADLs. Patient has no complaints of fatigue or weakness. Patient presents at her baseline and can return to independent living facility.      Recommendations for follow up therapy are one component of a multi-disciplinary discharge planning process, led by the attending physician.  Recommendations may be updated based on patient status, additional functional criteria and insurance authorization.   Follow Up Recommendations  No OT follow up    Assistance Recommended at Discharge PRN  Functional Status Assessment  Patient has not had a recent decline in their functional status  Equipment Recommendations  None recommended by OT    Recommendations for Other Services       Precautions / Restrictions Precautions Precautions: None Restrictions Weight Bearing Restrictions: No      Mobility Bed Mobility Overal bed mobility: Independent                  Transfers Overall transfer level: Modified independent Equipment used: Rollator (4 wheels)                      Balance Overall balance assessment: Mild deficits observed, not formally tested                                          ADL either performed or assessed with clinical judgement   ADL Overall ADL's : At baseline                                             Vision Baseline Vision/History: 6 Macular Degeneration Ability to See in Adequate Light: 3 Highly impaired Patient Visual Report: No change from baseline       Perception     Praxis      Pertinent Vitals/Pain Pain Assessment: No/denies pain     Hand Dominance Right   Extremity/Trunk Assessment Upper Extremity Assessment Upper Extremity Assessment: Overall WFL for tasks assessed   Lower Extremity Assessment Lower Extremity Assessment: Overall WFL for tasks assessed   Cervical / Trunk Assessment Cervical / Trunk Assessment: Normal   Communication Communication Communication: No difficulties   Cognition Arousal/Alertness: Awake/alert Behavior During Therapy: WFL for tasks assessed/performed Overall Cognitive Status: Within Functional Limits for tasks assessed                                       General Comments       Exercises     Shoulder Instructions      Home Living Family/patient expects to be discharged  to:: Other (Comment)                                 Additional Comments: [facility]      Prior Functioning/Environment Prior Level of Function : Independent/Modified Independent             Mobility Comments: uses rollator ADLs Comments: Independent - likes someone to be close when she bathes. Has caregivers to assist with driving, cooking and cleaning.        OT Problem List:        OT Treatment/Interventions:      OT Goals(Current goals can be found in the care plan section) Acute Rehab OT Goals OT Goal Formulation: All assessment and education complete, DC therapy  OT Frequency:     Barriers to D/C:            Co-evaluation              AM-PAC OT "6 Clicks" Daily Activity     Outcome Measure Help from another person  eating meals?: None Help from another person taking care of personal grooming?: None Help from another person toileting, which includes using toliet, bedpan, or urinal?: None Help from another person bathing (including washing, rinsing, drying)?: None Help from another person to put on and taking off regular upper body clothing?: None Help from another person to put on and taking off regular lower body clothing?: None 6 Click Score: 24   End of Session Equipment Utilized During Treatment: Rollator (4 wheels) Nurse Communication:  (okay to see)  Activity Tolerance: Patient tolerated treatment well Patient left: in bed;with call bell/phone within reach  OT Visit Diagnosis: Muscle weakness (generalized) (M62.81)                Time: 1610-9604 OT Time Calculation (min): 20 min Charges:  OT General Charges $OT Visit: 1 Visit OT Evaluation $OT Eval Low Complexity: 1 Low  Debra Brooks, OTR/L [place]  Office [phone] Pager: [phone]   Debra Brooks 10/28/2021, 8:44 AM

## 2021-10-28 NOTE — TOC CM/SW Note (Signed)
°  Transition of Care [facility]) Screening Note   Patient Details  Name: Debra Brooks Date of Birth: 10-09-1925   Transition of Care [facility]) CM/SW Contact:    Ross Ludwig, LCSW Phone Number: 10/28/2021, 5:10 PM    Transition of Care Department [facility]) has reviewed patient and no TOC needs have been identified at this time. We will continue to monitor patient advancement through interdisciplinary progression rounds. If new patient transition needs arise, please place a TOC consult.

## 2021-10-28 NOTE — Progress Notes (Signed)
Debra Brooks  KZS:[id number] DOB: November 21, 1924 DOA: 10/27/2021 PCP: Royal Hawthorn, NP    Brief Narrative:  87 year old independent living resident with a history of chronic anemia, anxiety, atrial fibrillation status post ablation on Eliquis, breast cancer, CAD, chronic combined systolic and diastolic CHF, CKD stage III, chronic venous insufficiency, HTN, GERD, HLD, hypothyroidism, and RBBB who presented to the ED with jaundice for 2 to 3 days.  An outpatient ultrasound has been accomplished which noted intra and extrahepatic biliary dilatation suggestive of bile duct obstruction.  She was then informed to present to the ED for further evaluation.  The case was discussed with GI by the ED physician and they reportedly suggested an MRCP.  Consultants:  [facility]  Code Status: NO CODE BLUE  Antimicrobials:  none  DVT prophylaxis: SCDs  Subjective: Afebrile.  Vital signs stable.  Heart rate controlled at 70 bpm.  Saturation 96% on room air.  Potassium profoundly low at 2.5 this morning.  Renal function stable.  Magnesium 1.9.  Alert and conversant at the time of my visit. We had a detailed conversation regarding the findings on her MRCP, and the likelihood this represents a newly diagnosed pancreatic cancer. She and her son (at bedside) voice understanding. She is clear she does not desire any further evaluation, and that she would not be interested in any treatment even if she was a candidate. She does however wish to undergo placement of a biliary stent if GI feels it will likely improve her interim quality of life. Her son is in agreement.   Assessment & Plan:  Newly appreciated 2.3 x 2.2 by 1.9 cm Pancreatic Neck Mass  Findings most c/w pancreatic cancer - no evidence of masses/metastatic disease anywhere else on MRCP or [place] of liver   Biliary obstruction - Jaundice MRCP noted mass in neck of pancreas c/w adenocarcinoma which is leading to bile duct compression - GI to eval for  palliative stent placement   Recent Labs  Lab 10/26/21 1614 10/27/21 1202 10/28/21 0625  AST 76* 73* 69*  ALT 114* 105* 89*  ALKPHOS  --  171* 157*  BILITOT 9.9* 9.3* 8.3*  PROT 5.7* 6.1* 5.4*  ALBUMIN  --  3.7 2.9*     Severe hypokalemia Due to poor intake - supplement and follow trend   Recent Labs  Lab 10/26/21 1614 10/27/21 1202 10/28/21 0625 10/28/21 1238  K 3.3* 3.1* 2.5* 3.0*    HTN No indication for strict control in this clinical circumstance  CKD stage III Baseline creatinine 1.1 - crt stable   Chronic combined diastolic and systolic CHF EF 73-22% per TTE 2021 - no volume overload on exam   Permanent atrial fibrillation Hold Eliquis for probable biliary intervention - continue Coreg and digoxin  Hypothyroidism Continue usual Synthroid dose  CAD Continue beta-blocker  Chronic R ovarian cyst Has not desired w/u previously   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 rate 19, height 5\' 6"  (1.676 m), weight 57.2 kg, SpO2 95 %.  Intake/Output Summary (Last 24 hours) at 10/28/2021 1708 Last data filed at 10/28/2021 1521 Gross per 24 hour  Intake 480 ml  Output --  Net 480 ml   Filed Weights   10/27/21 1143  Weight: 57.2 kg  Examination: General: No acute respiratory distress - jaundiced  Lungs: Clear to auscultation bilaterally without wheezes or crackles Cardiovascular: Regular rate and rhythm without murmur gallop or rub normal S1 and S2 Abdomen: Nontender, nondistended, soft, bowel sounds positive, no rebound, no ascites, no appreciable mass Extremities: No significant cyanosis, clubbing, or edema bilateral lower extremities  CBC: Recent Labs  Lab 10/26/21 1614 10/27/21 1202  10/28/21 0625  WBC 6.6 5.9 5.8  NEUTROABS 4,957 4.7 4.2  HGB 13.0 12.5 11.0*  HCT 38.3 37.6 32.8*  MCV 93.9 93.8 96.2  PLT 242 227 267   Basic Metabolic Panel: Recent Labs  Lab 10/27/21 1202 10/27/21 2026 10/28/21 0625 10/28/21 1238  NA 137  --  136 135  K 3.1*  --  2.5* 3.0*  CL 102  --  103 100  CO2 24  --  24 26  GLUCOSE 159*  --  93 119*  BUN 22  --  23 24*  CREATININE 1.17*  --  0.76 0.96  CALCIUM 10.1  --  9.4 9.6  MG  --  1.9 1.9  --   PHOS  --  3.2 3.7  --    GFR: Estimated Creatinine Clearance: 32.4 mL/min (by C-G formula based on SCr of 0.96 mg/dL).  Liver Function Tests: Recent Labs  Lab 10/26/21 1614 10/27/21 1202 10/28/21 0625  AST 76* 73* 69*  ALT 114* 105* 89*  ALKPHOS  --  171* 157*  BILITOT 9.9* 9.3* 8.3*  PROT 5.7* 6.1* 5.4*  ALBUMIN  --  3.7 2.9*   Recent Labs  Lab 10/27/21 1243  LIPASE 32    Cardiac Enzymes: Recent Labs  Lab 10/27/21 2026  CKTOTAL 60    HbA1C: Hgb A1c MFr Bld  Date/Time Value Ref Range Status  10/31/2017 05:56 AM 5.3 4.8 - 5.6 % Final    Comment:    (NOTE) Pre diabetes:          5.7%-6.4% Diabetes:              >6.4% Glycemic control for   <7.0% adults with diabetes     Recent Results (from the past 240 hour(s))  Resp Panel by RT-PCR (Flu A&B, Covid) Nasopharyngeal Swab     Status: None   Collection Time: 10/27/21  2:42 PM   Specimen: Nasopharyngeal Swab; Nasopharyngeal(NP) swabs in vial transport medium  Result Value Ref Range Status   SARS Coronavirus 2 by RT PCR NEGATIVE NEGATIVE Final    Comment: (NOTE) SARS-CoV-2 target nucleic acids are NOT DETECTED.  The SARS-CoV-2 RNA is generally detectable in upper respiratory specimens during the acute phase of infection. The lowest concentration of SARS-CoV-2 viral copies this assay can detect is 138 copies/mL. A negative result does not preclude SARS-Cov-2 infection and should not be used as the sole basis for treatment or other patient management  decisions. A negative result may occur with  improper specimen collection/handling, submission of specimen other than nasopharyngeal swab, presence of viral mutation(s) within the areas targeted by this assay, and inadequate number of viral copies(<138 copies/mL). A negative result must be combined with clinical observations, patient history, and epidemiological information. The expected result is Negative.  Fact Sheet for Patients:  EntrepreneurPulse.com.au  Fact Sheet for Healthcare Providers:  IncredibleEmployment.be  This test is no t yet approved or cleared by the [place] FDA and  has been authorized for detection and/or diagnosis of SARS-CoV-2 by FDA under an Emergency Use Authorization (EUA). This EUA will remain  in effect (meaning  this test can be used) for the duration of the COVID-19 declaration under Section 564(b)(1) of the Act, 21 U.S.C.section 360bbb-3(b)(1), unless the authorization is terminated  or revoked sooner.       Influenza A by PCR NEGATIVE NEGATIVE Final   Influenza B by PCR NEGATIVE NEGATIVE Final    Comment: (NOTE) The Xpert Xpress SARS-CoV-2/FLU/RSV plus assay is intended as an aid in the diagnosis of influenza from Nasopharyngeal swab specimens and should not be used as a sole basis for treatment. Nasal washings and aspirates are unacceptable for Xpert Xpress SARS-CoV-2/FLU/RSV testing.  Fact Sheet for Patients: EntrepreneurPulse.com.au  Fact Sheet for Healthcare Providers: IncredibleEmployment.be  This test is not yet approved or cleared by the [place] FDA and has been authorized for detection and/or diagnosis of SARS-CoV-2 by FDA under an Emergency Use Authorization (EUA). This EUA will remain in effect (meaning this test can be used) for the duration of the COVID-19 declaration under Section 564(b)(1) of the Act, 21 U.S.C. section 360bbb-3(b)(1), unless the  authorization is terminated or revoked.  Performed at [facility], [address], [place], [place] [address]      Scheduled Meds:  carvedilol  3.125 mg Oral BID WC   digoxin  0.0625 mg Oral Daily   levothyroxine  75 mcg Oral Q0600   melatonin  10 mg Oral QHS   potassium chloride  40 mEq Oral TID   sodium chloride flush  3 mL Intravenous Q12H   Continuous Infusions:  sodium chloride       LOS: 0 days   Cherene Altes, MD [facility] Office  [phone] Pager - Text Page per Shea Evans  If 7PM-7AM, please contact night-coverage per Amion 10/28/2021, 5:08 PM
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
## 2021-10-28 NOTE — Evaluation (Signed)
Physical Therapy Evaluation Patient Details Name: ALITHIA ZAVALETA MRN: [id number] DOB: 1924/12/02 Today's Date: 10/28/2021  History of Present Illness  HAYVEN CROY is a 87 y.o. female with medical history significant of anemia, anxiety, atrial flutter status post ablation, status post breast cancer in 2004, CAD, chronic combined systolic diastolic heart failure, CKD stage III, chronic venous insufficiency, HLD, HTN GERD, right bundle branch block, hypothyroidism. 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  Clinical Impression  Pt admitted with above diagnosis. Pt currently with functional limitations due to the deficits listed below (see PT Problem List). Pt will benefit from skilled PT to increase their independence and safety with mobility to allow discharge to the venue listed below.  Pt close to baseline level. She is scheduled to have a procedure. Will follow up and she may benefit from 1-2 more sessions.      Recommendations for follow up therapy are one component of a multi-disciplinary discharge planning process, led by the attending physician.  Recommendations may be updated based on patient status, additional functional criteria and insurance authorization.  Follow Up Recommendations No PT follow up    Assistance Recommended at Discharge Intermittent Supervision/Assistance  Functional Status Assessment Patient has had a recent decline in their functional status and demonstrates the ability to make significant improvements in function in a reasonable and predictable amount of time.  Equipment Recommendations  None recommended by PT    Recommendations for Other Services       Precautions / Restrictions Precautions Precautions: None      Mobility  Bed Mobility Overal bed mobility: Independent                  Transfers Overall transfer level: Needs assistance Equipment used: Rollator (4 wheels) Transfers: Sit to/from  Stand Sit to Stand: Min guard           General transfer comment: Took her 2 attempts to stand form bed "I normally just pop up". Use of rail for getting up from toilet.    Ambulation/Gait Ambulation/Gait assistance: Min guard Gait Distance (Feet): 160 Feet Assistive device: Rollator (4 wheels) Gait Pattern/deviations: Decreased step length - right;Decreased step length - left;Decreased dorsiflexion - right;Decreased dorsiflexion - left Gait velocity: decreased     General Gait Details: Close to baseline, but not as smooth or as quick as she normally walks.  Stairs            Wheelchair Mobility    Modified Rankin (Stroke Patients Only)       Balance Overall balance assessment: Mild deficits observed, not formally tested                                           Pertinent Vitals/Pain Pain Assessment: No/denies pain    Home Living Family/patient expects to be discharged to:: Other (Comment)                   Additional Comments: [facility]    Prior Function Prior Level of Function : Independent/Modified Independent             Mobility Comments: uses rollator       Hand Dominance   Dominant Hand: Right    Extremity/Trunk Assessment   Upper Extremity Assessment Upper Extremity Assessment: Defer to OT evaluation    Lower Extremity Assessment Lower Extremity  Assessment: Overall WFL for tasks assessed;Generalized weakness    Cervical / Trunk Assessment Cervical / Trunk Assessment: Normal  Communication   Communication: No difficulties  Cognition Arousal/Alertness: Awake/alert Behavior During Therapy: WFL for tasks assessed/performed Overall Cognitive Status: Within Functional Limits for tasks assessed                                          General Comments      Exercises     Assessment/Plan    PT Assessment Patient needs continued PT services  PT Problem List Decreased  activity tolerance;Decreased mobility       PT Treatment Interventions DME instruction;Gait training;Functional mobility training;Therapeutic exercise;Therapeutic activities;Balance training    PT Goals (Current goals can be found in the Care Plan section)  Acute Rehab PT Goals Patient Stated Goal: return to her ILF PT Goal Formulation: With patient Time For Goal Achievement: 11/11/21 Potential to Achieve Goals: Good    Frequency Min 3X/week   Barriers to discharge        Co-evaluation               AM-PAC PT "6 Clicks" Mobility  Outcome Measure Help needed turning from your back to your side while in a flat bed without using bedrails?: None Help needed moving from lying on your back to sitting on the side of a flat bed without using bedrails?: None Help needed moving to and from a bed to a chair (including a wheelchair)?: A Little Help needed standing up from a chair using your arms (e.g., wheelchair or bedside chair)?: A Little Help needed to walk in hospital room?: A Little Help needed climbing 3-5 steps with a railing? : A Little 6 Click Score: 20    End of Session Equipment Utilized During Treatment: Gait belt Activity Tolerance: Patient tolerated treatment well Patient left: in chair;with call bell/phone within reach Nurse Communication: Mobility status PT Visit Diagnosis: Muscle weakness (generalized) (M62.81);Difficulty in walking, not elsewhere classified (R26.2)    Time: 7001-7494 PT Time Calculation (min) (ACUTE ONLY): 28 min   Charges:   PT Evaluation $PT Eval Moderate Complexity: 1 Mod PT Treatments $Gait Training: 8-22 mins         Grosser L. Tamala Julian, [place]  10/28/2021   Galen Manila 10/28/2021, 5:03 PM

## 2021-10-28 NOTE — Progress Notes (Signed)
PT Cancellation Note  Patient Details Name: Debra Brooks MRN: [id number] DOB: [birth year]   Cancelled Treatment:    Reason Eval/Treat Not Completed: Patient declined. Pt getting meds from nurse.  Pt stated she did get up with OT earlier and she just didn't sleep well.  She requested PT "another day" for a longer walk in the hall.  She is getting up with her rollator in the room. Will check back as schedule permits.   Galen Manila 10/28/2021, 9:49 AM

## 2021-10-28 NOTE — Plan of Care (Signed)
°  Problem: Education: Goal: Knowledge of General Education information will improve Description: Including pain rating scale, medication(s)/side effects and non-pharmacologic comfort measures Outcome: Progressing   Problem: Clinical Measurements: Goal: Ability to maintain clinical measurements within normal limits will improve Outcome: Progressing Goal: Will remain free from infection Outcome: Progressing Goal: Diagnostic test results will improve Outcome: Progressing   Problem: Activity: Goal: Risk for activity intolerance will decrease Outcome: Progressing   Problem: Nutrition: Goal: Adequate nutrition will be maintained Outcome: Progressing   Problem: Pain Managment: Goal: General experience of comfort will improve Outcome: Progressing   Problem: Safety: Goal: Ability to remain free from injury will improve Outcome: Progressing   Problem: Skin Integrity: Goal: Risk for impaired skin integrity will decrease Outcome: Progressing

## 2021-10-28 NOTE — Progress Notes (Signed)
Informed Consent obtained and in patient's chart for ERCP procedure tomorrow.

## 2021-10-29 ENCOUNTER — Inpatient Hospital Stay (HOSPITAL_COMMUNITY): Payer: Medicare Other | Admitting: Certified Registered Nurse Anesthetist

## 2021-10-29 ENCOUNTER — Encounter (HOSPITAL_COMMUNITY)
Admission: EM | Disposition: A | Discharge status: Skilled Nursing Facility | Payer: Self-pay | Source: Home / Self Care | Attending: Family Medicine

## 2021-10-29 ENCOUNTER — Inpatient Hospital Stay (HOSPITAL_COMMUNITY): Payer: Medicare Other

## 2021-10-29 ENCOUNTER — Encounter (HOSPITAL_COMMUNITY): Payer: Self-pay | Admitting: Internal Medicine

## 2021-10-29 DIAGNOSIS — E785 Hyperlipidemia, unspecified: Secondary | ICD-10-CM | POA: Diagnosis present

## 2021-10-29 DIAGNOSIS — Z853 Personal history of malignant neoplasm of breast: Secondary | ICD-10-CM | POA: Diagnosis not present

## 2021-10-29 DIAGNOSIS — Z66 Do not resuscitate: Secondary | ICD-10-CM | POA: Diagnosis present

## 2021-10-29 DIAGNOSIS — I4821 Permanent atrial fibrillation: Secondary | ICD-10-CM | POA: Diagnosis present

## 2021-10-29 DIAGNOSIS — K831 Obstruction of bile duct: Secondary | ICD-10-CM | POA: Diagnosis present

## 2021-10-29 DIAGNOSIS — C259 Malignant neoplasm of pancreas, unspecified: Secondary | ICD-10-CM | POA: Diagnosis present

## 2021-10-29 DIAGNOSIS — D378 Neoplasm of uncertain behavior of other specified digestive organs: Secondary | ICD-10-CM | POA: Diagnosis present

## 2021-10-29 DIAGNOSIS — R17 Unspecified jaundice: Secondary | ICD-10-CM | POA: Diagnosis present

## 2021-10-29 DIAGNOSIS — Z85038 Personal history of other malignant neoplasm of large intestine: Secondary | ICD-10-CM | POA: Diagnosis not present

## 2021-10-29 DIAGNOSIS — E039 Hypothyroidism, unspecified: Secondary | ICD-10-CM | POA: Diagnosis present

## 2021-10-29 DIAGNOSIS — N1831 Chronic kidney disease, stage 3a: Secondary | ICD-10-CM | POA: Diagnosis present

## 2021-10-29 DIAGNOSIS — Z20822 Contact with and (suspected) exposure to covid-19: Secondary | ICD-10-CM | POA: Diagnosis present

## 2021-10-29 DIAGNOSIS — I2510 Atherosclerotic heart disease of native coronary artery without angina pectoris: Secondary | ICD-10-CM | POA: Diagnosis present

## 2021-10-29 DIAGNOSIS — I272 Pulmonary hypertension, unspecified: Secondary | ICD-10-CM | POA: Diagnosis present

## 2021-10-29 DIAGNOSIS — K269 Duodenal ulcer, unspecified as acute or chronic, without hemorrhage or perforation: Secondary | ICD-10-CM | POA: Diagnosis present

## 2021-10-29 DIAGNOSIS — N179 Acute kidney failure, unspecified: Secondary | ICD-10-CM | POA: Diagnosis not present

## 2021-10-29 DIAGNOSIS — E872 Acidosis, unspecified: Secondary | ICD-10-CM | POA: Diagnosis not present

## 2021-10-29 DIAGNOSIS — Z7189 Other specified counseling: Secondary | ICD-10-CM | POA: Diagnosis not present

## 2021-10-29 DIAGNOSIS — I5042 Chronic combined systolic (congestive) and diastolic (congestive) heart failure: Secondary | ICD-10-CM | POA: Diagnosis present

## 2021-10-29 DIAGNOSIS — K859 Acute pancreatitis without necrosis or infection, unspecified: Secondary | ICD-10-CM | POA: Diagnosis not present

## 2021-10-29 DIAGNOSIS — Z7901 Long term (current) use of anticoagulants: Secondary | ICD-10-CM | POA: Diagnosis not present

## 2021-10-29 DIAGNOSIS — I130 Hypertensive heart and chronic kidney disease with heart failure and stage 1 through stage 4 chronic kidney disease, or unspecified chronic kidney disease: Secondary | ICD-10-CM | POA: Diagnosis present

## 2021-10-29 DIAGNOSIS — Z515 Encounter for palliative care: Secondary | ICD-10-CM | POA: Diagnosis not present

## 2021-10-29 DIAGNOSIS — J439 Emphysema, unspecified: Secondary | ICD-10-CM | POA: Diagnosis present

## 2021-10-29 DIAGNOSIS — F32A Depression, unspecified: Secondary | ICD-10-CM | POA: Diagnosis present

## 2021-10-29 DIAGNOSIS — E162 Hypoglycemia, unspecified: Secondary | ICD-10-CM | POA: Diagnosis not present

## 2021-10-29 DIAGNOSIS — I739 Peripheral vascular disease, unspecified: Secondary | ICD-10-CM | POA: Diagnosis present

## 2021-10-29 HISTORY — PX: SPHINCTEROTOMY: SHX5544

## 2021-10-29 HISTORY — PX: BILIARY STENT PLACEMENT: SHX5538

## 2021-10-29 HISTORY — PX: ERCP: SHX5425

## 2021-10-29 HISTORY — PX: BILIARY BRUSHING: SHX6843

## 2021-10-29 LAB — COMPREHENSIVE METABOLIC PANEL
ALT: 86 U/L — ABNORMAL HIGH (ref 0–44)
AST: 68 U/L — ABNORMAL HIGH (ref 15–41)
Albumin: 2.7 g/dL — ABNORMAL LOW (ref 3.5–5.0)
Alkaline Phosphatase: 169 U/L — ABNORMAL HIGH (ref 38–126)
Anion gap: 7 (ref 5–15)
BUN: 22 mg/dL (ref 8–23)
CO2: 21 mmol/L — ABNORMAL LOW (ref 22–32)
Calcium: 10.1 mg/dL (ref 8.9–10.3)
Chloride: 110 mmol/L (ref 98–111)
Creatinine, Ser: 0.75 mg/dL (ref 0.44–1.00)
GFR, Estimated: >60 mL/min (ref >60)
Glucose, Bld: 93 mg/dL (ref 70–99)
Potassium: 4.4 mmol/L (ref 3.5–5.1)
Sodium: 138 mmol/L (ref 135–145)
Total Bilirubin: 9.3 mg/dL — ABNORMAL HIGH (ref 0.3–1.2)
Total Protein: 5.4 g/dL — ABNORMAL LOW (ref 6.5–8.1)

## 2021-10-29 LAB — CBC
HCT: 33.4 % — ABNORMAL LOW (ref 36.0–46.0)
Hemoglobin: 11.3 g/dL — ABNORMAL LOW (ref 12.0–15.0)
MCH: 32.5 pg (ref 26.0–34.0)
MCHC: 33.8 g/dL (ref 30.0–36.0)
MCV: 96 fL (ref 80.0–100.0)
Platelets: 207 10*3/uL (ref 150–400)
RBC: 3.48 MIL/uL — ABNORMAL LOW (ref 3.87–5.11)
RDW: 17.2 % — ABNORMAL HIGH (ref 11.5–15.5)
WBC: 6.1 10*3/uL (ref 4.0–10.5)
nRBC: 0 % (ref 0.0–0.2)

## 2021-10-29 LAB — MAGNESIUM: Magnesium: 2.1 mg/dL (ref 1.7–2.4)

## 2021-10-29 SURGERY — ERCP, WITH INTERVENTION IF INDICATED
Anesthesia: General

## 2021-10-29 MED ORDER — SODIUM CHLORIDE 0.9 % IV SOLN
INTRAVENOUS | Status: DC | PRN
  Administered 2021-10-29: 50 mL

## 2021-10-29 MED ORDER — DIGOXIN 0.0625 MG HALF TABLET
0.0625 mg | ORAL_TABLET | Freq: Every day | ORAL | Status: DC
  Administered 2021-10-30 – 2021-11-04 (×6): 0.0625 mg via ORAL
  Filled 2021-10-29 (×7): qty 1

## 2021-10-29 MED ORDER — ALPRAZOLAM 0.25 MG PO TABS
0.2500 mg | ORAL_TABLET | Freq: Three times a day (TID) | ORAL | Status: DC | PRN
  Administered 2021-10-29: 0.25 mg via ORAL
  Filled 2021-10-29: qty 1

## 2021-10-29 MED ORDER — ALUM & MAG HYDROXIDE-SIMETH 200-200-20 MG/5ML PO SUSP: 30.0000 mL | ORAL | Status: DC | PRN

## 2021-10-29 MED ORDER — CARVEDILOL 3.125 MG PO TABS
3.1250 mg | ORAL_TABLET | Freq: Two times a day (BID) | ORAL | Status: DC
  Administered 2021-10-29: 3.125 mg via ORAL
  Filled 2021-10-29: qty 1

## 2021-10-29 MED ORDER — GLUCAGON HCL RDNA (DIAGNOSTIC) 1 MG IJ SOLR
INTRAMUSCULAR | Status: AC
  Filled 2021-10-29: qty 1

## 2021-10-29 MED ORDER — HYDROCODONE-ACETAMINOPHEN 5-325 MG PO TABS: 1.0000 | ORAL_TABLET | ORAL | Status: DC | PRN

## 2021-10-29 MED ORDER — CARVEDILOL 6.25 MG PO TABS
6.2500 mg | ORAL_TABLET | Freq: Two times a day (BID) | ORAL | Status: DC
  Administered 2021-10-30 – 2021-11-04 (×12): 6.25 mg via ORAL
  Filled 2021-10-29 (×12): qty 1

## 2021-10-29 MED ORDER — DIPHENHYDRAMINE HCL 50 MG/ML IJ SOLN
25.0000 mg | Freq: Once | INTRAMUSCULAR | Status: AC
  Administered 2021-10-29: 25 mg via INTRAVENOUS

## 2021-10-29 MED ORDER — ROCURONIUM BROMIDE 10 MG/ML (PF) SYRINGE
PREFILLED_SYRINGE | INTRAVENOUS | Status: DC | PRN
  Administered 2021-10-29: 40 mg via INTRAVENOUS

## 2021-10-29 MED ORDER — CEFAZOLIN SODIUM-DEXTROSE 2-4 GM/100ML-% IV SOLN
2.0000 g | Freq: Once | INTRAVENOUS | Status: AC
  Administered 2021-10-29: 1 g via INTRAVENOUS
  Filled 2021-10-29: qty 100

## 2021-10-29 MED ORDER — LIDOCAINE 2% (20 MG/ML) 5 ML SYRINGE
INTRAMUSCULAR | Status: DC | PRN
  Administered 2021-10-29: 60 mg via INTRAVENOUS

## 2021-10-29 MED ORDER — ALPRAZOLAM 0.25 MG PO TABS
0.2500 mg | ORAL_TABLET | Freq: Three times a day (TID) | ORAL | Status: DC | PRN
  Administered 2021-10-29 – 2021-11-01 (×3): 0.25 mg via ORAL
  Filled 2021-10-29 (×3): qty 1

## 2021-10-29 MED ORDER — ACETAMINOPHEN 325 MG PO TABS
650.0000 mg | ORAL_TABLET | Freq: Four times a day (QID) | ORAL | Status: DC | PRN
  Administered 2021-11-03 – 2021-11-04 (×3): 650 mg via ORAL
  Filled 2021-10-29 (×3): qty 2

## 2021-10-29 MED ORDER — INDOMETHACIN 50 MG RE SUPP
RECTAL | Status: DC | PRN
  Administered 2021-10-29: 100 mg via RECTAL

## 2021-10-29 MED ORDER — PROPOFOL 500 MG/50ML IV EMUL
INTRAVENOUS | Status: AC
  Filled 2021-10-29: qty 50

## 2021-10-29 MED ORDER — ONDANSETRON HCL 4 MG/2ML IJ SOLN
INTRAMUSCULAR | Status: DC | PRN
Start: 2021-10-29 — End: 2021-10-29
  Administered 2021-10-29: 4 mg via INTRAVENOUS

## 2021-10-29 MED ORDER — DIPHENHYDRAMINE HCL 50 MG/ML IJ SOLN
INTRAMUSCULAR | Status: AC
  Filled 2021-10-29: qty 1

## 2021-10-29 MED ORDER — FENTANYL CITRATE (PF) 100 MCG/2ML IJ SOLN
INTRAMUSCULAR | Status: AC
  Filled 2021-10-29: qty 2

## 2021-10-29 MED ORDER — LACTATED RINGERS IV SOLN
INTRAVENOUS | Status: AC | PRN
  Administered 2021-10-29: 10 mL/h via INTRAVENOUS

## 2021-10-29 MED ORDER — SIMETHICONE 80 MG PO CHEW
80.0000 mg | CHEWABLE_TABLET | Freq: Four times a day (QID) | ORAL | Status: DC | PRN
  Administered 2021-10-29 – 2021-10-30 (×2): 80 mg via ORAL
  Filled 2021-10-29 (×2): qty 1

## 2021-10-29 MED ORDER — CIPROFLOXACIN IN D5W 400 MG/200ML IV SOLN
INTRAVENOUS | Status: AC
  Filled 2021-10-29: qty 200

## 2021-10-29 MED ORDER — LEVOTHYROXINE SODIUM 75 MCG PO TABS
75.0000 ug | ORAL_TABLET | Freq: Every day | ORAL | Status: DC
  Administered 2021-10-30 – 2021-11-04 (×6): 75 ug via ORAL
  Filled 2021-10-29 (×7): qty 1

## 2021-10-29 MED ORDER — DEXAMETHASONE SODIUM PHOSPHATE 10 MG/ML IJ SOLN
INTRAMUSCULAR | Status: DC | PRN
  Administered 2021-10-29: 5 mg via INTRAVENOUS

## 2021-10-29 MED ORDER — DIPHENHYDRAMINE HCL 25 MG PO CAPS
25.0000 mg | ORAL_CAPSULE | Freq: Four times a day (QID) | ORAL | Status: DC | PRN
  Administered 2021-10-31 – 2021-11-03 (×3): 25 mg via ORAL
  Filled 2021-10-29 (×3): qty 1

## 2021-10-29 MED ORDER — PANTOPRAZOLE SODIUM 40 MG IV SOLR
40.0000 mg | INTRAVENOUS | Status: DC
  Administered 2021-10-29 – 2021-10-30 (×2): 40 mg via INTRAVENOUS
  Filled 2021-10-29 (×2): qty 40

## 2021-10-29 MED ORDER — METHYLPREDNISOLONE SODIUM SUCC 125 MG IJ SOLR
INTRAMUSCULAR | Status: AC
  Filled 2021-10-29: qty 2

## 2021-10-29 MED ORDER — DOCUSATE SODIUM 100 MG PO CAPS
100.0000 mg | ORAL_CAPSULE | Freq: Every day | ORAL | Status: DC | PRN
  Administered 2021-10-29: 100 mg via ORAL
  Filled 2021-10-29: qty 1

## 2021-10-29 MED ORDER — MELATONIN 5 MG PO TABS
10.0000 mg | ORAL_TABLET | Freq: Every day | ORAL | Status: DC
  Administered 2021-10-29 – 2021-11-04 (×7): 10 mg via ORAL
  Filled 2021-10-29 (×7): qty 2

## 2021-10-29 MED ORDER — METHYLPREDNISOLONE SODIUM SUCC 125 MG IJ SOLR
40.0000 mg | Freq: Once | INTRAMUSCULAR | Status: AC
  Administered 2021-10-29: 40 mg via INTRAVENOUS
  Filled 2021-10-29: qty 0.64

## 2021-10-29 MED ORDER — HYDROCODONE-ACETAMINOPHEN 5-325 MG PO TABS
1.0000 | ORAL_TABLET | ORAL | Status: DC | PRN
  Administered 2021-10-29 – 2021-10-30 (×2): 1 via ORAL
  Administered 2021-10-31 – 2021-11-03 (×8): 2 via ORAL
  Administered 2021-11-03: 1 via ORAL
  Filled 2021-10-29 (×4): qty 2
  Filled 2021-10-29: qty 1
  Filled 2021-10-29 (×4): qty 2
  Filled 2021-10-29 (×3): qty 1
  Filled 2021-10-29: qty 2
  Filled 2021-10-29: qty 1

## 2021-10-29 MED ORDER — CARVEDILOL 3.125 MG PO TABS
3.1250 mg | ORAL_TABLET | Freq: Once | ORAL | Status: AC
  Administered 2021-10-29: 3.125 mg via ORAL
  Filled 2021-10-29: qty 1

## 2021-10-29 MED ORDER — ALBUTEROL SULFATE (2.5 MG/3ML) 0.083% IN NEBU: 2.5000 mg | INHALATION_SOLUTION | Freq: Four times a day (QID) | RESPIRATORY_TRACT | Status: DC | PRN

## 2021-10-29 MED ORDER — INDOMETHACIN 50 MG RE SUPP
RECTAL | Status: AC
  Filled 2021-10-29: qty 2

## 2021-10-29 MED ORDER — MORPHINE SULFATE (PF) 2 MG/ML IV SOLN
1.0000 mg | INTRAVENOUS | Status: DC | PRN
  Administered 2021-10-29: 1 mg via INTRAVENOUS
  Filled 2021-10-29: qty 1

## 2021-10-29 MED ORDER — PROPOFOL 10 MG/ML IV BOLUS
INTRAVENOUS | Status: DC | PRN
  Administered 2021-10-29: 100 mg via INTRAVENOUS

## 2021-10-29 MED ORDER — FENTANYL CITRATE (PF) 100 MCG/2ML IJ SOLN
INTRAMUSCULAR | Status: DC | PRN
Start: 2021-10-29 — End: 2021-10-29
  Administered 2021-10-29 (×2): 50 ug via INTRAVENOUS

## 2021-10-29 MED ORDER — SUGAMMADEX SODIUM 200 MG/2ML IV SOLN
INTRAVENOUS | Status: DC | PRN
  Administered 2021-10-29: 150 mg via INTRAVENOUS

## 2021-10-29 NOTE — Transfer of Care (Signed)
Immediate Anesthesia Transfer of Care Note  Patient: Debra Brooks  Procedure(s) Performed: ENDOSCOPIC RETROGRADE CHOLANGIOPANCREATOGRAPHY (ERCP) BILIARY BRUSHING BILIARY STENT PLACEMENT SPHINCTEROTOMY  Patient Location: PACU  Anesthesia Type:General  Level of Consciousness: sedated, patient cooperative and responds to stimulation  Airway & Oxygen Therapy: Patient Spontanous Breathing and Patient connected to face mask oxygen  Post-op Assessment: Report given to RN and Post -op Vital signs reviewed and stable  Post vital signs: Reviewed and stable  Last Vitals:  Vitals Value Taken Time  BP    Temp    Pulse 68 10/29/21 1324  Resp    SpO2 100 % 10/29/21 1324  Vitals shown include unvalidated device data.  Last Pain:  Vitals:   10/29/21 1106  TempSrc: Oral  PainSc: 0-No pain         Complications: No notable events documented.

## 2021-10-29 NOTE — Anesthesia Postprocedure Evaluation (Signed)
Anesthesia Post Note  Patient: Debra Brooks  Procedure(s) Performed: ENDOSCOPIC RETROGRADE CHOLANGIOPANCREATOGRAPHY (ERCP) BILIARY BRUSHING BILIARY STENT PLACEMENT SPHINCTEROTOMY     Patient location during evaluation: PACU Anesthesia Type: General Level of consciousness: awake Pain management: pain level controlled Vital Signs Assessment: post-procedure vital signs reviewed and stable Respiratory status: spontaneous breathing Cardiovascular status: stable Postop Assessment: no apparent nausea or vomiting Anesthetic complications: no   No notable events documented.  Last Vitals:  Vitals:   10/29/21 1335 10/29/21 1350  BP:  (!) 180/101  Pulse: 61 66  Resp: 12 18  Temp:  36.4 C  SpO2: 98% 95%    Last Pain:  Vitals:   10/29/21 1355  TempSrc:   PainSc: 0-No pain                 Huston Foley

## 2021-10-29 NOTE — Anesthesia Procedure Notes (Signed)
Procedure Name: Intubation Date/Time: 10/29/2021 12:05 PM Performed by: Gean Maidens, CRNA Pre-anesthesia Checklist: Patient identified, Emergency Drugs available, Suction available, Patient being monitored and Timeout performed Patient Re-evaluated:Patient Re-evaluated prior to induction Oxygen Delivery Method: Circle system utilized Preoxygenation: Pre-oxygenation with 100% oxygen Induction Type: IV induction Ventilation: Mask ventilation without difficulty Laryngoscope Size: Mac and 3 Grade View: Grade I Tube type: Oral Tube size: 7.0 mm Number of attempts: 1 Airway Equipment and Method: Stylet Placement Confirmation: ETT inserted through vocal cords under direct vision, positive ETCO2 and breath sounds checked- equal and bilateral Secured at: 21 cm Tube secured with: Tape Dental Injury: Teeth and Oropharynx as per pre-operative assessment

## 2021-10-29 NOTE — Progress Notes (Signed)
Debra Brooks  LZJ:[id number] DOB: [birth year] DOA: 10/27/2021 PCP: Royal Hawthorn, NP    Brief Narrative:  [phone] independent living resident with a history of chronic anemia, anxiety, atrial fibrillation status post ablation on Eliquis, breast cancer, CAD, chronic combined systolic and diastolic CHF, CKD stage III, chronic venous insufficiency, HTN, GERD, HLD, hypothyroidism, and RBBB who presented to the ED with jaundice for 2 to 3 days.  An outpatient ultrasound has been accomplished which noted intra and extrahepatic biliary dilatation suggestive of bile duct obstruction.  She was then informed to present to the ED for further evaluation.  The case was discussed with GI by the ED physician and they reportedly suggested an MRCP.  Consultants:  [facility]  Code Status: NO CODE BLUE  Antimicrobials:  none  DVT prophylaxis: SCDs  Interim history: Afebrile.  Vital signs stable.  98% room air.  LFTs stable with total bilirubin still markedly elevated at 9.3.  Doing well post ERCP with stenting.  Reports gaseous abdominal distention.  Also states that she is hungry.  Denies shortness of breath or chest pain.  Is alert and conversant.  Assessment & Plan:  Newly appreciated 2.3 x 2.2 by 1.9 cm Pancreatic Neck Mass  Findings most c/w pancreatic cancer - no evidence of masses/metastatic disease anywhere else on MRCP or [place] of liver - pt does not desire further investigation - she has been counseled this is likely a cancer  Biliary obstruction - Jaundice MRCP noted mass in neck of pancreas c/w adenocarcinoma which is leading to bile duct compression - GI placed palliative biliary stent today   Recent Labs  Lab 10/26/21 1614 10/27/21 1202 10/28/21 0625 10/29/21 0603  AST 76* 73* 69* 68*  ALT 114* 105* 89* 86*  ALKPHOS  --  171* 157* 169*  BILITOT 9.9* 9.3* 8.3* 9.3*  PROT 5.7* 6.1* 5.4* 5.4*  ALBUMIN  --  3.7 2.9* 2.7*    Severe hypokalemia Due to poor intake -corrected with  supplementation  Recent Labs  Lab 10/26/21 1614 10/27/21 1202 10/28/21 0625 10/28/21 1238 10/29/21 0603  K 3.3* 3.1* 2.5* 3.0* 4.4    HTN No indication for strict control in this clinical circumstance  CKD stage III Baseline creatinine 1.1 - crt stable   Chronic combined diastolic and systolic CHF EF 97-35% per TTE 2021 - no volume overload on exam   Permanent atrial fibrillation held Eliquis for biliary intervention - continue Coreg and digoxin  Hypothyroidism Continue usual Synthroid dose  CAD Continue beta-blocker  Chronic R ovarian cyst Has not desired w/u previously   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 rate (!) 21, height 5\' 6"  (1.676 m), weight 57.2 kg, SpO2 98 %.  Intake/Output Summary (Last 24 hours) at 10/29/2021 1245 Last data filed at 10/29/2021 1243 Gross per 24 hour  Intake 819.41 ml  Output --  Net 819.41 ml    Filed Weights   10/27/21 1143 10/29/21 1015  Weight: 57.2 kg 57.2 kg    Examination: General: No acute respiratory distress - jaundiced  Lungs: CTA B without wheezing Cardiovascular: RRR without murmur Abdomen: Soft, mildly distended, no mass, no rebound, bowel sounds hyperactive Extremities: No significant cyanosis, clubbing, or edema BLE  CBC: Recent Labs  Lab 10/26/21 1614 10/27/21 1202 10/28/21 3299  10/29/21 0603  WBC 6.6 5.9 5.8 6.1  NEUTROABS 4,957 4.7 4.2  --   HGB 13.0 12.5 11.0* 11.3*  HCT 38.3 37.6 32.8* 33.4*  MCV 93.9 93.8 96.2 96.0  PLT 242 227 201 062    Basic Metabolic Panel: Recent Labs  Lab 10/27/21 2026 10/28/21 0625 10/28/21 1238 10/29/21 0603  NA  --  136 135 138  K  --  2.5* 3.0* 4.4  CL  --  103 100 110  CO2  --  24 26 21*  GLUCOSE  --   93 119* 93  BUN  --  23 24* 22  CREATININE  --  0.76 0.96 0.75  CALCIUM  --  9.4 9.6 10.1  MG 1.9 1.9  --  2.1  PHOS 3.2 3.7  --   --     GFR: Estimated Creatinine Clearance: 38.8 mL/min (by C-G formula based on SCr of 0.75 mg/dL).  Liver Function Tests: Recent Labs  Lab 10/26/21 1614 10/27/21 1202 10/28/21 0625 10/29/21 0603  AST 76* 73* 69* 68*  ALT 114* 105* 89* 86*  ALKPHOS  --  171* 157* 169*  BILITOT 9.9* 9.3* 8.3* 9.3*  PROT 5.7* 6.1* 5.4* 5.4*  ALBUMIN  --  3.7 2.9* 2.7*    Recent Labs  Lab 10/27/21 1243  LIPASE 32     Cardiac Enzymes: Recent Labs  Lab 10/27/21 2026  CKTOTAL 60     HbA1C: Hgb A1c MFr Bld  Date/Time Value Ref Range Status  10/31/2017 05:56 AM 5.3 4.8 - 5.6 % Final    Comment:    (NOTE) Pre diabetes:          5.7%-6.4% Diabetes:              >6.4% Glycemic control for   <7.0% adults with diabetes     Recent Results (from the past 240 hour(s))  Resp Panel by RT-PCR (Flu A&B, Covid) Nasopharyngeal Swab     Status: None   Collection Time: 10/27/21  2:42 PM   Specimen: Nasopharyngeal Swab; Nasopharyngeal(NP) swabs in vial transport medium  Result Value Ref Range Status   SARS Coronavirus 2 by RT PCR NEGATIVE NEGATIVE Final    Comment: (NOTE) SARS-CoV-2 target nucleic acids are NOT DETECTED.  The SARS-CoV-2 RNA is generally detectable in upper respiratory specimens during the acute phase of infection. The lowest concentration of SARS-CoV-2 viral copies this assay can detect is 138 copies/mL. A negative result does not preclude SARS-Cov-2 infection and should not be used as the sole basis for treatment or other patient management decisions. A negative result may occur with  improper specimen collection/handling, submission of specimen other than nasopharyngeal swab, presence of viral mutation(s) within the areas targeted by this assay, and inadequate number of viral copies(<138 copies/mL). A negative result must be combined  with clinical observations, patient history, and epidemiological information. The expected result is Negative.  Fact Sheet for Patients:  EntrepreneurPulse.com.au  Fact Sheet for Healthcare Providers:  IncredibleEmployment.be  This test is no t yet approved or cleared by the [place] FDA and  has been authorized for detection and/or diagnosis of SARS-CoV-2 by FDA under an Emergency Use Authorization (EUA). This EUA will remain  in effect (meaning this test can be used) for the duration of the COVID-19 declaration under Section 564(b)(1) of the Act, 21 U.S.C.section 360bbb-3(b)(1), unless the authorization is terminated  or revoked sooner.       Influenza A by PCR NEGATIVE NEGATIVE Final   Influenza B  by PCR NEGATIVE NEGATIVE Final    Comment: (NOTE) The Xpert Xpress SARS-CoV-2/FLU/RSV plus assay is intended as an aid in the diagnosis of influenza from Nasopharyngeal swab specimens and should not be used as a sole basis for treatment. Nasal washings and aspirates are unacceptable for Xpert Xpress SARS-CoV-2/FLU/RSV testing.  Fact Sheet for Patients: EntrepreneurPulse.com.au  Fact Sheet for Healthcare Providers: IncredibleEmployment.be  This test is not yet approved or cleared by the [place] FDA and has been authorized for detection and/or diagnosis of SARS-CoV-2 by FDA under an Emergency Use Authorization (EUA). This EUA will remain in effect (meaning this test can be used) for the duration of the COVID-19 declaration under Section 564(b)(1) of the Act, 21 U.S.C. section 360bbb-3(b)(1), unless the authorization is terminated or revoked.  Performed at [facility], [address] [place], [place] [address]      Scheduled Meds:  [facility] Hold] carvedilol  3.125 mg Oral BID WC   [MAR Hold] digoxin  0.0625 mg Oral Daily   [MAR Hold] levothyroxine  75 mcg Oral Q0600   [MAR  Hold] melatonin  10 mg Oral QHS   methylPREDNISolone sodium succinate       [MAR Hold] potassium chloride  40 mEq Oral TID   [MAR Hold] sodium chloride flush  3 mL Intravenous Q12H   Continuous Infusions:  [MAR Hold] sodium chloride     0.9 % NaCl with KCl 20 mEq / L 50 mL/hr at 10/29/21 0530   lactated ringers 10 mL/hr at 10/29/21 1143     LOS: 0 days   Cherene Altes, MD [facility] Office  [phone] Pager - Text Page per Shea Evans  If 7PM-7AM, please contact night-coverage per Amion 10/29/2021, 12:45 PM

## 2021-10-29 NOTE — Op Note (Signed)
[facility] Patient Name: Debra Brooks Procedure Date: 10/29/2021 MRN: [id number] Attending MD: Clarene Essex , MD Date of Birth: June 25, 1925 CSN: [id number] Age: 88 Admit Type: Inpatient Procedure:                ERCP Indications:              Abnormal MRCP, Jaundice, Tumor of the head of                            pancreas Providers:                Clarene Essex, MD, Jeanella Cara, RN,                            William Dalton, Technician Referring MD:              Medicines:                General Anesthesia Complications:            No immediate complications. Estimated Blood Loss:     Estimated blood loss: none. Procedure:                Pre-Anesthesia Assessment:                           - Prior to the procedure, a History and Physical                            was performed, and patient medications and                            allergies were reviewed. The patient's tolerance of                            previous anesthesia was also reviewed. The risks                            and benefits of the procedure and the sedation                            options and risks were discussed with the patient.                            All questions were answered, and informed consent                            was obtained. Prior Anticoagulants: The patient has                            taken Eliquis (apixaban), last dose was 3 days                            prior to procedure. ASA Grade Assessment: III - A                            patient with  severe systemic disease. After                            reviewing the risks and benefits, the patient was                            deemed in satisfactory condition to undergo the                            procedure.                           After obtaining informed consent, the scope was                            passed under direct vision. Throughout the                            procedure, the patient's blood  pressure, pulse, and                            oxygen saturations were monitored continuously. The                            [id number]-Q190V ([id number] Olympus duodenoscope was                            introduced through the mouth, and used to inject                            contrast into and used to cannulate the bile duct.                            The ERCP was technically difficult and complex due                            to challenging cannulation because of abnormal                            anatomy. Successful completion of the procedure was                            aided by performing the maneuvers documented                            (below) in this report. The patient tolerated the                            procedure well. Findings:      The major papilla was normal. Despite multiple attempts we were unable       to advance the wire so we proceeded with a A biliary pre-cut       sphincterotomy was made with a standard size Hydratome sphincterotome       using ERBE electrocautery. There was no post-sphincterotomy  bleeding. 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       There was no post-sphincterotomy bleeding. Cells for cytology were       obtained by brushing in the middle third of the main bile duct. One 10       Fr by 6 cm uncovered metal stent with no external flaps and no internal       flaps was placed  5.5 cm into the common bile duct. Clear fluid i.e.       contrast flowed through the stent. The stent was in good position. The       upper GI tract was traversed under direct vision without detailed       examination. Multiple shallow non-bleeding superficial duodenal ulcers       with no stigmata of bleeding were found in the duodenal bulb and in the       first portion of the duodenum not mentioned above. The wire introducer       and scope was then slowly withdrawn and not mentioned above there was no       pancreatic duct injection and possibly the wire went towards the       pancreas 1 time although it may have been in a different false channel       at that point Impression:               - The major papilla appeared normal. Scattered                            superficial duodenal ulcers were seen                           - A biliary sphincterotomy was performed.                           - A biliary sphincterotomy was performed.                           - Cells for cytology obtained in the middle third                            of the main bile duct.                           - One uncovered metal stent was placed into the  common bile duct. Moderate Sedation:      Not Applicable - Patient had care per Anesthesia. Recommendation:           - Clear liquid diet for 6 hours. If doing well at 6                            PM may have soft solids otherwise hopefully can                            advance diet tomorrow                           - Continue present medications.                           - Await cytology results.                           - Return to GI clinic PRN. Consider oncology or                            even surgical consultation if patient and family                            are interested                           - Telephone GI clinic for pathology results in 5                            days.                           -  Telephone GI clinic if symptomatic PRN.                           - Check liver enzymes (AST, ALT, alkaline                            phosphatase, bilirubin) in the morning. And follow                            back to near normal as an outpatient                           - Use Protonix (pantoprazole) 40 mg PO daily                            indefinitely. Although can wean to 20 mg in a month                            or 2 Procedure Code(s):        --- Professional ---  08676, Endoscopic retrograde                            cholangiopancreatography (ERCP); with placement of                            endoscopic stent into biliary or pancreatic duct,                            including pre- and post-dilation and guide wire                            passage, when performed, including sphincterotomy,                            when performed, each stent Diagnosis Code(s):        --- Professional ---                           R17, Unspecified jaundice                           D49.0, Neoplasm of unspecified behavior of                            digestive system                           R93.2, Abnormal findings on diagnostic imaging of                            liver and biliary tract CPT copyright 2019 [employer]. All rights reserved. The codes documented in this report are preliminary and upon coder review may  be revised to meet current compliance requirements. Clarene Essex, MD 10/29/2021 1:13:56 PM This report has been signed electronically. Number of Addenda: 0

## 2021-10-29 NOTE — Anesthesia Preprocedure Evaluation (Addendum)
Anesthesia Evaluation  Patient identified by MRN, date of birth, ID band Patient awake    Reviewed: Allergy & Precautions, NPO status , Patient's Chart, lab work & pertinent test results  Airway Mallampati: I       Dental  (+) Teeth Intact, Poor Dentition   Pulmonary former smoker,    Pulmonary exam normal        Cardiovascular hypertension, Pt. on home beta blockers + CAD and + Peripheral Vascular Disease  Normal cardiovascular exam     Neuro/Psych PSYCHIATRIC DISORDERS Anxiety Depression    GI/Hepatic   Endo/Other  Hypothyroidism   Renal/GU      Musculoskeletal   Abdominal Normal abdominal exam  (+)   Peds  Hematology  (+) anemia ,   Anesthesia Other Findings  IMPRESSIONS    1. Left ventricular ejection fraction, by estimation, is 35 to 40%. The  left ventricle has moderately decreased function. The left ventricle  demonstrates regional wall motion abnormalities (see scoring  diagram/findings for description). There is  moderate concentric left ventricular hypertrophy. Left ventricular  diastolic parameters are consistent with Grade III diastolic dysfunction  (restrictive). There is severe hypokinesis of the left ventricular, basal  inferior wall.  2. Right ventricular systolic function is normal. The right ventricular  size is normal. There is severely elevated pulmonary artery systolic  pressure.  3. Left atrial size was moderately dilated.  4. Right atrial size was severely dilated.  5. The mitral valve is rheumatic. Moderate to severe mitral valve  regurgitation.  6. Tricuspid valve regurgitation is moderate to severe.  7. The aortic valve is tricuspid. There is moderate calcification of the  aortic valve. There is mild thickening of the aortic valve. Aortic valve  regurgitation is not visualized. Mild to moderate aortic valve  sclerosis/calcification is present, without  any evidence of  aortic stenosis.  8. The inferior vena cava is dilated in size with <50% respiratory  variability, suggesting right atrial pressure of 15 mmHg.   Conclusion(s)/Recommendation(s): EF appears reduced compared to prior.  There is mild-moderate global hypokinesis as well as previously noted  basal inferior wall motion abnormalitiy. Eccentric MR, at least moderate  and possibly severe. Restrictive  filling, elevated PA pressures, dilated IVC.   FINDINGS  Left Ventricle: Left ventricular ejection fraction, by estimation, is 35  to 40%. The left ventricle has moderately decreased function. The left  ventricle demonstrates regional wall motion abnormalities. Severe  hypokinesis of the left ventricular, basal  inferior wall. The left ventricular internal cavity size was normal in  size. There is moderate concentric left ventricular hypertrophy. Left  ventricular diastolic parameters are consistent with Grade III diastolic  dysfunction (restrictive).   Right Ventricle: The right ventricular size is normal. No increase in  right ventricular wall thickness. Right ventricular systolic function is  normal. There is severely elevated pulmonary artery systolic pressure. The  tricuspid regurgitant velocity is  3.68 m/s, and with an assumed right atrial pressure of 15 mmHg, the  estimated right ventricular systolic pressure is 94.7 mmHg.   Left Atrium: Left atrial size was moderately dilated.   Right Atrium: Right atrial size was severely dilated.   Pericardium: Trivial pericardial effusion is present.   Mitral Valve: At least moderate eccentric posterior directed MR. The  mitral valve is rheumatic. 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   Tricuspid Valve: The tricuspid valve is normal in structure. Tricuspid  valve regurgitation is moderate to severe.   Aortic Valve: The aortic valve is tricuspid.  There is moderate  calcification of the aortic valve. There is mild thickening of the  aortic  valve. There is moderate aortic valve annular calcification. Aortic valve  regurgitation is not visualized. Mild to  moderate aortic valve sclerosis/calcification is present, without any  evidence of aortic stenosis.   Pulmonic Valve: The pulmonic valve was grossly normal. Pulmonic valve  regurgitation is mild. No evidence of pulmonic stenosis.   Aorta: The aortic root and ascending aorta are structurally normal, with  no evidence of dilitation.   Venous: The inferior vena cava is dilated in size with less than 50%  respiratory variability, suggesting right atrial pressure of 15 mmHg.   IAS/Shunts: The atrial septum is grossly normal.   Additional Comments: There is a small pleural effusion in both left and  right lateral regions.     LEFT VENTRICLE  PLAX 2D  LVIDd:     4.20 cm   Diastology  LVIDs:     3.40 cm   LV e' medial:  3.68 cm/s  LV PW:     1.30 cm   LV E/e' medial: 23.3  LV IVS:    1.30 cm   LV e' lateral:  7.80 cm/s  LVOT diam:   2.40 cm   LV E/e' lateral: 11.0  LV SV:     56  LV SV Index:  33  LVOT Area:   4.52 cm    LV Volumes (MOD)  LV vol d, MOD A4C: 86.0 ml  LV vol s, MOD A4C: 52.2 ml  LV SV MOD A4C:   86.0 ml   RIGHT VENTRICLE  RV Basal diam: 3.20 cm  RV S prime:   8.59 cm/s  TAPSE (M-mode): 2.1 cm   LEFT ATRIUM       Index    RIGHT ATRIUM      Index  LA diam:    3.70 cm 2.20 cm/m RA Area:   21.00 cm  LA Vol (A2C):  56.7 ml 33.73 ml/m RA Volume:  65.40 ml 38.90 ml/m  LA Vol (A4C):  45.6 ml 27.12 ml/m  LA Biplane Vol: 53.6 ml 31.88 ml/m  AORTIC VALVE  LVOT Vmax:  63.30 cm/s  LVOT Vmean: 39.300 cm/s  LVOT VTI:  0.123 m    AORTA  Ao Root diam: 2.90 cm   MITRAL VALVE        TRICUSPID VALVE  MV Area (PHT): 5.27 cm  TR Peak grad:  54.2 mmHg  MV Decel Time: 144 msec  TR Vmax:    368.00 cm/s  MV E velocity: 85.90 cm/s  MV A velocity: 26.30  cm/s SHUNTS  MV E/A ratio: 3.27    Systemic VTI: 0.12 m               Systemic Diam: 2.40 cm   Buford Dresser MD  Electronically signed by Buford Dresser MD  Signature Date/Time: 07/07/2020/9:59:00 AM      Final   Imaging Info  ECHOCARDIOGRAM COMPLETE (Order #[id number]) on 07/05/20  Study History  ECHOCARDIOGRAM COMPLETE (Order #[id number]) on 07/05/20  [employer] Images  Show images for ECHOCARDIOGRAM COMPLETE Images on [employer]  Show images for [employer] Performing Technologist/Nurse  Performing Technologist/Nurse: Matilde Bash Reason for Exam Priority: Routine Not on file  Patient Data  Height  66 in   BP  149/83 mmHg      Surgical History  Surgical History   Procedure Laterality Date Comment Source CARDIAC CATHETERIZATION  2010 60% ostial RCA lesion.  Managed medically   Other Surgical History   Procedure Laterality Date Comment Source APPENDECTOMY     BLADDER SURGERY   tacking  BREAST SURGERY     COLECTOMY     FEMORAL ARTERY REPAIR  2008 right  TONSILLECTOMY       Implants  No active implants to display in this view.  Encounter-Level Documents on 07/04/2020:  Scan on 07/09/2020 9:29 PM by Default, Provider, MD Document on 07/09/2020 4:15 AM by Nigel Berthold, NT: ED PB Billing Extract Document on 07/07/2020 1:05 PM by Dyann Kief, RN: IP After Visit Summary Electronic signature on 07/05/2020 12:26 PM - E-signed Electronic signature on 07/05/2020 12:25 PM - 1 of 3 e-signatures recorded Scan on 07/07/2020 9:50 AM by Default, Provider, MD Scan on 07/07/2020 7:27 AM by Default, Provider, MD Scan on 07/04/2020 11:09 PM by Default, Provider, MD Scan on 07/04/2020 11:23 PM by Nancy Nordmann Documents on 07/04/2020:  Scan on 07/07/2020 9:59 AM by Default, Provider, MD Scan on 07/06/2020 11:37 PM by Default, Provider, MD   Resulted  by:  Signed Date/Time  Phone Pager Buford Dresser 07/07/2020 9:59 AM [phone]   External Result Report  External Result Report   ECHOCARDIOGRAM COMPLETE: Patient Communication  Add Comments Seen    Existing Charges  Charge Line Charge Code Status Charge Trigger Charge Type [id number] [facility] Tte 2d Echo Complete ([id number]) [facility] Billing Imaging end exam Technical [id number] Echo Heart Xthoracic,Complete W Doppler [place] [facility] result study Professional     Reproductive/Obstetrics                           Anesthesia Physical Anesthesia Plan  ASA: 3  Anesthesia Plan: General   Post-op Pain Management:    Induction: Intravenous  PONV Risk Score and Plan: Ondansetron and Treatment may vary due to age or medical condition  Airway Management Planned: Oral ETT  Additional Equipment: None  Intra-op Plan:   Post-operative Plan: Extubation in OR  Informed Consent: I have reviewed the patients History and Physical, chart, labs and discussed the procedure including the risks, benefits and alternatives for the proposed anesthesia with the patient or authorized representative who has indicated his/her understanding and acceptance.     Dental advisory given  Plan Discussed with: CRNA  Anesthesia Plan Comments:         Anesthesia Quick Evaluation

## 2021-10-29 NOTE — Progress Notes (Signed)
Debra Brooks 11:05 AM  Subjective: Patient doing well without any problems overnight and we rediscussed the procedure and she has no additional questions  Objective: Vital signs stable afebrile no acute distress exam please see preassessment evaluation labs stable MRI reviewed  Assessment: Obstructive jaundice probable pancreatic mass  Plan: Okay to proceed with ERCP today with hopefully brushings and stent with anesthesia assistance  [facility]  office [phone] After 5PM or if no answer call [phone]

## 2021-10-30 DIAGNOSIS — K831 Obstruction of bile duct: Secondary | ICD-10-CM | POA: Diagnosis not present

## 2021-10-30 LAB — COMPREHENSIVE METABOLIC PANEL
ALT: 83 U/L — ABNORMAL HIGH (ref 0–44)
AST: 83 U/L — ABNORMAL HIGH (ref 15–41)
Albumin: 3.2 g/dL — ABNORMAL LOW (ref 3.5–5.0)
Alkaline Phosphatase: 206 U/L — ABNORMAL HIGH (ref 38–126)
Anion gap: 10 (ref 5–15)
BUN: 37 mg/dL — ABNORMAL HIGH (ref 8–23)
CO2: 17 mmol/L — ABNORMAL LOW (ref 22–32)
Calcium: 10 mg/dL (ref 8.9–10.3)
Chloride: 105 mmol/L (ref 98–111)
Creatinine, Ser: 1.39 mg/dL — ABNORMAL HIGH (ref 0.44–1.00)
GFR, Estimated: 35 mL/min — ABNORMAL LOW (ref >60)
Glucose, Bld: 196 mg/dL — ABNORMAL HIGH (ref 70–99)
Potassium: 5.8 mmol/L — ABNORMAL HIGH (ref 3.5–5.1)
Sodium: 132 mmol/L — ABNORMAL LOW (ref 135–145)
Total Bilirubin: 9.7 mg/dL — ABNORMAL HIGH (ref 0.3–1.2)
Total Protein: 6.2 g/dL — ABNORMAL LOW (ref 6.5–8.1)

## 2021-10-30 LAB — LIPASE, BLOOD: Lipase: 1366 U/L — ABNORMAL HIGH (ref 11–51)

## 2021-10-30 LAB — CBC
HCT: 40.3 % (ref 36.0–46.0)
Hemoglobin: 13.2 g/dL (ref 12.0–15.0)
MCH: 31.8 pg (ref 26.0–34.0)
MCHC: 32.8 g/dL (ref 30.0–36.0)
MCV: 97.1 fL (ref 80.0–100.0)
Platelets: 246 10*3/uL (ref 150–400)
RBC: 4.15 MIL/uL (ref 3.87–5.11)
RDW: 18 % — ABNORMAL HIGH (ref 11.5–15.5)
WBC: 11.5 10*3/uL — ABNORMAL HIGH (ref 4.0–10.5)
nRBC: 0 % (ref 0.0–0.2)

## 2021-10-30 MED ORDER — MORPHINE SULFATE (PF) 2 MG/ML IV SOLN
1.0000 mg | INTRAVENOUS | Status: DC | PRN
  Administered 2021-10-30 – 2021-10-31 (×4): 1 mg via INTRAVENOUS
  Administered 2021-10-31: 2 mg via INTRAVENOUS
  Filled 2021-10-30 (×5): qty 1

## 2021-10-30 MED ORDER — SODIUM ZIRCONIUM CYCLOSILICATE 10 G PO PACK
10.0000 g | PACK | Freq: Once | ORAL | Status: AC
  Administered 2021-10-30: 10 g via ORAL
  Filled 2021-10-30 (×2): qty 1

## 2021-10-30 MED ORDER — DEXTROSE-NACL 5-0.9 % IV SOLN
INTRAVENOUS | Status: DC
  Administered 2021-10-30: 1000 mL via INTRAVENOUS

## 2021-10-30 MED ORDER — SODIUM BICARBONATE 650 MG PO TABS
325.0000 mg | ORAL_TABLET | Freq: Three times a day (TID) | ORAL | Status: DC
  Administered 2021-10-30 – 2021-11-04 (×17): 325 mg via ORAL
  Filled 2021-10-30 (×17): qty 1

## 2021-10-30 NOTE — Progress Notes (Signed)
Debra Brooks  PFX:[id number] DOB: 02-08-1925 DOA: 10/27/2021 PCP: Royal Hawthorn, NP    Brief Narrative:  [phone] independent living resident with a history of chronic anemia, anxiety, atrial fibrillation status post ablation on Eliquis, breast cancer, CAD, chronic combined systolic and diastolic CHF, CKD stage III, chronic venous insufficiency, HTN, GERD, HLD, hypothyroidism, and RBBB who presented to the ED with jaundice for 2 to 3 days.  An outpatient ultrasound has been accomplished which noted intra and extrahepatic biliary dilatation suggestive of bile duct obstruction.  She was then informed to present to the ED for further evaluation.  The case was discussed with GI by the ED physician and they reportedly suggested an MRCP.  Consultants:  [facility]  Code Status: NO CODE BLUE  Antimicrobials:  none  DVT prophylaxis: SCDs  Interim history: Afebrile.  Vital signs stable with blood pressure improved.  Potassium elevated at 5.8 today with creatinine acutely elevated to 1.4.  Despite placement of stent yesterday the patient's total bilirubin has increased today as have her LFTs. She is alert. She reports ongoing epigastric discomfort, and anorexia. She feels bloated. She denies nausea or vomiting. No chest pain.   Assessment & Plan:  Newly appreciated 2.3 x 2.2 by 1.9 cm Pancreatic Neck Mass  Findings most c/w pancreatic cancer - no evidence of masses/metastatic disease anywhere else on MRCP or [place] of liver - pt does not desire further investigation - she has been counseled this is likely a cancer  Biliary obstruction - Jaundice MRCP noted mass in neck of pancreas c/w adenocarcinoma which is leading to bile duct compression - GI placed palliative biliary stent 10/29/21 -thus far total bilirubin continues to climb  Recent Labs  Lab 10/26/21 1614 10/27/21 1202 10/28/21 0625 10/29/21 0603 10/30/21 0558  AST 76* 73* 69* 68* 83*  ALT 114* 105* 89* 86* 83*  ALKPHOS  --  171* 157* 169*  206*  BILITOT 9.9* 9.3* 8.3* 9.3* 9.7*  PROT 5.7* 6.1* 5.4* 5.4* 6.2*  ALBUMIN  --  3.7 2.9* 2.7* 3.2*   Post-operative pancreatitis  Not surprising given difficulty of procedure as well as pancreatic mass - limit to clear liquids - resume IVF support - monitor clinically   Severe hypokalemia Due to poor intake -corrected with supplementation  Hyperkalemia The patient has now developed acute hyperkalemia in conjunction with acute renal failure - monitor trend without specific intervention at present  Acute kidney injury on CKD stage III Creatinine acutely worsened overnight - hopefully this will be a transient bump related to her pancreatitis - follow trend w/ volume expansion - baseline creatinine 1.1  HTN No indication for strict control in this clinical circumstance  Chronic combined diastolic and systolic CHF EF 24-26% per TTE 2021 - no volume overload on exam - care w/ IVF   Permanent atrial fibrillation held Eliquis for biliary intervention - continue Coreg and digoxin  Hypothyroidism Continue usual Synthroid dose  CAD Continue beta-blocker  Chronic R ovarian cyst Has not desired w/u previously   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 rate 16, height 5\' 6"  (1.676 m), weight 57.2 kg, SpO2 97 %. No intake or output data in the 24 hours ending 10/30/21 1431 Filed Weights   10/27/21 1143 10/29/21 1015  Weight: 57.2 kg 57.2 kg  Examination: General: No acute respiratory distress - jaundiced  Lungs: CTA B - no wheezing or crackles  Cardiovascular: RRR without murmur Abdomen: Soft, mildly distended, tender in epigastrium, no mass, no rebound  Extremities: No signif edema BLE  CBC: Recent Labs  Lab 10/26/21 1614 10/27/21 1202  10/28/21 0625 10/29/21 0603 10/30/21 0558  WBC 6.6 5.9 5.8 6.1 11.5*  NEUTROABS 4,957 4.7 4.2  --   --   HGB 13.0 12.5 11.0* 11.3* 13.2  HCT 38.3 37.6 32.8* 33.4* 40.3  MCV 93.9 93.8 96.2 96.0 97.1  PLT 242 227 201 207 250   Basic Metabolic Panel: Recent Labs  Lab 10/27/21 2026 10/28/21 0625 10/28/21 1238 10/29/21 0603 10/30/21 0558  NA  --  136 135 138 132*  K  --  2.5* 3.0* 4.4 5.8*  CL  --  103 100 110 105  CO2  --  24 26 21* 17*  GLUCOSE  --  93 119* 93 196*  BUN  --  23 24* 22 37*  CREATININE  --  0.76 0.96 0.75 1.39*  CALCIUM  --  9.4 9.6 10.1 10.0  MG 1.9 1.9  --  2.1  --   PHOS 3.2 3.7  --   --   --    GFR: Estimated Creatinine Clearance: 22.3 mL/min (A) (by C-G formula based on SCr of 1.39 mg/dL (H)).  Liver Function Tests: Recent Labs  Lab 10/27/21 1202 10/28/21 0625 10/29/21 0603 10/30/21 0558  AST 73* 69* 68* 83*  ALT 105* 89* 86* 83*  ALKPHOS 171* 157* 169* 206*  BILITOT 9.3* 8.3* 9.3* 9.7*  PROT 6.1* 5.4* 5.4* 6.2*  ALBUMIN 3.7 2.9* 2.7* 3.2*   Recent Labs  Lab 10/27/21 1243 10/30/21 0558  LIPASE 32 1,366*    Cardiac Enzymes: Recent Labs  Lab 10/27/21 2026  CKTOTAL 60    HbA1C: Hgb A1c MFr Bld  Date/Time Value Ref Range Status  10/31/2017 05:56 AM 5.3 4.8 - 5.6 % Final    Comment:    (NOTE) Pre diabetes:          5.7%-6.4% Diabetes:              >6.4% Glycemic control for   <7.0% adults with diabetes     Recent Results (from the past 240 hour(s))  Resp Panel by RT-PCR (Flu A&B, Covid) Nasopharyngeal Swab     Status: None   Collection Time: 10/27/21  2:42 PM   Specimen: Nasopharyngeal Swab; Nasopharyngeal(NP) swabs in vial transport medium  Result Value Ref Range Status   SARS Coronavirus 2 by RT PCR NEGATIVE NEGATIVE Final    Comment: (NOTE) SARS-CoV-2 target nucleic acids are NOT DETECTED.  The SARS-CoV-2 RNA is generally detectable in upper respiratory specimens during the acute phase of infection. The  lowest concentration of SARS-CoV-2 viral copies this assay can detect is 138 copies/mL. A negative result does not preclude SARS-Cov-2 infection and should not be used as the sole basis for treatment or other patient management decisions. A negative result may occur with  improper specimen collection/handling, submission of specimen other than nasopharyngeal swab, presence of viral mutation(s) within the areas targeted by this assay, and inadequate number of viral copies(<138 copies/mL). A negative result must be combined with clinical observations, patient history, and epidemiological information. The expected result is Negative.  Fact Sheet for Patients:  EntrepreneurPulse.com.au  Fact Sheet for Healthcare Providers:  IncredibleEmployment.be  This test is no t yet approved or cleared by the [place] and  has been authorized for detection and/or diagnosis of SARS-CoV-2 by FDA under an Emergency Use Authorization (EUA). This EUA will remain  in effect (meaning this test can be used) for the duration of the COVID-19 declaration under Section 564(b)(1) of the Act, 21 U.S.C.section 360bbb-3(b)(1), unless the authorization is terminated  or revoked sooner.       Influenza A by PCR NEGATIVE NEGATIVE Final   Influenza B by PCR NEGATIVE NEGATIVE Final    Comment: (NOTE) The Xpert Xpress SARS-CoV-2/FLU/RSV plus assay is intended as an aid in the diagnosis of influenza from Nasopharyngeal swab specimens and should not be used as a sole basis for treatment. Nasal washings and aspirates are unacceptable for Xpert Xpress SARS-CoV-2/FLU/RSV testing.  Fact Sheet for Patients: EntrepreneurPulse.com.au  Fact Sheet for Healthcare Providers: IncredibleEmployment.be  This test is not yet approved or cleared by the [place] FDA and has been authorized for detection and/or diagnosis of SARS-CoV-2 by FDA under  an Emergency Use Authorization (EUA). This EUA will remain in effect (meaning this test can be used) for the duration of the COVID-19 declaration under Section 564(b)(1) of the Act, 21 U.S.C. section 360bbb-3(b)(1), unless the authorization is terminated or revoked.  Performed at [facility], [address] [place], [place] [address]      Scheduled Meds:  carvedilol  6.25 mg Oral BID WC   digoxin  0.0625 mg Oral Daily   levothyroxine  75 mcg Oral Q0600   melatonin  10 mg Oral QHS   pantoprazole (PROTONIX) IV  40 mg Intravenous Q24H   sodium zirconium cyclosilicate  10 g Oral Once     LOS: 1 day   Cherene Altes, MD [facility] Office  [phone] Pager - Text Page per Shea Evans  If 7PM-7AM, please contact night-coverage per Amion 10/30/2021, 2:31 PM

## 2021-10-30 NOTE — Progress Notes (Addendum)
Debra Brooks 1:33 PM  Subjective: Patient in good spirits and her abdominal pain is not as bad as yesterday after the procedure and she is a little distended but it is eating some and we discussed her procedure and answered all of her and her friend's questions and discussed the stent jaundice etc.  Objective: Vital signs stable afebrile no acute distress abdomen is a little distended with some mild discomfort on palpation no guarding or rebound slight increase BUN and creatinine increased lipase LFTs about the same white count increased hemoglobin increased  Assessment: Questionable post procedure pancreatitis  Plan: Needs  IV fluids and I will order to get it started but allow hospital team to adjust as needed and repeat labs tomorrow we will check on tomorrow hopefully she will be doing better  [facility]  office [phone] After 5PM or if no answer call [phone]

## 2021-10-30 NOTE — Progress Notes (Signed)
PT Cancellation Note  Patient Details Name: Debra Brooks MRN: [id number] DOB: Oct 11, 1925   Cancelled Treatment:    Reason Eval/Treat Not Completed: Pain limiting ability to participate having quite a bit of stomach pain and bloating after procedure yesterday, RN aware and about to give pain meds. Will return later this AM.   Ann Lions PT, DPT, PN2   Supplemental Physical Therapist [place]    Pager [phone] Acute Rehab Office [phone]

## 2021-10-30 NOTE — Progress Notes (Signed)
Physical Therapy Treatment Patient Details Name: Debra Brooks MRN: [id number] DOB: 1924-12-22 Today's Date: 10/30/2021   History of Present Illness Debra Brooks is a 88 y.o. female with medical history significant of anemia, anxiety, atrial flutter status post ablation, status post breast cancer in 2004, CAD, chronic combined systolic diastolic heart failure, CKD stage III, chronic venous insufficiency, HLD, HTN GERD, right bundle branch block, hypothyroidism. 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    PT Comments    Patient received in bed, now agreeable to participate in PT. Did need MinA as well as lots of extra time for mobility today due to pain, also fear of increasing pain after procedure yesterday. Not able to tolerate as much activity today due to pain, but steady with rollator when using it correctly. Left up in recliner with all needs met. Will plan to keep her on caseload and update reccs as appropriate moving forward, seemed to have a bit harder time with mobility today.     Recommendations for follow up therapy are one component of a multi-disciplinary discharge planning process, led by the attending physician.  Recommendations may be updated based on patient status, additional functional criteria and insurance authorization.  Follow Up Recommendations  No PT follow up     Assistance Recommended at Discharge Intermittent Supervision/Assistance  Equipment Recommendations  None recommended by PT    Recommendations for Other Services       Precautions / Restrictions Precautions Precautions: Fall Restrictions Weight Bearing Restrictions: No     Mobility  Bed Mobility Overal bed mobility: Modified Independent             General bed mobility comments: increased time and effort HOB elevated    Transfers Overall transfer level: Needs assistance Equipment used: Rollator (4 wheels) Transfers: Sit to/from Stand Sit to  Stand: Min assist           General transfer comment: MinA to boost to standing today due to stomach pain    Ambulation/Gait Ambulation/Gait assistance: Min guard Gait Distance (Feet): 120 Feet Assistive device: Rollator (4 wheels) Gait Pattern/deviations: Decreased step length - right;Decreased step length - left;Decreased dorsiflexion - right;Decreased dorsiflexion - left Gait velocity: decreased     General Gait Details: went a little less far today due to pain, needed frequent cues/redirection due to letting go of rollator and grabbing onto counter top or bedframe   Stairs             Wheelchair Mobility    Modified Rankin (Stroke Patients Only)       Balance Overall balance assessment: Mild deficits observed, not formally tested                                          Cognition Arousal/Alertness: Awake/alert Behavior During Therapy: [facility] for tasks assessed/performed;Anxious Overall Cognitive Status: Within Functional Limits for tasks assessed                                          Exercises      General Comments        Pertinent Vitals/Pain Pain Assessment: Faces Faces Pain Scale: Hurts little more Pain Location: stomach Pain Descriptors / Indicators: Aching;Discomfort Pain Intervention(s): Limited activity within patient's tolerance;Monitored during  session;Premedicated before session    Home Living                          Prior Function            PT Goals (current goals can now be found in the care plan section) Acute Rehab PT Goals Patient Stated Goal: return to her ILF PT Goal Formulation: With patient Time For Goal Achievement: 11/11/21 Potential to Achieve Goals: Good Progress towards PT goals: Progressing toward goals (slowly, pain limited today)    Frequency    Min 3X/week      PT Plan Current plan remains appropriate    Co-evaluation              AM-PAC PT "6  Clicks" Mobility   Outcome Measure  Help needed turning from your back to your side while in a flat bed without using bedrails?: None Help needed moving from lying on your back to sitting on the side of a flat bed without using bedrails?: None Help needed moving to and from a bed to a chair (including a wheelchair)?: A Little Help needed standing up from a chair using your arms (e.g., wheelchair or bedside chair)?: A Little Help needed to walk in hospital room?: A Little Help needed climbing 3-5 steps with a railing? : A Little 6 Click Score: 20    End of Session Equipment Utilized During Treatment: Gait belt Activity Tolerance: Patient tolerated treatment well Patient left: in chair;with call bell/phone within reach Nurse Communication: Mobility status PT Visit Diagnosis: Muscle weakness (generalized) (M62.81);Difficulty in walking, not elsewhere classified (R26.2)     Time: 1610-9604 PT Time Calculation (min) (ACUTE ONLY): 20 min  Charges:  $Gait Training: 8-22 mins                    Windell Norfolk, DPT, PN2   Supplemental Physical Therapist [place]    Pager [phone] Acute Rehab Office [phone]

## 2021-10-31 ENCOUNTER — Encounter (HOSPITAL_COMMUNITY): Payer: Self-pay | Admitting: Gastroenterology

## 2021-10-31 DIAGNOSIS — K831 Obstruction of bile duct: Secondary | ICD-10-CM | POA: Diagnosis not present

## 2021-10-31 LAB — CBC
HCT: 40.1 % (ref 36.0–46.0)
Hemoglobin: 13.1 g/dL (ref 12.0–15.0)
MCH: 32.5 pg (ref 26.0–34.0)
MCHC: 32.7 g/dL (ref 30.0–36.0)
MCV: 99.5 fL (ref 80.0–100.0)
Platelets: 214 10*3/uL (ref 150–400)
RBC: 4.03 MIL/uL (ref 3.87–5.11)
RDW: 18.4 % — ABNORMAL HIGH (ref 11.5–15.5)
WBC: 15.3 10*3/uL — ABNORMAL HIGH (ref 4.0–10.5)
nRBC: 0 % (ref 0.0–0.2)

## 2021-10-31 LAB — BASIC METABOLIC PANEL
Anion gap: 5 (ref 5–15)
BUN: 38 mg/dL — ABNORMAL HIGH (ref 8–23)
CO2: 19 mmol/L — ABNORMAL LOW (ref 22–32)
Calcium: 8.7 mg/dL — ABNORMAL LOW (ref 8.9–10.3)
Chloride: 113 mmol/L — ABNORMAL HIGH (ref 98–111)
Creatinine, Ser: 1.4 mg/dL — ABNORMAL HIGH (ref 0.44–1.00)
GFR, Estimated: 35 mL/min — ABNORMAL LOW (ref >60)
Glucose, Bld: 139 mg/dL — ABNORMAL HIGH (ref 70–99)
Potassium: 5.4 mmol/L — ABNORMAL HIGH (ref 3.5–5.1)
Sodium: 137 mmol/L (ref 135–145)

## 2021-10-31 LAB — COMPREHENSIVE METABOLIC PANEL
ALT: 44 U/L (ref 0–44)
AST: 51 U/L — ABNORMAL HIGH (ref 15–41)
Albumin: 2.7 g/dL — ABNORMAL LOW (ref 3.5–5.0)
Alkaline Phosphatase: 157 U/L — ABNORMAL HIGH (ref 38–126)
Anion gap: 10 (ref 5–15)
BUN: 40 mg/dL — ABNORMAL HIGH (ref 8–23)
CO2: 20 mmol/L — ABNORMAL LOW (ref 22–32)
Calcium: 8.9 mg/dL (ref 8.9–10.3)
Chloride: 106 mmol/L (ref 98–111)
Creatinine, Ser: 1.55 mg/dL — ABNORMAL HIGH (ref 0.44–1.00)
GFR, Estimated: 31 mL/min — ABNORMAL LOW (ref >60)
Glucose, Bld: 161 mg/dL — ABNORMAL HIGH (ref 70–99)
Potassium: 5.4 mmol/L — ABNORMAL HIGH (ref 3.5–5.1)
Sodium: 136 mmol/L (ref 135–145)
Total Bilirubin: 6.8 mg/dL — ABNORMAL HIGH (ref 0.3–1.2)
Total Protein: 5.5 g/dL — ABNORMAL LOW (ref 6.5–8.1)

## 2021-10-31 LAB — LIPASE, BLOOD: Lipase: 510 U/L — ABNORMAL HIGH (ref 11–51)

## 2021-10-31 MED ORDER — SODIUM ZIRCONIUM CYCLOSILICATE 5 G PO PACK
5.0000 g | PACK | Freq: Once | ORAL | Status: AC
  Administered 2021-10-31: 5 g via ORAL
  Filled 2021-10-31 (×2): qty 1

## 2021-10-31 MED ORDER — LACTATED RINGERS IV SOLN: INTRAVENOUS | Status: DC

## 2021-10-31 MED ORDER — PANTOPRAZOLE SODIUM 40 MG PO TBEC
40.0000 mg | DELAYED_RELEASE_TABLET | Freq: Every day | ORAL | Status: DC
  Administered 2021-10-31 – 2021-11-04 (×5): 40 mg via ORAL
  Filled 2021-10-31 (×5): qty 1

## 2021-10-31 MED ORDER — SODIUM ZIRCONIUM CYCLOSILICATE 10 G PO PACK
10.0000 g | PACK | Freq: Once | ORAL | Status: AC
  Administered 2021-10-31: 10 g via ORAL
  Filled 2021-10-31: qty 1

## 2021-10-31 MED ORDER — HYDROMORPHONE HCL 1 MG/ML IJ SOLN
0.5000 mg | INTRAMUSCULAR | Status: DC | PRN
Start: 2021-10-31 — End: 2021-11-03

## 2021-10-31 MED ORDER — SODIUM CHLORIDE 0.9 % IV SOLN: INTRAVENOUS | Status: DC

## 2021-10-31 NOTE — Progress Notes (Signed)
Report given and care turned over to [facility] at this time.

## 2021-10-31 NOTE — Progress Notes (Signed)
Date and time results received: 10/31/21 0650 (use   Test:  Critical Value: Potassium level 5.4  Name of Provider Notified: Quinn Plowman NP  Orders Received? Or Actions Taken?: Lokelma 5g oral

## 2021-10-31 NOTE — Progress Notes (Signed)
PROGRESS NOTE    Debra Brooks  TZG:[id number] DOB: 1925-05-08 DOA: 10/27/2021 PCP: Royal Hawthorn, NP   Chief Complaint  Patient presents with   Jaundice  Brief Narrative/Hospital Course: Debra Brooks, 88 y.o. female with PMH of chronic anemia, anxiety, atrial fibrillation status post ablation on Eliquis, breast cancer, CAD, chronic combined systolic and diastolic CHF, CKD stage III, chronic venous insufficiency, HTN, GERD, HLD, hypothyroidism, and RBBB presented with jaundice of 2 to 3 days ultrasound showing intra and extrahepatic biliary dilatation, MRCP showed pancreatic neck mass, obstructing the common bile duct extensive atherosclerotic disease with a stent at the origin of SMA and celiac artery.  Patient had undergone ERCP with palliative biliary stent 10/29/21, and also had postoperative pancreatitis electrolyte imbalance AKI. She is being followed closely by GI   Subjective: Seen and examined this morning.  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  Cytology pending.total bili LFTs downtrending.  Care continue supportive  Postoperative pancreatitis: Lipase LFTs downtrending, continue IV fluids clear liquid diet advance as tolerated continue pain control, supportive care.  Continue morphine and hydrocodone for pain control if remains poorly controlled consider changing to IV Dilaudid low-dose-discussed  w. Son  Hyperkalemia: In the setting of AKI, downtrending continue IV fluids.  Repeat BMP later today Hypokalemia resolved Recent Labs  Lab 10/28/21 0625  10/28/21 1238 10/29/21 0603 10/30/21 0558 10/31/21 0455  K 2.5* 3.0* 4.4 5.8* 5.4*   AKI on CKD stage IIIa Metabolic acidosis 2/2 aki: -Baseline creatinine from 0.7-1.0.  Creatinine uptrending encourage oral intake.  Continue IV hydration at 125 mill per liter normal saline Recent Labs  Lab 10/27/21 2026 10/28/21 0625 10/28/21 1238 10/29/21 0603 10/30/21 0558 10/31/21 0455  NA  --  136 135 138 132* 136  K  --  2.5* 3.0* 4.4 5.8* 5.4*  CL  --  103 100 110 105 106  CO2  --  24 26 21* 17* 20*  GLUCOSE  --  93 119* 93 196* 161*  BUN  --  23 24* 22 37* 40*  CREATININE  --  0.76 0.96 0.75 1.39* 1.55*  CALCIUM  --  9.4 9.6 10.1 10.0 8.9  MG 1.9 1.9  --  2.1  --   --   PHOS 3.2 3.7  --   --   --   --      Hypertension: BP stable  Chronic combined systolic and diastolic CHF with EF 75-91% in 2021.  Monitor fluid status.  Permanent A. fib Eliquis on hold in the setting of biliary intervention on Coreg and digoxin.  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  DVT prophylaxis: Place and maintain sequential compression device Start: 10/30/21 1426 SCDs Start: 10/27/21 2006 Code Status:   Code Status: DNR Family Communication: plan of care discussed  with patient and her son Status is: Inpatient Remains inpatient appropriate because: For ongoing management of abdominal pain pancreatitis Disposition: Currently not medically stable for discharge. Anticipated Disposition: TBD   Objective: Vitals last 24 hrs: Vitals:   10/30/21 2118 10/31/21 0446 10/31/21 0827 10/31/21 1015  BP: (!) 145/61 (!) 159/82 (!) 149/69   Pulse: 61 74 80 82  Resp:  19 17 16    Temp: 98.2 F (36.8 C) 97.7 F (36.5 C) 98.8 F (37.1 C)   TempSrc: Oral Oral Oral   SpO2: 94% 95% 94%   Weight:      Height:       Weight change:   Intake/Output Summary (Last 24 hours) at 10/31/2021 1145 Last data filed at 10/31/2021 1143 Gross per 24 hour  Intake 2477.16 ml  Output --  Net 2477.16 ml   Net IO Since Admission: 4,156.57 mL [10/31/21 1145]   Physical Examination: General exam: Alert awake in pain elderly frail HEENT:Oral mucosa moist, Ear/Nose WNL grossly,dentition normal. Respiratory system: B/l clear BS, no use of accessory muscle, non tender. Cardiovascular system: S1 & S2 +,No JVD. Gastrointestinal system: Abdomen soft, tenderness in the mid abdomen,ND, BS+. Nervous System:Alert, awake, moving extremities. Extremities: edema none, distal peripheral pulses palpable.  Skin: No rashes, no icterus. MSK: Normal muscle bulk, tone, power.  Medications reviewed:  Scheduled Meds:  carvedilol  6.25 mg Oral BID WC   digoxin  0.0625 mg Oral Daily   levothyroxine  75 mcg Oral Q0600   melatonin  10 mg Oral QHS   pantoprazole  40 mg Oral QAC supper   sodium bicarbonate  325 mg Oral TID   Continuous Infusions:  sodium chloride     Diet Order             Diet clear liquid Room service appropriate? Yes; Fluid consistency: Thin  Diet effective now                          Weight change:   Wt Readings from Last 3 Encounters:  10/29/21 57.2 kg  10/26/21 58.4 kg  07/26/21 59.4 kg     Consultants:see note  Procedures:see note Antimicrobials: Anti-infectives (From admission, onward)    Start     Dose/Rate Route Frequency Ordered Stop   10/29/21 1215  ceFAZolin (ANCEF) IVPB 2g/100 mL premix        2 g 200 mL/hr over 30 Minutes Intravenous  Once 10/29/21 1214 10/29/21 1243      Culture/Microbiology    Component Value Date/Time   SDES BLOOD RIGHT FOREARM 10/31/2017 0630   SPECREQUEST  10/31/2017 0630    BOTTLES DRAWN AEROBIC AND  ANAEROBIC Blood Culture adequate volume   CULT NO GROWTH 5 DAYS 10/31/2017 0630   REPTSTATUS 11/05/2017 FINAL 10/31/2017 0630    Other culture-see note  Unresulted Labs (From admission, onward)     Start     Ordered   11/01/21 0500  Comprehensive metabolic panel  Daily,   R      10/31/21 0827   11/01/21 0500  CBC  Daily,   R      10/31/21 0827   11/01/21 0500  Lipase, blood  Tomorrow morning,   R        10/31/21 0827   10/31/21 1017  Basic metabolic panel  Once-Timed,   TIMED       Question:  Specimen collection method  Answer:  Lab=Lab collect   10/31/21 1141  Data Reviewed: I have personally reviewed following labs and imaging studies CBC: Recent Labs  Lab 10/26/21 1614 10/27/21 1202 10/28/21 0625 10/29/21 0603 10/30/21 0558 10/31/21 0455  WBC 6.6 5.9 5.8 6.1 11.5* 15.3*  NEUTROABS 4,957 4.7 4.2  --   --   --   HGB 13.0 12.5 11.0* 11.3* 13.2 13.1  HCT 38.3 37.6 32.8* 33.4* 40.3 40.1  MCV 93.9 93.8 96.2 96.0 97.1 99.5  PLT 242 227 201 207 246 433   Basic Metabolic Panel: Recent Labs  Lab 10/27/21 2026 10/28/21 0625 10/28/21 1238 10/29/21 0603 10/30/21 0558 10/31/21 0455  NA  --  136 135 138 132* 136  K  --  2.5* 3.0* 4.4 5.8* 5.4*  CL  --  103 100 110 105 106  CO2  --  24 26 21* 17* 20*  GLUCOSE  --  93 119* 93 196* 161*  BUN  --  23 24* 22 37* 40*  CREATININE  --  0.76 0.96 0.75 1.39* 1.55*  CALCIUM  --  9.4 9.6 10.1 10.0 8.9  MG 1.9 1.9  --  2.1  --   --   PHOS 3.2 3.7  --   --   --   --    GFR: Estimated Creatinine Clearance: 20 mL/min (A) (by C-G formula based on SCr of 1.55 mg/dL (H)). Liver Function Tests: Recent Labs  Lab 10/27/21 1202 10/28/21 0625 10/29/21 0603 10/30/21 0558 10/31/21 0455  AST 73* 69* 68* 83* 51*  ALT 105* 89* 86* 83* 44  ALKPHOS 171* 157* 169* 206* 157*  BILITOT 9.3* 8.3* 9.3* 9.7* 6.8*  PROT 6.1* 5.4* 5.4* 6.2* 5.5*  ALBUMIN 3.7 2.9* 2.7* 3.2* 2.7*   Recent Labs  Lab 10/27/21 1243 10/30/21 0558  10/31/21 0455  LIPASE 32 1,366* 510*   No results for input(s): AMMONIA in the last 168 hours. Coagulation Profile: No results for input(s): INR, PROTIME in the last 168 hours. Cardiac Enzymes: Recent Labs  Lab 10/27/21 2026  CKTOTAL 60   BNP (last 3 results) No results for input(s): PROBNP in the last 8760 hours. HbA1C: No results for input(s): HGBA1C in the last 72 hours. CBG: No results for input(s): GLUCAP in the last 168 hours. Lipid Profile: No results for input(s): CHOL, HDL, LDLCALC, TRIG, CHOLHDL, LDLDIRECT in the last 72 hours. Thyroid Function Tests: No results for input(s): TSH, T4TOTAL, FREET4, T3FREE, THYROIDAB in the last 72 hours. Anemia Panel: No results for input(s): VITAMINB12, FOLATE, FERRITIN, TIBC, IRON, RETICCTPCT in the last 72 hours. Sepsis Labs: No results for input(s): PROCALCITON, LATICACIDVEN in the last 168 hours.  Recent Results (from the past 240 hour(s))  Resp Panel by RT-PCR (Flu A&B, Covid) Nasopharyngeal Swab     Status: None   Collection Time: 10/27/21  2:42 PM   Specimen: Nasopharyngeal Swab; Nasopharyngeal(NP) swabs in vial transport medium  Result Value Ref Range Status   SARS Coronavirus 2 by RT PCR NEGATIVE NEGATIVE Final    Comment: (NOTE) SARS-CoV-2 target nucleic acids are NOT DETECTED.  The SARS-CoV-2 RNA is generally detectable in upper respiratory specimens during the acute phase of infection. The lowest concentration of SARS-CoV-2 viral copies this assay can detect is 138 copies/mL. A negative result does not preclude SARS-Cov-2 infection and should not be used as the sole basis for treatment or other patient management decisions. A negative result may occur with  improper specimen collection/handling, submission of specimen other than nasopharyngeal swab, presence of viral mutation(s) within the areas targeted by  this assay, and inadequate number of viral copies(<138 copies/mL). A negative result must be combined  with clinical observations, patient history, and epidemiological information. The expected result is Negative.  Fact Sheet for Patients:  EntrepreneurPulse.com.au  Fact Sheet for Healthcare Providers:  IncredibleEmployment.be  This test is no t yet approved or cleared by the [place] FDA and  has been authorized for detection and/or diagnosis of SARS-CoV-2 by FDA under an Emergency Use Authorization (EUA). This EUA will remain  in effect (meaning this test can be used) for the duration of the COVID-19 declaration under Section 564(b)(1) of the Act, 21 U.S.C.section 360bbb-3(b)(1), unless the authorization is terminated  or revoked sooner.       Influenza A by PCR NEGATIVE NEGATIVE Final   Influenza B by PCR NEGATIVE NEGATIVE Final    Comment: (NOTE) The Xpert Xpress SARS-CoV-2/FLU/RSV plus assay is intended as an aid in the diagnosis of influenza from Nasopharyngeal swab specimens and should not be used as a sole basis for treatment. Nasal washings and aspirates are unacceptable for Xpert Xpress SARS-CoV-2/FLU/RSV testing.  Fact Sheet for Patients: EntrepreneurPulse.com.au  Fact Sheet for Healthcare Providers: IncredibleEmployment.be  This test is not yet approved or cleared by the [place] FDA and has been authorized for detection and/or diagnosis of SARS-CoV-2 by FDA under an Emergency Use Authorization (EUA). This EUA will remain in effect (meaning this test can be used) for the duration of the COVID-19 declaration under Section 564(b)(1) of the Act, 21 U.S.C. section 360bbb-3(b)(1), unless the authorization is terminated or revoked.  Performed at [facility], [address], [place], [place] [address]      Radiology Studies: DG ERCP  Result Date: 10/29/2021 CLINICAL DATA:  ERCP EXAM: ERCP TECHNIQUE: Multiple spot images obtained with the fluoroscopic device and  submitted for interpretation post-procedure. FLUOROSCOPY TIME:  Refer to procedure report COMPARISON:  None. FINDINGS: A total of 3 fluoroscopic spot images are submitted for review taken during ERCP. Initial images demonstrate a scope overlying the upper abdomen with what appears to be catheterization of the common bile duct. Final image demonstrate a metallic stent overlying the expected location of the common bile duct, with waisting in the proximal aspect of the CBD. IMPRESSION: Fluoroscopic images taken during ERCP as described. Refer to procedure report for full details. These images were submitted for radiologic interpretation only. Please see the procedural report for the amount of contrast and the fluoroscopy time utilized. Electronically Signed   By: Albin Felling M.D.   On: 10/29/2021 13:29   DG C-Arm 1-60 Min-No Report  Result Date: 10/29/2021 Fluoroscopy was utilized by the requesting physician.  No radiographic interpretation.     LOS: 2 days   Antonieta Pert, MD [facility]  10/31/2021, 11:45 AM

## 2021-10-31 NOTE — Progress Notes (Addendum)
Leslieann B Garms 10:31 AM  Subjective: Patient still with abdominal pain although not as bad as yesterday and some decreased distention and we talked about increasing liquid intake and she has no new complaints and we discussed her pancreatitis from the procedure and I returned later this morning and discussed her case with her son and daughter on the phone and answered all of their questions  Objective: Vital signs stable afebrile no acute distress a little sleepy just waking up abdomen is softer and less distended than yesterday with mild guarding no rebound white count a little worse BUN and creatinine a little worse lipase and liver tests improved  Assessment: Post ERCP pancreatitis  Plan: Encourage liquids continue IV fluids and hopefully this will be short-lived and consider repeat CT in the future if she does not improve over the next few days and await cytology as well and ideally draw CA 19 when pancreatitis is better and liver tests closer to normal  [facility]  office [phone] After 5PM or if no answer call [phone]

## 2021-10-31 NOTE — Progress Notes (Signed)
°   10/31/21 1400  Mobility  Activity Ambulated in hall  Level of Assistance Contact guard assist, steadying assist  Assistive Device Four wheel walker  Distance Ambulated (ft) 180 ft  Mobility Ambulated with assistance in hallway  Mobility Response Tolerated well  Mobility performed by Mobility specialist  $Mobility charge 1 Mobility   Pt requested for second session this afternoon. Ambulated about 174ft in hall with rollator, tolerated well. Took one sitting rest break about halfway. No complaints. Stated her pain was better than this morning. Left pt in bed with call bell at side. RN notified of second session.  [place] [facility] Office: [phone]

## 2021-10-31 NOTE — Progress Notes (Signed)
°   10/31/21 1200  Mobility  Activity Stood at bedside;Dangled on edge of bed  Level of Assistance Minimal assist, patient does 75% or more  Assistive Device Front wheel walker  Minutes Stood 7 minutes  Mobility Response Tolerated well  Mobility performed by Mobility specialist  $Mobility charge 1 Mobility   Pt reluctant to mobilize upon entering. She stated she was in 10/10 pain, localized to her upper right abdomen. However, she was agreeable to attempt standing bedside. RN came in to give pt pain medication prior to start of session. Pt sat EOB for about 2 minutes and then pt stood with RW. After about 5 minutes, pt attempted to march in place for about 2 minutes. Once she felt tired, pt sat back down EOB. Assisted pt back to bed, left her with call bell at side and family present. RN notified of session.  [place] Specialist [facility] Re[facility] Office: [phone]

## 2021-10-31 NOTE — Progress Notes (Signed)
The patient is receiving Protonix by the intravenous route.  Based on criteria approved by the Pharmacy and [place], the medication is being converted to the equivalent oral dose form.  These criteria include: -No active GI bleeding -Able to tolerate diet of full liquids (or better) or tube feeding -Able to tolerate other medications by the oral or enteral route  If you have any questions about this conversion, please contact the Pharmacy Department (phone [phone]).  Thank you.   Minda Ditto PharmD [facility] [phone] 10/31/2021, 10:46 AM

## 2021-11-01 DIAGNOSIS — Z7189 Other specified counseling: Secondary | ICD-10-CM

## 2021-11-01 DIAGNOSIS — Z515 Encounter for palliative care: Secondary | ICD-10-CM | POA: Diagnosis not present

## 2021-11-01 DIAGNOSIS — K831 Obstruction of bile duct: Secondary | ICD-10-CM | POA: Diagnosis not present

## 2021-11-01 LAB — COMPREHENSIVE METABOLIC PANEL
ALT: 33 U/L (ref 0–44)
AST: 43 U/L — ABNORMAL HIGH (ref 15–41)
Albumin: 2.7 g/dL — ABNORMAL LOW (ref 3.5–5.0)
Alkaline Phosphatase: 155 U/L — ABNORMAL HIGH (ref 38–126)
Anion gap: 10 (ref 5–15)
BUN: 35 mg/dL — ABNORMAL HIGH (ref 8–23)
CO2: 20 mmol/L — ABNORMAL LOW (ref 22–32)
Calcium: 8.7 mg/dL — ABNORMAL LOW (ref 8.9–10.3)
Chloride: 108 mmol/L (ref 98–111)
Creatinine, Ser: 1.35 mg/dL — ABNORMAL HIGH (ref 0.44–1.00)
GFR, Estimated: 36 mL/min — ABNORMAL LOW (ref >60)
Glucose, Bld: 68 mg/dL — ABNORMAL LOW (ref 70–99)
Potassium: 4.8 mmol/L (ref 3.5–5.1)
Sodium: 138 mmol/L (ref 135–145)
Total Bilirubin: 6.4 mg/dL — ABNORMAL HIGH (ref 0.3–1.2)
Total Protein: 5.7 g/dL — ABNORMAL LOW (ref 6.5–8.1)

## 2021-11-01 LAB — CBC
HCT: 42.5 % (ref 36.0–46.0)
Hemoglobin: 13.4 g/dL (ref 12.0–15.0)
MCH: 32 pg (ref 26.0–34.0)
MCHC: 31.5 g/dL (ref 30.0–36.0)
MCV: 101.4 fL — ABNORMAL HIGH (ref 80.0–100.0)
Platelets: 245 10*3/uL (ref 150–400)
RBC: 4.19 MIL/uL (ref 3.87–5.11)
RDW: 18.8 % — ABNORMAL HIGH (ref 11.5–15.5)
WBC: 16.6 10*3/uL — ABNORMAL HIGH (ref 4.0–10.5)
nRBC: 0 % (ref 0.0–0.2)

## 2021-11-01 LAB — GLUCOSE, CAPILLARY
Glucose-Capillary: 69 mg/dL — ABNORMAL LOW (ref 70–99)
Glucose-Capillary: 112 mg/dL — ABNORMAL HIGH (ref 70–99)
Glucose-Capillary: 105 mg/dL — ABNORMAL HIGH (ref 70–99)

## 2021-11-01 LAB — CYTOLOGY - NON PAP

## 2021-11-01 LAB — LIPASE, BLOOD: Lipase: 128 U/L — ABNORMAL HIGH (ref 11–51)

## 2021-11-01 MED ORDER — DEXTROSE IN LACTATED RINGERS 5 % IV SOLN: INTRAVENOUS | Status: DC

## 2021-11-01 MED ORDER — BUPROPION HCL ER (XL) 150 MG PO TB24
150.0000 mg | ORAL_TABLET | Freq: Every day | ORAL | Status: DC
  Administered 2021-11-02 – 2021-11-04 (×3): 150 mg via ORAL
  Filled 2021-11-01 (×3): qty 1

## 2021-11-01 MED ORDER — APIXABAN 2.5 MG PO TABS
2.5000 mg | ORAL_TABLET | Freq: Two times a day (BID) | ORAL | Status: DC
  Administered 2021-11-01 – 2021-11-04 (×8): 2.5 mg via ORAL
  Filled 2021-11-01 (×8): qty 1

## 2021-11-01 MED ORDER — ALPRAZOLAM 0.25 MG PO TABS
0.2500 mg | ORAL_TABLET | Freq: Every evening | ORAL | Status: DC | PRN
  Administered 2021-11-02 – 2021-11-03 (×2): 0.25 mg via ORAL
  Filled 2021-11-01 (×2): qty 1
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
## 2021-11-01 NOTE — Progress Notes (Signed)
PROGRESS NOTE   Debra Brooks  DZH:[id number] DOB: [birth year] DOA: 10/27/2021 PCP: Royal Hawthorn, NP  Brief Narrative:   88 year old white female resident of independent living [facility] HFrEF + HFpEF EF 35 to 40% 2019 A flutter status post ablation CHADS2 score >3 on apixaban Remote CAD Breast cancer in remission Ovarian mass 08/2019 right side Prior mesenteric ischemia 2014 Priors stable compression fracture mid thoracic vertebral  Went to PCP secondary to skin turning yellow-ultrasound showed intra extrahepatic biliary duct dilation concerning for obstructing process GI consulted recommending MRCP MRCP showed 2.3 X2.2X 1.9 cm pancreatic mass with biliary obstruction\ 10/29/2021 underwent ERCP sphincterotomy +10 French X 6 cm uncovered metal stent 1/2 developed postprocedure pancreatitis kept on clear liquids  Hospital-Problem based course  Pancreatic neck mass 2.3 X2.2 Biliary obstruction secondary to mass Cytology still pending Bilirubin down from 9-->6 White count remains elevated 16 range since procedure however no overt fever-monitor--could be SIRS from pancreatitis Postprocedure pancreatitis Lipase down from 1356-1 28 Give D5 75 cc/H at this time Allow diet as she tolerates Would discontinue controlled substances and melatonin as a sleepy today which is not usual for her AKI superimposed on CKD 3 A Mild metabolic acidosis Peak creatinine 1.5 Currently trending down continue fluids as above Continue sodium bicarb 325 3 times daily Hypoglycemia this morning Sugar now 68 therefore changed to D5 Continue IV fluid Promote diet if she is able to take Permanent A. fib on Eliquis Combined HF PEF Continue digoxin 0.0625, Coreg 6.25, resume Eliquis 2.5 twice daily CAD prior PAD with intestinal angina and SMA stenosis with stent 2014 Stable at this time Underlying depression Patient resumed on Xanax but at higher dose of 3 times daily 0.5 As result a little sleepy  today therefore cut back dose to 0.25 at bedtime Resume Wellbutrin 150 XL tomorrow morning if more awake Stop melatonin tonight discussed with patient's daughter at the bedside  DVT prophylaxis: eliquis  Code Status: dnr Family Communication: Discussed with patient's daughter at the bedside Marcine Matar she can be reached at [phone] Disposition:  Status is: Inpatient  Remains inpatient appropriate because: Further work-up needs    Consultants:  Gastroenterology Dr. Watt Climes  Procedures: As above  Antimicrobials: None currently   Subjective: Slightly confused keeps apologizing to me cannot follow commands Has not really been eating much sugar was little low this morning but on recheck with D5 she is keeping it up  Objective: Vitals:   10/31/21 1015 10/31/21 1452 10/31/21 2143 11/01/21 0446  BP:  (!) 123/107 129/63 (!) 157/94  Pulse: 82 73 70 95  Resp:  17 14 18   Temp:  97.6 F (36.4 C) (!) 97.5 F (36.4 C) (!) 97.3 F (36.3 C)  TempSrc:  Oral Oral Oral  SpO2:  94% 96% 95%  Weight:      Height:        Intake/Output Summary (Last 24 hours) at 11/01/2021 1040 Last data filed at 11/01/2021 1030 Gross per 24 hour  Intake 2687.6 ml  Output --  Net 2687.6 ml   Filed Weights   10/27/21 1143 10/29/21 1015  Weight: 57.2 kg 57.2 kg    Examination:  Frail cachectic white female no distress EOMI NCAT Neck soft supple CTA B no added sound no rales no rhonchi ROM intact moving 4 limbs equally Abdomen soft nontender no rebound no guarding No lower extremity edema Follows commands   Data Reviewed: personally reviewed   CBC    Component Value Date/Time   WBC 16.6 (  H) 11/01/2021 0515   RBC 4.19 11/01/2021 0515   HGB 13.4 11/01/2021 0515   HGB 11.7 11/24/2019 1408   HGB 12.5 01/17/2012 1107   HCT 42.5 11/01/2021 0515   HCT 36.7 11/24/2019 1408   HCT 38.4 01/17/2012 1107   PLT 245 11/01/2021 0515   PLT 235 11/24/2019 1408   MCV 101.4 (H) 11/01/2021 0515   MCV 97  11/24/2019 1408   MCV 94.8 01/17/2012 1107   MCH 32.0 11/01/2021 0515   MCHC 31.5 11/01/2021 0515   RDW 18.8 (H) 11/01/2021 0515   RDW 12.6 11/24/2019 1408   RDW 14.4 01/17/2012 1107   LYMPHSABS 0.8 10/28/2021 0625   LYMPHSABS 1.4 01/17/2012 1107   MONOABS 0.7 10/28/2021 0625   MONOABS 0.4 01/17/2012 1107   EOSABS 0.1 10/28/2021 0625   EOSABS 0.4 01/17/2012 1107   BASOSABS 0.1 10/28/2021 0625   BASOSABS 0.1 01/17/2012 1107   CMP Latest Ref Rng & Units 11/01/2021 10/31/2021 10/31/2021  Glucose 70 - 99 mg/dL 68(L) 139(H) 161(H)  BUN 8 - 23 mg/dL 35(H) 38(H) 40(H)  Creatinine 0.44 - 1.00 mg/dL 1.35(H) 1.40(H) 1.55(H)  Sodium 135 - 145 mmol/L 138 137 136  Potassium 3.5 - 5.1 mmol/L 4.8 5.4(H) 5.4(H)  Chloride 98 - 111 mmol/L 108 113(H) 106  CO2 22 - 32 mmol/L 20(L) 19(L) 20(L)  Calcium 8.9 - 10.3 mg/dL 8.7(L) 8.7(L) 8.9  Total Protein 6.5 - 8.1 g/dL 5.7(L) - 5.5(L)  Total Bilirubin 0.3 - 1.2 mg/dL 6.4(H) - 6.8(H)  Alkaline Phos 38 - 126 U/L 155(H) - 157(H)  AST 15 - 41 U/L 43(H) - 51(H)  ALT 0 - 44 U/L 33 - 44     Radiology Studies: No results found.   Scheduled Meds:  apixaban  2.5 mg Oral BID   carvedilol  6.25 mg Oral BID WC   digoxin  0.0625 mg Oral Daily   levothyroxine  75 mcg Oral Q0600   melatonin  10 mg Oral QHS   pantoprazole  40 mg Oral QAC supper   sodium bicarbonate  325 mg Oral TID   Continuous Infusions:  dextrose 5% lactated ringers 75 mL/hr at 11/01/21 0808     LOS: 3 days   Time spent: [place], MD [facility] To contact the attending provider between 7A-7P or the covering provider during after hours 7P-7A, please log into the web site [email or web address] and access using universal [place] password for that web site. If you do not have the password, please call the hospital operator.  11/01/2021, 10:40 AM

## 2021-11-01 NOTE — Hospital Course (Signed)
88 year old white female resident of [facility] living [facility] HFrEF + HFpEF EF 35 to 40% 2019 A flutter status post ablation CHADS2 score >3 on apixaban Remote CAD Breast cancer in remission Ovarian mass 08/2019 right side Prior mesenteric ischemia 2014 Priors stable compression fracture mid thoracic vertebral  Went to PCP secondary to skin turning yellow-ultrasound showed intra extrahepatic biliary duct dilation concerning for obstructing process GI consulted recommending MRCP MRCP showed 2.3 X2.2X 1.9 cm pancreatic mass with biliary obstruction\ 10/29/2021 underwent ERCP sphincterotomy +10 French X 6 cm uncovered metal stent 1/2 developed postprocedure pancreatitis kept on clear liquids

## 2021-11-01 NOTE — Progress Notes (Signed)
[facility] Gastroenterology Progress Note  Debra Brooks 88 y.o. 1925/07/09   Subjective: Awake and lying in bed. Daughter at bedside and reports patient tolerated soup for lunch. Denies worsened abdominal pain, N/V. Daughter does feel that she is less alert today.  Objective: Vital signs: Vitals:   11/01/21 0446 11/01/21 1405  BP: (!) 157/94 (!) 153/70  Pulse: 95 73  Resp: 18 14  Temp: (!) 97.3 F (36.3 C) 97.7 F (36.5 C)  SpO2: 95% 95%    Physical Exam: Gen: lethargic, elderly, thin, no acute distress, pleasant HEENT: +icteric sclera CV: RRR Chest: CTA B Abd: diffuse tenderness with guarding, soft, nondistended, +BS Ext: no edema  Lab Results: Recent Labs    10/31/21 1550 11/01/21 0515  NA 137 138  K 5.4* 4.8  CL 113* 108  CO2 19* 20*  GLUCOSE 139* 68*  BUN 38* 35*  CREATININE 1.40* 1.35*  CALCIUM 8.7* 8.7*   Recent Labs    10/31/21 0455 11/01/21 0515  AST 51* 43*  ALT 44 33  ALKPHOS 157* 155*  BILITOT 6.8* 6.4*  PROT 5.5* 5.7*  ALBUMIN 2.7* 2.7*   Recent Labs    10/31/21 0455 11/01/21 0515  WBC 15.3* 16.6*  HGB 13.1 13.4  HCT 40.1 42.5  MCV 99.5 101.4*  PLT 214 245      Assessment/Plan: Post-ERCP pancreatitis - Lipase improving; LFTs without significant change. Continue supportive care and full liquid diet. Cytology from bile duct brushings pending. Will f/u.   Lear Ng 11/01/2021, 2:35 PM  Questions please call [phone] Patient ID: Debra Brooks, female   DOB: [birth year], 88 y.o.   MRN: [id number]

## 2021-11-01 NOTE — Plan of Care (Signed)
°  Problem: Clinical Measurements: Goal: Diagnostic test results will improve Outcome: Progressing   Problem: Activity: Goal: Risk for activity intolerance will decrease Outcome: Progressing   Problem: Nutrition: Goal: Adequate nutrition will be maintained Outcome: Progressing   

## 2021-11-02 DIAGNOSIS — Z515 Encounter for palliative care: Secondary | ICD-10-CM | POA: Diagnosis not present

## 2021-11-02 DIAGNOSIS — K831 Obstruction of bile duct: Secondary | ICD-10-CM | POA: Diagnosis not present

## 2021-11-02 LAB — CBC WITH DIFFERENTIAL/PLATELET
Abs Immature Granulocytes: 0.08 10*3/uL — ABNORMAL HIGH (ref 0.00–0.07)
Basophils Absolute: 0 10*3/uL (ref 0.0–0.1)
Basophils Relative: 0 %
Eosinophils Absolute: 0.1 10*3/uL (ref 0.0–0.5)
Eosinophils Relative: 1 %
HCT: 36.2 % (ref 36.0–46.0)
Hemoglobin: 11.9 g/dL — ABNORMAL LOW (ref 12.0–15.0)
Immature Granulocytes: 1 %
Lymphocytes Relative: 4 %
Lymphs Abs: 0.5 10*3/uL — ABNORMAL LOW (ref 0.7–4.0)
MCH: 32.4 pg (ref 26.0–34.0)
MCHC: 32.9 g/dL (ref 30.0–36.0)
MCV: 98.6 fL (ref 80.0–100.0)
Monocytes Absolute: 1.3 10*3/uL — ABNORMAL HIGH (ref 0.1–1.0)
Monocytes Relative: 9 %
Neutro Abs: 11.9 10*3/uL — ABNORMAL HIGH (ref 1.7–7.7)
Neutrophils Relative %: 85 %
Platelets: 200 10*3/uL (ref 150–400)
RBC: 3.67 MIL/uL — ABNORMAL LOW (ref 3.87–5.11)
RDW: 18.5 % — ABNORMAL HIGH (ref 11.5–15.5)
WBC: 14 10*3/uL — ABNORMAL HIGH (ref 4.0–10.5)
nRBC: 0 % (ref 0.0–0.2)

## 2021-11-02 LAB — COMPREHENSIVE METABOLIC PANEL
ALT: 27 U/L (ref 0–44)
AST: 33 U/L (ref 15–41)
Albumin: 2.2 g/dL — ABNORMAL LOW (ref 3.5–5.0)
Alkaline Phosphatase: 132 U/L — ABNORMAL HIGH (ref 38–126)
Anion gap: 9 (ref 5–15)
BUN: 34 mg/dL — ABNORMAL HIGH (ref 8–23)
CO2: 17 mmol/L — ABNORMAL LOW (ref 22–32)
Calcium: 8.9 mg/dL (ref 8.9–10.3)
Chloride: 110 mmol/L (ref 98–111)
Creatinine, Ser: 1.32 mg/dL — ABNORMAL HIGH (ref 0.44–1.00)
GFR, Estimated: 37 mL/min — ABNORMAL LOW (ref >60)
Glucose, Bld: 112 mg/dL — ABNORMAL HIGH (ref 70–99)
Potassium: 5.1 mmol/L (ref 3.5–5.1)
Sodium: 136 mmol/L (ref 135–145)
Total Bilirubin: 4.7 mg/dL — ABNORMAL HIGH (ref 0.3–1.2)
Total Protein: 4.7 g/dL — ABNORMAL LOW (ref 6.5–8.1)

## 2021-11-02 NOTE — Progress Notes (Signed)
[facility] Gastroenterology Progress Note  Debra Brooks 88 y.o. May 24, 1925   Subjective: Patient awake resting in bed. Not responding appropriately to questions. Son in room.  Objective: Vital signs: Vitals:   11/01/21 2026 11/02/21 0453  BP: 100/82 (!) 157/102  Pulse: 79 94  Resp: 19 19  Temp: 97.9 F (36.6 C) 97.8 F (36.6 C)  SpO2: 96% 95%    Physical Exam: Gen: lethargic, jaundice, thin, no acute distress, pleasant HEENT: +icteric sclera CV: RRR Chest: CTA B Abd: diffuse tenderness with guarding, soft, nondistended, +BS Ext: no edema  Lab Results: Recent Labs    11/01/21 0515 11/02/21 0702  NA 138 136  K 4.8 5.1  CL 108 110  CO2 20* 17*  GLUCOSE 68* 112*  BUN 35* 34*  CREATININE 1.35* 1.32*  CALCIUM 8.7* 8.9   Recent Labs    11/01/21 0515 11/02/21 0702  AST 43* 33  ALT 33 27  ALKPHOS 155* 132*  BILITOT 6.4* 4.7*  PROT 5.7* 4.7*  ALBUMIN 2.7* 2.2*   Recent Labs    11/01/21 0515 11/02/21 0702  WBC 16.6* 14.0*  NEUTROABS  --  11.9*  HGB 13.4 11.9*  HCT 42.5 36.2  MCV 101.4* 98.6  PLT 245 200      Assessment/Plan: Obstructive jaundice with post-ERCP pancreatitis - common bile duct brushing negative for malignancy but sample limited. TB, ALP improving. AST/ALT normal. Son does not want to pursue additional invasive diagnostic testing for her obstructive jaundice and wants to focus on her comfort, which I would agree with. Ok to do a soft diet. Will follow.   Lear Ng 11/02/2021, 11:39 AM  Questions please call [phone] Patient ID: Debra Brooks, female   DOB: [birth year], 88 y.o.   MRN: [id number]
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
## 2021-11-02 NOTE — Progress Notes (Signed)
PROGRESS NOTE   Debra Brooks  QIH:[id number] DOB: 07-Dec-1924 DOA: 10/27/2021 PCP: Royal Hawthorn, NP  Brief Narrative:   88 year [facility] white [facility] HFrEF + HFpEF EF 35 to 40% 2019 A flutter status post ablation CHADS2 score >3 on apixaban Remote CAD Breast cancer in remission Ovarian mass 08/2019 right side Prior mesenteric ischemia 2014 Priors stable compression fracture mid thoracic vertebral  Went to PCP secondary to skin turning yellow-ultrasound showed intra extrahepatic biliary duct dilation concerning for obstructing process GI consulted recommending MRCP MRCP showed 2.3 X2.2X 1.9 cm pancreatic mass with biliary obstruction\ 10/29/2021 underwent ERCP sphincterotomy +10 French X 6 cm uncovered metal stent 1/2 developed postprocedure pancreatitis kept on clear liquids  Hospital-Problem based course  Pancreatic neck mass 2.3 X2.2 Biliary obstruction secondary to mass--- brushings nonconclusive for cancer Cytology still pending-discussed with family that this could still be underlying cancer  family have decided regardless of result they would not work-up or do further aggressive measures Will stop labs Postprocedure pancreatitis Lipase down from 1356-1 28 Give D5 75 cc/H at this time as was hypoglycemic some previously Allow diet as she tolerates and have graduated AKI superimposed on CKD 3 A Mild metabolic acidosis Peak creatinine 1.5--- improving some on fluids Continue sodium bicarb 325 3 times daily Hypoglycemia this morning Sugar now 68 therefore changed to D5 Continue IV fluid Permanent A. fib on Eliquis Combined HF PEF Continue digoxin 0.0625, Coreg 6.25, resume Eliquis 2.5 twice daily CAD prior PAD with intestinal angina and SMA stenosis with stent 2014 Stable at this time Underlying depression Xanax  cut back dose to 0.25 at bedtime Hold Wellbutrin at this time Stop melatonin tonight discussed with patient's daughter  at the bedside  DVT prophylaxis: eliquis  Code Status: dnr Family Communication: Discussed with patient's son Shanon Brow [phone]) at the bedside He is interested in discussion regarding comfort care etc.-he does not wish any further aggressive measures or work-up of what may be a malignancy Disposition:  Status is: Inpatient  Remains inpatient appropriate because: Further work-up needs    Consultants:  Gastroenterology Dr. Watt Climes  Procedures: As above  Antimicrobials: None currently   Subjective:  A little less confused today No chest pain no fever no chills Tolerated minimal diet Can oriented to place and time but becomes confused  Objective: Vitals:   11/01/21 0446 11/01/21 1405 11/01/21 2026 11/02/21 0453  BP: (!) 157/94 (!) 153/70 100/82 (!) 157/102  Pulse: 95 73 79 94  Resp: 18 14 19 19   Temp: (!) 97.3 F (36.3 C) 97.7 F (36.5 C) 97.9 F (36.6 C) 97.8 F (36.6 C)  TempSrc: Oral Oral Oral Oral  SpO2: 95% 95% 96% 95%  Weight:      Height:        Intake/Output Summary (Last 24 hours) at 11/02/2021 1114 Last data filed at 11/02/2021 0352 Gross per 24 hour  Intake 1716.66 ml  Output --  Net 1716.66 ml    Filed Weights   10/27/21 1143 10/29/21 1015  Weight: 57.2 kg 57.2 kg    Examination:  NCAT frail white female slightly icteric No distress S1-S2 no murmur no rub no gallop ROM intact moving 4 limbs equally Slight distention of abdomen no rebound no guarding although a little tender on the left lower quadrant No lower extremity edema Psych euthymic although little anxious   Data Reviewed: personally reviewed   CBC    Component Value Date/Time   WBC 14.0 (H) 11/02/2021 1884  RBC 3.67 (L) 11/02/2021 0702   HGB 11.9 (L) 11/02/2021 0702   HGB 11.7 11/24/2019 1408   HGB 12.5 01/17/2012 1107   HCT 36.2 11/02/2021 0702   HCT 36.7 11/24/2019 1408   HCT 38.4 01/17/2012 1107   PLT 200 11/02/2021 0702   PLT 235 11/24/2019 1408   MCV 98.6 11/02/2021  0702   MCV 97 11/24/2019 1408   MCV 94.8 01/17/2012 1107   MCH 32.4 11/02/2021 0702   MCHC 32.9 11/02/2021 0702   RDW 18.5 (H) 11/02/2021 0702   RDW 12.6 11/24/2019 1408   RDW 14.4 01/17/2012 1107   LYMPHSABS 0.5 (L) 11/02/2021 0702   LYMPHSABS 1.4 01/17/2012 1107   MONOABS 1.3 (H) 11/02/2021 0702   MONOABS 0.4 01/17/2012 1107   EOSABS 0.1 11/02/2021 0702   EOSABS 0.4 01/17/2012 1107   BASOSABS 0.0 11/02/2021 0702   BASOSABS 0.1 01/17/2012 1107   CMP Latest Ref Rng & Units 11/02/2021 11/01/2021 10/31/2021  Glucose 70 - 99 mg/dL 112(H) 68(L) 139(H)  BUN 8 - 23 mg/dL 34(H) 35(H) 38(H)  Creatinine 0.44 - 1.00 mg/dL 1.32(H) 1.35(H) 1.40(H)  Sodium 135 - 145 mmol/L 136 138 137  Potassium 3.5 - 5.1 mmol/L 5.1 4.8 5.4(H)  Chloride 98 - 111 mmol/L 110 108 113(H)  CO2 22 - 32 mmol/L 17(L) 20(L) 19(L)  Calcium 8.9 - 10.3 mg/dL 8.9 8.7(L) 8.7(L)  Total Protein 6.5 - 8.1 g/dL 4.7(L) 5.7(L) -  Total Bilirubin 0.3 - 1.2 mg/dL 4.7(H) 6.4(H) -  Alkaline Phos 38 - 126 U/L 132(H) 155(H) -  AST 15 - 41 U/L 33 43(H) -  ALT 0 - 44 U/L 27 33 -     Radiology Studies: No results found.   Scheduled Meds:  apixaban  2.5 mg Oral BID   buPROPion  150 mg Oral Daily   carvedilol  6.25 mg Oral BID WC   digoxin  0.0625 mg Oral Daily   levothyroxine  75 mcg Oral Q0600   melatonin  10 mg Oral QHS   pantoprazole  40 mg Oral QAC supper   sodium bicarbonate  325 mg Oral TID   Continuous Infusions:     LOS: 4 days   Time spent: 24  Nita Sells, MD [facility] To contact the attending provider between 7A-7P or the covering provider during after hours 7P-7A, please log into the web site [email or web address] and access using universal [place] password for that web site. If you do not have the password, please call the hospital operator.  11/02/2021, 11:14 AM
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
## 2021-11-02 NOTE — Plan of Care (Signed)
  Problem: Nutrition: Goal: Adequate nutrition will be maintained Outcome: Progressing   Problem: Pain Managment: Goal: General experience of comfort will improve Outcome: Progressing   

## 2021-11-02 NOTE — Progress Notes (Signed)
Manufacturing engineer [facility])  Received request from [facility] for family interest in [place].   This Liaison went to bedside and met with family, son and daughter. Hospice and [facility] place services were explained. The son was not quite ready to send his mom to [facility] and explanation of time frame for EOL.   This liaison spoke to bedside RN and Dr. Rowe Pavy. Please feel free to contact us for any further needs. We are happy to help and follow up as needed.   Thank you for this referral.  Clementeen Hoof, BSN, [facility] [phone]

## 2021-11-03 DIAGNOSIS — Z515 Encounter for palliative care: Secondary | ICD-10-CM | POA: Diagnosis not present

## 2021-11-03 DIAGNOSIS — K831 Obstruction of bile duct: Secondary | ICD-10-CM | POA: Diagnosis not present

## 2021-11-03 LAB — COMPREHENSIVE METABOLIC PANEL
ALT: 26 U/L (ref 0–44)
AST: 27 U/L (ref 15–41)
Albumin: 2.2 g/dL — ABNORMAL LOW (ref 3.5–5.0)
Alkaline Phosphatase: 124 U/L (ref 38–126)
Anion gap: 10 (ref 5–15)
BUN: 32 mg/dL — ABNORMAL HIGH (ref 8–23)
CO2: 19 mmol/L — ABNORMAL LOW (ref 22–32)
Calcium: 9.8 mg/dL (ref 8.9–10.3)
Chloride: 108 mmol/L (ref 98–111)
Creatinine, Ser: 1.36 mg/dL — ABNORMAL HIGH (ref 0.44–1.00)
GFR, Estimated: 36 mL/min — ABNORMAL LOW (ref >60)
Glucose, Bld: 86 mg/dL (ref 70–99)
Potassium: 4.9 mmol/L (ref 3.5–5.1)
Sodium: 137 mmol/L (ref 135–145)
Total Bilirubin: 3.9 mg/dL — ABNORMAL HIGH (ref 0.3–1.2)
Total Protein: 5.1 g/dL — ABNORMAL LOW (ref 6.5–8.1)

## 2021-11-03 LAB — CBC
HCT: 38.6 % (ref 36.0–46.0)
Hemoglobin: 12.3 g/dL (ref 12.0–15.0)
MCH: 32 pg (ref 26.0–34.0)
MCHC: 31.9 g/dL (ref 30.0–36.0)
MCV: 100.5 fL — ABNORMAL HIGH (ref 80.0–100.0)
Platelets: 225 10*3/uL (ref 150–400)
RBC: 3.84 MIL/uL — ABNORMAL LOW (ref 3.87–5.11)
RDW: 18.7 % — ABNORMAL HIGH (ref 11.5–15.5)
WBC: 11.7 10*3/uL — ABNORMAL HIGH (ref 4.0–10.5)
nRBC: 0 % (ref 0.0–0.2)

## 2021-11-03 LAB — RESP PANEL BY RT-PCR (FLU A&B, COVID) ARPGX2
Influenza A by PCR: NEGATIVE
Influenza B by PCR: NEGATIVE
SARS Coronavirus 2 by RT PCR: NEGATIVE

## 2021-11-03 MED ORDER — HYDROCODONE-ACETAMINOPHEN 5-325 MG PO TABS
1.0000 | ORAL_TABLET | Freq: Four times a day (QID) | ORAL | Status: DC | PRN
  Administered 2021-11-04: 1 via ORAL
  Filled 2021-11-03: qty 1

## 2021-11-03 MED ORDER — BUPROPION HCL ER (XL) 150 MG PO TB24: 150.0000 mg | ORAL_TABLET | Freq: Every day | ORAL | 0 refills | Status: AC

## 2021-11-03 MED ORDER — PANTOPRAZOLE SODIUM 40 MG PO TBEC: 40.0000 mg | DELAYED_RELEASE_TABLET | Freq: Every day | ORAL | Status: AC

## 2021-11-03 MED ORDER — HYDROCODONE-ACETAMINOPHEN 5-325 MG PO TABS: 1.0000 | ORAL_TABLET | ORAL | 0 refills | Status: DC | PRN

## 2021-11-03 MED ORDER — HYDROMORPHONE HCL 1 MG/ML IJ SOLN: 0.5000 mg | Freq: Four times a day (QID) | INTRAMUSCULAR | Status: DC | PRN

## 2021-11-03 MED ORDER — APIXABAN 2.5 MG PO TABS: 2.5000 mg | ORAL_TABLET | Freq: Two times a day (BID) | ORAL | 0 refills | Status: AC

## 2021-11-03 MED ORDER — ALPRAZOLAM 0.25 MG PO TABS: 0.2500 mg | ORAL_TABLET | Freq: Every day | ORAL | 0 refills | Status: AC

## 2021-11-03 NOTE — Progress Notes (Signed)
Patient seen examined and discussed with family  she was having some abdominal pain today and not really eating and a little less coherent so we will watch overnight if stabilizes can discharge to skilled facility a.m.  We will see in the morning and update the plan depending on how patient does  Verneita Griffes, MD [facility] 4:05 PM
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
## 2021-11-03 NOTE — Progress Notes (Signed)
[facility] ([facility])  Hospital Liaison: RN note         This patient has been referred to our palliative care services in the community.  [facility] will continue to follow for any discharge planning needs and to coordinate continuation of palliative care in the outpatient setting.    If you have questions or need assistance, please call [phone] or contact the hospital Liaison listed on AMION.      Thank you for this referral.         Mary Anne Robertson, RN, CCM  [facility] Hospital Liaison   [phone] 

## 2021-11-03 NOTE — TOC Progression Note (Addendum)
Transition of Care [facility]) - Progression Note    Patient Details  Name: Debra Brooks MRN: [id number] Date of Birth: [birth year]  Transition of Care [facility]) CM/SW Contact  Ardie Mclennan, Marjie Skiff, RN Phone Number: 11/03/2021, 1:05 PM  Clinical Narrative:     Spoke with son Shanon Brow for DC planning. He would like pt to go back to [facility] for awhile until she absolutely needs to go to [facility]. He states that he was not aware that her going to [facility] meant she had a 2 weeks or less prognosis.  Shanon Brow states that starting Saturday patient's daughter [name] would be the family contact [phone])  3 with [place] [facility] [place] who states that pt can come back to the SNF section over the weekend if ready. She states that [facility] should contact the weekend supervisor Junie Panning [phone]) over the weekend. The pt will go to room 155 and the RN can call report to [phone]. [facility] will follow pt at [facility] for palliative services.    Readmission Risk Interventions No flowsheet data found.

## 2021-11-03 NOTE — Progress Notes (Signed)
Physical Therapy Treatment Patient Details Name: Debra Brooks MRN: [id number] DOB: [birth year] Today's Date: 11/03/2021   History of Present Illness Debra Brooks is a 88 y.o. female with medical history significant of anemia, anxiety, atrial flutter status post ablation, status post breast cancer in 2004, CAD, chronic combined systolic diastolic heart failure, CKD stage III, chronic venous insufficiency, HLD, HTN GERD, right bundle branch block, hypothyroidism. 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    PT Comments    Pt had decreased activity tolerance today compared to prior PT session. She ambulated 11' x 2 with rollator, distance limited by fatigue and pain "all over". Applied heating pad to abdomen and notified RN of pt request for pain medication.     Recommendations for follow up therapy are one component of a multi-disciplinary discharge planning process, led by the attending physician.  Recommendations may be updated based on patient status, additional functional criteria and insurance authorization.  Follow Up Recommendations  Skilled nursing-short term rehab (<3 hours/day)     Assistance Recommended at Discharge Frequent or constant Supervision/Assistance  Patient can return home with the following A little help with walking and/or transfers;A lot of help with bathing/dressing/bathroom;Assistance with cooking/housework;Direct supervision/assist for medications management;Direct supervision/assist for financial management;Assist for transportation;Help with stairs or ramp for entrance   Equipment Recommendations  None recommended by PT    Recommendations for Other Services       Precautions / Restrictions Precautions Precautions: Fall Restrictions Weight Bearing Restrictions: No     Mobility  Bed Mobility Overal bed mobility: Needs Assistance Bed Mobility: Sit to Supine;Supine to Sit     Supine to sit: Min assist Sit to  supine: Mod assist   General bed mobility comments: increased time and effort HOB elevated, min A to raise trunk, mod A for BLEs into bed    Transfers Overall transfer level: Needs assistance Equipment used: Rollator (4 wheels) Transfers: Sit to/from Stand Sit to Stand: Mod assist           General transfer comment: mod A to power up from bed and from commode    Ambulation/Gait Ambulation/Gait assistance: Min assist;Min guard Gait Distance (Feet): 22 Feet Assistive device: Rollator (4 wheels) Gait Pattern/deviations: Decreased step length - right;Decreased step length - left;Decreased dorsiflexion - right;Decreased dorsiflexion - left Gait velocity: decreased     General Gait Details: difficulty fully advancing RLE, min A to manage rollator, VCs for safety as pt tends to let go of rollator   Stairs             Wheelchair Mobility    Modified Rankin (Stroke Patients Only)       Balance Overall balance assessment: Mild deficits observed, not formally tested                                          Cognition Arousal/Alertness: Lethargic Behavior During Therapy: Flat affect Overall Cognitive Status: Impaired/Different from baseline Area of Impairment: Safety/judgement;Problem solving                         Safety/Judgement: Decreased awareness of safety;Decreased awareness of deficits   Problem Solving: Slow processing;Decreased initiation;Difficulty sequencing;Requires verbal cues;Requires tactile cues          Exercises      General Comments  Pertinent Vitals/Pain Faces Pain Scale: Hurts little more Breathing: normal Negative Vocalization: none Facial Expression: sad, frightened, frown Body Language: relaxed Consolability: distracted or reassured by voice/touch PAINAD Score: 2 Pain Location: "all over" Pain Descriptors / Indicators: Aching;Discomfort Pain Intervention(s): Limited activity within patient's  tolerance;Monitored during session;Repositioned;Patient requesting pain meds-RN notified;Heat applied (heating pad applied to abdomen)    Home Living                          Prior Function            PT Goals (current goals can now be found in the care plan section) Acute Rehab PT Goals Patient Stated Goal: plan is now SNF at [facility] PT Goal Formulation: With family Time For Goal Achievement: 11/11/21 Potential to Achieve Goals: Fair Progress towards PT goals: Not progressing toward goals - comment (decreased activity tolerance)    Frequency    Min 2X/week      PT Plan Current plan remains appropriate;Frequency needs to be updated    Co-evaluation              AM-PAC PT "6 Clicks" Mobility   Outcome Measure  Help needed turning from your back to your side while in a flat bed without using bedrails?: A Little Help needed moving from lying on your back to sitting on the side of a flat bed without using bedrails?: A Lot Help needed moving to and from a bed to a chair (including a wheelchair)?: A Lot Help needed standing up from a chair using your arms (e.g., wheelchair or bedside chair)?: A Lot Help needed to walk in hospital room?: A Little Help needed climbing 3-5 steps with a railing? : Total 6 Click Score: 13    End of Session Equipment Utilized During Treatment: Gait belt Activity Tolerance: Patient limited by fatigue Patient left: in bed;with call bell/phone within reach;with family/visitor present Nurse Communication: Mobility status PT Visit Diagnosis: Muscle weakness (generalized) (M62.81);Difficulty in walking, not elsewhere classified (R26.2)     Time: 1411-1431 PT Time Calculation (min) (ACUTE ONLY): 20 min  Charges:  $Gait Training: 8-22 mins                    Blondell Reveal Kistler PT 11/03/2021  Acute Rehabilitation Services Pager [phone] Office [phone]

## 2021-11-03 NOTE — Plan of Care (Signed)
  Problem: Activity: Goal: Risk for activity intolerance will decrease Outcome: Progressing   Problem: Pain Managment: Goal: General experience of comfort will improve Outcome: Progressing   

## 2021-11-03 NOTE — Discharge Summary (Signed)
Physician Discharge Summary  Debra Brooks:[id number] DOB: 11-May-1925 DOA: 10/27/2021  PCP: Royal Hawthorn, NP  Admit date: 10/27/2021 Discharge date: 11/03/2021  Time spent: 44 minutes  Recommendations for Outpatient Follow-up:  Patient discharging to skilled facility considered discussion with palliative care and continue goals of care discussions in the outpatient setting if fails to thrive Repeat Chem-12 CBC in about 1 week Liberalize diet to allow calories  Discharge Diagnoses:  MAIN problem for hospitalization   Pancreatic neck mass and postprocedure pancreatitis  Please see below for itemized issues addressed in [facility]- refer to other progress notes for clarity if needed  Discharge Condition: Guarded  Diet recommendation: Full liquids to regular diet if able to tolerate  Filed Weights   10/27/21 1143 10/29/21 1015  Weight: 57.2 kg 57.2 kg    History of present illness:  88 year old white female [facility] HFrEF + HFpEF EF 35 to 40% 2019 A flutter status post ablation CHADS2 score >3 on apixaban Remote CAD Breast cancer in remission Ovarian mass 08/2019 right side Prior mesenteric ischemia 2014 Priors stable compression fracture mid thoracic vertebral   Went to PCP secondary to skin turning yellow-ultrasound showed intra extrahepatic biliary duct dilation concerning for obstructing process GI consulted recommending MRCP MRCP showed 2.3 X2.2X 1.9 cm pancreatic mass with biliary obstruction\ 10/29/2021 underwent ERCP sphincterotomy +10 French X 6 cm uncovered metal stent which was complicated on 1/2 by  postprocedure pancreatitis kept on clear liquids and advance slowly  Hospital Course:  Pancreatic neck mass 2.3 X2.2 Biliary obstruction secondary to mass--- brushings nonconclusive for cancer Cytology still pending-discussed with family that this could still be underlying cancer  family have decided regardless of result they  would not work-up or do further aggressive measures Will stop routine labs can check periodically in the outpatient setting Bilirubin trended downwards as did LFTs Postprocedure pancreatitis Lipase down from 1356-1 28 Patient was kept on D5 as was a little hypoglycemic-this was discontinued Graduate diet to allow as tolerated AKI superimposed on CKD 3 A Mild metabolic acidosis Peak creatinine 1.5--- improved to 1.3 on discharge Continue sodium bicarb 325 3 times daily for now and follow Hypoglycemia this morning Sugar low previously and was placed on IV D5  Resolved to some degree on discharge Permanent A. fib on Eliquis Combined HF PEF Continue digoxin 0.0625, Coreg 6.25, resume Eliquis 2.5 twice daily CAD prior PAD with intestinal angina and SMA stenosis with stent 2014 Stable at this time Underlying depression Xanax cut back dose to 0.25 at bedtime Hold Wellbutrin at this time Stop melatonin tonight discussed with patient's daughter at the bedside  Procedures: ERCP  Consultations: Gastroenterology Palliative care  Discharge Exam: Vitals:   11/02/21 2129 11/03/21 0529  BP: (!) 153/87 (!) 149/77  Pulse: 74 84  Resp: 18 16  Temp: 98 F (36.7 C) (!) 97.3 F (36.3 C)  SpO2: 97% 98%    Subj on day of d/c   Awake, a little less overnight coherent Not really eating much but nursing informed later on in the day that was eating full breakfast  General Exam on discharge  EOMI NCAT frail cachectic white female no distress No icterus no pallor Chest clear no rales rhonchi Abdomen soft no tenderness No lower extremity edema Neuro intact moving all 4 limbs  Discharge Instructions    Allergies as of 11/03/2021       Reactions   Avelox [moxifloxacin Hcl In Nacl] Swelling   Tongue swelling   Moxifloxacin  Anaphylaxis, Other (See Comments)   Tongue thickness and dysarthria   Prolia [denosumab] Other (See Comments)   Arthralgias   Iodinated Contrast Media Other (See  Comments)   Unknown allergy' but it was documented that she did fine and had no problems with the 13 hour prep for CT consisting of prednisone and benadryl before imaging.  Today on 11/06/14, she did the 1 hour emergent prep of prednisone and benadryl 1 hour before testing and after imaging, she had no problems with the IV dye   Iodine Hives   Levaquin [levofloxacin] Nausea Only   Pacerone [amiodarone]    unknown   Valium [diazepam]    unknown        Medication List     STOP taking these medications    furosemide 40 MG tablet Commonly known as: LASIX   potassium chloride SA 20 MEQ tablet Commonly known as: KLOR-CON M   pregabalin 50 MG capsule Commonly known as: LYRICA   PRESERVISION AREDS PO   ursodiol 500 MG tablet Commonly known as: ACTIGALL       TAKE these medications    ALPRAZolam 0.25 MG tablet Commonly known as: XANAX Take 1 tablet (0.25 mg total) by mouth at bedtime.   apixaban 2.5 MG Tabs tablet Commonly known as: ELIQUIS Take 1 tablet (2.5 mg total) by mouth 2 (two) times daily. What changed:  medication strength how much to take   buPROPion 150 MG 24 hr tablet Commonly known as: WELLBUTRIN XL Take 1 tablet (150 mg total) by mouth daily.   carvedilol 6.25 MG tablet Commonly known as: COREG Take one tablet by mouth twice daily with meals (breakfast and supper) What changed: See the new instructions.   digoxin 0.125 MG tablet Commonly known as: LANOXIN Take 0.5 tablets (62.5 mcg total) by mouth daily.   HYDROcodone-acetaminophen 5-325 MG tablet Commonly known as: NORCO/VICODIN Take 1-2 tablets by mouth every 4 (four) hours as needed for moderate pain.   levalbuterol 0.63 MG/3ML nebulizer solution Commonly known as: XOPENEX Take 0.63 mg by nebulization 2 (two) times daily as needed for wheezing or shortness of breath.   Melatonin 10 MG Caps Take 1 tablet by mouth at bedtime.   nitroGLYCERIN 0.4 MG SL tablet Commonly known as:  NITROSTAT TAKE 1 TABLET UNDER TONGUE EVERY 5 MINUTES AS NEEDED FOR CHEST PAIN.   pantoprazole 40 MG tablet Commonly known as: PROTONIX Take 1 tablet (40 mg total) by mouth daily before supper.   sennosides-docusate sodium 8.6-50 MG tablet Commonly known as: SENOKOT-S Take 2 tablets by mouth at bedtime as needed for constipation. What changed: when to take this   Synthroid 75 MCG tablet Generic drug: levothyroxine Take 1 tablet (75 mcg total) by mouth daily.   Vitamin D3 125 MCG (5000 UT) Tabs Take 5,000 Units by mouth in the morning and at bedtime.       Allergies  Allergen Reactions   Avelox [Moxifloxacin Hcl In Nacl] Swelling    Tongue swelling   Moxifloxacin Anaphylaxis and Other (See Comments)    Tongue thickness and dysarthria   Prolia [Denosumab] Other (See Comments)    Arthralgias   Iodinated Contrast Media Other (See Comments)    Unknown allergy' but it was documented that she did fine and had no problems with the 13 hour prep for CT consisting of prednisone and benadryl before imaging.  Today on 11/06/14, she did the 1 hour emergent prep of prednisone and benadryl 1 hour before testing and after imaging, she  had no problems with the IV dye   Iodine Hives   Levaquin [Levofloxacin] Nausea Only   Pacerone [Amiodarone]     unknown   Valium [Diazepam]     unknown      The results of significant diagnostics from this hospitalization (including imaging, microbiology, ancillary and laboratory) are listed below for reference.    Significant Diagnostic Studies: MR 3D Recon At Scanner  Result Date: 10/28/2021 CLINICAL DATA:  Jaundice. EXAM: MRI ABDOMEN WITHOUT AND WITH CONTRAST (INCLUDING MRCP) TECHNIQUE: Multiplanar multisequence MR imaging of the abdomen was performed both before and after the administration of intravenous contrast. Heavily T2-weighted images of the biliary and pancreatic ducts were obtained, and three-dimensional MRCP images were rendered by post  processing. CONTRAST:  32mL GADAVIST GADOBUTROL 1 MMOL/ML IV SOLN COMPARISON:  None. FINDINGS: Lower chest: No acute findings. Hepatobiliary: No focal enhancing liver lesion identified. Gallbladder appears decompressed. There is marked diffuse intrahepatic bile duct dilatation. The common bile duct is dilated measuring up to 1.6 cm in diameter. There is abrupt termination of the common bile duct at the level of the head of pancreas Pancreas: Atrophy of the body and tail of pancreas with diffuse main duct dilatation. The dilated main duct terminates at the level of the pancreatic neck where there is a hypoenhancing mass measuring 2.3 x 2.2 by 1.9 cm, image 21/8 and image 6/9. This mass exhibits mild restricted diffusion, image 62/4. Spleen:  Within normal limits in size and appearance. Adrenals/Urinary Tract: Normal adrenal glands. Multiple small bilateral T2 hyperintense kidney lesions are identified. These measure up to 1 cm. Most of these measure less than 1 cm and are technically too small to reliably characterize. The 1 cm lesion in the upper pole the right kidney, image 21/9 is consistent with a benign cyst. No hydronephrosis identified Stomach/Bowel: Visualized portions within the abdomen are unremarkable. Vascular/Lymphatic: Extensive aortic tortuosity and atherosclerotic disease. No aneurysm. Artifact from metallic stents within the origin of the celiac and SMA noted. No definite signs to suggest tumor encasement of the celiac artery or the superior mesenteric artery. The mass within the pancreas appears to contact but does not encase the extrahepatic portal vein, image 17/33. No adenopathy identified. Other:  There is no ascites or focal fluid collections Musculoskeletal: Scoliosis and degenerative disc disease noted. No suspicious bone lesions identified. IMPRESSION: 1. There is a hypoenhancing mass within the pancreatic neck measuring 2.3 x 2.2 by 1.9 cm. This is highly suspicious for pancreatic  adenocarcinoma. The mass obstructs the common bile duct resulting in marked common bile duct and intrahepatic bile duct dilatation. There is also obstruction of the main pancreatic duct with atrophy of the body and tail of pancreas. The mass appears to contact but does not encase the extrahepatic portal vein. No definite signs to suggest encasement of the celiac or superior mesenteric artery. 2. No convincing evidence for liver or nodal metastasis within the abdomen. 3. Extensive aortic atherosclerotic disease with stents identified at the origin of the SMA and celiac artery. Electronically Signed   By: Kerby Moors M.D.   On: 10/28/2021 05:17   DG ERCP  Result Date: 10/29/2021 CLINICAL DATA:  ERCP EXAM: ERCP TECHNIQUE: Multiple spot images obtained with the fluoroscopic device and submitted for interpretation post-procedure. FLUOROSCOPY TIME:  Refer to procedure report COMPARISON:  None. FINDINGS: A total of 3 fluoroscopic spot images are submitted for review taken during ERCP. Initial images demonstrate a scope overlying the upper abdomen with what appears to be catheterization  of the common bile duct. Final image demonstrate a metallic stent overlying the expected location of the common bile duct, with waisting in the proximal aspect of the CBD. IMPRESSION: Fluoroscopic images taken during ERCP as described. Refer to procedure report for full details. These images were submitted for radiologic interpretation only. Please see the procedural report for the amount of contrast and the fluoroscopy time utilized. Electronically Signed   By: Albin Felling M.D.   On: 10/29/2021 13:29   DG C-Arm 1-60 Min-No Report  Result Date: 10/29/2021 Fluoroscopy was utilized by the requesting physician.  No radiographic interpretation.   MR ABDOMEN MRCP W WO CONTAST  Result Date: 10/28/2021 CLINICAL DATA:  Jaundice. EXAM: MRI ABDOMEN WITHOUT AND WITH CONTRAST (INCLUDING MRCP) TECHNIQUE: Multiplanar multisequence MR  imaging of the abdomen was performed both before and after the administration of intravenous contrast. Heavily T2-weighted images of the biliary and pancreatic ducts were obtained, and three-dimensional MRCP images were rendered by post processing. CONTRAST:  109mL GADAVIST GADOBUTROL 1 MMOL/ML IV SOLN COMPARISON:  None. FINDINGS: Lower chest: No acute findings. Hepatobiliary: No focal enhancing liver lesion identified. Gallbladder appears decompressed. There is marked diffuse intrahepatic bile duct dilatation. The common bile duct is dilated measuring up to 1.6 cm in diameter. There is abrupt termination of the common bile duct at the level of the head of pancreas Pancreas: Atrophy of the body and tail of pancreas with diffuse main duct dilatation. The dilated main duct terminates at the level of the pancreatic neck where there is a hypoenhancing mass measuring 2.3 x 2.2 by 1.9 cm, image 21/8 and image 6/9. This mass exhibits mild restricted diffusion, image 62/4. Spleen:  Within normal limits in size and appearance. Adrenals/Urinary Tract: Normal adrenal glands. Multiple small bilateral T2 hyperintense kidney lesions are identified. These measure up to 1 cm. Most of these measure less than 1 cm and are technically too small to reliably characterize. The 1 cm lesion in the upper pole the right kidney, image 21/9 is consistent with a benign cyst. No hydronephrosis identified Stomach/Bowel: Visualized portions within the abdomen are unremarkable. Vascular/Lymphatic: Extensive aortic tortuosity and atherosclerotic disease. No aneurysm. Artifact from metallic stents within the origin of the celiac and SMA noted. No definite signs to suggest tumor encasement of the celiac artery or the superior mesenteric artery. The mass within the pancreas appears to contact but does not encase the extrahepatic portal vein, image 17/33. No adenopathy identified. Other:  There is no ascites or focal fluid collections Musculoskeletal:  Scoliosis and degenerative disc disease noted. No suspicious bone lesions identified. IMPRESSION: 1. There is a hypoenhancing mass within the pancreatic neck measuring 2.3 x 2.2 by 1.9 cm. This is highly suspicious for pancreatic adenocarcinoma. The mass obstructs the common bile duct resulting in marked common bile duct and intrahepatic bile duct dilatation. There is also obstruction of the main pancreatic duct with atrophy of the body and tail of pancreas. The mass appears to contact but does not encase the extrahepatic portal vein. No definite signs to suggest encasement of the celiac or superior mesenteric artery. 2. No convincing evidence for liver or nodal metastasis within the abdomen. 3. Extensive aortic atherosclerotic disease with stents identified at the origin of the SMA and celiac artery. Electronically Signed   By: Kerby Moors M.D.   On: 10/28/2021 05:17   US ABDOMEN LIMITED RUQ (LIVER/GB)  Result Date: 10/26/2021 CLINICAL DATA:  Jaundice EXAM: ULTRASOUND ABDOMEN LIMITED RIGHT UPPER QUADRANT COMPARISON:  CT 07/30/2019 FINDINGS:  Gallbladder: No shadowing stones. Normal wall thickness. Negative sonographic Murphy. Common bile duct: Diameter: 8.7 mm Liver: Marked intra and extrahepatic biliary dilatation. Echogenicity normal. Negative for mass. Portal vein is patent on color Doppler imaging with normal direction of blood flow towards the liver. Other: None. IMPRESSION: 1. Intra and extrahepatic biliary dilatation is suspicious for obstructing process. Further evaluation with MRCP should be considered. These results will be called to the ordering clinician or representative by the Radiologist Assistant, and communication documented in the PACS or [employer]. Electronically Signed   By: Donavan Foil M.D.   On: 10/26/2021 18:17    Microbiology: Recent Results (from the past 240 hour(s))  Resp Panel by RT-PCR (Flu A&B, Covid) Nasopharyngeal Swab     Status: None   Collection Time: 10/27/21   2:42 PM   Specimen: Nasopharyngeal Swab; Nasopharyngeal(NP) swabs in vial transport medium  Result Value Ref Range Status   SARS Coronavirus 2 by RT PCR NEGATIVE NEGATIVE Final    Comment: (NOTE) SARS-CoV-2 target nucleic acids are NOT DETECTED.  The SARS-CoV-2 RNA is generally detectable in upper respiratory specimens during the acute phase of infection. The lowest concentration of SARS-CoV-2 viral copies this assay can detect is 138 copies/mL. A negative result does not preclude SARS-Cov-2 infection and should not be used as the sole basis for treatment or other patient management decisions. A negative result may occur with  improper specimen collection/handling, submission of specimen other than nasopharyngeal swab, presence of viral mutation(s) within the areas targeted by this assay, and inadequate number of viral copies(<138 copies/mL). A negative result must be combined with clinical observations, patient history, and epidemiological information. The expected result is Negative.  Fact Sheet for Patients:  EntrepreneurPulse.com.au  Fact Sheet for Healthcare Providers:  IncredibleEmployment.be  This test is no t yet approved or cleared by the [place] FDA and  has been authorized for detection and/or diagnosis of SARS-CoV-2 by FDA under an Emergency Use Authorization (EUA). This EUA will remain  in effect (meaning this test can be used) for the duration of the COVID-19 declaration under Section 564(b)(1) of the Act, 21 U.S.C.section 360bbb-3(b)(1), unless the authorization is terminated  or revoked sooner.       Influenza A by PCR NEGATIVE NEGATIVE Final   Influenza B by PCR NEGATIVE NEGATIVE Final    Comment: (NOTE) The Xpert Xpress SARS-CoV-2/FLU/RSV plus assay is intended as an aid in the diagnosis of influenza from Nasopharyngeal swab specimens and should not be used as a sole basis for treatment. Nasal washings and aspirates  are unacceptable for Xpert Xpress SARS-CoV-2/FLU/RSV testing.  Fact Sheet for Patients: EntrepreneurPulse.com.au  Fact Sheet for Healthcare Providers: IncredibleEmployment.be  This test is not yet approved or cleared by the [place] FDA and has been authorized for detection and/or diagnosis of SARS-CoV-2 by FDA under an Emergency Use Authorization (EUA). This EUA will remain in effect (meaning this test can be used) for the duration of the COVID-19 declaration under Section 564(b)(1) of the Act, 21 U.S.C. section 360bbb-3(b)(1), unless the authorization is terminated or revoked.  Performed at [facility], [address] [place], [place] [address]   Resp Panel by RT-PCR (Flu A&B, Covid) Nasopharyngeal Swab     Status: None   Collection Time: 11/03/21 12:10 AM   Specimen: Nasopharyngeal Swab; Nasopharyngeal(NP) swabs in vial transport medium  Result Value Ref Range Status   SARS Coronavirus 2 by RT PCR NEGATIVE NEGATIVE Final    Comment: (NOTE) SARS-CoV-2 target nucleic  acids are NOT DETECTED.  The SARS-CoV-2 RNA is generally detectable in upper respiratory specimens during the acute phase of infection. The lowest concentration of SARS-CoV-2 viral copies this assay can detect is 138 copies/mL. A negative result does not preclude SARS-Cov-2 infection and should not be used as the sole basis for treatment or other patient management decisions. A negative result may occur with  improper specimen collection/handling, submission of specimen other than nasopharyngeal swab, presence of viral mutation(s) within the areas targeted by this assay, and inadequate number of viral copies(<138 copies/mL). A negative result must be combined with clinical observations, patient history, and epidemiological information. The expected result is Negative.  Fact Sheet for Patients:  EntrepreneurPulse.com.au  Fact Sheet  for Healthcare Providers:  IncredibleEmployment.be  This test is no t yet approved or cleared by the [place] FDA and  has been authorized for detection and/or diagnosis of SARS-CoV-2 by FDA under an Emergency Use Authorization (EUA). This EUA will remain  in effect (meaning this test can be used) for the duration of the COVID-19 declaration under Section 564(b)(1) of the Act, 21 U.S.C.section 360bbb-3(b)(1), unless the authorization is terminated  or revoked sooner.       Influenza A by PCR NEGATIVE NEGATIVE Final   Influenza B by PCR NEGATIVE NEGATIVE Final    Comment: (NOTE) The Xpert Xpress SARS-CoV-2/FLU/RSV plus assay is intended as an aid in the diagnosis of influenza from Nasopharyngeal swab specimens and should not be used as a sole basis for treatment. Nasal washings and aspirates are unacceptable for Xpert Xpress SARS-CoV-2/FLU/RSV testing.  Fact Sheet for Patients: EntrepreneurPulse.com.au  Fact Sheet for Healthcare Providers: IncredibleEmployment.be  This test is not yet approved or cleared by the [place] FDA and has been authorized for detection and/or diagnosis of SARS-CoV-2 by FDA under an Emergency Use Authorization (EUA). This EUA will remain in effect (meaning this test can be used) for the duration of the COVID-19 declaration under Section 564(b)(1) of the Act, 21 U.S.C. section 360bbb-3(b)(1), unless the authorization is terminated or revoked.  Performed at [facility], [place] [address] [place], [place] [address]      Labs: Basic Metabolic Panel: Recent Labs  Lab 10/27/21 2026 10/28/21 2671 10/28/21 1238 10/29/21 0603 10/30/21 2458 10/31/21 0455 10/31/21 1550 11/01/21 0515 11/02/21 0702 11/03/21 0539  NA  --  136   < > 138   < > 136 137 138 136 137  K  --  2.5*   < > 4.4   < > 5.4* 5.4* 4.8 5.1 4.9  CL  --  103   < > 110   < > 106 113* 108 110 108  CO2  --   24   < > 21*   < > 20* 19* 20* 17* 19*  GLUCOSE  --  93   < > 93   < > 161* 139* 68* 112* 86  BUN  --  23   < > 22   < > 40* 38* 35* 34* 32*  CREATININE  --  0.76   < > 0.75   < > 1.55* 1.40* 1.35* 1.32* 1.36*  CALCIUM  --  9.4   < > 10.1   < > 8.9 8.7* 8.7* 8.9 9.8  MG 1.9 1.9  --  2.1  --   --   --   --   --   --   PHOS 3.2 3.7  --   --   --   --   --   --   --   --    < > =  values in this interval not displayed.   Liver Function Tests: Recent Labs  Lab 10/30/21 0558 10/31/21 0455 11/01/21 0515 11/02/21 0702 11/03/21 0539  AST 83* 51* 43* 33 27  ALT 83* 44 33 27 26  ALKPHOS 206* 157* 155* 132* 124  BILITOT 9.7* 6.8* 6.4* 4.7* 3.9*  PROT 6.2* 5.5* 5.7* 4.7* 5.1*  ALBUMIN 3.2* 2.7* 2.7* 2.2* 2.2*   Recent Labs  Lab 10/30/21 0558 10/31/21 0455 11/01/21 0515  LIPASE 1,366* 510* 128*   No results for input(s): AMMONIA in the last 168 hours. CBC: Recent Labs  Lab 10/28/21 0625 10/29/21 0603 10/30/21 0558 10/31/21 0455 11/01/21 0515 11/02/21 0702 11/03/21 0539  WBC 5.8   < > 11.5* 15.3* 16.6* 14.0* 11.7*  NEUTROABS 4.2  --   --   --   --  11.9*  --   HGB 11.0*   < > 13.2 13.1 13.4 11.9* 12.3  HCT 32.8*   < > 40.3 40.1 42.5 36.2 38.6  MCV 96.2   < > 97.1 99.5 101.4* 98.6 100.5*  PLT 201   < > 246 214 245 200 225   < > = values in this interval not displayed.   Cardiac Enzymes: Recent Labs  Lab 10/27/21 2026  CKTOTAL 60   BNP: BNP (last 3 results) No results for input(s): BNP in the last 8760 hours.  ProBNP (last 3 results) No results for input(s): PROBNP in the last 8760 hours.  CBG: Recent Labs  Lab 11/01/21 0801 11/01/21 0832 11/01/21 1052  GLUCAP 69* 112* 105*       Signed:  Nita Sells MD   [facility] 11/03/2021, 3:59 PM
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
## 2021-11-03 NOTE — Care Management Important Message (Signed)
Important Message  Patient Details IM Letter placed in Patients room. Name: Debra Brooks MRN: [id number] Date of Birth: [birth year]   Medicare Important Message Given:  Yes     Kerin Salen 11/03/2021, 10:24 AM

## 2021-11-03 NOTE — Progress Notes (Signed)
[facility] Gastroenterology Progress Note  Debra Brooks 88 y.o. 04-25-1925   Subjective: Lying in bed. Not able to tell her daughter what she ate for breakfast. Daughter in room.  Objective: Vital signs: Vitals:   11/02/21 2129 11/03/21 0529  BP: (!) 153/87 (!) 149/77  Pulse: 74 84  Resp: 18 16  Temp: 98 F (36.7 C) (!) 97.3 F (36.3 C)  SpO2: 97% 98%    Physical Exam: Gen: lethargic, elderly, frail, jaundice, no acute distress  HEENT: anicteric sclera CV: RRR Chest: CTA B Abd: upper quadrant tenderness with guarding, soft, nondistended, +BS Ext: no edema  Lab Results: Recent Labs    11/02/21 0702 11/03/21 0539  NA 136 137  K 5.1 4.9  CL 110 108  CO2 17* 19*  GLUCOSE 112* 86  BUN 34* 32*  CREATININE 1.32* 1.36*  CALCIUM 8.9 9.8   Recent Labs    11/02/21 0702 11/03/21 0539  AST 33 27  ALT 27 26  ALKPHOS 132* 124  BILITOT 4.7* 3.9*  PROT 4.7* 5.1*  ALBUMIN 2.2* 2.2*   Recent Labs    11/02/21 0702 11/03/21 0539  WBC 14.0* 11.7*  NEUTROABS 11.9*  --   HGB 11.9* 12.3  HCT 36.2 38.6  MCV 98.6 100.5*  PLT 200 225      Assessment/Plan: Obstructive jaundice due to a malignant-appearing pancreatic mass s/p ERCP with metal stent placement and sphincterotomy with post-ERCP pancreatitis - LFTs improving. Conservative management. Family leaning toward comfort care measures. Will sign off. Call if questions.   Lear Ng 11/03/2021, 1:44 PM  Questions please call [phone] Patient ID: Debra Brooks, female   DOB: 1925-04-19, 88 y.o.   MRN: [id number]

## 2021-11-04 MED ORDER — HYDROCODONE-ACETAMINOPHEN 5-325 MG PO TABS: 1.0000 | ORAL_TABLET | Freq: Four times a day (QID) | ORAL | 0 refills | Status: DC | PRN

## 2021-11-04 NOTE — TOC Transition Note (Signed)
Transition of Care [facility]) - CM/SW Discharge Note   Patient Details  Name: Debra Brooks MRN: [id number] Date of Birth: 10-14-1925  Transition of Care [facility] CM/SW Contact:  Ross Ludwig, LCSW Phone Number: 11/04/2021, 3:11 PM   Clinical Narrative:     Patient to be d/c'ed today to [facility] room 155.  Patient and family agreeable to plans will transport via ems RN to call report to [phone]. Palliative recommended to follow outpatient at SNF.  [facility] is aware of referral for outpatient palliative.    Final next level of care: Skilled Nursing Facility Barriers to Discharge: Barriers Resolved   Patient Goals and CMS Choice Patient states their goals for this hospitalization and ongoing recovery are:: To go to rehab, then return back home. CMS Medicare.gov Compare Post Acute Care list provided to:: Patient Choice offered to / list presented to : Patient  Discharge Placement PASRR number recieved: 11/02/21 Existing PASRR number confirmed : 11/02/21            Patient to be transferred to facility by: [facility] Name of family member notified: Daughter Patti Patient and family notified of of transfer: 11/04/21  Discharge Plan and Services                                     Social Determinants of Health (SDOH) Interventions     Readmission Risk Interventions No flowsheet data found.

## 2021-11-04 NOTE — Discharge Summary (Signed)
Physician Discharge Summary  Debra Brooks KZS:[id number] DOB: [birth year] DOA: 10/27/2021  Patient seen and examined much more awake coherent this morning with spacing out of pain meds-patient will go home on pain regimen but more spaced out every 6 hours-I discussed plan of care with daughter she is stable to go to rehab whenever this is available   PCP: Royal Hawthorn, NP  Admit date: 10/27/2021 Discharge date: 11/04/2021  Time spent: 44 minutes  Recommendations for Outpatient Follow-up:  Patient discharging to skilled facility considered discussion with palliative care and continue goals of care discussions in the outpatient setting if fails to thrive Repeat Chem-12 CBC in about 1 week Liberalize diet to allow calories  Discharge Diagnoses:  MAIN problem for hospitalization   Pancreatic neck mass and postprocedure pancreatitis  Please see below for itemized issues addressed in [facility]- refer to other progress notes for clarity if needed  Discharge Condition: Guarded  Diet recommendation: Full liquids to regular diet if able to tolerate  Filed Weights   10/27/21 1143 10/29/21 1015  Weight: 57.2 kg 57.2 kg    History of present illness:  88 year old white female [facility] HFrEF + HFpEF EF 35 to 40% 2019 A flutter status post ablation CHADS2 score >3 on apixaban Remote CAD Breast cancer in remission Ovarian mass 08/2019 right side Prior mesenteric ischemia 2014 Priors stable compression fracture mid thoracic vertebral   Went to PCP secondary to skin turning yellow-ultrasound showed intra extrahepatic biliary duct dilation concerning for obstructing process GI consulted recommending MRCP MRCP showed 2.3 X2.2X 1.9 cm pancreatic mass with biliary obstruction\ 10/29/2021 underwent ERCP sphincterotomy +10 French X 6 cm uncovered metal stent which was complicated on 1/2 by  postprocedure pancreatitis kept on clear liquids and advance  slowly  Hospital Course:  Pancreatic neck mass 2.3 X2.2 Biliary obstruction secondary to mass--- brushings nonconclusive for cancer Cytology still pending-discussed with family that this could still be underlying cancer  family have decided regardless of result they would not work-up or do further aggressive measures Will stop routine labs can check periodically in the outpatient setting Bilirubin trended downwards as did LFTs Postprocedure pancreatitis Lipase down from 1356-1 28 Patient was kept on D5 as was a little hypoglycemic-this was discontinued Graduate diet to allow as tolerated AKI superimposed on CKD 3 A Mild metabolic acidosis Peak creatinine 1.5--- improved to 1.3 on discharge Continue sodium bicarb 325 3 times daily for now and follow Hypoglycemia this morning Sugar low previously and was placed on IV D5  Resolved to some degree on discharge Permanent A. fib on Eliquis Combined HF PEF Continue digoxin 0.0625, Coreg 6.25, resume Eliquis 2.5 twice daily CAD prior PAD with intestinal angina and SMA stenosis with stent 2014 Stable at this time Underlying depression Xanax cut back dose to 0.25 at bedtime Hold Wellbutrin at this time Stop melatonin tonight discussed with patient's daughter at the bedside  Procedures: ERCP  Consultations: Gastroenterology Palliative care  Discharge Exam: Vitals:   11/04/21 0527 11/04/21 0809  BP: (!) 158/88 (!) 141/73  Pulse: 80 77  Resp: 20 18  Temp: 97.8 F (36.6 C)   SpO2: 95%     Subj on day of d/c   awake doing fair Eating some at the bedside much more coherent understands plan of care--we discussed briefly no further medical work-up for some of her issues   General Exam on discharge  EOMI NCAT frail cachectic white female no distress No icterus no pallor Chest clear no  rales rhonchi Abdomen slightly tender in epigastrium  Discharge Instructions    Allergies as of 11/04/2021       Reactions   Avelox  [moxifloxacin Hcl In Nacl] Swelling   Tongue swelling   Moxifloxacin Anaphylaxis, Other (See Comments)   Tongue thickness and dysarthria   Prolia [denosumab] Other (See Comments)   Arthralgias   Iodinated Contrast Media Other (See Comments)   Unknown allergy' but it was documented that she did fine and had no problems with the 13 hour prep for CT consisting of prednisone and benadryl before imaging.  Today on 11/06/14, she did the 1 hour emergent prep of prednisone and benadryl 1 hour before testing and after imaging, she had no problems with the IV dye   Iodine Hives   Levaquin [levofloxacin] Nausea Only   Pacerone [amiodarone]    unknown   Valium [diazepam]    unknown        Medication List     STOP taking these medications    furosemide 40 MG tablet Commonly known as: LASIX   potassium chloride SA 20 MEQ tablet Commonly known as: KLOR-CON M   pregabalin 50 MG capsule Commonly known as: LYRICA   PRESERVISION AREDS PO   ursodiol 500 MG tablet Commonly known as: ACTIGALL       TAKE these medications    ALPRAZolam 0.25 MG tablet Commonly known as: XANAX Take 1 tablet (0.25 mg total) by mouth at bedtime.   apixaban 2.5 MG Tabs tablet Commonly known as: ELIQUIS Take 1 tablet (2.5 mg total) by mouth 2 (two) times daily. What changed:  medication strength how much to take   buPROPion 150 MG 24 hr tablet Commonly known as: WELLBUTRIN XL Take 1 tablet (150 mg total) by mouth daily.   carvedilol 6.25 MG tablet Commonly known as: COREG Take one tablet by mouth twice daily with meals (breakfast and supper) What changed: See the new instructions.   digoxin 0.125 MG tablet Commonly known as: LANOXIN Take 0.5 tablets (62.5 mcg total) by mouth daily.   HYDROcodone-acetaminophen 5-325 MG tablet Commonly known as: NORCO/VICODIN Take 1-2 tablets by mouth every 6 (six) hours as needed for moderate pain.   levalbuterol 0.63 MG/3ML nebulizer solution Commonly known  as: XOPENEX Take 0.63 mg by nebulization 2 (two) times daily as needed for wheezing or shortness of breath.   Melatonin 10 MG Caps Take 1 tablet by mouth at bedtime.   nitroGLYCERIN 0.4 MG SL tablet Commonly known as: NITROSTAT TAKE 1 TABLET UNDER TONGUE EVERY 5 MINUTES AS NEEDED FOR CHEST PAIN.   pantoprazole 40 MG tablet Commonly known as: PROTONIX Take 1 tablet (40 mg total) by mouth daily before supper.   sennosides-docusate sodium 8.6-50 MG tablet Commonly known as: SENOKOT-S Take 2 tablets by mouth at bedtime as needed for constipation. What changed: when to take this   Synthroid 75 MCG tablet Generic drug: levothyroxine Take 1 tablet (75 mcg total) by mouth daily.   Vitamin D3 125 MCG (5000 UT) Tabs Take 5,000 Units by mouth in the morning and at bedtime.       Allergies  Allergen Reactions   Avelox [Moxifloxacin Hcl In Nacl] Swelling    Tongue swelling   Moxifloxacin Anaphylaxis and Other (See Comments)    Tongue thickness and dysarthria   Prolia [Denosumab] Other (See Comments)    Arthralgias   Iodinated Contrast Media Other (See Comments)    Unknown allergy' but it was documented that she did fine and had  no problems with the 13 hour prep for CT consisting of prednisone and benadryl before imaging.  Today on 11/06/14, she did the 1 hour emergent prep of prednisone and benadryl 1 hour before testing and after imaging, she had no problems with the IV dye   Iodine Hives   Levaquin [Levofloxacin] Nausea Only   Pacerone [Amiodarone]     unknown   Valium [Diazepam]     unknown      The results of significant diagnostics from this hospitalization (including imaging, microbiology, ancillary and laboratory) are listed below for reference.    Significant Diagnostic Studies: MR 3D Recon At Scanner  Result Date: 10/28/2021 CLINICAL DATA:  Jaundice. EXAM: MRI ABDOMEN WITHOUT AND WITH CONTRAST (INCLUDING MRCP) TECHNIQUE: Multiplanar multisequence MR imaging of the  abdomen was performed both before and after the administration of intravenous contrast. Heavily T2-weighted images of the biliary and pancreatic ducts were obtained, and three-dimensional MRCP images were rendered by post processing. CONTRAST:  6mL GADAVIST GADOBUTROL 1 MMOL/ML IV SOLN COMPARISON:  None. FINDINGS: Lower chest: No acute findings. Hepatobiliary: No focal enhancing liver lesion identified. Gallbladder appears decompressed. There is marked diffuse intrahepatic bile duct dilatation. The common bile duct is dilated measuring up to 1.6 cm in diameter. There is abrupt termination of the common bile duct at the level of the head of pancreas Pancreas: Atrophy of the body and tail of pancreas with diffuse main duct dilatation. The dilated main duct terminates at the level of the pancreatic neck where there is a hypoenhancing mass measuring 2.3 x 2.2 by 1.9 cm, image 21/8 and image 6/9. This mass exhibits mild restricted diffusion, image 62/4. Spleen:  Within normal limits in size and appearance. Adrenals/Urinary Tract: Normal adrenal glands. Multiple small bilateral T2 hyperintense kidney lesions are identified. These measure up to 1 cm. Most of these measure less than 1 cm and are technically too small to reliably characterize. The 1 cm lesion in the upper pole the right kidney, image 21/9 is consistent with a benign cyst. No hydronephrosis identified Stomach/Bowel: Visualized portions within the abdomen are unremarkable. Vascular/Lymphatic: Extensive aortic tortuosity and atherosclerotic disease. No aneurysm. Artifact from metallic stents within the origin of the celiac and SMA noted. No definite signs to suggest tumor encasement of the celiac artery or the superior mesenteric artery. The mass within the pancreas appears to contact but does not encase the extrahepatic portal vein, image 17/33. No adenopathy identified. Other:  There is no ascites or focal fluid collections Musculoskeletal: Scoliosis and  degenerative disc disease noted. No suspicious bone lesions identified. IMPRESSION: 1. There is a hypoenhancing mass within the pancreatic neck measuring 2.3 x 2.2 by 1.9 cm. This is highly suspicious for pancreatic adenocarcinoma. The mass obstructs the common bile duct resulting in marked common bile duct and intrahepatic bile duct dilatation. There is also obstruction of the main pancreatic duct with atrophy of the body and tail of pancreas. The mass appears to contact but does not encase the extrahepatic portal vein. No definite signs to suggest encasement of the celiac or superior mesenteric artery. 2. No convincing evidence for liver or nodal metastasis within the abdomen. 3. Extensive aortic atherosclerotic disease with stents identified at the origin of the SMA and celiac artery. Electronically Signed   By: Kerby Moors M.D.   On: 10/28/2021 05:17   DG ERCP  Result Date: 10/29/2021 CLINICAL DATA:  ERCP EXAM: ERCP TECHNIQUE: Multiple spot images obtained with the fluoroscopic device and submitted for interpretation post-procedure. FLUOROSCOPY  TIME:  Refer to procedure report COMPARISON:  None. FINDINGS: A total of 3 fluoroscopic spot images are submitted for review taken during ERCP. Initial images demonstrate a scope overlying the upper abdomen with what appears to be catheterization of the common bile duct. Final image demonstrate a metallic stent overlying the expected location of the common bile duct, with waisting in the proximal aspect of the CBD. IMPRESSION: Fluoroscopic images taken during ERCP as described. Refer to procedure report for full details. These images were submitted for radiologic interpretation only. Please see the procedural report for the amount of contrast and the fluoroscopy time utilized. Electronically Signed   By: Albin Felling M.D.   On: 10/29/2021 13:29   DG C-Arm 1-60 Min-No Report  Result Date: 10/29/2021 Fluoroscopy was utilized by the requesting physician.  No  radiographic interpretation.   MR ABDOMEN MRCP W WO CONTAST  Result Date: 10/28/2021 CLINICAL DATA:  Jaundice. EXAM: MRI ABDOMEN WITHOUT AND WITH CONTRAST (INCLUDING MRCP) TECHNIQUE: Multiplanar multisequence MR imaging of the abdomen was performed both before and after the administration of intravenous contrast. Heavily T2-weighted images of the biliary and pancreatic ducts were obtained, and three-dimensional MRCP images were rendered by post processing. CONTRAST:  35mL GADAVIST GADOBUTROL 1 MMOL/ML IV SOLN COMPARISON:  None. FINDINGS: Lower chest: No acute findings. Hepatobiliary: No focal enhancing liver lesion identified. Gallbladder appears decompressed. There is marked diffuse intrahepatic bile duct dilatation. The common bile duct is dilated measuring up to 1.6 cm in diameter. There is abrupt termination of the common bile duct at the level of the head of pancreas Pancreas: Atrophy of the body and tail of pancreas with diffuse main duct dilatation. The dilated main duct terminates at the level of the pancreatic neck where there is a hypoenhancing mass measuring 2.3 x 2.2 by 1.9 cm, image 21/8 and image 6/9. This mass exhibits mild restricted diffusion, image 62/4. Spleen:  Within normal limits in size and appearance. Adrenals/Urinary Tract: Normal adrenal glands. Multiple small bilateral T2 hyperintense kidney lesions are identified. These measure up to 1 cm. Most of these measure less than 1 cm and are technically too small to reliably characterize. The 1 cm lesion in the upper pole the right kidney, image 21/9 is consistent with a benign cyst. No hydronephrosis identified Stomach/Bowel: Visualized portions within the abdomen are unremarkable. Vascular/Lymphatic: Extensive aortic tortuosity and atherosclerotic disease. No aneurysm. Artifact from metallic stents within the origin of the celiac and SMA noted. No definite signs to suggest tumor encasement of the celiac artery or the superior mesenteric  artery. The mass within the pancreas appears to contact but does not encase the extrahepatic portal vein, image 17/33. No adenopathy identified. Other:  There is no ascites or focal fluid collections Musculoskeletal: Scoliosis and degenerative disc disease noted. No suspicious bone lesions identified. IMPRESSION: 1. There is a hypoenhancing mass within the pancreatic neck measuring 2.3 x 2.2 by 1.9 cm. This is highly suspicious for pancreatic adenocarcinoma. The mass obstructs the common bile duct resulting in marked common bile duct and intrahepatic bile duct dilatation. There is also obstruction of the main pancreatic duct with atrophy of the body and tail of pancreas. The mass appears to contact but does not encase the extrahepatic portal vein. No definite signs to suggest encasement of the celiac or superior mesenteric artery. 2. No convincing evidence for liver or nodal metastasis within the abdomen. 3. Extensive aortic atherosclerotic disease with stents identified at the origin of the SMA and celiac artery. Electronically Signed  By: Kerby Moors M.D.   On: 10/28/2021 05:17   US ABDOMEN LIMITED RUQ (LIVER/GB)  Result Date: 10/26/2021 CLINICAL DATA:  Jaundice EXAM: ULTRASOUND ABDOMEN LIMITED RIGHT UPPER QUADRANT COMPARISON:  CT 07/30/2019 FINDINGS: Gallbladder: No shadowing stones. Normal wall thickness. Negative sonographic Murphy. Common bile duct: Diameter: 8.7 mm Liver: Marked intra and extrahepatic biliary dilatation. Echogenicity normal. Negative for mass. Portal vein is patent on color Doppler imaging with normal direction of blood flow towards the liver. Other: None. IMPRESSION: 1. Intra and extrahepatic biliary dilatation is suspicious for obstructing process. Further evaluation with MRCP should be considered. These results will be called to the ordering clinician or representative by the Radiologist Assistant, and communication documented in the PACS or [employer]. Electronically Signed    By: Donavan Foil M.D.   On: 10/26/2021 18:17    Microbiology: Recent Results (from the past 240 hour(s))  Resp Panel by RT-PCR (Flu A&B, Covid) Nasopharyngeal Swab     Status: None   Collection Time: 10/27/21  2:42 PM   Specimen: Nasopharyngeal Swab; Nasopharyngeal(NP) swabs in vial transport medium  Result Value Ref Range Status   SARS Coronavirus 2 by RT PCR NEGATIVE NEGATIVE Final    Comment: (NOTE) SARS-CoV-2 target nucleic acids are NOT DETECTED.  The SARS-CoV-2 RNA is generally detectable in upper respiratory specimens during the acute phase of infection. The lowest concentration of SARS-CoV-2 viral copies this assay can detect is 138 copies/mL. A negative result does not preclude SARS-Cov-2 infection and should not be used as the sole basis for treatment or other patient management decisions. A negative result may occur with  improper specimen collection/handling, submission of specimen other than nasopharyngeal swab, presence of viral mutation(s) within the areas targeted by this assay, and inadequate number of viral copies(<138 copies/mL). A negative result must be combined with clinical observations, patient history, and epidemiological information. The expected result is Negative.  Fact Sheet for Patients:  EntrepreneurPulse.com.au  Fact Sheet for Healthcare Providers:  IncredibleEmployment.be  This test is no t yet approved or cleared by the [place] FDA and  has been authorized for detection and/or diagnosis of SARS-CoV-2 by FDA under an Emergency Use Authorization (EUA). This EUA will remain  in effect (meaning this test can be used) for the duration of the COVID-19 declaration under Section 564(b)(1) of the Act, 21 U.S.C.section 360bbb-3(b)(1), unless the authorization is terminated  or revoked sooner.       Influenza A by PCR NEGATIVE NEGATIVE Final   Influenza B by PCR NEGATIVE NEGATIVE Final    Comment:  (NOTE) The Xpert Xpress SARS-CoV-2/FLU/RSV plus assay is intended as an aid in the diagnosis of influenza from Nasopharyngeal swab specimens and should not be used as a sole basis for treatment. Nasal washings and aspirates are unacceptable for Xpert Xpress SARS-CoV-2/FLU/RSV testing.  Fact Sheet for Patients: EntrepreneurPulse.com.au  Fact Sheet for Healthcare Providers: IncredibleEmployment.be  This test is not yet approved or cleared by the [place] FDA and has been authorized for detection and/or diagnosis of SARS-CoV-2 by FDA under an Emergency Use Authorization (EUA). This EUA will remain in effect (meaning this test can be used) for the duration of the COVID-19 declaration under Section 564(b)(1) of the Act, 21 U.S.C. section 360bbb-3(b)(1), unless the authorization is terminated or revoked.  Performed at [facility], [address], [place], [place] [address]   Resp Panel by RT-PCR (Flu A&B, Covid) Nasopharyngeal Swab     Status: None   Collection Time: 11/03/21 12:10  AM   Specimen: Nasopharyngeal Swab; Nasopharyngeal(NP) swabs in vial transport medium  Result Value Ref Range Status   SARS Coronavirus 2 by RT PCR NEGATIVE NEGATIVE Final    Comment: (NOTE) SARS-CoV-2 target nucleic acids are NOT DETECTED.  The SARS-CoV-2 RNA is generally detectable in upper respiratory specimens during the acute phase of infection. The lowest concentration of SARS-CoV-2 viral copies this assay can detect is 138 copies/mL. A negative result does not preclude SARS-Cov-2 infection and should not be used as the sole basis for treatment or other patient management decisions. A negative result may occur with  improper specimen collection/handling, submission of specimen other than nasopharyngeal swab, presence of viral mutation(s) within the areas targeted by this assay, and inadequate number of viral copies(<138 copies/mL). A  negative result must be combined with clinical observations, patient history, and epidemiological information. The expected result is Negative.  Fact Sheet for Patients:  EntrepreneurPulse.com.au  Fact Sheet for Healthcare Providers:  IncredibleEmployment.be  This test is no t yet approved or cleared by the [place] FDA and  has been authorized for detection and/or diagnosis of SARS-CoV-2 by FDA under an Emergency Use Authorization (EUA). This EUA will remain  in effect (meaning this test can be used) for the duration of the COVID-19 declaration under Section 564(b)(1) of the Act, 21 U.S.C.section 360bbb-3(b)(1), unless the authorization is terminated  or revoked sooner.       Influenza A by PCR NEGATIVE NEGATIVE Final   Influenza B by PCR NEGATIVE NEGATIVE Final    Comment: (NOTE) The Xpert Xpress SARS-CoV-2/FLU/RSV plus assay is intended as an aid in the diagnosis of influenza from Nasopharyngeal swab specimens and should not be used as a sole basis for treatment. Nasal washings and aspirates are unacceptable for Xpert Xpress SARS-CoV-2/FLU/RSV testing.  Fact Sheet for Patients: EntrepreneurPulse.com.au  Fact Sheet for Healthcare Providers: IncredibleEmployment.be  This test is not yet approved or cleared by the [place] FDA and has been authorized for detection and/or diagnosis of SARS-CoV-2 by FDA under an Emergency Use Authorization (EUA). This EUA will remain in effect (meaning this test can be used) for the duration of the COVID-19 declaration under Section 564(b)(1) of the Act, 21 U.S.C. section 360bbb-3(b)(1), unless the authorization is terminated or revoked.  Performed at [facility], [place] [address], [place], [place] [address]      Labs: Basic Metabolic Panel: Recent Labs  Lab 10/29/21 0603 10/30/21 0558 10/31/21 0455 10/31/21 1550 11/01/21 0515  11/02/21 0702 11/03/21 0539  NA 138   < > 136 137 138 136 137  K 4.4   < > 5.4* 5.4* 4.8 5.1 4.9  CL 110   < > 106 113* 108 110 108  CO2 21*   < > 20* 19* 20* 17* 19*  GLUCOSE 93   < > 161* 139* 68* 112* 86  BUN 22   < > 40* 38* 35* 34* 32*  CREATININE 0.75   < > 1.55* 1.40* 1.35* 1.32* 1.36*  CALCIUM 10.1   < > 8.9 8.7* 8.7* 8.9 9.8  MG 2.1  --   --   --   --   --   --    < > = values in this interval not displayed.    Liver Function Tests: Recent Labs  Lab 10/30/21 0558 10/31/21 0455 11/01/21 0515 11/02/21 0702 11/03/21 0539  AST 83* 51* 43* 33 27  ALT 83* 44 33 27 26  ALKPHOS 206* 157* 155* 132* 124  BILITOT 9.7* 6.8* 6.4* 4.7* 3.9*  PROT 6.2* 5.5* 5.7* 4.7* 5.1*  ALBUMIN 3.2* 2.7* 2.7* 2.2* 2.2*    Recent Labs  Lab 10/30/21 0558 10/31/21 0455 11/01/21 0515  LIPASE 1,366* 510* 128*    No results for input(s): AMMONIA in the last 168 hours. CBC: Recent Labs  Lab 10/30/21 0558 10/31/21 0455 11/01/21 0515 11/02/21 0702 11/03/21 0539  WBC 11.5* 15.3* 16.6* 14.0* 11.7*  NEUTROABS  --   --   --  11.9*  --   HGB 13.2 13.1 13.4 11.9* 12.3  HCT 40.3 40.1 42.5 36.2 38.6  MCV 97.1 99.5 101.4* 98.6 100.5*  PLT 246 214 245 200 225    Cardiac Enzymes: No results for input(s): CKTOTAL, CKMB, CKMBINDEX, TROPONINI in the last 168 hours.  BNP: BNP (last 3 results) No results for input(s): BNP in the last 8760 hours.  ProBNP (last 3 results) No results for input(s): PROBNP in the last 8760 hours.  CBG: Recent Labs  Lab 11/01/21 0801 11/01/21 0832 11/01/21 1052  GLUCAP 69* 112* 105*        Signed:  Nita Sells MD   [facility] 11/04/2021, 9:46 AM

## 2021-11-04 NOTE — Progress Notes (Signed)
Pt discharged to [facility]. Left unit on stretcher pushed by ambulance staff. Son present at time of discharge. Pt left in stable condition. VWilliams,RN.

## 2021-11-06 ENCOUNTER — Encounter: Payer: Self-pay | Admitting: Internal Medicine

## 2021-11-06 ENCOUNTER — Non-Acute Institutional Stay (SKILLED_NURSING_FACILITY): Payer: Medicare Other | Admitting: Internal Medicine

## 2021-11-06 DIAGNOSIS — E039 Hypothyroidism, unspecified: Secondary | ICD-10-CM

## 2021-11-06 DIAGNOSIS — K859 Acute pancreatitis without necrosis or infection, unspecified: Secondary | ICD-10-CM

## 2021-11-06 DIAGNOSIS — R1013 Epigastric pain: Secondary | ICD-10-CM

## 2021-11-06 DIAGNOSIS — I4821 Permanent atrial fibrillation: Secondary | ICD-10-CM

## 2021-11-06 DIAGNOSIS — I5042 Chronic combined systolic (congestive) and diastolic (congestive) heart failure: Secondary | ICD-10-CM

## 2021-11-06 DIAGNOSIS — K5901 Slow transit constipation: Secondary | ICD-10-CM

## 2021-11-06 DIAGNOSIS — N1832 Chronic kidney disease, stage 3b: Secondary | ICD-10-CM

## 2021-11-06 DIAGNOSIS — K8689 Other specified diseases of pancreas: Secondary | ICD-10-CM

## 2021-11-06 MED ORDER — TRAMADOL HCL 50 MG PO TABS
25.0000 mg | ORAL_TABLET | ORAL | 0 refills | Status: DC | PRN
Start: 2021-11-06 — End: 2021-11-09
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
## 2021-11-07 LAB — HEPATIC FUNCTION PANEL
ALT: 40 — AB (ref 7–35)
AST: 45 — AB (ref 13–35)
Alkaline Phosphatase: 155 — AB (ref 25–125)
Bilirubin, Total: 1.9

## 2021-11-07 LAB — BASIC METABOLIC PANEL WITH GFR
BUN: 23 — AB (ref 4–21)
CO2: 21 (ref 13–22)
Chloride: 105 (ref 99–108)
Creatinine: 0.9 (ref 0.5–1.1)
Glucose: 71
Potassium: 4.3 (ref 3.4–5.3)
Sodium: 141 (ref 137–147)

## 2021-11-07 LAB — COMPREHENSIVE METABOLIC PANEL
Albumin: 2.8 — AB (ref 3.5–5.0)
Calcium: 10.8 — AB (ref 8.7–10.7)
Globulin: 1.9

## 2021-11-08 ENCOUNTER — Telehealth: Payer: Self-pay | Admitting: *Deleted

## 2021-11-08 ENCOUNTER — Encounter: Payer: Self-pay | Admitting: Internal Medicine

## 2021-11-08 NOTE — Telephone Encounter (Signed)
Talked to her daughter Discussed about advancing diet slowly. Also Lipase is down in recent labs to 68 Her distension and pain is better. KUB showed Bowel distension and Constipation. She will get suppository today and start on Miralax White count slightly high at 14.3K but no signs of infection Continue to monitor for now. Alyse Low can follow up tomorrow

## 2021-11-08 NOTE — Telephone Encounter (Signed)
Patient daughter, Chong Sicilian, called and stated that she missed a call from you and requesting a callback.  # [phone]

## 2021-11-09 ENCOUNTER — Encounter: Payer: Self-pay | Admitting: Adult Health

## 2021-11-09 ENCOUNTER — Non-Acute Institutional Stay: Payer: Self-pay | Admitting: Family Medicine

## 2021-11-09 ENCOUNTER — Non-Acute Institutional Stay (SKILLED_NURSING_FACILITY): Payer: Medicare Other | Admitting: Adult Health

## 2021-11-09 DIAGNOSIS — R0602 Shortness of breath: Secondary | ICD-10-CM

## 2021-11-09 DIAGNOSIS — K5901 Slow transit constipation: Secondary | ICD-10-CM

## 2021-11-09 DIAGNOSIS — N838 Other noninflammatory disorders of ovary, fallopian tube and broad ligament: Secondary | ICD-10-CM

## 2021-11-09 DIAGNOSIS — R1084 Generalized abdominal pain: Secondary | ICD-10-CM | POA: Diagnosis not present

## 2021-11-09 DIAGNOSIS — D72829 Elevated white blood cell count, unspecified: Secondary | ICD-10-CM

## 2021-11-09 DIAGNOSIS — K8689 Other specified diseases of pancreas: Secondary | ICD-10-CM | POA: Diagnosis not present

## 2021-11-09 DIAGNOSIS — K859 Acute pancreatitis without necrosis or infection, unspecified: Secondary | ICD-10-CM | POA: Diagnosis not present

## 2021-11-09 DIAGNOSIS — I10 Essential (primary) hypertension: Secondary | ICD-10-CM

## 2021-11-09 DIAGNOSIS — I5042 Chronic combined systolic (congestive) and diastolic (congestive) heart failure: Secondary | ICD-10-CM

## 2021-11-09 MED ORDER — POTASSIUM CHLORIDE CRYS ER 10 MEQ PO TBCR: 10.0000 meq | EXTENDED_RELEASE_TABLET | Freq: Every day | ORAL | 0 refills | Status: AC

## 2021-11-09 MED ORDER — FUROSEMIDE 20 MG PO TABS: 20.0000 mg | ORAL_TABLET | Freq: Every day | ORAL | 0 refills | Status: AC

## 2021-11-09 MED ORDER — TRAMADOL HCL 50 MG PO TABS: 25.0000 mg | ORAL_TABLET | ORAL | 0 refills | Status: DC | PRN
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
## 2021-11-10 ENCOUNTER — Non-Acute Institutional Stay (SKILLED_NURSING_FACILITY): Payer: Medicare Other | Admitting: Internal Medicine

## 2021-11-10 DIAGNOSIS — E039 Hypothyroidism, unspecified: Secondary | ICD-10-CM

## 2021-11-10 DIAGNOSIS — K859 Acute pancreatitis without necrosis or infection, unspecified: Secondary | ICD-10-CM

## 2021-11-10 DIAGNOSIS — I10 Essential (primary) hypertension: Secondary | ICD-10-CM

## 2021-11-10 DIAGNOSIS — R1084 Generalized abdominal pain: Secondary | ICD-10-CM

## 2021-11-10 DIAGNOSIS — K8689 Other specified diseases of pancreas: Secondary | ICD-10-CM

## 2021-11-10 DIAGNOSIS — I4821 Permanent atrial fibrillation: Secondary | ICD-10-CM

## 2021-11-10 DIAGNOSIS — K5901 Slow transit constipation: Secondary | ICD-10-CM

## 2021-11-10 DIAGNOSIS — N1832 Chronic kidney disease, stage 3b: Secondary | ICD-10-CM

## 2021-11-10 DIAGNOSIS — I5042 Chronic combined systolic (congestive) and diastolic (congestive) heart failure: Secondary | ICD-10-CM

## 2021-11-11 ENCOUNTER — Encounter: Payer: Self-pay | Admitting: Family Medicine

## 2021-11-11 NOTE — Progress Notes (Signed)
Late entry for 11/09/2021  Care Alignment Note   CC/HPI:  [facility] received a referral to help with symptom management and goals of care for patient following hospitalization for new diagnosis of pancreatic cancer, s/p ERCP with sphincterotomy complicated by development of pancreatitis. Per review of EHR hospital records pt has opted not to pursue further diagnostic testing or treatment.  Visit was made to present Palliative Care Service and nursing indicated that pt had poor oral intake overall but had eaten soup that her daughter had made and brought to her.  She was having increased pain and nausea and facility NP increased her pain meds to improve her comfort.  Per daughter, [name], 3rd in line for [facility] POA, she had requested to speak with this provider before seeing her mother.  [name] states that when initially Palliative Care services were presented to patient at the hospital, the presenter saw pt with visitors in the room one of which was a cousin who was not aware of pt's diagnosis.  She also indicated that her mother was under the impression that she only had 2 weeks left to live because the presenter mentioned [place] stating that she was not currently a candidate for [employer].  Daughter states that pt has previously survived cancer twice, had indicated that she had experienced a good life and was ready for the next chapter but significantly values her independence.  She indicates pt is currently unable to do a lot of her ADLs with recent rapid decline in function.  Patient's other children are very familiar with Palliative and Hospice Care.  Pt's daughter [name] is not as familiar and states she is currently satisfied with the level of care pt is receiving at [facility].  She indicates seeing a role for [facility] involvement as pt moves towards Hospice which the children are all in favor.  She states the patient does not wish to go to SNF level of care even at  [facility] and wants to remain on rehab floor or go back to her independent living apartment.  Advised pt's daughter that Hospice services would be available regardless of location but more than likely pt would require 24/7 care at the final phases of life.  Advised that Hospice can help with CNA for personal care, a nurse 24/7 available by phone to troubleshoot problems and if unsuccessful could make a visit and that there is counseling for pt/family as well as Chaplain services but no one would stay in the home with the patient 24/7.  Explained that if that were the level of care needed then this would likely have to be private pay.  Advised that with Palliative and Hospice services there is a Education officer, museum to help with identification of any community resources.  Daughter, [name] requesting how to know when things are progressing towards end of life.  Advised of use of PPS score, behaviors (not eating/drinking, less alert times with possible confusion/hallucinations and changes in VS/circulation.  Daughter states pt is very fatigued, has talked with her brother, has her sister from [place] coming in and would like the opportunity to introduce Palliative Care and have provider come back next week to talk with patient and daughter. Appointment scheduled for Tuesday November 14, 2021 at 10 am with [facility] [phone] for initial visit with patient.  Advanced Directives Documents (Living Will, Power of Attorney) currently in the EHR documents reviewed. Has Living Will and [facility] POA designating sons and daughter in succession as [facility] POA.  Has the patient discussed their wishes with their family/healthcare power of attorney yes. How much does the family or healthcare power of attorney know about their wishes. Spoke with daughter [name] who is 3rd in line for [facility] POA, visiting from [place].  She states her mother has a living will and [facility] POA which specifies her wishes.  Do not think she was aware of the  patient's signed MOST form in  [facility] from 08-29-2016 and this was not on the chart at the facility.  Reviewed in detail and discussed MOST with daughter who has requested that admission visit for Palliative Care be delayed until next week.    What does the patient/decision maker understand about their medical condition and the natural course of their disease. Both are aware it is progressive and terminal without treatment per daughter [name].  Wish is for comfort based care either there in rehab or in patient's independent living apartment per daughter who states mother does not want to go to skilled care at [facility] nor be removed from her home.    What is the patient/decision maker's biggest fear or concern for the future pain and suffering , becoming a burden to my family, loss physical function, loss of mental function, and loss of independence.  What is the most important goal for this patient should their health condition worsen care focused on comfort .  Current Code status DNR/ MOST from 2017 indicates Do not intubate, limited additional interventions, antibiotics and IV fluids on case by case time limited basis and no feeding tube.  Current code status has been reviewed.  Time spent:30 Minutes, 30 Additional Minutes , and 4:35-5:40 pm

## 2021-11-13 ENCOUNTER — Encounter: Payer: Self-pay | Admitting: Internal Medicine

## 2021-11-13 ENCOUNTER — Non-Acute Institutional Stay (SKILLED_NURSING_FACILITY): Payer: Medicare Other | Admitting: Internal Medicine

## 2021-11-13 DIAGNOSIS — K5901 Slow transit constipation: Secondary | ICD-10-CM

## 2021-11-13 DIAGNOSIS — I10 Essential (primary) hypertension: Secondary | ICD-10-CM

## 2021-11-13 DIAGNOSIS — R1084 Generalized abdominal pain: Secondary | ICD-10-CM | POA: Diagnosis not present

## 2021-11-13 DIAGNOSIS — K8689 Other specified diseases of pancreas: Secondary | ICD-10-CM | POA: Diagnosis not present

## 2021-11-13 DIAGNOSIS — K859 Acute pancreatitis without necrosis or infection, unspecified: Secondary | ICD-10-CM

## 2021-11-13 DIAGNOSIS — R634 Abnormal weight loss: Secondary | ICD-10-CM

## 2021-11-13 DIAGNOSIS — I5042 Chronic combined systolic (congestive) and diastolic (congestive) heart failure: Secondary | ICD-10-CM | POA: Diagnosis not present

## 2021-11-13 LAB — BASIC METABOLIC PANEL
BUN: 13 (ref 4–21)
CO2: 29 — AB (ref 13–22)
Chloride: 100 (ref 99–108)
Creatinine: 0.9 (ref 0.5–1.1)
Glucose: 90
Potassium: 3.4 (ref 3.4–5.3)
Sodium: 139 (ref 137–147)

## 2021-11-13 LAB — HEPATIC FUNCTION PANEL
ALT: 33 (ref 7–35)
AST: 35 (ref 13–35)
Alkaline Phosphatase: 128 — AB (ref 25–125)
Bilirubin, Total: 1.4

## 2021-11-13 LAB — CBC AND DIFFERENTIAL
HCT: 40 (ref 36–46)
Hemoglobin: 12.6 (ref 12.0–16.0)
Platelets: 280 (ref 150–399)
WBC: 9.7

## 2021-11-13 LAB — COMPREHENSIVE METABOLIC PANEL
Albumin: 2.9 — AB (ref 3.5–5.0)
Calcium: 9.5 (ref 8.7–10.7)
Globulin: 1.9

## 2021-11-13 LAB — CBC: RBC: 4.07 (ref 3.87–5.11)
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
## 2021-11-14 ENCOUNTER — Other Ambulatory Visit: Payer: Self-pay | Admitting: Orthopedic Surgery

## 2021-11-14 ENCOUNTER — Encounter: Payer: Self-pay | Admitting: Internal Medicine

## 2021-11-14 DIAGNOSIS — R1084 Generalized abdominal pain: Secondary | ICD-10-CM

## 2021-11-14 MED ORDER — TRAMADOL HCL 50 MG PO TABS: 25.0000 mg | ORAL_TABLET | ORAL | 0 refills | Status: DC | PRN

## 2021-11-15 ENCOUNTER — Other Ambulatory Visit: Payer: Self-pay | Admitting: Internal Medicine

## 2021-11-15 MED ORDER — OXYCODONE HCL 5 MG PO TABS: 2.5000 mg | ORAL_TABLET | Freq: Four times a day (QID) | ORAL | 0 refills | Status: DC | PRN

## 2021-11-16 ENCOUNTER — Encounter: Payer: Self-pay | Admitting: Adult Health

## 2021-11-16 ENCOUNTER — Non-Acute Institutional Stay (SKILLED_NURSING_FACILITY): Payer: Medicare Other | Admitting: Adult Health

## 2021-11-16 DIAGNOSIS — R1084 Generalized abdominal pain: Secondary | ICD-10-CM

## 2021-11-16 DIAGNOSIS — I5041 Acute combined systolic (congestive) and diastolic (congestive) heart failure: Secondary | ICD-10-CM | POA: Diagnosis not present

## 2021-11-16 DIAGNOSIS — K8689 Other specified diseases of pancreas: Secondary | ICD-10-CM

## 2021-11-16 DIAGNOSIS — K5901 Slow transit constipation: Secondary | ICD-10-CM | POA: Diagnosis not present

## 2021-11-16 DIAGNOSIS — R110 Nausea: Secondary | ICD-10-CM

## 2021-11-16 DIAGNOSIS — N838 Other noninflammatory disorders of ovary, fallopian tube and broad ligament: Secondary | ICD-10-CM

## 2021-11-16 MED ORDER — TRAMADOL HCL 50 MG PO TABS: 50.0000 mg | ORAL_TABLET | ORAL | 0 refills | Status: AC

## 2021-11-16 MED ORDER — TRAMADOL HCL 50 MG PO TABS: 25.0000 mg | ORAL_TABLET | ORAL | 0 refills | Status: DC

## 2021-11-16 MED ORDER — SENNA-DOCUSATE SODIUM 8.6-50 MG PO TABS: 2.0000 | ORAL_TABLET | Freq: Every day | ORAL | 3 refills | Status: AC
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
## 2021-11-17 MED ORDER — ONDANSETRON HCL 4 MG PO TABS: 4.0000 mg | ORAL_TABLET | Freq: Every day | ORAL | 0 refills | Status: AC

## 2021-11-17 MED ORDER — ALPRAZOLAM 0.5 MG PO TABS: 0.2500 mg | ORAL_TABLET | ORAL | 0 refills | Status: AC | PRN

## 2021-11-17 MED ORDER — OXYCODONE HCL 5 MG PO TABS: 2.5000 mg | ORAL_TABLET | ORAL | 0 refills | Status: AC | PRN

## 2021-11-26 ENCOUNTER — Other Ambulatory Visit: Payer: Self-pay | Admitting: Orthopedic Surgery

## 2021-11-26 DIAGNOSIS — G609 Hereditary and idiopathic neuropathy, unspecified: Secondary | ICD-10-CM

## 2021-11-27 ENCOUNTER — Encounter: Payer: Medicare Other | Admitting: Adult Health

## 2021-11-27 NOTE — Telephone Encounter (Signed)
Patient has request refill on medication "Lyrica". Medication isnt listed on patient medication list. Medication pend and sent to PCP Royal Hawthorn, NP .

## 2021-11-29 DEATH — deceased

## 2022-01-17 ENCOUNTER — Ambulatory Visit: Payer: Medicare Other | Admitting: Cardiology

## 2022-01-19 ENCOUNTER — Encounter: Payer: Medicare Other | Admitting: Family
